# Patient Record
Sex: Female | Born: 1940
Health system: Southern US, Community
[De-identification: ages and names within clinical notes are randomized; demographics above are authoritative.]

## PROBLEM LIST (undated history)

## (undated) DIAGNOSIS — G4730 Sleep apnea, unspecified: Secondary | ICD-10-CM

## (undated) DIAGNOSIS — L4050 Arthropathic psoriasis, unspecified: Secondary | ICD-10-CM

## (undated) DIAGNOSIS — E7800 Pure hypercholesterolemia, unspecified: Secondary | ICD-10-CM

## (undated) DIAGNOSIS — T884XXA Failed or difficult intubation, initial encounter: Secondary | ICD-10-CM

## (undated) DIAGNOSIS — E079 Disorder of thyroid, unspecified: Secondary | ICD-10-CM

## (undated) DIAGNOSIS — Z9889 Other specified postprocedural states: Secondary | ICD-10-CM

## (undated) DIAGNOSIS — T8859XA Other complications of anesthesia, initial encounter: Secondary | ICD-10-CM

## (undated) DIAGNOSIS — Z96659 Presence of unspecified artificial knee joint: Secondary | ICD-10-CM

## (undated) HISTORY — PX: SPINE SURGERY: SHX786

## (undated) HISTORY — PX: TONSILLECTOMY: SUR1361

## (undated) HISTORY — DX: Pure hypercholesterolemia, unspecified: E78.00

## (undated) HISTORY — PX: FOOT SURGERY: SHX648

## (undated) HISTORY — PX: CARDIAC CATHETERIZATION: SHX172

## (undated) HISTORY — PX: CATARACT EXTRACTION W/ INTRAOCULAR LENS IMPLANT: SHX1309

## (undated) HISTORY — DX: Arthropathic psoriasis, unspecified: L40.50

## (undated) HISTORY — DX: Disorder of thyroid, unspecified: E07.9

## (undated) HISTORY — DX: Presence of unspecified artificial knee joint: Z96.659

## (undated) HISTORY — DX: Other specified postprocedural states: Z98.890

## (undated) HISTORY — PX: TOTAL KNEE ARTHROPLASTY: SHX125

## (undated) HISTORY — DX: Sleep apnea, unspecified: G47.30

---
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
## 1988-10-27 DIAGNOSIS — L4050 Arthropathic psoriasis, unspecified: Secondary | ICD-10-CM

## 1988-10-27 HISTORY — DX: Arthropathic psoriasis, unspecified: L40.50

## 1997-12-20 ENCOUNTER — Ambulatory Visit (HOSPITAL_COMMUNITY): Admission: RE | Admit: 1997-12-20 | Discharge: 1997-12-20 | Payer: Self-pay | Admitting: Obstetrics and Gynecology

## 1998-12-25 ENCOUNTER — Other Ambulatory Visit: Admission: RE | Admit: 1998-12-25 | Discharge: 1998-12-25 | Payer: Self-pay | Admitting: Obstetrics and Gynecology

## 1999-10-28 DIAGNOSIS — Z96659 Presence of unspecified artificial knee joint: Secondary | ICD-10-CM

## 1999-10-28 HISTORY — DX: Presence of unspecified artificial knee joint: Z96.659

## 2000-07-06 ENCOUNTER — Other Ambulatory Visit: Admission: RE | Admit: 2000-07-06 | Discharge: 2000-07-06 | Payer: Self-pay | Admitting: Obstetrics and Gynecology

## 2000-10-15 ENCOUNTER — Ambulatory Visit (HOSPITAL_COMMUNITY): Admission: RE | Admit: 2000-10-15 | Discharge: 2000-10-15 | Payer: Self-pay | Admitting: *Deleted

## 2001-11-01 ENCOUNTER — Other Ambulatory Visit: Admission: RE | Admit: 2001-11-01 | Discharge: 2001-11-01 | Payer: Self-pay | Admitting: Obstetrics and Gynecology

## 2002-10-27 DIAGNOSIS — G4730 Sleep apnea, unspecified: Secondary | ICD-10-CM

## 2002-10-27 HISTORY — DX: Sleep apnea, unspecified: G47.30

## 2007-03-04 DIAGNOSIS — K1120 Sialoadenitis, unspecified: Secondary | ICD-10-CM | POA: Insufficient documentation

## 2010-06-20 DIAGNOSIS — G4733 Obstructive sleep apnea (adult) (pediatric): Secondary | ICD-10-CM | POA: Insufficient documentation

## 2010-06-20 DIAGNOSIS — L4050 Arthropathic psoriasis, unspecified: Secondary | ICD-10-CM | POA: Insufficient documentation

## 2010-06-20 DIAGNOSIS — R6510 Systemic inflammatory response syndrome (SIRS) of non-infectious origin without acute organ dysfunction: Secondary | ICD-10-CM | POA: Insufficient documentation

## 2010-06-20 DIAGNOSIS — L0291 Cutaneous abscess, unspecified: Secondary | ICD-10-CM | POA: Insufficient documentation

## 2010-06-20 DIAGNOSIS — G4730 Sleep apnea, unspecified: Secondary | ICD-10-CM | POA: Insufficient documentation

## 2010-06-20 DIAGNOSIS — E039 Hypothyroidism, unspecified: Secondary | ICD-10-CM | POA: Insufficient documentation

## 2011-07-19 DIAGNOSIS — M47812 Spondylosis without myelopathy or radiculopathy, cervical region: Secondary | ICD-10-CM | POA: Insufficient documentation

## 2011-08-07 DIAGNOSIS — M47817 Spondylosis without myelopathy or radiculopathy, lumbosacral region: Secondary | ICD-10-CM | POA: Insufficient documentation

## 2012-10-04 DIAGNOSIS — M79672 Pain in left foot: Secondary | ICD-10-CM | POA: Insufficient documentation

## 2013-11-02 DIAGNOSIS — H2510 Age-related nuclear cataract, unspecified eye: Secondary | ICD-10-CM | POA: Diagnosis not present

## 2013-12-29 DIAGNOSIS — H18519 Endothelial corneal dystrophy, unspecified eye: Secondary | ICD-10-CM | POA: Diagnosis not present

## 2013-12-29 DIAGNOSIS — M069 Rheumatoid arthritis, unspecified: Secondary | ICD-10-CM | POA: Diagnosis not present

## 2013-12-29 DIAGNOSIS — H35379 Puckering of macula, unspecified eye: Secondary | ICD-10-CM | POA: Diagnosis not present

## 2013-12-29 DIAGNOSIS — H2510 Age-related nuclear cataract, unspecified eye: Secondary | ICD-10-CM | POA: Diagnosis not present

## 2014-01-16 DIAGNOSIS — B351 Tinea unguium: Secondary | ICD-10-CM | POA: Diagnosis not present

## 2014-01-16 DIAGNOSIS — I70209 Unspecified atherosclerosis of native arteries of extremities, unspecified extremity: Secondary | ICD-10-CM | POA: Diagnosis not present

## 2014-01-16 DIAGNOSIS — M79609 Pain in unspecified limb: Secondary | ICD-10-CM | POA: Diagnosis not present

## 2014-01-16 DIAGNOSIS — L97509 Non-pressure chronic ulcer of other part of unspecified foot with unspecified severity: Secondary | ICD-10-CM | POA: Diagnosis not present

## 2014-02-16 DIAGNOSIS — M069 Rheumatoid arthritis, unspecified: Secondary | ICD-10-CM | POA: Diagnosis not present

## 2014-02-16 DIAGNOSIS — H18519 Endothelial corneal dystrophy, unspecified eye: Secondary | ICD-10-CM | POA: Diagnosis not present

## 2014-02-16 DIAGNOSIS — M899 Disorder of bone, unspecified: Secondary | ICD-10-CM | POA: Diagnosis not present

## 2014-02-16 DIAGNOSIS — H2510 Age-related nuclear cataract, unspecified eye: Secondary | ICD-10-CM | POA: Diagnosis not present

## 2014-02-16 DIAGNOSIS — M130 Polyarthritis, unspecified: Secondary | ICD-10-CM | POA: Diagnosis not present

## 2014-02-16 DIAGNOSIS — M949 Disorder of cartilage, unspecified: Secondary | ICD-10-CM | POA: Diagnosis not present

## 2014-02-16 DIAGNOSIS — Z79899 Other long term (current) drug therapy: Secondary | ICD-10-CM | POA: Diagnosis not present

## 2014-03-22 DIAGNOSIS — R0789 Other chest pain: Secondary | ICD-10-CM | POA: Diagnosis not present

## 2014-03-22 DIAGNOSIS — R002 Palpitations: Secondary | ICD-10-CM | POA: Diagnosis not present

## 2014-03-22 DIAGNOSIS — E039 Hypothyroidism, unspecified: Secondary | ICD-10-CM | POA: Diagnosis not present

## 2014-04-05 DIAGNOSIS — M47812 Spondylosis without myelopathy or radiculopathy, cervical region: Secondary | ICD-10-CM | POA: Diagnosis not present

## 2014-04-05 DIAGNOSIS — E039 Hypothyroidism, unspecified: Secondary | ICD-10-CM | POA: Diagnosis not present

## 2014-04-05 DIAGNOSIS — L4050 Arthropathic psoriasis, unspecified: Secondary | ICD-10-CM | POA: Diagnosis not present

## 2014-04-12 DIAGNOSIS — I10 Essential (primary) hypertension: Secondary | ICD-10-CM | POA: Diagnosis not present

## 2014-04-12 DIAGNOSIS — M069 Rheumatoid arthritis, unspecified: Secondary | ICD-10-CM | POA: Diagnosis not present

## 2014-04-12 DIAGNOSIS — G4733 Obstructive sleep apnea (adult) (pediatric): Secondary | ICD-10-CM | POA: Diagnosis not present

## 2014-04-26 DIAGNOSIS — Z981 Arthrodesis status: Secondary | ICD-10-CM | POA: Diagnosis not present

## 2014-04-26 DIAGNOSIS — M47812 Spondylosis without myelopathy or radiculopathy, cervical region: Secondary | ICD-10-CM | POA: Diagnosis not present

## 2014-04-26 DIAGNOSIS — M4802 Spinal stenosis, cervical region: Secondary | ICD-10-CM | POA: Diagnosis not present

## 2014-05-11 DIAGNOSIS — H02839 Dermatochalasis of unspecified eye, unspecified eyelid: Secondary | ICD-10-CM | POA: Diagnosis not present

## 2014-05-11 DIAGNOSIS — H02409 Unspecified ptosis of unspecified eyelid: Secondary | ICD-10-CM | POA: Diagnosis not present

## 2014-05-18 DIAGNOSIS — M79609 Pain in unspecified limb: Secondary | ICD-10-CM | POA: Diagnosis not present

## 2014-05-18 DIAGNOSIS — I70209 Unspecified atherosclerosis of native arteries of extremities, unspecified extremity: Secondary | ICD-10-CM | POA: Diagnosis not present

## 2014-05-18 DIAGNOSIS — M2040 Other hammer toe(s) (acquired), unspecified foot: Secondary | ICD-10-CM | POA: Diagnosis not present

## 2014-05-18 DIAGNOSIS — B351 Tinea unguium: Secondary | ICD-10-CM | POA: Diagnosis not present

## 2014-08-09 DIAGNOSIS — Z01818 Encounter for other preprocedural examination: Secondary | ICD-10-CM | POA: Diagnosis not present

## 2014-08-15 DIAGNOSIS — M47892 Other spondylosis, cervical region: Secondary | ICD-10-CM | POA: Diagnosis not present

## 2014-08-15 DIAGNOSIS — K219 Gastro-esophageal reflux disease without esophagitis: Secondary | ICD-10-CM | POA: Diagnosis not present

## 2014-08-15 DIAGNOSIS — H02834 Dermatochalasis of left upper eyelid: Secondary | ICD-10-CM | POA: Diagnosis not present

## 2014-08-15 DIAGNOSIS — H02403 Unspecified ptosis of bilateral eyelids: Secondary | ICD-10-CM | POA: Diagnosis not present

## 2014-08-15 DIAGNOSIS — E039 Hypothyroidism, unspecified: Secondary | ICD-10-CM | POA: Diagnosis not present

## 2014-08-15 DIAGNOSIS — E785 Hyperlipidemia, unspecified: Secondary | ICD-10-CM | POA: Diagnosis not present

## 2014-08-15 DIAGNOSIS — M8580 Other specified disorders of bone density and structure, unspecified site: Secondary | ICD-10-CM | POA: Diagnosis not present

## 2014-08-15 DIAGNOSIS — G4730 Sleep apnea, unspecified: Secondary | ICD-10-CM | POA: Diagnosis not present

## 2014-08-15 DIAGNOSIS — H02831 Dermatochalasis of right upper eyelid: Secondary | ICD-10-CM | POA: Diagnosis not present

## 2014-08-31 DIAGNOSIS — H02831 Dermatochalasis of right upper eyelid: Secondary | ICD-10-CM | POA: Insufficient documentation

## 2014-08-31 DIAGNOSIS — H02834 Dermatochalasis of left upper eyelid: Secondary | ICD-10-CM | POA: Insufficient documentation

## 2014-09-25 DIAGNOSIS — M79674 Pain in right toe(s): Secondary | ICD-10-CM | POA: Diagnosis not present

## 2014-09-25 DIAGNOSIS — Z803 Family history of malignant neoplasm of breast: Secondary | ICD-10-CM | POA: Diagnosis not present

## 2014-09-25 DIAGNOSIS — B351 Tinea unguium: Secondary | ICD-10-CM | POA: Diagnosis not present

## 2014-09-25 DIAGNOSIS — M79675 Pain in left toe(s): Secondary | ICD-10-CM | POA: Diagnosis not present

## 2014-09-25 DIAGNOSIS — I70203 Unspecified atherosclerosis of native arteries of extremities, bilateral legs: Secondary | ICD-10-CM | POA: Diagnosis not present

## 2014-09-25 DIAGNOSIS — Z1231 Encounter for screening mammogram for malignant neoplasm of breast: Secondary | ICD-10-CM | POA: Diagnosis not present

## 2014-10-11 DIAGNOSIS — H1851 Endothelial corneal dystrophy: Secondary | ICD-10-CM | POA: Diagnosis not present

## 2014-10-11 DIAGNOSIS — Z79899 Other long term (current) drug therapy: Secondary | ICD-10-CM | POA: Diagnosis not present

## 2014-10-11 DIAGNOSIS — H269 Unspecified cataract: Secondary | ICD-10-CM | POA: Diagnosis not present

## 2014-10-11 DIAGNOSIS — H35371 Puckering of macula, right eye: Secondary | ICD-10-CM | POA: Diagnosis not present

## 2014-10-11 DIAGNOSIS — Z961 Presence of intraocular lens: Secondary | ICD-10-CM | POA: Diagnosis not present

## 2014-10-12 DIAGNOSIS — G4733 Obstructive sleep apnea (adult) (pediatric): Secondary | ICD-10-CM | POA: Diagnosis not present

## 2014-10-12 DIAGNOSIS — I10 Essential (primary) hypertension: Secondary | ICD-10-CM | POA: Diagnosis not present

## 2014-10-12 DIAGNOSIS — M0579 Rheumatoid arthritis with rheumatoid factor of multiple sites without organ or systems involvement: Secondary | ICD-10-CM | POA: Diagnosis not present

## 2014-11-09 DIAGNOSIS — Z23 Encounter for immunization: Secondary | ICD-10-CM | POA: Diagnosis not present

## 2014-11-23 DIAGNOSIS — Z79899 Other long term (current) drug therapy: Secondary | ICD-10-CM | POA: Diagnosis not present

## 2014-11-23 DIAGNOSIS — M4322 Fusion of spine, cervical region: Secondary | ICD-10-CM | POA: Diagnosis not present

## 2014-11-23 DIAGNOSIS — M542 Cervicalgia: Secondary | ICD-10-CM | POA: Diagnosis not present

## 2014-11-23 DIAGNOSIS — L4052 Psoriatic arthritis mutilans: Secondary | ICD-10-CM | POA: Diagnosis not present

## 2014-11-23 DIAGNOSIS — M47812 Spondylosis without myelopathy or radiculopathy, cervical region: Secondary | ICD-10-CM | POA: Diagnosis not present

## 2014-11-23 DIAGNOSIS — Z23 Encounter for immunization: Secondary | ICD-10-CM | POA: Diagnosis not present

## 2014-11-23 DIAGNOSIS — M549 Dorsalgia, unspecified: Secondary | ICD-10-CM | POA: Diagnosis not present

## 2014-12-19 DIAGNOSIS — M47812 Spondylosis without myelopathy or radiculopathy, cervical region: Secondary | ICD-10-CM | POA: Diagnosis not present

## 2014-12-21 DIAGNOSIS — M47812 Spondylosis without myelopathy or radiculopathy, cervical region: Secondary | ICD-10-CM | POA: Diagnosis not present

## 2014-12-26 DIAGNOSIS — M0579 Rheumatoid arthritis with rheumatoid factor of multiple sites without organ or systems involvement: Secondary | ICD-10-CM | POA: Diagnosis not present

## 2014-12-26 DIAGNOSIS — I10 Essential (primary) hypertension: Secondary | ICD-10-CM | POA: Diagnosis not present

## 2014-12-26 DIAGNOSIS — G4733 Obstructive sleep apnea (adult) (pediatric): Secondary | ICD-10-CM | POA: Diagnosis not present

## 2014-12-28 DIAGNOSIS — M47812 Spondylosis without myelopathy or radiculopathy, cervical region: Secondary | ICD-10-CM | POA: Diagnosis not present

## 2015-01-01 DIAGNOSIS — M47812 Spondylosis without myelopathy or radiculopathy, cervical region: Secondary | ICD-10-CM | POA: Diagnosis not present

## 2015-01-01 DIAGNOSIS — I70203 Unspecified atherosclerosis of native arteries of extremities, bilateral legs: Secondary | ICD-10-CM | POA: Diagnosis not present

## 2015-01-01 DIAGNOSIS — B351 Tinea unguium: Secondary | ICD-10-CM | POA: Diagnosis not present

## 2015-01-03 DIAGNOSIS — M47812 Spondylosis without myelopathy or radiculopathy, cervical region: Secondary | ICD-10-CM | POA: Diagnosis not present

## 2015-01-09 DIAGNOSIS — M47812 Spondylosis without myelopathy or radiculopathy, cervical region: Secondary | ICD-10-CM | POA: Diagnosis not present

## 2015-01-12 DIAGNOSIS — M47812 Spondylosis without myelopathy or radiculopathy, cervical region: Secondary | ICD-10-CM | POA: Diagnosis not present

## 2015-04-19 DIAGNOSIS — M069 Rheumatoid arthritis, unspecified: Secondary | ICD-10-CM | POA: Diagnosis not present

## 2015-04-19 DIAGNOSIS — M2042 Other hammer toe(s) (acquired), left foot: Secondary | ICD-10-CM | POA: Diagnosis not present

## 2015-04-19 DIAGNOSIS — I70203 Unspecified atherosclerosis of native arteries of extremities, bilateral legs: Secondary | ICD-10-CM | POA: Diagnosis not present

## 2015-04-19 DIAGNOSIS — Z719 Counseling, unspecified: Secondary | ICD-10-CM | POA: Diagnosis not present

## 2015-04-19 DIAGNOSIS — E663 Overweight: Secondary | ICD-10-CM | POA: Diagnosis not present

## 2015-04-19 DIAGNOSIS — M79675 Pain in left toe(s): Secondary | ICD-10-CM | POA: Diagnosis not present

## 2015-04-19 DIAGNOSIS — B351 Tinea unguium: Secondary | ICD-10-CM | POA: Diagnosis not present

## 2015-04-23 DIAGNOSIS — E039 Hypothyroidism, unspecified: Secondary | ICD-10-CM | POA: Diagnosis not present

## 2015-04-23 DIAGNOSIS — E785 Hyperlipidemia, unspecified: Secondary | ICD-10-CM | POA: Diagnosis not present

## 2015-04-23 DIAGNOSIS — E559 Vitamin D deficiency, unspecified: Secondary | ICD-10-CM | POA: Diagnosis not present

## 2015-04-23 DIAGNOSIS — M069 Rheumatoid arthritis, unspecified: Secondary | ICD-10-CM | POA: Diagnosis not present

## 2015-05-01 DIAGNOSIS — M8580 Other specified disorders of bone density and structure, unspecified site: Secondary | ICD-10-CM | POA: Diagnosis not present

## 2015-05-01 DIAGNOSIS — L4052 Psoriatic arthritis mutilans: Secondary | ICD-10-CM | POA: Diagnosis not present

## 2015-05-01 DIAGNOSIS — Z79899 Other long term (current) drug therapy: Secondary | ICD-10-CM | POA: Diagnosis not present

## 2015-05-03 DIAGNOSIS — M9080 Osteopathy in diseases classified elsewhere, unspecified site: Secondary | ICD-10-CM | POA: Diagnosis not present

## 2015-05-03 DIAGNOSIS — M549 Dorsalgia, unspecified: Secondary | ICD-10-CM | POA: Diagnosis not present

## 2015-05-03 DIAGNOSIS — G8929 Other chronic pain: Secondary | ICD-10-CM | POA: Diagnosis not present

## 2015-05-03 DIAGNOSIS — Z719 Counseling, unspecified: Secondary | ICD-10-CM | POA: Diagnosis not present

## 2015-05-03 DIAGNOSIS — M069 Rheumatoid arthritis, unspecified: Secondary | ICD-10-CM | POA: Diagnosis not present

## 2015-05-03 DIAGNOSIS — E889 Metabolic disorder, unspecified: Secondary | ICD-10-CM | POA: Diagnosis not present

## 2015-05-03 DIAGNOSIS — L309 Dermatitis, unspecified: Secondary | ICD-10-CM | POA: Diagnosis not present

## 2015-05-03 DIAGNOSIS — E785 Hyperlipidemia, unspecified: Secondary | ICD-10-CM | POA: Diagnosis not present

## 2015-05-03 DIAGNOSIS — E039 Hypothyroidism, unspecified: Secondary | ICD-10-CM | POA: Diagnosis not present

## 2015-05-22 DIAGNOSIS — L4050 Arthropathic psoriasis, unspecified: Secondary | ICD-10-CM | POA: Diagnosis not present

## 2015-06-20 DIAGNOSIS — Z780 Asymptomatic menopausal state: Secondary | ICD-10-CM | POA: Diagnosis not present

## 2015-09-05 DIAGNOSIS — I70203 Unspecified atherosclerosis of native arteries of extremities, bilateral legs: Secondary | ICD-10-CM | POA: Diagnosis not present

## 2015-09-05 DIAGNOSIS — B351 Tinea unguium: Secondary | ICD-10-CM | POA: Diagnosis not present

## 2015-09-05 DIAGNOSIS — M2042 Other hammer toe(s) (acquired), left foot: Secondary | ICD-10-CM | POA: Diagnosis not present

## 2015-09-05 DIAGNOSIS — L852 Keratosis punctata (palmaris et plantaris): Secondary | ICD-10-CM | POA: Diagnosis not present

## 2015-09-27 DIAGNOSIS — N6489 Other specified disorders of breast: Secondary | ICD-10-CM | POA: Diagnosis not present

## 2015-09-27 DIAGNOSIS — Z1231 Encounter for screening mammogram for malignant neoplasm of breast: Secondary | ICD-10-CM | POA: Diagnosis not present

## 2015-09-27 DIAGNOSIS — Z803 Family history of malignant neoplasm of breast: Secondary | ICD-10-CM | POA: Diagnosis not present

## 2015-10-08 DIAGNOSIS — L4050 Arthropathic psoriasis, unspecified: Secondary | ICD-10-CM | POA: Diagnosis not present

## 2015-10-10 DIAGNOSIS — N6489 Other specified disorders of breast: Secondary | ICD-10-CM | POA: Diagnosis not present

## 2015-10-10 DIAGNOSIS — Z803 Family history of malignant neoplasm of breast: Secondary | ICD-10-CM | POA: Diagnosis not present

## 2015-10-17 DIAGNOSIS — Z23 Encounter for immunization: Secondary | ICD-10-CM | POA: Diagnosis not present

## 2016-01-21 ENCOUNTER — Ambulatory Visit (INDEPENDENT_AMBULATORY_CARE_PROVIDER_SITE_OTHER): Payer: Medicare Other | Admitting: Podiatry

## 2016-01-21 VITALS — BP 139/88 | HR 90 | Resp 16

## 2016-01-21 DIAGNOSIS — B351 Tinea unguium: Secondary | ICD-10-CM

## 2016-01-21 DIAGNOSIS — L84 Corns and callosities: Secondary | ICD-10-CM | POA: Diagnosis not present

## 2016-01-21 DIAGNOSIS — M79673 Pain in unspecified foot: Secondary | ICD-10-CM

## 2016-01-21 DIAGNOSIS — L4050 Arthropathic psoriasis, unspecified: Secondary | ICD-10-CM | POA: Diagnosis not present

## 2016-01-21 DIAGNOSIS — M2040 Other hammer toe(s) (acquired), unspecified foot: Secondary | ICD-10-CM

## 2016-01-21 NOTE — Progress Notes (Signed)
   Subjective:    Patient ID: Sydney Reid, female    DOB: 07/31/1941, 75 y.o.   MRN: [id number]  HPI Pt presents with PSA arthritic condition causing deformities in her extremeties. She has corns and callous as a result of deformities, her nails are also difficult to cut   Review of Systems  Neurological: Positive for dizziness and light-headedness.  All other systems reviewed and are negative.      Objective:   Physical Exam        Assessment & Plan:

## 2016-01-22 NOTE — Progress Notes (Signed)
Subjective:     Patient ID: Sydney Reid, female   DOB: Oct 02, 1941, 75 y.o.   MRN: [id number]  HPI patient presents with severe nail disease 1-5 both feet and lesions on both feet secondary to severe structural deformity and long-term psoriatic arthritis   Review of Systems  All other systems reviewed and are negative.      Objective:   Physical Exam  Constitutional: She is oriented to person, place, and time.  Cardiovascular: Intact distal pulses.   Musculoskeletal: Normal range of motion.  Neurological: She is oriented to person, place, and time.  Skin: Skin is warm and dry.  Nursing note and vitals reviewed.  neurovascular status was found to be intact with patient having numerous incisions from previous digital fusions and other procedures. There is a elevated hallux left with keratotic lesion at the interphalangeal joint that's painful and lesions on the lesser digits along with nail disease 1-5 both feet that are thickened at this time. Digital perfusion is normal and patient well oriented 3     Assessment:     Severe structural abnormalities with nail disease keratotic lesion and digital deformity    Plan:     H&P and all conditions reviewed with patient. Debrided nailbeds on both feet and lesions with no iatrogenic bleeding noted and reappoint for routine care

## 2016-02-27 DIAGNOSIS — R490 Dysphonia: Secondary | ICD-10-CM | POA: Diagnosis not present

## 2016-02-27 DIAGNOSIS — H1851 Endothelial corneal dystrophy: Secondary | ICD-10-CM | POA: Diagnosis not present

## 2016-02-27 DIAGNOSIS — H02831 Dermatochalasis of right upper eyelid: Secondary | ICD-10-CM | POA: Diagnosis not present

## 2016-02-27 DIAGNOSIS — Z961 Presence of intraocular lens: Secondary | ICD-10-CM | POA: Diagnosis not present

## 2016-02-27 DIAGNOSIS — H26492 Other secondary cataract, left eye: Secondary | ICD-10-CM | POA: Diagnosis not present

## 2016-02-27 DIAGNOSIS — H02834 Dermatochalasis of left upper eyelid: Secondary | ICD-10-CM | POA: Diagnosis not present

## 2016-02-27 DIAGNOSIS — H532 Diplopia: Secondary | ICD-10-CM | POA: Diagnosis not present

## 2016-04-01 DIAGNOSIS — E039 Hypothyroidism, unspecified: Secondary | ICD-10-CM | POA: Diagnosis not present

## 2016-04-01 DIAGNOSIS — L4050 Arthropathic psoriasis, unspecified: Secondary | ICD-10-CM | POA: Diagnosis not present

## 2016-04-01 DIAGNOSIS — G4733 Obstructive sleep apnea (adult) (pediatric): Secondary | ICD-10-CM | POA: Diagnosis not present

## 2016-04-01 DIAGNOSIS — E7800 Pure hypercholesterolemia, unspecified: Secondary | ICD-10-CM | POA: Diagnosis not present

## 2016-04-02 DIAGNOSIS — L4050 Arthropathic psoriasis, unspecified: Secondary | ICD-10-CM | POA: Diagnosis not present

## 2016-04-02 DIAGNOSIS — E039 Hypothyroidism, unspecified: Secondary | ICD-10-CM | POA: Diagnosis not present

## 2016-04-02 DIAGNOSIS — E559 Vitamin D deficiency, unspecified: Secondary | ICD-10-CM | POA: Diagnosis not present

## 2016-04-02 DIAGNOSIS — E7800 Pure hypercholesterolemia, unspecified: Secondary | ICD-10-CM | POA: Diagnosis not present

## 2016-04-02 LAB — CBC AND DIFFERENTIAL
HCT: 49 % — AB (ref 36–46)
Hemoglobin: 15.7 g/dL (ref 12.0–16.0)
Platelets: 223 10*3/uL (ref 150–399)
WBC: 6 10^3/mL

## 2016-04-02 LAB — HEPATIC FUNCTION PANEL
ALT: 29 U/L (ref 7–35)
AST: 25 U/L (ref 13–35)
Alkaline Phosphatase: 71 U/L (ref 25–125)
Bilirubin, Total: 0.8 mg/dL

## 2016-04-02 LAB — LIPID PANEL
Cholesterol: 216 mg/dL — AB (ref 0–200)
HDL: 48 mg/dL (ref 35–70)
LDL Cholesterol: 150 mg/dL
Triglycerides: 92 mg/dL (ref 40–160)

## 2016-04-02 LAB — BASIC METABOLIC PANEL
BUN: 17 mg/dL (ref 4–21)
Creatinine: 1 mg/dL (ref <=1.1)
Glucose: 93 mg/dL
Potassium: 4.3 mmol/L (ref 3.4–5.3)
Sodium: 138 mmol/L (ref 137–147)

## 2016-04-02 LAB — TSH: TSH: 2.22 u[IU]/mL (ref <=5.90)

## 2016-05-02 ENCOUNTER — Ambulatory Visit (INDEPENDENT_AMBULATORY_CARE_PROVIDER_SITE_OTHER): Payer: Medicare Other | Admitting: Internal Medicine

## 2016-05-02 ENCOUNTER — Encounter: Payer: Self-pay | Admitting: Internal Medicine

## 2016-05-02 ENCOUNTER — Telehealth: Payer: Self-pay

## 2016-05-02 VITALS — BP 120/80 | HR 92 | Temp 98.2°F | Resp 20 | Ht 65.0 in | Wt 158.8 lb

## 2016-05-02 DIAGNOSIS — M8580 Other specified disorders of bone density and structure, unspecified site: Secondary | ICD-10-CM | POA: Diagnosis not present

## 2016-05-02 DIAGNOSIS — E034 Atrophy of thyroid (acquired): Secondary | ICD-10-CM

## 2016-05-02 DIAGNOSIS — Z79899 Other long term (current) drug therapy: Secondary | ICD-10-CM | POA: Diagnosis not present

## 2016-05-02 DIAGNOSIS — R42 Dizziness and giddiness: Secondary | ICD-10-CM | POA: Diagnosis not present

## 2016-05-02 DIAGNOSIS — E785 Hyperlipidemia, unspecified: Secondary | ICD-10-CM

## 2016-05-02 DIAGNOSIS — G4733 Obstructive sleep apnea (adult) (pediatric): Secondary | ICD-10-CM | POA: Diagnosis not present

## 2016-05-02 DIAGNOSIS — R011 Cardiac murmur, unspecified: Secondary | ICD-10-CM

## 2016-05-02 DIAGNOSIS — E038 Other specified hypothyroidism: Secondary | ICD-10-CM | POA: Diagnosis not present

## 2016-05-02 DIAGNOSIS — L4052 Psoriatic arthritis mutilans: Secondary | ICD-10-CM

## 2016-05-02 MED ORDER — LEVOTHYROXINE SODIUM 88 MCG PO TABS: 88.0000 ug | ORAL_TABLET | Freq: Every day | ORAL | Status: DC

## 2016-05-02 MED ORDER — MELOXICAM 15 MG PO TABS: 15.0000 mg | ORAL_TABLET | Freq: Every day | ORAL | Status: DC

## 2016-05-02 NOTE — Telephone Encounter (Signed)
Patient called requesting mail order rx's for Mobic and Synthroid. [id number] sent electronically

## 2016-05-02 NOTE — Progress Notes (Signed)
Patient ID: Sydney Reid, female   DOB: Jul 24, 1941, 75 y.o.   MRN: [id number]    Location:  [facility] Place of Service: OFFICE    Advanced Directive information Does patient have an advance directive?: Yes, Type of Advance Directive: Healthcare Power of Attorney, Does patient want to make changes to advanced directive?: No - Patient declined  Chief Complaint  Patient presents with  . Establish Care    moved from [place]    HPI:  75 yo female seen today as a new pt. She reports increased dizziness last several weeks. She has hx positional vertigo and was tx with vestibular rehab in past. No ear pain/pressure, sinus pressure, sore throat, rhinorrhea. Occasional nasal congestion. Nothing tried OTC yet.  Hyperlipidemia - reports hives/myalgias with statins. LDL 150; T chol 216; HDL 48. She takes ASA daily  Hypothyroidism - stable on levothroid. TSH 2.22  OSA - dx in 2004. Last sleep study in 2004. She uses BiPAP 12/10 setting qHS. She was followed by sleep specialist in [place], [place] prior to move. She is now seeing Dr Shelia Media for sleep medicine  Allergic urticaria/dermatitis/hx psoriasis - takes Enbrel injections  Vitamin D deficiency - takes Vit D 2 times per month. Vit D 25OH level 31.9  Osteopenia - last DXA in Dec 2016 and showed some improvement. Takes weekly Vit D  Psoriatic arthritis - takes weekly enbrel injections and daily mobic. She has severe hand/finger joint deformities. Generalized joint pain. Takes norco prn. Followed by rheumatology Dr Dossie Der at [facility].   Past Medical History  Diagnosis Date  . High cholesterol   . History of tonsillectomy and adenoidectomy 1946  . H/O total knee replacement 2001    Right  . Sleep apnea with use of continuous positive airway pressure (CPAP) 2004  . Thyroid disease     Past Surgical History  Procedure Laterality Date  . Total knee arthroplasty Left   . Spine surgery    . Foot surgery      fused arch in left foot     Patient Care Team: No Pcp Per Patient as PCP - General (General Practice) Tomma Rakers, MD as Referring Physician (Ophthalmology)  Social History   Social History  . Marital Status: Married    Spouse Name: N/A  . Number of Children: N/A  . Years of Education: N/A   Occupational History  . Not on file.   Social History Main Topics  . Smoking status: Former Smoker -- 2.00 packs/day for 17 years    Types: Cigarettes  . Smokeless tobacco: Never Used  . Alcohol Use: 1.2 - 4.2 oz/week    2-7 Standard drinks or equivalent per week  . Drug Use: No  . Sexual Activity: Not on file   Other Topics Concern  . Not on file   Social History Narrative   Diet? Normal      Do you drink/eat things with caffeine? Yes, 1 mt. Dew per day      Marital status?              m                      What year were you married? 1988      Do you live in a house, apartment, assisted living, condo, trailer, etc.? Townhouse      Is it one or more stories? 2 story      How many persons live in your home?  2      Do you have any pets in your home? (please list) no      Current or past profession: Is manager      Do you exercise?     No (did it for 50 years)                       Type & how often?      Do you have a living will? yes      Do you have a DNR form?        yes                          If not, do you want to discuss one?      Do you have signed POA/HPOA for forms?            reports that she has quit smoking. Her smoking use included Cigarettes. She has a 34 pack-year smoking history. She has never used smokeless tobacco. She reports that she drinks about 1.2 - 4.2 oz of alcohol per week. She reports that she does not use illicit drugs.  Family History  Problem Relation Age of Onset  . Heart disease Sister    Family Status  Relation Status Death Age  . Father Deceased 21    Heart & Polycystic kidney  . Mother Deceased 1    ALS  . Sister Alive      There is no  immunization history on file for this patient.  Allergies  Allergen Reactions  . Codeine Nausea And Vomiting    Medications: Patient's Medications  New Prescriptions   No medications on file  Previous Medications   ASPIRIN 81 MG TABLET    Take 81 mg by mouth daily.   ETANERCEPT (ENBREL) 50 MG/ML INJECTION    Inject 50 mg into the skin once a week.   HYDROCODONE-ACETAMINOPHEN (NORCO/VICODIN) 5-325 MG TABLET    Take 1 tablet by mouth every 6 (six) hours as needed for moderate pain.   LEVOTHYROXINE (SYNTHROID, LEVOTHROID) 88 MCG TABLET    Take 88 mcg by mouth daily before breakfast.   MELOXICAM (MOBIC) 15 MG TABLET    Take 15 mg by mouth daily.   VITAMIN D, ERGOCALCIFEROL, (DRISDOL) 50000 UNITS CAPS CAPSULE    Take 50,000 Units by mouth every 7 (seven) days.  Modified Medications   No medications on file  Discontinued Medications   No medications on file    Review of Systems  Constitutional: Positive for diaphoresis.  HENT: Positive for tinnitus.   Respiratory: Positive for shortness of breath.   Cardiovascular: Positive for palpitations.  Musculoskeletal:       Deformities  Skin: Positive for color change.       itchy  Neurological: Positive for dizziness (with loss of balance).  All other systems reviewed and are negative.   Filed Vitals:   05/02/16 0839  BP: 120/80  Pulse: 92  Temp: 98.2 F (36.8 C)  TempSrc: Oral  Resp: 20  Height: 5\' 5"  (1.651 m)  Weight: 158 lb 12.8 oz (72.031 kg)  SpO2: 97%   Body mass index is 26.43 kg/(m^2).  Physical Exam  Constitutional: She is oriented to person, place, and time. She appears well-developed and well-nourished.  HENT:  Mouth/Throat: Oropharynx is clear and moist. No oropharyngeal exudate.  Right TM dull; left TM appears nml  Eyes: Pupils are equal, round, and reactive to  light. No scleral icterus.  Neck: Neck supple. Carotid bruit is not present. No tracheal deviation present. No thyromegaly present.  Cardiovascular:  Normal rate, regular rhythm and intact distal pulses.  Exam reveals no gallop and no friction rub.   Murmur (1/6 SEM) heard. No LE edema b/l. no calf TTP.   Pulmonary/Chest: Effort normal and breath sounds normal. No stridor. No respiratory distress. She has no wheezes. She has no rales.  Abdominal: Soft. Bowel sounds are normal. She exhibits no distension and no mass. There is no hepatomegaly. There is no tenderness. There is no rebound and no guarding.  Musculoskeletal: She exhibits edema and tenderness.  Joint deformities in hand/fingers; upper midline thoracic incisional scar  Lymphadenopathy:    She has no cervical adenopathy.  Neurological: She is alert and oriented to person, place, and time. She has normal reflexes.  Skin: Skin is warm and dry. No rash noted.  Psychiatric: She has a normal mood and affect. Her behavior is normal. Judgment and thought content normal.     Labs reviewed: Office Visit on 05/02/2016  Component Date Value Ref Range Status  . Hemoglobin 04/02/2016 15.7  12.0 - 16.0 g/dL Final  . HCT 04/02/2016 49* 36 - 46 % Final  . Platelets 04/02/2016 223  150 - 399 K/L Final  . WBC 04/02/2016 6.0   Final  . Glucose 04/02/2016 93   Final  . BUN 04/02/2016 17  4 - 21 mg/dL Final  . Creatinine 04/02/2016 1.0  .5 - 1.1 mg/dL Final  . Potassium 04/02/2016 4.3  3.4 - 5.3 mmol/L Final  . Sodium 04/02/2016 138  137 - 147 mmol/L Final  . Triglycerides 04/02/2016 92  40 - 160 mg/dL Final  . Cholesterol 04/02/2016 216* 0 - 200 mg/dL Final  . HDL 04/02/2016 48  35 - 70 mg/dL Final  . LDL Cholesterol 04/02/2016 150   Final  . Alkaline Phosphatase 04/02/2016 71  25 - 125 U/L Final  . ALT 04/02/2016 29  7 - 35 U/L Final  . AST 04/02/2016 25  13 - 35 U/L Final  . Bilirubin, Total 04/02/2016 0.8   Final  . TSH 04/02/2016 2.22  .41 - 5.90 uIU/mL Final    No results found.   Assessment/Plan   ICD-9-CM ICD-10-CM   1. Heart murmur 785.2 R01.1 ECHOCARDIOGRAM COMPLETE  2.  Hyperlipidemia 272.4 E78.5   3. Hypothyroidism due to acquired atrophy of thyroid 244.8 E03.8    246.8 E03.4   4. Osteopenia 733.90 M85.80   5. OSA treated with BiPAP 327.23 G47.33   6. Psoriatic arthritis, 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HCC) 696.0 L40.52   7. Dizziness and giddiness 780.4 R42   8. High risk medication use V58.69 Z79.899    Recommend positional vertigo exercises (vestibular rehab) at least 2 times daily  Old records/labs reviewed  Continue current medications as ordered  Follow up with specialists as scheduled  Will call with heart [place] appt  Follow up in 5 mos for CPE/ECG/MMSE   Paris Hohn S. Perlie Gold  [facility] and Adult Medicine [address] [place], [place] [address] [phone] Cell (Monday-Friday 8 AM - 5 PM) [phone] After 5 PM and follow prompts

## 2016-05-02 NOTE — Patient Instructions (Addendum)
Recommend positional vertigo exercises (vestibular rehab) at least 2 times daily  Continue current medications as ordered  Follow up with specialists as scheduled  Will call with heart [place] appt  Follow up in 5 mos for CPE/ECG/MMSE

## 2016-05-15 DIAGNOSIS — M160 Bilateral primary osteoarthritis of hip: Secondary | ICD-10-CM | POA: Diagnosis not present

## 2016-05-15 DIAGNOSIS — M47816 Spondylosis without myelopathy or radiculopathy, lumbar region: Secondary | ICD-10-CM | POA: Diagnosis not present

## 2016-05-15 DIAGNOSIS — M19072 Primary osteoarthritis, left ankle and foot: Secondary | ICD-10-CM | POA: Diagnosis not present

## 2016-05-15 DIAGNOSIS — M2550 Pain in unspecified joint: Secondary | ICD-10-CM | POA: Diagnosis not present

## 2016-05-15 DIAGNOSIS — M19041 Primary osteoarthritis, right hand: Secondary | ICD-10-CM | POA: Diagnosis not present

## 2016-05-15 DIAGNOSIS — M19071 Primary osteoarthritis, right ankle and foot: Secondary | ICD-10-CM | POA: Diagnosis not present

## 2016-05-15 DIAGNOSIS — L4050 Arthropathic psoriasis, unspecified: Secondary | ICD-10-CM | POA: Diagnosis not present

## 2016-05-15 DIAGNOSIS — L409 Psoriasis, unspecified: Secondary | ICD-10-CM | POA: Diagnosis not present

## 2016-05-15 DIAGNOSIS — M47812 Spondylosis without myelopathy or radiculopathy, cervical region: Secondary | ICD-10-CM | POA: Diagnosis not present

## 2016-05-15 DIAGNOSIS — M19042 Primary osteoarthritis, left hand: Secondary | ICD-10-CM | POA: Diagnosis not present

## 2016-05-20 ENCOUNTER — Ambulatory Visit (HOSPITAL_COMMUNITY): Payer: Medicare Other | Attending: Cardiology

## 2016-05-20 ENCOUNTER — Other Ambulatory Visit (HOSPITAL_COMMUNITY): Payer: Self-pay

## 2016-05-20 DIAGNOSIS — R011 Cardiac murmur, unspecified: Secondary | ICD-10-CM | POA: Diagnosis not present

## 2016-05-20 DIAGNOSIS — G4730 Sleep apnea, unspecified: Secondary | ICD-10-CM | POA: Insufficient documentation

## 2016-05-20 DIAGNOSIS — E785 Hyperlipidemia, unspecified: Secondary | ICD-10-CM | POA: Diagnosis not present

## 2016-05-20 DIAGNOSIS — I351 Nonrheumatic aortic (valve) insufficiency: Secondary | ICD-10-CM | POA: Insufficient documentation

## 2016-05-20 DIAGNOSIS — E079 Disorder of thyroid, unspecified: Secondary | ICD-10-CM | POA: Insufficient documentation

## 2016-05-22 ENCOUNTER — Other Ambulatory Visit: Payer: Self-pay

## 2016-05-22 DIAGNOSIS — I358 Other nonrheumatic aortic valve disorders: Secondary | ICD-10-CM

## 2016-06-02 ENCOUNTER — Encounter: Payer: Self-pay | Admitting: Nurse Practitioner

## 2016-06-02 ENCOUNTER — Ambulatory Visit (INDEPENDENT_AMBULATORY_CARE_PROVIDER_SITE_OTHER): Payer: Medicare Other | Admitting: Nurse Practitioner

## 2016-06-02 VITALS — BP 112/72 | HR 87 | Temp 98.5°F | Resp 18 | Ht 65.0 in | Wt 155.8 lb

## 2016-06-02 DIAGNOSIS — J329 Chronic sinusitis, unspecified: Secondary | ICD-10-CM | POA: Diagnosis not present

## 2016-06-02 MED ORDER — AMOXICILLIN-POT CLAVULANATE 875-125 MG PO TABS: 1.0000 | ORAL_TABLET | Freq: Two times a day (BID) | ORAL | 0 refills | Status: DC

## 2016-06-02 NOTE — Patient Instructions (Addendum)
To hold next enbrel injection due to being sick Start Augmentin 1 tablet twice daily  May use mucinex DM twice daily with full glass of water for cough and congestion florastor twice daily for 2 weeks   Sinusitis, Adult Sinusitis is redness, soreness, and inflammation of the paranasal sinuses. Paranasal sinuses are air pockets within the bones of your face. They are located beneath your eyes, in the middle of your forehead, and above your eyes. In healthy paranasal sinuses, mucus is able to drain out, and air is able to circulate through them by way of your nose. However, when your paranasal sinuses are inflamed, mucus and air can become trapped. This can allow bacteria and other germs to grow and cause infection. Sinusitis can develop quickly and last only a short time (acute) or continue over a long period (chronic). Sinusitis that lasts for more than 12 weeks is considered chronic. CAUSES Causes of sinusitis include:  Allergies.  Structural abnormalities, such as displacement of the cartilage that separates your nostrils (deviated septum), which can decrease the air flow through your nose and sinuses and affect sinus drainage.  Functional abnormalities, such as when the small hairs (cilia) that line your sinuses and help remove mucus do not work properly or are not present. SIGNS AND SYMPTOMS Symptoms of acute and chronic sinusitis are the same. The primary symptoms are pain and pressure around the affected sinuses. Other symptoms include:  Upper toothache.  Earache.  Headache.  Bad breath.  Decreased sense of smell and taste.  A cough, which worsens when you are lying flat.  Fatigue.  Fever.  Thick drainage from your nose, which often is green and may contain pus (purulent).  Swelling and warmth over the affected sinuses. DIAGNOSIS Your health care provider will perform a physical exam. During your exam, your health care provider may perform any of the following to help  determine if you have acute sinusitis or chronic sinusitis:  Look in your nose for signs of abnormal growths in your nostrils (nasal polyps).  Tap over the affected sinus to check for signs of infection.  View the inside of your sinuses using an imaging device that has a light attached (endoscope). If your health care provider suspects that you have chronic sinusitis, one or more of the following tests may be recommended:  Allergy tests.  Nasal culture. A sample of mucus is taken from your nose, sent to a lab, and screened for bacteria.  Nasal cytology. A sample of mucus is taken from your nose and examined by your health care provider to determine if your sinusitis is related to an allergy. TREATMENT Most cases of acute sinusitis are related to a viral infection and will resolve on their own within 10 days. Sometimes, medicines are prescribed to help relieve symptoms of both acute and chronic sinusitis. These may include pain medicines, decongestants, nasal steroid sprays, or saline sprays. However, for sinusitis related to a bacterial infection, your health care provider will prescribe antibiotic medicines. These are medicines that will help kill the bacteria causing the infection. Rarely, sinusitis is caused by a fungal infection. In these cases, your health care provider will prescribe antifungal medicine. For some cases of chronic sinusitis, surgery is needed. Generally, these are cases in which sinusitis recurs more than 3 times per year, despite other treatments. HOME CARE INSTRUCTIONS  Drink plenty of water. Water helps thin the mucus so your sinuses can drain more easily.  Use a humidifier.  Inhale steam 3-4 times a  day (for example, sit in the bathroom with the shower running).  Apply a warm, moist washcloth to your face 3-4 times a day, or as directed by your health care provider.  Use saline nasal sprays to help moisten and clean your sinuses.  Take medicines only as  directed by your health care provider.  If you were prescribed either an antibiotic or antifungal medicine, finish it all even if you start to feel better. SEEK IMMEDIATE MEDICAL CARE IF:  You have increasing pain or severe headaches.  You have nausea, vomiting, or drowsiness.  You have swelling around your face.  You have vision problems.  You have a stiff neck.  You have difficulty breathing.   This information is not intended to replace advice given to you by your health care provider. Make sure you discuss any questions you have with your health care provider.   Document Released: 10/13/2005 Document Revised: 11/03/2014 Document Reviewed: 10/28/2011 Elsevier Interactive Patient Education [employer].

## 2016-06-02 NOTE — Progress Notes (Signed)
PCP: Sydney Cranker, DO  Advanced Directive information Does patient have an advance directive?: Yes, Type of Advance Directive: Living will, Does patient want to make changes to advanced directive?: No - Patient declined  Allergies  Allergen Reactions  . Codeine Nausea And Vomiting  . Statins Hives    Myalgias     Chief Complaint  Patient presents with  . Acute Visit    Possible sinus infection; began Thurs with swollen throat, followed by thick drainage down throat, fever, loose stools, and some nausea.      HPI: Patient is a 75 y.o. female seen in the office today due to not feeling well. Hx of psoriasis on enbrel  Husband had a small stroke last week and was in the hospital. She was extremely cold while she was there which is not normal. 5 days ago she felt her throat was swelling- had a hard time swallowing. 3 days ago she slept all day. 2 days ago have a fever of 101.6 and had sinus drainage and felt like stuff is settling in her chest.  Has no energy and wants to sleep.  Had diarrhea and some nausea yesterday, none today.  Increase pressure in her head No tylenol today.  No shortness of breath but nose is stuffy making it harder to breath No known sick contacts No appetite   Review of Systems:  Review of Systems  Constitutional: Positive for activity change, appetite change, chills, fatigue and fever.  HENT: Positive for congestion, postnasal drip, rhinorrhea and sinus pressure.   Respiratory: Positive for cough. Negative for shortness of breath.   Cardiovascular: Negative for chest pain and palpitations.  Gastrointestinal: Positive for diarrhea and nausea. Negative for vomiting.  Neurological: Negative for headaches.    Past Medical History:  Diagnosis Date  . H/O total knee replacement 2001   Right  . High cholesterol   . History of tonsillectomy and adenoidectomy 1946  . Psoriatic arthritis ([place]) 1990  . Sleep apnea with use of continuous positive airway  pressure (CPAP) 2004  . Thyroid disease    Past Surgical History:  Procedure Laterality Date  . FOOT SURGERY     fused arch in left foot  . SPINE SURGERY    . TOTAL KNEE ARTHROPLASTY Left    Social History:   reports that she has quit smoking. Her smoking use included Cigarettes. She has a 34.00 pack-year smoking history. She has never used smokeless tobacco. She reports that she drinks about 1.2 - 4.2 oz of alcohol per week . She reports that she does not use drugs.  Family History  Problem Relation Age of Onset  . Heart disease Sister     Medications: Patient's Medications  New Prescriptions   No medications on file  Previous Medications   ASPIRIN 81 MG TABLET    Take 81 mg by mouth daily.   ETANERCEPT (ENBREL) 50 MG/ML INJECTION    Inject 50 mg into the skin once a week.   HYDROCODONE-ACETAMINOPHEN (NORCO/VICODIN) 5-325 MG TABLET    Take 1 tablet by mouth every 6 (six) hours as needed for moderate pain.   LEVOTHYROXINE (SYNTHROID, LEVOTHROID) 88 MCG TABLET    Take 1 tablet (88 mcg total) by mouth daily before breakfast.   MELOXICAM (MOBIC) 15 MG TABLET    Take 1 tablet (15 mg total) by mouth daily.   VITAMIN D, ERGOCALCIFEROL, (DRISDOL) 50000 UNITS CAPS CAPSULE    Take 50,000 Units by mouth every 7 (seven) days.  Modified  Medications   No medications on file  Discontinued Medications   No medications on file     Physical Exam:  Vitals:   06/02/16 1142  BP: 112/72  Pulse: 87  Resp: 18  Temp: 98.5 F (36.9 C)  TempSrc: Oral  SpO2: 97%  Weight: 155 lb 12.8 oz (70.7 kg)  Height: 5\' 5"  (1.651 m)   Body mass index is 25.93 kg/m.  Physical Exam  Constitutional: She is oriented to person, place, and time. She appears well-developed and well-nourished.  HENT:  Head: Normocephalic and atraumatic.  Right Ear: External ear normal.  Left Ear: External ear normal.  Nose: Nose normal.  Mouth/Throat: Oropharynx is clear and moist. No oropharyngeal exudate.  Eyes:  Pupils are equal, round, and reactive to light. No scleral icterus.  Neck: Neck supple. Carotid bruit is not present.  Cardiovascular: Normal rate, regular rhythm and normal heart sounds.  Exam reveals no gallop and no friction rub.   Pulmonary/Chest: Effort normal and breath sounds normal. No respiratory distress.  Abdominal: Soft. Bowel sounds are normal. There is no hepatomegaly.  Musculoskeletal:  Joint deformities in hand/fingers  Neurological: She is alert and oriented to person, place, and time. She has normal reflexes.  Skin: Skin is warm and dry.  Nursing note reviewed.   Labs reviewed: Basic Metabolic Panel:  Recent Labs  04/02/16  NA 138  K 4.3  BUN 17  CREATININE 1.0  TSH 2.22   Liver Function Tests:  Recent Labs  04/02/16  AST 25  ALT 29  ALKPHOS 71   No results for input(s): LIPASE, AMYLASE in the last 8760 hours. No results for input(s): AMMONIA in the last 8760 hours. CBC:  Recent Labs  04/02/16  WBC 6.0  HGB 15.7  HCT 49*  PLT 223   Lipid Panel:  Recent Labs  04/02/16  CHOL 216*  HDL 48  LDLCALC 150  TRIG 92   TSH:  Recent Labs  04/02/16  TSH 2.22   A1C: No results found for: HGBA1C   Assessment/Plan 1. Sinusitis, unspecified chronicity, unspecified location -to hold embrel due to being sick -encouraged good nutrition and hydration -florastor twice daily for GI health -mucinex DM twice daily with full glass of water for cough and congestion  - amoxicillin-clavulanate (AUGMENTIN) 875-125 MG tablet; Take 1 tablet by mouth 2 (two) times daily.  Dispense: 20 tablet; Refill: 0 -follow up precautions discussed with pt, states understanding   Sydney Reid  [facility] [phone] 8 am - 5 pm) [phone] (after hours)

## 2016-06-26 ENCOUNTER — Encounter: Payer: Self-pay | Admitting: *Deleted

## 2016-06-26 DIAGNOSIS — M79671 Pain in right foot: Secondary | ICD-10-CM | POA: Diagnosis not present

## 2016-06-26 DIAGNOSIS — L84 Corns and callosities: Secondary | ICD-10-CM | POA: Diagnosis not present

## 2016-06-26 DIAGNOSIS — M79672 Pain in left foot: Secondary | ICD-10-CM | POA: Diagnosis not present

## 2016-06-26 DIAGNOSIS — I739 Peripheral vascular disease, unspecified: Secondary | ICD-10-CM | POA: Diagnosis not present

## 2016-06-26 DIAGNOSIS — L603 Nail dystrophy: Secondary | ICD-10-CM | POA: Diagnosis not present

## 2016-07-13 NOTE — Progress Notes (Signed)
Cardiology Office Note   Date:  07/14/2016   ID:  Sydney Reid, DOB June 01, 1941, MRN [id number]  PCP:  Sydney Cranker, DO    No chief complaint on file. murmur   Wt Readings from Last 3 Encounters:  07/14/16 153 lb (69.4 kg)  06/02/16 155 lb 12.8 oz (70.7 kg)  05/02/16 158 lb 12.8 oz (72 kg)       History of Present Illness: Sydney Reid is a 75 y.o. female  Who had an echocardiogram for murmur, with normal LVEF 60-65%, moderately thickened aortic valve.  Mild AI noted but normal velocities and no stenosis.   SHe was not having any SHOB or chest pain at the time.    She has had palpitations.  She has worn a heart monitor when in [place], [place].  She was told that she had skipped beats.    She does not exercise regularly.  She quit exercising 6 years ago- she was tired of her many years of regular exercise.  She has psoriatic arthritis.    She has had high cholesterol.  She did not tolerate statins in the past. She does not like to take medicine.        Past Medical History:  Diagnosis Date  . H/O total knee replacement 2001   Right  . High cholesterol   . History of tonsillectomy and adenoidectomy 1946  . Psoriatic arthritis ([place]) 1990  . Sleep apnea with use of continuous positive airway pressure (CPAP) 2004  . Thyroid disease     Past Surgical History:  Procedure Laterality Date  . FOOT SURGERY     fused arch in left foot  . SPINE SURGERY    . TOTAL KNEE ARTHROPLASTY Left      Current Outpatient Prescriptions  Medication Sig Dispense Refill  . amoxicillin-clavulanate (AUGMENTIN) 875-125 MG tablet Take 1 tablet by mouth 2 (two) times daily. FOR DENTAL APPTS.    Marland Kitchen aspirin 81 MG tablet Take 81 mg by mouth daily.    Marland Kitchen etanercept (ENBREL) 50 MG/ML injection Inject 50 mg into the skin once a week.    Marland Kitchen HYDROcodone-acetaminophen (NORCO/VICODIN) 5-325 MG tablet Take 1 tablet by mouth at bedtime.     Marland Kitchen levothyroxine (SYNTHROID, LEVOTHROID) 88 MCG  tablet Take 1 tablet (88 mcg total) by mouth daily before breakfast. 90 tablet 2  . meloxicam (MOBIC) 15 MG tablet Take 1 tablet (15 mg total) by mouth daily. 90 tablet 2  . Vitamin D, Ergocalciferol, (DRISDOL) 50000 units CAPS capsule Take 50,000 Units by mouth every 7 (seven) days.     No current facility-administered medications for this visit.     Allergies:   Codeine and Statins    Social History:  The patient  reports that she has quit smoking. Her smoking use included Cigarettes. She has a 34.00 pack-year smoking history. She has never used smokeless tobacco. She reports that she drinks about 1.2 - 4.2 oz of alcohol per week . She reports that she does not use drugs.   Family History:  The patient's family history includes Heart attack in her father; Heart disease in her sister.    ROS:  Please see the history of present illness.   Otherwise, review of systems are positive for rare palpitations-stable.   All other systems are reviewed and negative.    PHYSICAL EXAM: VS:  BP 110/68   Pulse 91   Ht 5\' 5"  (1.651 m)   Wt 153 lb (69.4 kg)  BMI 25.46 kg/m  , BMI Body mass index is 25.46 kg/m. GEN: Well nourished, well developed, in no acute distress  HEENT: normal  Neck: no JVD, carotid bruits, or masses Cardiac: RRR; soft systolic murmur, no rubs, or gallops,no edema  Respiratory:  clear to auscultation bilaterally, normal work of breathing GI: soft, nontender, nondistended, + BS MS:  deformity of fingers on both hands Skin: warm and dry, no rash Neuro:  Strength and sensation are intact Psych: euthymic mood, full affect   EKG:   The ekg ordered today demonstrates NSR, PRWP   Recent Labs: 04/02/2016: ALT 29; BUN 17; Creatinine 1.0; Hemoglobin 15.7; Platelets 223; Potassium 4.3; Sodium 138; TSH 2.22   Lipid Panel    Component Value Date/Time   CHOL 216 (A) 04/02/2016   TRIG 92 04/02/2016   HDL 48 04/02/2016   LDLCALC 150 04/02/2016     Other studies  Reviewed: Additional studies/ records that were reviewed today with results demonstrating: Echo results as noted above..   ASSESSMENT AND PLAN:  1. Aortic sclerosis:  No further management needed. No need for SBE prophylaxis. She does take antibiotics before the dentist for her joint replacements. No significant aortic stenosis on exam. 2. Hyperlipidemia:  She has been hesitant to take any statin. Could consider Zetia to help lower her cholesterol. Exercise would also help. I recommended that she try to get 30 minutes a day 5 days a week of aerobic exercise. 3. Palpitations: Stable. She was found to have premature beats in the past. No further workup at this time.   Current medicines are reviewed at length with the patient today.  The patient concerns regarding her medicines were addressed.  The following changes have been made:  No change  Labs/ tests ordered today include:  No orders of the defined types were placed in this encounter.   Recommend 150 minutes/week of aerobic exercise Low fat, low carb, high fiber diet recommended  Disposition:   FU in 1 year   Signed, Larae Grooms, MD  07/14/2016 10:44 AM    [facility] [address], [place], [place]  [address] Phone: [phone]; Fax: [phone]

## 2016-07-14 ENCOUNTER — Encounter: Payer: Self-pay | Admitting: Interventional Cardiology

## 2016-07-14 ENCOUNTER — Ambulatory Visit (INDEPENDENT_AMBULATORY_CARE_PROVIDER_SITE_OTHER): Payer: Medicare Other | Admitting: Interventional Cardiology

## 2016-07-14 VITALS — BP 110/68 | HR 91 | Ht 65.0 in | Wt 153.0 lb

## 2016-07-14 DIAGNOSIS — R002 Palpitations: Secondary | ICD-10-CM

## 2016-07-14 DIAGNOSIS — I358 Other nonrheumatic aortic valve disorders: Secondary | ICD-10-CM | POA: Diagnosis not present

## 2016-07-14 DIAGNOSIS — E785 Hyperlipidemia, unspecified: Secondary | ICD-10-CM | POA: Insufficient documentation

## 2016-07-14 DIAGNOSIS — IMO0001 Reserved for inherently not codable concepts without codable children: Secondary | ICD-10-CM | POA: Insufficient documentation

## 2016-07-14 NOTE — Patient Instructions (Signed)
**Note De-identified Tashawn Laswell Obfuscation** Medication Instructions:  Same-no changes  Labwork: None  Testing/Procedures: None  Follow-Up: As needed     If you need a refill on your cardiac medications before your next appointment, please call your pharmacy.   

## 2016-09-16 DIAGNOSIS — Z23 Encounter for immunization: Secondary | ICD-10-CM | POA: Diagnosis not present

## 2016-09-22 DIAGNOSIS — M545 Low back pain: Secondary | ICD-10-CM | POA: Diagnosis not present

## 2016-09-22 DIAGNOSIS — M2550 Pain in unspecified joint: Secondary | ICD-10-CM | POA: Diagnosis not present

## 2016-09-22 DIAGNOSIS — L409 Psoriasis, unspecified: Secondary | ICD-10-CM | POA: Diagnosis not present

## 2016-09-22 DIAGNOSIS — L4050 Arthropathic psoriasis, unspecified: Secondary | ICD-10-CM | POA: Diagnosis not present

## 2016-11-03 DIAGNOSIS — G4733 Obstructive sleep apnea (adult) (pediatric): Secondary | ICD-10-CM | POA: Diagnosis not present

## 2016-11-03 DIAGNOSIS — Z9989 Dependence on other enabling machines and devices: Secondary | ICD-10-CM | POA: Diagnosis not present

## 2016-11-03 DIAGNOSIS — Z7982 Long term (current) use of aspirin: Secondary | ICD-10-CM | POA: Diagnosis not present

## 2016-11-20 ENCOUNTER — Other Ambulatory Visit: Payer: Self-pay

## 2016-11-20 ENCOUNTER — Other Ambulatory Visit: Payer: Self-pay | Admitting: Internal Medicine

## 2016-11-20 MED ORDER — LEVOTHYROXINE SODIUM 88 MCG PO TABS: ORAL_TABLET | ORAL | 0 refills | Status: DC

## 2016-11-20 NOTE — Telephone Encounter (Signed)
Per Dr. Eulas Post patient is not to receive anymore refills until she comes in for an appointment.

## 2016-11-20 NOTE — Telephone Encounter (Signed)
Ok to RF x 3. She needs f/u appt in office for future RF. She was overdue for CPE in 12/17

## 2016-12-03 DIAGNOSIS — I70203 Unspecified atherosclerosis of native arteries of extremities, bilateral legs: Secondary | ICD-10-CM | POA: Diagnosis not present

## 2016-12-03 DIAGNOSIS — M79674 Pain in right toe(s): Secondary | ICD-10-CM | POA: Diagnosis not present

## 2016-12-03 DIAGNOSIS — M2042 Other hammer toe(s) (acquired), left foot: Secondary | ICD-10-CM | POA: Diagnosis not present

## 2016-12-03 DIAGNOSIS — B351 Tinea unguium: Secondary | ICD-10-CM | POA: Diagnosis not present

## 2016-12-11 DIAGNOSIS — Z803 Family history of malignant neoplasm of breast: Secondary | ICD-10-CM | POA: Diagnosis not present

## 2016-12-11 DIAGNOSIS — Z1231 Encounter for screening mammogram for malignant neoplasm of breast: Secondary | ICD-10-CM | POA: Diagnosis not present

## 2017-01-15 ENCOUNTER — Telehealth: Payer: Self-pay | Admitting: Internal Medicine

## 2017-01-15 NOTE — Telephone Encounter (Signed)
left msg asking pt to call and schedule AWV (initial) that's due now. VDM (DD)

## 2017-01-19 ENCOUNTER — Telehealth: Payer: Self-pay | Admitting: *Deleted

## 2017-01-19 DIAGNOSIS — R1013 Epigastric pain: Secondary | ICD-10-CM | POA: Diagnosis not present

## 2017-01-19 DIAGNOSIS — R109 Unspecified abdominal pain: Secondary | ICD-10-CM | POA: Diagnosis not present

## 2017-01-19 NOTE — Telephone Encounter (Signed)
Patient called and stated that she has severe indigestion and BP running high. Could not sleep last night due to chest discomfort. Stated that it hurt so bad she cant even touch it. Stated that before her dad died of heart attack he also stated that he was having severe indigestion. Wants to come in for an appointment to be evaluated. No available appointments for today. Advised patient to go to Urgent Care or call her Cardiologist to be seen today. Gave her the number to [facility]. She agreed.

## 2017-01-20 DIAGNOSIS — L4050 Arthropathic psoriasis, unspecified: Secondary | ICD-10-CM | POA: Diagnosis not present

## 2017-01-20 DIAGNOSIS — L409 Psoriasis, unspecified: Secondary | ICD-10-CM | POA: Diagnosis not present

## 2017-01-20 DIAGNOSIS — M545 Low back pain: Secondary | ICD-10-CM | POA: Diagnosis not present

## 2017-01-20 DIAGNOSIS — M2550 Pain in unspecified joint: Secondary | ICD-10-CM | POA: Diagnosis not present

## 2017-02-02 DIAGNOSIS — R109 Unspecified abdominal pain: Secondary | ICD-10-CM | POA: Diagnosis not present

## 2017-02-02 DIAGNOSIS — K2960 Other gastritis without bleeding: Secondary | ICD-10-CM | POA: Diagnosis not present

## 2017-02-20 ENCOUNTER — Telehealth: Payer: Self-pay | Admitting: Internal Medicine

## 2017-02-20 NOTE — Telephone Encounter (Signed)
left msg asking pt to confirm this AWV w/ nurse and EV w/ Dr. Eulas Post. VDM (DD)

## 2017-02-23 ENCOUNTER — Other Ambulatory Visit: Payer: Self-pay | Admitting: *Deleted

## 2017-02-23 MED ORDER — LEVOTHYROXINE SODIUM 88 MCG PO TABS: ORAL_TABLET | ORAL | 0 refills | Status: DC

## 2017-02-23 NOTE — Telephone Encounter (Signed)
Patient requested Rx to be faxed to [facility]. Patient has upcoming appointment.

## 2017-02-26 ENCOUNTER — Other Ambulatory Visit: Payer: Self-pay

## 2017-02-26 MED ORDER — LEVOTHYROXINE SODIUM 88 MCG PO TABS: ORAL_TABLET | ORAL | 0 refills | Status: DC

## 2017-03-30 DIAGNOSIS — Z79899 Other long term (current) drug therapy: Secondary | ICD-10-CM | POA: Diagnosis not present

## 2017-04-03 ENCOUNTER — Other Ambulatory Visit: Payer: Self-pay | Admitting: Internal Medicine

## 2017-04-03 DIAGNOSIS — E782 Mixed hyperlipidemia: Secondary | ICD-10-CM

## 2017-04-03 DIAGNOSIS — Z79899 Other long term (current) drug therapy: Secondary | ICD-10-CM

## 2017-04-03 DIAGNOSIS — E034 Atrophy of thyroid (acquired): Secondary | ICD-10-CM

## 2017-04-09 ENCOUNTER — Other Ambulatory Visit: Payer: Medicare Other

## 2017-04-09 ENCOUNTER — Ambulatory Visit (INDEPENDENT_AMBULATORY_CARE_PROVIDER_SITE_OTHER): Payer: Medicare Other

## 2017-04-09 VITALS — BP 118/86 | HR 82 | Temp 98.3°F | Ht 65.0 in | Wt 152.0 lb

## 2017-04-09 DIAGNOSIS — E782 Mixed hyperlipidemia: Secondary | ICD-10-CM | POA: Diagnosis not present

## 2017-04-09 DIAGNOSIS — Z79899 Other long term (current) drug therapy: Secondary | ICD-10-CM | POA: Diagnosis not present

## 2017-04-09 DIAGNOSIS — Z0000 Encounter for general adult medical examination without abnormal findings: Secondary | ICD-10-CM | POA: Diagnosis not present

## 2017-04-09 DIAGNOSIS — E034 Atrophy of thyroid (acquired): Secondary | ICD-10-CM | POA: Diagnosis not present

## 2017-04-09 LAB — CBC WITH DIFFERENTIAL/PLATELET
Basophils Absolute: 0 cells/uL (ref 0–200)
Basophils Relative: 0 %
Eosinophils Absolute: 0 cells/uL — ABNORMAL LOW (ref 15–500)
Eosinophils Relative: 0 %
HCT: 47.4 % — ABNORMAL HIGH (ref 35.0–45.0)
Hemoglobin: 15.3 g/dL (ref 11.7–15.5)
Lymphocytes Relative: 34 %
Lymphs Abs: 1904 cells/uL (ref 850–3900)
MCH: 26.6 pg — ABNORMAL LOW (ref 27.0–33.0)
MCHC: 32.3 g/dL (ref 32.0–36.0)
MCV: 82.3 fL (ref 80.0–100.0)
MPV: 10.4 fL (ref 7.5–12.5)
Monocytes Absolute: 616 cells/uL (ref 200–950)
Monocytes Relative: 11 %
Neutro Abs: 3080 cells/uL (ref 1500–7800)
Neutrophils Relative %: 55 %
Platelets: 230 10*3/uL (ref 140–400)
RBC: 5.76 MIL/uL — ABNORMAL HIGH (ref 3.80–5.10)
RDW: 15.1 % — ABNORMAL HIGH (ref 11.0–15.0)
WBC: 5.6 10*3/uL (ref 3.8–10.8)

## 2017-04-09 LAB — COMPLETE METABOLIC PANEL WITH GFR
ALT: 14 U/L (ref 6–29)
AST: 15 U/L (ref 10–35)
Albumin: 4.3 g/dL (ref 3.6–5.1)
Alkaline Phosphatase: 74 U/L (ref 33–130)
BUN: 18 mg/dL (ref 7–25)
CO2: 22 mmol/L (ref 20–31)
Calcium: 9.5 mg/dL (ref 8.6–10.4)
Chloride: 107 mmol/L (ref 98–110)
Creat: 1.04 mg/dL — ABNORMAL HIGH (ref 0.60–0.93)
GFR, Est African American: 60 mL/min (ref >=60)
GFR, Est Non African American: 52 mL/min — ABNORMAL LOW (ref >=60)
Glucose, Bld: 105 mg/dL — ABNORMAL HIGH (ref 65–99)
Potassium: 4.3 mmol/L (ref 3.5–5.3)
Sodium: 140 mmol/L (ref 135–146)
Total Bilirubin: 0.6 mg/dL (ref 0.2–1.2)
Total Protein: 7.6 g/dL (ref 6.1–8.1)

## 2017-04-09 LAB — LIPID PANEL
Cholesterol: 213 mg/dL — ABNORMAL HIGH (ref <200)
HDL: 49 mg/dL — ABNORMAL LOW (ref >50)
LDL Cholesterol: 141 mg/dL — ABNORMAL HIGH (ref <100)
Total CHOL/HDL Ratio: 4.3 Ratio (ref <5.0)
Triglycerides: 113 mg/dL (ref <150)
VLDL: 23 mg/dL (ref <30)

## 2017-04-09 LAB — TSH: TSH: 1.77 mIU/L

## 2017-04-09 LAB — T4, FREE: Free T4: 1.5 ng/dL (ref 0.8–1.8)

## 2017-04-09 NOTE — Progress Notes (Signed)
Subjective:   Sydney Reid is a 76 y.o. female who presents for an Initial Medicare Annual Wellness Visit.      Objective:    Today's Vitals   04/09/17 0909  BP: 118/86  Pulse: 82  Temp: 98.3 F (36.8 C)  TempSrc: Oral  SpO2: 97%  Weight: 152 lb (68.9 kg)  Height: '5\' 5"'$  (1.651 m)  PainSc: 1    Body mass index is 25.29 kg/m.   Current Medications (verified) Outpatient Encounter Prescriptions as of 04/09/2017  Medication Sig  . amoxicillin-clavulanate (AUGMENTIN) 875-125 MG tablet Take 1 tablet by mouth 2 (two) times daily. FOR DENTAL APPTS.  Marland Kitchen aspirin 81 MG tablet Take 81 mg by mouth daily.  Marland Kitchen etanercept (ENBREL) 50 MG/ML injection Inject 50 mg into the skin once a week.  Marland Kitchen HYDROcodone-acetaminophen (NORCO/VICODIN) 5-325 MG tablet Take 1 tablet by mouth at bedtime.   Marland Kitchen levothyroxine (SYNTHROID, LEVOTHROID) 88 MCG tablet Take one tablet by mouth once daily 30 minutes before breakfast for thyroid.  . meloxicam (MOBIC) 15 MG tablet Take 1 tablet (15 mg total) by mouth daily.  . [DISCONTINUED] Vitamin D, Ergocalciferol, (DRISDOL) 50000 units CAPS capsule Take 50,000 Units by mouth every 7 (seven) days.   No facility-administered encounter medications on file as of 04/09/2017.     Allergies (verified) Codeine and Statins   History: Past Medical History:  Diagnosis Date  . H/O total knee replacement 2001   Right  . High cholesterol   . History of tonsillectomy and adenoidectomy 1946  . Psoriatic arthritis ([place]) 1990  . Sleep apnea with use of continuous positive airway pressure (CPAP) 2004  . Thyroid disease    Past Surgical History:  Procedure Laterality Date  . FOOT SURGERY     fused arch in left foot  . SPINE SURGERY    . TOTAL KNEE ARTHROPLASTY Left    Family History  Problem Relation Age of Onset  . Heart attack Father   . Heart disease Sister    Social History   Occupational History  . Not on file.   Social History Main Topics  . Smoking  status: Former Smoker    Packs/day: 2.00    Years: 18.00    Types: Cigarettes  . Smokeless tobacco: Never Used  . Alcohol use 1.2 - 4.2 oz/week    2 - 7 Standard drinks or equivalent per week     Comment: Social drinker, couple times a month  . Drug use: No  . Sexual activity: Not on file    Tobacco Counseling Counseling given: Not Answered   Activities of Daily Living In your present state of health, do you have any difficulty performing the following activities: 04/09/2017  Hearing? N  Vision? N  Difficulty concentrating or making decisions? N  Walking or climbing stairs? N  Dressing or bathing? N  Doing errands, shopping? N  Preparing Food and eating ? N  Using the Toilet? N  In the past six months, have you accidently leaked urine? N  Do you have problems with loss of bowel control? N  Managing your Medications? N  Managing your Finances? N  Housekeeping or managing your Housekeeping? N  Some recent data might be hidden    Immunizations and Health Maintenance  There is no immunization history on file for this patient. Health Maintenance Due  Topic Date Due  . TETANUS/TDAP  02/25/1959  . DEXA SCAN  02/24/2005  . PNA vac Low Risk Adult (1 of 2 -  PCV13) 02/24/2005    Patient Care Team: Gildardo Cranker, DO as PCP - General (Internal Medicine) Tomma Rakers, MD as Referring Physician (Ophthalmology)  Indicate any recent Medical Services you may have received from other than [facility] providers in the past year (date may be approximate).     Assessment:   This is a routine wellness examination for [place].   Hearing/Vision screen No exam data present  Dietary issues and exercise activities discussed: Current Exercise Habits: The patient does not participate in regular exercise at present, Exercise limited by: orthopedic condition(s)  Goals    . Pilaties and tai chi          Patient will start doing tai chi and pilates.      Depression Screen PHQ 2/9  Scores 04/09/2017 05/02/2016  PHQ - 2 Score 0 0    Fall Risk Fall Risk  04/09/2017 06/02/2016 05/02/2016  Falls in the past year? No No No    Cognitive Function: MMSE - Mini Mental State Exam 04/09/2017  Orientation to time 5  Orientation to Place 5  Registration 3  Attention/ Calculation 5  Recall 3  Language- name 2 objects 2  Language- repeat 1  Language- follow 3 step command 3  Language- read & follow direction 1  Write a sentence 1  Copy design 1  Total score 30        Screening Tests Health Maintenance  Topic Date Due  . TETANUS/TDAP  02/25/1959  . DEXA SCAN  02/24/2005  . PNA vac Low Risk Adult (1 of 2 - PCV13) 02/24/2005  . INFLUENZA VACCINE  05/27/2017      Plan:    I have personally reviewed and addressed the Medicare Annual Wellness questionnaire and have noted the following in the patient's chart:  A. Medical and social history B. Use of alcohol, tobacco or illicit drugs  C. Current medications and supplements D. Functional ability and status E.  Nutritional status F.  Physical activity G. Advance directives H. List of other physicians I.  Hospitalizations, surgeries, and ER visits in previous 12 months J.  [place] to include hearing, vision, cognitive, depression L. Referrals and appointments - none  In addition, I have reviewed and discussed with patient certain preventive protocols, quality metrics, and best practice recommendations. A written personalized care plan for preventive services as well as general preventive health recommendations were provided to patient.  See attached scanned questionnaire for additional information.   Signed,   Rich Reining, RN Nurse Health Advisor   Quick Notes   Health Maintenance:      Abnormal Screen:     Patient Concerns:      Nurse Concerns:    I reviewed health advisor's note, was available for consultation and agree with the assessment and plan as written.    Tiffany L.  Reed, D.O. [facility] [address], [place] [address] Cell Phone (Mon-Fri 8am-5pm):  [phone] On Call:  [phone] & follow prompts after 5pm & weekends Office Phone:  [phone] Office Fax:  [phone]

## 2017-04-09 NOTE — Patient Instructions (Addendum)
Ms. Sydney Reid , Thank you for taking time to come for your Medicare Wellness Visit. I appreciate your ongoing commitment to your health goals. Please review the following plan we discussed and let me know if I can assist you in the future.   Screening recommendations/referrals: Colonoscopy up to date, pt over age 77 Mammogram up to date, pt over age 62 Bone Density up to date Recommended yearly ophthalmology/optometry visit for glaucoma screening and checkup Recommended yearly dental visit for hygiene and checkup  Vaccinations: Influenza vaccine due in 06/2017 Pneumococcal vaccine up to date  Tdap vaccine-waiting on records Shingles vaccine contraindicated  Advanced directives: Advance directive discussed with you today. I have provided a copy for you to complete at home and have notarized. Once this is complete please bring a copy in to our office so we can scan it into your chart.   Conditions/risks identified: None  Next appointment: Dr. Eulas Post 04/24/17 @ 2:45pm   Preventive Care 65 Years and Older, Female Preventive care refers to lifestyle choices and visits with your health care provider that can promote health and wellness. What does preventive care include?  A yearly physical exam. This is also called an annual well check.  Dental exams once or twice a year.  Routine eye exams. Ask your health care provider how often you should have your eyes checked.  Personal lifestyle choices, including:  Daily care of your teeth and gums.  Regular physical activity.  Eating a healthy diet.  Avoiding tobacco and drug use.  Limiting alcohol use.  Practicing safe sex.  Taking low-dose aspirin every day.  Taking vitamin and mineral supplements as recommended by your health care provider. What happens during an annual well check? The services and screenings done by your health care provider during your annual well check will depend on your age, overall health, lifestyle risk  factors, and family history of disease. Counseling  Your health care provider may ask you questions about your:  Alcohol use.  Tobacco use.  Drug use.  Emotional well-being.  Home and relationship well-being.  Sexual activity.  Eating habits.  History of falls.  Memory and ability to understand (cognition).  Work and work Statistician.  Reproductive health. Screening  You may have the following tests or measurements:  Height, weight, and BMI.  Blood pressure.  Lipid and cholesterol levels. These may be checked every 5 years, or more frequently if you are over 61 years old.  Skin check.  Lung cancer screening. You may have this screening every year starting at age 91 if you have a 30-pack-year history of smoking and currently smoke or have quit within the past 15 years.  Fecal occult blood test (FOBT) of the stool. You may have this test every year starting at age 31.  Flexible sigmoidoscopy or colonoscopy. You may have a sigmoidoscopy every 5 years or a colonoscopy every 10 years starting at age 79.  Hepatitis C blood test.  Hepatitis B blood test.  Sexually transmitted disease (STD) testing.  Diabetes screening. This is done by checking your blood sugar (glucose) after you have not eaten for a while (fasting). You may have this done every 1-3 years.  Bone density scan. This is done to screen for osteoporosis. You may have this done starting at age 80.  Mammogram. This may be done every 1-2 years. Talk to your health care provider about how often you should have regular mammograms. Talk with your health care provider about your test results, treatment options, and  if necessary, the need for more tests. Vaccines  Your health care provider may recommend certain vaccines, such as:  Influenza vaccine. This is recommended every year.  Tetanus, diphtheria, and acellular pertussis (Tdap, Td) vaccine. You may need a Td booster every 10 years.  Zoster vaccine. You  may need this after age 84.  Pneumococcal 13-valent conjugate (PCV13) vaccine. One dose is recommended after age 74.  Pneumococcal polysaccharide (PPSV23) vaccine. One dose is recommended after age 47. Talk to your health care provider about which screenings and vaccines you need and how often you need them. This information is not intended to replace advice given to you by your health care provider. Make sure you discuss any questions you have with your health care provider. Document Released: 11/09/2015 Document Revised: 07/02/2016 Document Reviewed: 08/14/2015 Elsevier Interactive Patient Education  2017 [place] Prevention in the Home Falls can cause injuries. They can happen to people of all ages. There are many things you can do to make your home safe and to help prevent falls. What can I do on the outside of my home?  Regularly fix the edges of walkways and driveways and fix any cracks.  Remove anything that might make you trip as you walk through a door, such as a raised step or threshold.  Trim any bushes or trees on the path to your home.  Use bright outdoor lighting.  Clear any walking paths of anything that might make someone trip, such as rocks or tools.  Regularly check to see if handrails are loose or broken. Make sure that both sides of any steps have handrails.  Any raised decks and porches should have guardrails on the edges.  Have any leaves, snow, or ice cleared regularly.  Use sand or salt on walking paths during winter.  Clean up any spills in your garage right away. This includes oil or grease spills. What can I do in the bathroom?  Use night lights.  Install grab bars by the toilet and in the tub and shower. Do not use towel bars as grab bars.  Use non-skid mats or decals in the tub or shower.  If you need to sit down in the shower, use a plastic, non-slip stool.  Keep the floor dry. Clean up any water that spills on the floor as soon as  it happens.  Remove soap buildup in the tub or shower regularly.  Attach bath mats securely with double-sided non-slip rug tape.  Do not have throw rugs and other things on the floor that can make you trip. What can I do in the bedroom?  Use night lights.  Make sure that you have a light by your bed that is easy to reach.  Do not use any sheets or blankets that are too big for your bed. They should not hang down onto the floor.  Have a firm chair that has side arms. You can use this for support while you get dressed.  Do not have throw rugs and other things on the floor that can make you trip. What can I do in the kitchen?  Clean up any spills right away.  Avoid walking on wet floors.  Keep items that you use a lot in easy-to-reach places.  If you need to reach something above you, use a strong step stool that has a grab bar.  Keep electrical cords out of the way.  Do not use floor polish or wax that makes floors slippery. If you  must use wax, use non-skid floor wax.  Do not have throw rugs and other things on the floor that can make you trip. What can I do with my stairs?  Do not leave any items on the stairs.  Make sure that there are handrails on both sides of the stairs and use them. Fix handrails that are broken or loose. Make sure that handrails are as long as the stairways.  Check any carpeting to make sure that it is firmly attached to the stairs. Fix any carpet that is loose or worn.  Avoid having throw rugs at the top or bottom of the stairs. If you do have throw rugs, attach them to the floor with carpet tape.  Make sure that you have a light switch at the top of the stairs and the bottom of the stairs. If you do not have them, ask someone to add them for you. What else can I do to help prevent falls?  Wear shoes that:  Do not have high heels.  Have rubber bottoms.  Are comfortable and fit you well.  Are closed at the toe. Do not wear sandals.  If  you use a stepladder:  Make sure that it is fully opened. Do not climb a closed stepladder.  Make sure that both sides of the stepladder are locked into place.  Ask someone to hold it for you, if possible.  Clearly mark and make sure that you can see:  Any grab bars or handrails.  First and last steps.  Where the edge of each step is.  Use tools that help you move around (mobility aids) if they are needed. These include:  Canes.  Walkers.  Scooters.  Crutches.  Turn on the lights when you go into a dark area. Replace any light bulbs as soon as they burn out.  Set up your furniture so you have a clear path. Avoid moving your furniture around.  If any of your floors are uneven, fix them.  If there are any pets around you, be aware of where they are.  Review your medicines with your doctor. Some medicines can make you feel dizzy. This can increase your chance of falling. Ask your doctor what other things that you can do to help prevent falls. This information is not intended to replace advice given to you by your health care provider. Make sure you discuss any questions you have with your health care provider. Document Released: 08/09/2009 Document Revised: 03/20/2016 Document Reviewed: 11/17/2014 Elsevier Interactive Patient Education  2017 [employer].

## 2017-04-10 LAB — URINALYSIS, MICROSCOPIC ONLY
Bacteria, UA: NONE SEEN [HPF]
Casts: NONE SEEN [LPF]
Crystals: NONE SEEN [HPF]
RBC / HPF: NONE SEEN RBC/HPF (ref <=2)
Yeast: NONE SEEN [HPF]

## 2017-04-10 LAB — URINALYSIS, ROUTINE W REFLEX MICROSCOPIC
Bilirubin Urine: NEGATIVE
Glucose, UA: NEGATIVE
Hgb urine dipstick: NEGATIVE
Ketones, ur: NEGATIVE
Nitrite: NEGATIVE
Protein, ur: NEGATIVE
Specific Gravity, Urine: 1.012 (ref 1.001–1.035)
pH: 5 (ref 5.0–8.0)

## 2017-04-13 ENCOUNTER — Other Ambulatory Visit: Payer: Self-pay | Admitting: Internal Medicine

## 2017-04-17 DIAGNOSIS — M2041 Other hammer toe(s) (acquired), right foot: Secondary | ICD-10-CM | POA: Diagnosis not present

## 2017-04-17 DIAGNOSIS — M21612 Bunion of left foot: Secondary | ICD-10-CM | POA: Diagnosis not present

## 2017-04-17 DIAGNOSIS — M21611 Bunion of right foot: Secondary | ICD-10-CM | POA: Diagnosis not present

## 2017-04-24 ENCOUNTER — Ambulatory Visit (INDEPENDENT_AMBULATORY_CARE_PROVIDER_SITE_OTHER): Payer: Medicare Other | Admitting: Internal Medicine

## 2017-04-24 ENCOUNTER — Ambulatory Visit: Payer: Self-pay

## 2017-04-24 ENCOUNTER — Encounter: Payer: Self-pay | Admitting: Internal Medicine

## 2017-04-24 VITALS — BP 120/80 | HR 83 | Temp 98.1°F

## 2017-04-24 DIAGNOSIS — R42 Dizziness and giddiness: Secondary | ICD-10-CM | POA: Diagnosis not present

## 2017-04-24 DIAGNOSIS — L4052 Psoriatic arthritis mutilans: Secondary | ICD-10-CM

## 2017-04-24 DIAGNOSIS — G894 Chronic pain syndrome: Secondary | ICD-10-CM | POA: Diagnosis not present

## 2017-04-24 DIAGNOSIS — E034 Atrophy of thyroid (acquired): Secondary | ICD-10-CM

## 2017-04-24 DIAGNOSIS — G4733 Obstructive sleep apnea (adult) (pediatric): Secondary | ICD-10-CM

## 2017-04-24 MED ORDER — LEVOTHYROXINE SODIUM 88 MCG PO TABS: ORAL_TABLET | ORAL | 3 refills | Status: DC

## 2017-04-24 NOTE — Progress Notes (Signed)
Location:  [facility] Place of Service: OFFICE  Chief Complaint  Patient presents with  . Medical Management of Chronic Issues    Extended Visit    HPI:  76 yo female seen today for extended visit. She reports she was seen in UC last month for epigastric pain. She was Rx H2 blocker/PPI x 2 weeks and weaned herself off. No CP.  Hyperlipidemia - reports hives/myalgias with statins. LDL 150; T chol 216; HDL 48. She takes ASA daily  Hypothyroidism - stable on levothroid. TSH 2.22  OSA - dx in 2004. Last sleep study in 2004. She uses BiPAP 12/10 setting qHS. She was followed by sleep specialist in [place], [place] prior to move. She is now seeing Dr Shelia Media for sleep medicine  Allergic urticaria/dermatitis/hx psoriasis - takes Enbrel injections  Vitamin D deficiency - takes Vit D 2 times per month. Vit D 25OH level 31.9  Osteopenia - last DXA in Dec 2016 and showed some improvement. Takes weekly Vit D  Psoriatic arthritis - takes weekly enbrel injections and daily mobic. She has severe hand/finger joint deformities. Generalized joint pain. Takes norco prn and has started going to pain clinic. Followed by rheumatology Dr Junie Bame at [facility].   Past Medical History:  Diagnosis Date  . H/O total knee replacement 2001   Right  . High cholesterol   . History of tonsillectomy and adenoidectomy 1946  . Psoriatic arthritis ([place]) 1990  . Sleep apnea with use of continuous positive airway pressure (CPAP) 2004  . Thyroid disease     Past Surgical History:  Procedure Laterality Date  . FOOT SURGERY     fused arch in left foot  . SPINE SURGERY    . TOTAL KNEE ARTHROPLASTY Left     Patient Care Team: Gildardo Cranker, DO as PCP - General (Internal Medicine) Tomma Rakers, MD as Referring Physician (Ophthalmology)  Social History   Social History  . Marital status: Married    Spouse name: N/A  . Number of children: N/A  . Years of education: N/A   Occupational  History  . Not on file.   Social History Main Topics  . Smoking status: Former Smoker    Packs/day: 2.00    Years: 18.00    Types: Cigarettes  . Smokeless tobacco: Never Used  . Alcohol use 1.2 - 4.2 oz/week    2 - 7 Standard drinks or equivalent per week     Comment: Social drinker, couple times a month  . Drug use: No  . Sexual activity: Not on file   Other Topics Concern  . Not on file   Social History Narrative   Diet? Normal      Do you drink/eat things with caffeine? Yes, 1 mt. Dew per day      Marital status?              m                      What year were you married? 1988      Do you live in a house, apartment, assisted living, condo, trailer, etc.? Townhouse      Is it one or more stories? 2 story      How many persons live in your home? 2      Do you have any pets in your home? (please list) no      Current or past profession: Is Freight forwarder  Do you exercise?     No (did it for 50 years)                       Type & how often?      Do you have a living will? yes      Do you have a DNR form?        yes                          If not, do you want to discuss one?      Do you have signed POA/HPOA for forms?            reports that she has quit smoking. Her smoking use included Cigarettes. She has a 36.00 pack-year smoking history. She has never used smokeless tobacco. She reports that she drinks about 1.2 - 4.2 oz of alcohol per week . She reports that she does not use drugs.  Family History  Problem Relation Age of Onset  . Heart attack Father   . Heart disease Sister    Family Status  Relation Status  . Father Deceased at age 63       Heart & Polycystic kidney  . Mother Deceased at age 72       ALS  . Sister Alive     Allergies  Allergen Reactions  . Codeine Nausea And Vomiting  . Statins Hives    Myalgias     Medications: Patient's Medications  New Prescriptions   No medications on file  Previous Medications    AMOXICILLIN-CLAVULANATE (AUGMENTIN) 875-125 MG TABLET    Take 1 tablet by mouth 2 (two) times daily. FOR DENTAL APPTS.   ASPIRIN 81 MG TABLET    Take 81 mg by mouth daily.   ETANERCEPT (ENBREL) 50 MG/ML INJECTION    Inject 50 mg into the skin once a week.   HYDROCODONE-ACETAMINOPHEN (NORCO/VICODIN) 5-325 MG TABLET    Take 1 tablet by mouth at bedtime.    LEVOTHYROXINE (SYNTHROID, LEVOTHROID) 88 MCG TABLET    TAKE ONE TABLET BY MOUTH  ONCE DAILY 30 MINUTES  BEFORE BREAKFAST FOR  THYROID.   MELOXICAM (MOBIC) 15 MG TABLET    Take 1 tablet (15 mg total) by mouth daily.  Modified Medications   No medications on file  Discontinued Medications   No medications on file    Review of Systems  Constitutional: Positive for activity change.  Musculoskeletal: Positive for arthralgias, back pain and joint swelling.  All other systems reviewed and are negative.   Vitals:   04/24/17 1446  BP: 120/80  Pulse: 83  Temp: 98.1 F (36.7 C)  TempSrc: Oral  SpO2: 99%   There is no height or weight on file to calculate BMI.  Physical Exam  Constitutional: She is oriented to person, place, and time. She appears well-developed and well-nourished. No distress.  HENT:  Head: Normocephalic and atraumatic.  Right Ear: External ear normal.  Left Ear: External ear normal.  Mouth/Throat: Oropharynx is clear and moist. No oropharyngeal exudate.  TMs dull b/l but no redness or bulging  Eyes: EOM are normal. Pupils are equal, round, and reactive to light. No scleral icterus.  Neck: Normal range of motion. Neck supple. Carotid bruit is not present. No tracheal deviation present. No thyromegaly present.  Cardiovascular: Normal rate, regular rhythm and intact distal pulses.  Exam reveals no gallop and no friction rub.   No  murmur heard. No LE edema b/l. No calf TTP  Pulmonary/Chest: Effort normal and breath sounds normal. No stridor. No respiratory distress. She has no wheezes. She has no rales. She exhibits no  tenderness. Right breast exhibits no inverted nipple, no mass, no nipple discharge, no skin change and no tenderness. Left breast exhibits no inverted nipple, no mass, no nipple discharge, no skin change and no tenderness. Breasts are symmetrical.  No rhonchi  Abdominal: Soft. Bowel sounds are normal. She exhibits no distension and no mass. There is no hepatosplenomegaly or hepatomegaly. There is no tenderness. There is no rebound and no guarding. No hernia.  Genitourinary:  Genitourinary Comments: Deferred due to age  Musculoskeletal: She exhibits edema, tenderness and deformity.  Joint deformities in hand/fingers; upper midline thoracic incisional scar  Lymphadenopathy:    She has no cervical adenopathy.  Neurological: She is alert and oriented to person, place, and time.  Reduced DTR right knee 2/2 TKR  Skin: Skin is warm and dry. No rash noted.  Psychiatric: She has a normal mood and affect. Her behavior is normal. Judgment and thought content normal.  Vitals reviewed.    Labs reviewed: Lab on 04/09/2017  Component Date Value Ref Range Status  . Sodium 04/09/2017 140  135 - 146 mmol/L Final  . Potassium 04/09/2017 4.3  3.5 - 5.3 mmol/L Final  . Chloride 04/09/2017 107  98 - 110 mmol/L Final  . CO2 04/09/2017 22  20 - 31 mmol/L Final  . Glucose, Bld 04/09/2017 105* 65 - 99 mg/dL Final  . BUN 04/09/2017 18  7 - 25 mg/dL Final  . Creat 04/09/2017 1.04* 0.60 - 0.93 mg/dL Final   Comment:   For patients > or = 77 years of age: The upper reference limit for Creatinine is approximately 13% higher for people identified as African-American.     . Total Bilirubin 04/09/2017 0.6  0.2 - 1.2 mg/dL Final  . Alkaline Phosphatase 04/09/2017 74  33 - 130 U/L Final  . AST 04/09/2017 15  10 - 35 U/L Final  . ALT 04/09/2017 14  6 - 29 U/L Final  . Total Protein 04/09/2017 7.6  6.1 - 8.1 g/dL Final  . Albumin 04/09/2017 4.3  3.6 - 5.1 g/dL Final  . Calcium 04/09/2017 9.5  8.6 - 10.4 mg/dL Final   . GFR, Est African American 04/09/2017 60  >=60 mL/min Final  . GFR, Est Non African American 04/09/2017 52* >=60 mL/min Final  . Cholesterol 04/09/2017 213* <200 mg/dL Final  . Triglycerides 04/09/2017 113  <150 mg/dL Final  . HDL 04/09/2017 49* >50 mg/dL Final  . Total CHOL/HDL Ratio 04/09/2017 4.3  <5.0 Ratio Final  . VLDL 04/09/2017 23  <30 mg/dL Final  . LDL Cholesterol 04/09/2017 141* <100 mg/dL Final  . TSH 04/09/2017 1.77  mIU/L Final   Comment:   Reference Range   > or = 20 Years  0.40-4.50   Pregnancy Range First trimester  0.26-2.66 Second trimester 0.55-2.73 Third trimester  0.43-2.91     . WBC 04/09/2017 5.6  3.8 - 10.8 K/uL Final  . RBC 04/09/2017 5.76* 3.80 - 5.10 MIL/uL Final  . Hemoglobin 04/09/2017 15.3  11.7 - 15.5 g/dL Final  . HCT 04/09/2017 47.4* 35.0 - 45.0 % Final  . MCV 04/09/2017 82.3  80.0 - 100.0 fL Final  . MCH 04/09/2017 26.6* 27.0 - 33.0 pg Final  . MCHC 04/09/2017 32.3  32.0 - 36.0 g/dL Final  . RDW 04/09/2017 15.1* 11.0 -  15.0 % Final  . Platelets 04/09/2017 230  140 - 400 K/uL Final  . MPV 04/09/2017 10.4  7.5 - 12.5 fL Final  . Neutro Abs 04/09/2017 3080  1,500 - 7,800 cells/uL Final  . Lymphs Abs 04/09/2017 1904  850 - 3,900 cells/uL Final  . Monocytes Absolute 04/09/2017 616  200 - 950 cells/uL Final  . Eosinophils Absolute 04/09/2017 0* 15 - 500 cells/uL Final  . Basophils Absolute 04/09/2017 0  0 - 200 cells/uL Final  . Neutrophils Relative % 04/09/2017 55  % Final  . Lymphocytes Relative 04/09/2017 34  % Final  . Monocytes Relative 04/09/2017 11  % Final  . Eosinophils Relative 04/09/2017 0  % Final  . Basophils Relative 04/09/2017 0  % Final  . Smear Review 04/09/2017 Criteria for review not met   Final  . Color, Urine 04/09/2017 YELLOW  YELLOW Final  . APPearance 04/09/2017 CLEAR  CLEAR Final  . Specific Gravity, Urine 04/09/2017 1.012  1.001 - 1.035 Final  . pH 04/09/2017 5.0  5.0 - 8.0 Final  . Glucose, UA 04/09/2017 NEGATIVE   NEGATIVE Final  . Bilirubin Urine 04/09/2017 NEGATIVE  NEGATIVE Final  . Ketones, ur 04/09/2017 NEGATIVE  NEGATIVE Final  . Hgb urine dipstick 04/09/2017 NEGATIVE  NEGATIVE Final  . Protein, ur 04/09/2017 NEGATIVE  NEGATIVE Final  . Nitrite 04/09/2017 NEGATIVE  NEGATIVE Final  . Leukocytes, UA 04/09/2017 TRACE* NEGATIVE Final  . Free T4 04/09/2017 1.5  0.8 - 1.8 ng/dL Final  . WBC, UA 04/09/2017 0-5  <=5 WBC/HPF Final  . RBC / HPF 04/09/2017 NONE SEEN  <=2 RBC/HPF Final  . Squamous Epithelial / LPF 04/09/2017 0-5  <=5 HPF Final  . Bacteria, UA 04/09/2017 NONE SEEN  NONE SEEN HPF Final  . Crystals 04/09/2017 NONE SEEN  NONE SEEN HPF Final  . Casts 04/09/2017 NONE SEEN  NONE SEEN LPF Final  . Yeast 04/09/2017 NONE SEEN  NONE SEEN HPF Final    No results found.   Assessment/Plan   ICD-10-CM   1. Chronic pain syndrome - due to #2 G89.4   2. 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 Hypothyroidism due to acquired atrophy of thyroid E03.4   4. Dizziness and giddiness R42   5. OSA treated with BiPAP G47.33    Get records from [place] records from [facility] [address] - urgent care visit  Continue current medications as ordered  Follow up with pain clinic as scheduled. May need pain Rx prior to her appt if she cannot be seen in next 1-2 weeks  Follow up with specialists as scheduled  Follow up in 1 yr for AWV/EV. Fasting labs prior to appt (cbc w diff, cmp, lipid panel, tsh, ua)      Pradyun Ishman S. Perlie Gold  [facility] [address] [place], [place] [address] [phone] Cell (Monday-Friday 8 AM - 5 PM) [phone] After 5 PM and follow prompts

## 2017-04-24 NOTE — Patient Instructions (Addendum)
Get records from [place] records from [facility] on [address] - urgent care visit  Continue current medications as ordered  Follow up with specialists as scheduled  Follow up in 1 yr for AWV/EV. Fasting labs prior to appt

## 2017-05-06 DIAGNOSIS — M2550 Pain in unspecified joint: Secondary | ICD-10-CM | POA: Diagnosis not present

## 2017-05-06 DIAGNOSIS — M545 Low back pain: Secondary | ICD-10-CM | POA: Diagnosis not present

## 2017-05-06 DIAGNOSIS — L409 Psoriasis, unspecified: Secondary | ICD-10-CM | POA: Diagnosis not present

## 2017-05-06 DIAGNOSIS — L4050 Arthropathic psoriasis, unspecified: Secondary | ICD-10-CM | POA: Diagnosis not present

## 2017-05-08 ENCOUNTER — Telehealth: Payer: Self-pay | Admitting: *Deleted

## 2017-05-08 NOTE — Telephone Encounter (Signed)
Joy with Dr. Ginger Organ office called and stated that they are seeing patient and request labwork that has been done at our office. Confirmed in OV note that patient does see provider. Labs faxed to [place] at Fax: [phone]

## 2017-05-28 ENCOUNTER — Other Ambulatory Visit: Payer: Self-pay

## 2017-05-28 MED ORDER — HYDROCODONE-ACETAMINOPHEN 5-325 MG PO TABS: 1.0000 | ORAL_TABLET | Freq: Every day | ORAL | 0 refills | Status: DC

## 2017-05-28 NOTE — Telephone Encounter (Signed)
Patient left message on triage voice mail for refill

## 2017-06-24 DIAGNOSIS — M25511 Pain in right shoulder: Secondary | ICD-10-CM | POA: Diagnosis not present

## 2017-07-07 ENCOUNTER — Telehealth: Payer: Self-pay | Admitting: *Deleted

## 2017-07-07 NOTE — Telephone Encounter (Signed)
Patient called requesting refill on her pain medication. I checked the [place] and it show that [facility] had been prescribing. Also in Dr. [place] last [place] note it stated that patient was going to a pain clinic.   Per Dr. Eulas Post patient is to get Rx from Pain Clinic.   I called patient and informed her that she should get her Rx from pain clinic and she stated that she had Rx refilled on 06/05/17 ([facility]bie [facility]Print[employer]) by [place] and she signed a Conservation officer, nature. She stated that she no longer gets the Rx from [facility] and the pain clinic she was going to closed down and she no longer goes. Would like for you to write her Rx's. She stated "especially after signing Contract." Please Advise.

## 2017-07-07 NOTE — Telephone Encounter (Signed)
No thank you - Rx should have not been signed last month. Void pain contract. She needs to get from rheumatology

## 2017-07-08 ENCOUNTER — Ambulatory Visit (INDEPENDENT_AMBULATORY_CARE_PROVIDER_SITE_OTHER): Payer: Medicare Other | Admitting: Internal Medicine

## 2017-07-08 ENCOUNTER — Encounter: Payer: Self-pay | Admitting: Internal Medicine

## 2017-07-08 VITALS — BP 138/80 | Temp 98.0°F | Resp 12

## 2017-07-08 DIAGNOSIS — L4052 Psoriatic arthritis mutilans: Secondary | ICD-10-CM

## 2017-07-08 DIAGNOSIS — Z23 Encounter for immunization: Secondary | ICD-10-CM

## 2017-07-08 DIAGNOSIS — G894 Chronic pain syndrome: Secondary | ICD-10-CM

## 2017-07-08 MED ORDER — HYDROCODONE-ACETAMINOPHEN 5-325 MG PO TABS: 1.0000 | ORAL_TABLET | Freq: Every day | ORAL | 0 refills | Status: DC

## 2017-07-08 NOTE — Telephone Encounter (Signed)
Noted, patient will be seen today at 2:45 pm

## 2017-07-08 NOTE — Telephone Encounter (Signed)
Patient stated that the Rheumatologist sent her to the pain clinic and they closed. Stated that any pain clinic she goes to is not in [place]. She stated that she is not getting medication from [facility] and she was given a Rx and signed a contract with our office last month, stated she does not understand why she cant get it through [place]. Stated that if your not going to work with her maybe she should seek care elsewhere. Patient wants an appointment to discuss this with Dr. Eulas Post. Appointment scheduled for today.

## 2017-07-08 NOTE — Patient Instructions (Addendum)
May take 1-2 tabs at bedtime for pain  Continue other medications as ordered  Influenza vaccine given today  Follow up as scheduled

## 2017-07-08 NOTE — Progress Notes (Signed)
Patient ID: DIAVIAN FURGASON, female   DOB: 10/20/1941, 76 y.o.   MRN: [id number]    Location:  [facility] Place of Service: OFFICE  Chief Complaint  Patient presents with  . Medication Management    [facility] pain medication. Patient went to see pain managment x 1 and they went out of business. Patient was then referred to another pain management doctor, yet in the meanwhile patient had signed a contract with [facility]. When patient was contacted by her new pain management doctor she told them she was under contract with [facility] and they told her there was no need to see them. Ran out of RX Saturday   . Immunizations    Discuss flu vaccine     HPI:  76 yo female seen today to discuss pain. She has a hx chronic pain 2/2destructive type psoriatic arthritis. Needs pain medication as she has been out x 5 days. She takes 1 tab at night which manages her pain over all. 7-8/10 pain at night. She occasionally has to take 2 tabs when she has been on her feet all day. She states she was seen by pain mx once and that practice went out-of-business. She was then setup to see another clinic, but prior to her appt, she rec'd Rx from this office and signed a contract. When new clinic called, she told them she already had a contract with [place] and they told her she did not need to be seen at the clinic.   Past Medical History:  Diagnosis Date  . H/O total knee replacement 2001   Right  . High cholesterol   . History of tonsillectomy and adenoidectomy 1946  . Psoriatic arthritis ([place]) 1990  . Sleep apnea with use of continuous positive airway pressure (CPAP) 2004  . Thyroid disease     Past Surgical History:  Procedure Laterality Date  . FOOT SURGERY     fused arch in left foot  . SPINE SURGERY    . TOTAL KNEE ARTHROPLASTY Left     Patient Care Team: Gildardo Cranker, DO as PCP - General (Internal Medicine) Tomma Rakers, MD as Referring Physician (Ophthalmology)  Social History   Social History  . Marital  status: Married    Spouse name: N/A  . Number of children: N/A  . Years of education: N/A   Occupational History  . Not on file.   Social History Main Topics  . Smoking status: Former Smoker    Packs/day: 2.00    Years: 18.00    Types: Cigarettes  . Smokeless tobacco: Never Used  . Alcohol use 1.2 - 4.2 oz/week    2 - 7 Standard drinks or equivalent per week     Comment: Social drinker, couple times a month  . Drug use: No  . Sexual activity: Not on file   Other Topics Concern  . Not on file   Social History Narrative   Diet? Normal      Do you drink/eat things with caffeine? Yes, 1 mt. Dew per day      Marital status?              m                      What year were you married? 1988      Do you live in a house, apartment, assisted living, condo, trailer, etc.? Townhouse      Is it one or more stories? 2 story  How many persons live in your home? 2      Do you have any pets in your home? (please list) no      Current or past profession: Is manager      Do you exercise?     No (did it for 50 years)                       Type & how often?      Do you have a living will? yes      Do you have a DNR form?        yes                          If not, do you want to discuss one?      Do you have signed POA/HPOA for forms?            reports that she has quit smoking. Her smoking use included Cigarettes. She has a 36.00 pack-year smoking history. She has never used smokeless tobacco. She reports that she drinks about 1.2 - 4.2 oz of alcohol per week . She reports that she does not use drugs.  Family History  Problem Relation Age of Onset  . Heart attack Father   . Heart disease Sister    Family Status  Relation Status  . Father Deceased at age 58       Heart & Polycystic kidney  . Mother Deceased at age 105       ALS  . Sister Alive     Allergies  Allergen Reactions  . Codeine Nausea And Vomiting  . Statins Hives    Myalgias      Medications: Patient's Medications  New Prescriptions   No medications on file  Previous Medications   AMOXICILLIN-CLAVULANATE (AUGMENTIN) 875-125 MG TABLET    Take 1 tablet by mouth 2 (two) times daily. FOR DENTAL APPTS.   ASPIRIN 81 MG TABLET    Take 81 mg by mouth daily.   ETANERCEPT (ENBREL) 50 MG/ML INJECTION    Inject 50 mg into the skin once a week.   HYDROCODONE-ACETAMINOPHEN (NORCO/VICODIN) 5-325 MG TABLET    Take 1 tablet by mouth at bedtime.   LEVOTHYROXINE (SYNTHROID, LEVOTHROID) 88 MCG TABLET    TAKE ONE TABLET BY MOUTH  ONCE DAILY 30 MINUTES  BEFORE BREAKFAST FOR  THYROID.   MELOXICAM (MOBIC) 15 MG TABLET    Take 15 mg by mouth as needed for pain.  Modified Medications   No medications on file  Discontinued Medications   MELOXICAM (MOBIC) 15 MG TABLET    Take 1 tablet (15 mg total) by mouth daily.    Review of Systems  Musculoskeletal: Positive for arthralgias, back pain, joint swelling and neck pain.  All other systems reviewed and are negative.   Vitals:   07/08/17 1444  BP: 138/80  Resp: 12  Temp: 98 F (36.7 C)  TempSrc: Oral   There is no height or weight on file to calculate BMI.  Physical Exam  Constitutional: She is oriented to person, place, and time. She appears well-developed and well-nourished.  Musculoskeletal: She exhibits edema, tenderness and deformity (multiple small and large joint deformities).  Neurological: She is alert and oriented to person, place, and time.  Skin: Skin is warm and dry. No rash noted.  Psychiatric: She has a normal mood and affect. Her behavior is normal. Judgment and thought content  normal.     Labs reviewed: Lab on 04/09/2017  Component Date Value Ref Range Status  . Sodium 04/09/2017 140  135 - 146 mmol/L Final  . Potassium 04/09/2017 4.3  3.5 - 5.3 mmol/L Final  . Chloride 04/09/2017 107  98 - 110 mmol/L Final  . CO2 04/09/2017 22  20 - 31 mmol/L Final  . Glucose, Bld 04/09/2017 105* 65 - 99 mg/dL Final   . BUN 04/09/2017 18  7 - 25 mg/dL Final  . Creat 04/09/2017 1.04* 0.60 - 0.93 mg/dL Final   Comment:   For patients > or = 77 years of age: The upper reference limit for Creatinine is approximately 13% higher for people identified as African-American.     . Total Bilirubin 04/09/2017 0.6  0.2 - 1.2 mg/dL Final  . Alkaline Phosphatase 04/09/2017 74  33 - 130 U/L Final  . AST 04/09/2017 15  10 - 35 U/L Final  . ALT 04/09/2017 14  6 - 29 U/L Final  . Total Protein 04/09/2017 7.6  6.1 - 8.1 g/dL Final  . Albumin 04/09/2017 4.3  3.6 - 5.1 g/dL Final  . Calcium 04/09/2017 9.5  8.6 - 10.4 mg/dL Final  . GFR, Est African American 04/09/2017 60  >=60 mL/min Final  . GFR, Est Non African American 04/09/2017 52* >=60 mL/min Final  . Cholesterol 04/09/2017 213* <200 mg/dL Final  . Triglycerides 04/09/2017 113  <150 mg/dL Final  . HDL 04/09/2017 49* >50 mg/dL Final  . Total CHOL/HDL Ratio 04/09/2017 4.3  <5.0 Ratio Final  . VLDL 04/09/2017 23  <30 mg/dL Final  . LDL Cholesterol 04/09/2017 141* <100 mg/dL Final  . TSH 04/09/2017 1.77  mIU/L Final   Comment:   Reference Range   > or = 20 Years  0.40-4.50   Pregnancy Range First trimester  0.26-2.66 Second trimester 0.55-2.73 Third trimester  0.43-2.91     . WBC 04/09/2017 5.6  3.8 - 10.8 K/uL Final  . RBC 04/09/2017 5.76* 3.80 - 5.10 MIL/uL Final  . Hemoglobin 04/09/2017 15.3  11.7 - 15.5 g/dL Final  . HCT 04/09/2017 47.4* 35.0 - 45.0 % Final  . MCV 04/09/2017 82.3  80.0 - 100.0 fL Final  . MCH 04/09/2017 26.6* 27.0 - 33.0 pg Final  . MCHC 04/09/2017 32.3  32.0 - 36.0 g/dL Final  . RDW 04/09/2017 15.1* 11.0 - 15.0 % Final  . Platelets 04/09/2017 230  140 - 400 K/uL Final  . MPV 04/09/2017 10.4  7.5 - 12.5 fL Final  . Neutro Abs 04/09/2017 3080  1,500 - 7,800 cells/uL Final  . Lymphs Abs 04/09/2017 1904  850 - 3,900 cells/uL Final  . Monocytes Absolute 04/09/2017 616  200 - 950 cells/uL Final  . Eosinophils Absolute 04/09/2017 0* 15  - 500 cells/uL Final  . Basophils Absolute 04/09/2017 0  0 - 200 cells/uL Final  . Neutrophils Relative % 04/09/2017 55  % Final  . Lymphocytes Relative 04/09/2017 34  % Final  . Monocytes Relative 04/09/2017 11  % Final  . Eosinophils Relative 04/09/2017 0  % Final  . Basophils Relative 04/09/2017 0  % Final  . Smear Review 04/09/2017 Criteria for review not met   Final  . Color, Urine 04/09/2017 YELLOW  YELLOW Final  . APPearance 04/09/2017 CLEAR  CLEAR Final  . Specific Gravity, Urine 04/09/2017 1.012  1.001 - 1.035 Final  . pH 04/09/2017 5.0  5.0 - 8.0 Final  . Glucose, UA 04/09/2017 NEGATIVE  NEGATIVE Final  .  Bilirubin Urine 04/09/2017 NEGATIVE  NEGATIVE Final  . Ketones, ur 04/09/2017 NEGATIVE  NEGATIVE Final  . Hgb urine dipstick 04/09/2017 NEGATIVE  NEGATIVE Final  . Protein, ur 04/09/2017 NEGATIVE  NEGATIVE Final  . Nitrite 04/09/2017 NEGATIVE  NEGATIVE Final  . Leukocytes, UA 04/09/2017 TRACE* NEGATIVE Final  . Free T4 04/09/2017 1.5  0.8 - 1.8 ng/dL Final  . WBC, UA 04/09/2017 0-5  <=5 WBC/HPF Final  . RBC / HPF 04/09/2017 NONE SEEN  <=2 RBC/HPF Final  . Squamous Epithelial / LPF 04/09/2017 0-5  <=5 HPF Final  . Bacteria, UA 04/09/2017 NONE SEEN  NONE SEEN HPF Final  . Crystals 04/09/2017 NONE SEEN  NONE SEEN HPF Final  . Casts 04/09/2017 NONE SEEN  NONE SEEN LPF Final  . Yeast 04/09/2017 NONE SEEN  NONE SEEN HPF Final    No results found.   Assessment/Plan     ICD-10-CM   1. Chronic pain syndrome G89.4 HYDROcodone-acetaminophen (NORCO/VICODIN) 5-325 MG tablet  2. 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 Need for immunization against influenza Z23 Flu Vaccine QUAD 36+ mos IM   May take 1-2 tabs of norco at bedtime for pain  Continue other medications as ordered  Influenza vaccine given today  Follow up as scheduled   Korey Arroyo S. Perlie Gold  [facility]  Adult Medicine [address] [place], [place] [address] [phone] Cell (Monday-Friday 8 AM - 5 PM) [phone] After 5 PM and follow prompts

## 2017-09-04 ENCOUNTER — Other Ambulatory Visit: Payer: Self-pay | Admitting: *Deleted

## 2017-09-04 DIAGNOSIS — G894 Chronic pain syndrome: Secondary | ICD-10-CM

## 2017-09-04 DIAGNOSIS — L4052 Psoriatic arthritis mutilans: Secondary | ICD-10-CM

## 2017-09-04 MED ORDER — HYDROCODONE-ACETAMINOPHEN 5-325 MG PO TABS: 1.0000 | ORAL_TABLET | Freq: Every day | ORAL | 0 refills | Status: DC

## 2017-09-04 NOTE — Telephone Encounter (Signed)
Patient requested and will pick up [place]CCS[place] Database Verified.  

## 2017-09-30 DIAGNOSIS — G43109 Migraine with aura, not intractable, without status migrainosus: Secondary | ICD-10-CM | POA: Diagnosis not present

## 2017-10-05 ENCOUNTER — Institutional Professional Consult (permissible substitution): Payer: Medicare Other | Admitting: Pulmonary Disease

## 2017-10-13 ENCOUNTER — Encounter: Payer: Self-pay | Admitting: Nurse Practitioner

## 2017-10-13 ENCOUNTER — Ambulatory Visit (INDEPENDENT_AMBULATORY_CARE_PROVIDER_SITE_OTHER): Payer: Medicare Other | Admitting: Nurse Practitioner

## 2017-10-13 VITALS — BP 100/70 | HR 76 | Temp 98.4°F | Resp 20 | Ht 65.0 in | Wt 154.2 lb

## 2017-10-13 DIAGNOSIS — J019 Acute sinusitis, unspecified: Secondary | ICD-10-CM

## 2017-10-13 NOTE — Progress Notes (Signed)
Careteam: Patient Care Team: Gildardo Cranker, DO as PCP - General (Internal Medicine) Tomma Rakers, MD as Referring Physician (Ophthalmology)  Advanced Directive information    Allergies  Allergen Reactions  . Codeine Nausea And Vomiting    Weakness and dysequilibrium  . Statins Hives    Myalgias     Chief Complaint  Patient presents with  . Acute Visit    PATIENTS C/O SINUS INFECTION     HPI: Patient is a 76 y.o. female seen in the office today due to sinus issues.  Started yesterday. using alka seltzer flu and cold tablets. Feels like she has post nasal drip but no pressure in her sinuses. Runny nose for several days.  Reports she coughing up yellow, dark brown "crap"    Review of Systems:  Review of Systems  Constitutional: Positive for malaise/fatigue. Negative for chills and fever.  HENT: Positive for congestion (chest) and sore throat. Negative for ear discharge, ear pain, sinus pain and tinnitus.   Respiratory: Positive for cough and sputum production. Negative for shortness of breath and wheezing.   Cardiovascular: Negative for chest pain, palpitations and leg swelling.  Musculoskeletal: Negative for myalgias.  Neurological: Negative for weakness and headaches.    Past Medical History:  Diagnosis Date  . H/O total knee replacement 2001   Right  . High cholesterol   . History of tonsillectomy and adenoidectomy 1946  . Psoriatic arthritis ([place]) 1990  . Sleep apnea with use of continuous positive airway pressure (CPAP) 2004  . Thyroid disease    Past Surgical History:  Procedure Laterality Date  . FOOT SURGERY     fused arch in left foot  . SPINE SURGERY    . TOTAL KNEE ARTHROPLASTY Left    Social History:   reports that she has quit smoking. Her smoking use included cigarettes. She has a 36.00 pack-year smoking history. she has never used smokeless tobacco. She reports that she drinks about 1.2 - 4.2 oz of alcohol per week. She reports that  she does not use drugs.  Family History  Problem Relation Age of Onset  . Heart attack Father   . Heart disease Sister     Medications:   Medication List        Accurate as of 10/13/17 11:26 AM. Always use your most recent med list.          amoxicillin-clavulanate 875-125 MG tablet Commonly known as:  AUGMENTIN   aspirin 81 MG tablet   ENBREL 50 MG/ML injection Generic drug:  etanercept   HYDROcodone-acetaminophen 5-325 MG tablet Commonly known as:  NORCO/VICODIN Take 1-2 tablets at bedtime by mouth.   levothyroxine 88 MCG tablet Commonly known as:  SYNTHROID, LEVOTHROID TAKE ONE TABLET BY MOUTH  ONCE DAILY 30 MINUTES  BEFORE BREAKFAST FOR  THYROID.   meloxicam 15 MG tablet Commonly known as:  MOBIC        Physical Exam:  Vitals:   10/13/17 1119  BP: 100/70  Pulse: 76  Resp: 20  Temp: 98.4 F (36.9 C)  TempSrc: Oral  SpO2: 98%  Weight: 154 lb 3.2 oz (69.9 kg)  Height: 5\' 5"  (1.651 m)   Body mass index is 25.66 kg/m.  Physical Exam  Constitutional: She is oriented to person, place, and time. She appears well-developed and well-nourished. No distress.  HENT:  Head: Normocephalic and atraumatic.  Right Ear: External ear normal.  Left Ear: External ear normal.  Nose: Mucosal edema and rhinorrhea present.  Mouth/Throat:  Mucous membranes are normal. Posterior oropharyngeal erythema (no exudate) present. No oropharyngeal exudate or posterior oropharyngeal edema.  Eyes: Conjunctivae and EOM are normal. Pupils are equal, round, and reactive to light.  Neck: Normal range of motion. Neck supple.  Cardiovascular: Normal rate, regular rhythm and normal heart sounds.  Pulmonary/Chest: Effort normal and breath sounds normal.  Lymphadenopathy:    She has no cervical adenopathy.  Neurological: She is alert and oriented to person, place, and time.  Skin: Skin is warm and dry. She is not diaphoretic.  Psychiatric: She has a normal mood and affect.    Labs  reviewed: Basic Metabolic Panel: Recent Labs    04/09/17 0844  NA 140  K 4.3  CL 107  CO2 22  GLUCOSE 105*  BUN 18  CREATININE 1.04*  CALCIUM 9.5  TSH 1.77   Liver Function Tests: Recent Labs    04/09/17 0844  AST 15  ALT 14  ALKPHOS 74  BILITOT 0.6  PROT 7.6  ALBUMIN 4.3   No results for input(s): LIPASE, AMYLASE in the last 8760 hours. No results for input(s): AMMONIA in the last 8760 hours. CBC: Recent Labs    04/09/17 0844  WBC 5.6  NEUTROABS 3,080  HGB 15.3  HCT 47.4*  MCV 82.3  PLT 230   Lipid Panel: Recent Labs    04/09/17 0844  CHOL 213*  HDL 49*  LDLCALC 141*  TRIG 113  CHOLHDL 4.3   TSH: Recent Labs    04/09/17 0844  TSH 1.77   A1C: No results found for: HGBA1C   Assessment/Plan 1. Acute non-recurrent sinusitis, unspecified location Supportive care at this time.  neti pot twice daily Plain nasal saline spray throughout the day as needed May use tylenol 325 mg 2 tablets every 6 hours as needed aches and pains or sore throat- max of 3000 mg daily humidifier in the home to help with the dry air Mucinex DM by mouth twice daily as needed for cough and congestion with full glass of water  Keep well hydrated- 64 oz a day  Avoid forcefully blowing nose Return precautions discussed  Janett Billow K. Harle Battiest  [facility] [phone] 8 am - 5 pm) [phone] (after hours)

## 2017-10-13 NOTE — Patient Instructions (Addendum)
neti pot twice daily Plain nasal saline spray throughout the day as needed May use tylenol 325 mg 2 tablets every 6 hours as needed aches and pains or sore throat- max 3000 mg in 24 hours.  humidifier in the home to help with the dry air Mucinex DM by mouth twice daily as needed for cough and congestion with full glass of water  Keep well hydrated- at least 64 oz of fluid a day Avoid forcefully blowing nose   Sinusitis, Adult Sinusitis is soreness and inflammation of your sinuses. Sinuses are hollow spaces in the bones around your face. Your sinuses are located:  Around your eyes.  In the middle of your forehead.  Behind your nose.  In your cheekbones.  Your sinuses and nasal passages are lined with a stringy fluid (mucus). Mucus normally drains out of your sinuses. When your nasal tissues become inflamed or swollen, the mucus can become trapped or blocked so air cannot flow through your sinuses. This allows bacteria, viruses, and funguses to grow, which leads to infection. Sinusitis can develop quickly and last for 7?10 days (acute) or for more than 12 weeks (chronic). Sinusitis often develops after a cold. What are the causes? This condition is caused by anything that creates swelling in the sinuses or stops mucus from draining, including:  Allergies.  Asthma.  Bacterial or viral infection.  Abnormally shaped bones between the nasal passages.  Nasal growths that contain mucus (nasal polyps).  Narrow sinus openings.  Pollutants, such as chemicals or irritants in the air.  A foreign object stuck in the nose.  A fungal infection. This is rare.  What increases the risk? The following factors may make you more likely to develop this condition:  Having allergies or asthma.  Having had a recent cold or respiratory tract infection.  Having structural deformities or blockages in your nose or sinuses.  Having a weak immune system.  Doing a lot of swimming or  diving.  Overusing nasal sprays.  Smoking.  What are the signs or symptoms? The main symptoms of this condition are pain and a feeling of pressure around the affected sinuses. Other symptoms include:  Upper toothache.  Earache.  Headache.  Bad breath.  Decreased sense of smell and taste.  A cough that may get worse at night.  Fatigue.  Fever.  Thick drainage from your nose. The drainage is often green and it may contain pus (purulent).  Stuffy nose or congestion.  Postnasal drip. This is when extra mucus collects in the throat or back of the nose.  Swelling and warmth over the affected sinuses.  Sore throat.  Sensitivity to light.  How is this diagnosed? This condition is diagnosed based on symptoms, a medical history, and a physical exam. To find out if your condition is acute or chronic, your health care provider may:  Look in your nose for signs of nasal polyps.  Tap over the affected sinus to check for signs of infection.  View the inside of your sinuses using an imaging device that has a light attached (endoscope).  If your health care provider suspects that you have chronic sinusitis, you may also:  Be tested for allergies.  Have a sample of mucus taken from your nose (nasal culture) and checked for bacteria.  Have a mucus sample examined to see if your sinusitis is related to an allergy.  If your sinusitis does not respond to treatment and it lasts longer than 8 weeks, you may have an MRI  or CT scan to check your sinuses. These scans also help to determine how severe your infection is. In rare cases, a bone biopsy may be done to rule out more serious types of fungal sinus disease. How is this treated? Treatment for sinusitis depends on the cause and whether your condition is chronic or acute. If a virus is causing your sinusitis, your symptoms will go away on their own within 10 days. You may be given medicines to relieve your symptoms,  including:  Topical nasal decongestants. They shrink swollen nasal passages and let mucus drain from your sinuses.  Antihistamines. These drugs block inflammation that is triggered by allergies. This can help to ease swelling in your nose and sinuses.  Topical nasal corticosteroids. These are nasal sprays that ease inflammation and swelling in your nose and sinuses.  Nasal saline washes. These rinses can help to get rid of thick mucus in your nose.  If your condition is caused by bacteria, you will be given an antibiotic medicine. If your condition is caused by a fungus, you will be given an antifungal medicine. Surgery may be needed to correct underlying conditions, such as narrow nasal passages. Surgery may also be needed to remove polyps. Follow these instructions at home: Medicines  Take, use, or apply over-the-counter and prescription medicines only as told by your health care provider. These may include nasal sprays.  If you were prescribed an antibiotic medicine, take it as told by your health care provider. Do not stop taking the antibiotic even if you start to feel better. Hydrate and Humidify  Drink enough water to keep your urine clear or pale yellow. Staying hydrated will help to thin your mucus.  Use a cool mist humidifier to keep the humidity level in your home above 50%.  Inhale steam for 10-15 minutes, 3-4 times a day or as told by your health care provider. You can do this in the bathroom while a hot shower is running.  Limit your exposure to cool or dry air. Rest  Rest as much as possible.  Sleep with your head raised (elevated).  Make sure to get enough sleep each night. General instructions  Apply a warm, moist washcloth to your face 3-4 times a day or as told by your health care provider. This will help with discomfort.  Wash your hands often with soap and water to reduce your exposure to viruses and other germs. If soap and water are not available, use hand  sanitizer.  Do not smoke. Avoid being around people who are smoking (secondhand smoke).  Keep all follow-up visits as told by your health care provider. This is important. Contact a health care provider if:  You have a fever.  Your symptoms get worse.  Your symptoms do not improve within 10 days. Get help right away if:  You have a severe headache.  You have persistent vomiting.  You have pain or swelling around your face or eyes.  You have vision problems.  You develop confusion.  Your neck is stiff.  You have trouble breathing. This information is not intended to replace advice given to you by your health care provider. Make sure you discuss any questions you have with your health care provider. Document Released: 10/13/2005 Document Revised: 06/08/2016 Document Reviewed: 08/08/2015 Elsevier Interactive Patient Education  2017 [employer].

## 2017-10-21 ENCOUNTER — Telehealth: Payer: Self-pay | Admitting: *Deleted

## 2017-10-21 MED ORDER — AMOXICILLIN 500 MG PO CAPS: 1000.0000 mg | ORAL_CAPSULE | Freq: Three times a day (TID) | ORAL | 0 refills | Status: DC

## 2017-10-21 NOTE — Telephone Encounter (Signed)
Patient called and stated that she seen you 10 days ago and No Better. Stated that she is having bloody/Green Congestion. Does not feel good. No fever that she is aware of.  Has done the recommendations with no relief. Patient is requesting something to be called in. Please Advise.

## 2017-10-21 NOTE — Telephone Encounter (Signed)
Patient notified and agreed.  

## 2017-10-21 NOTE — Telephone Encounter (Signed)
Amoxicillin 1000 mg by mouth every 8 hours (3 times a day) sent to pharmacy- for her to take for 7 days (to take entire course even if she starts improving)  Cont to stay well hydrated and to take with food and a probiotic twice daily to avoid GI upset

## 2017-11-03 ENCOUNTER — Other Ambulatory Visit: Payer: Self-pay | Admitting: *Deleted

## 2017-11-03 DIAGNOSIS — L4052 Psoriatic arthritis mutilans: Secondary | ICD-10-CM

## 2017-11-03 DIAGNOSIS — G894 Chronic pain syndrome: Secondary | ICD-10-CM

## 2017-11-03 MED ORDER — HYDROCODONE-ACETAMINOPHEN 5-325 MG PO TABS: 1.0000 | ORAL_TABLET | Freq: Every day | ORAL | 0 refills | Status: DC

## 2017-11-03 NOTE — Telephone Encounter (Signed)
Patient requested refill [facility] Database Verified Pharmacy Confirmed Pended Rx and sent to Dr. Carter for approval.  

## 2017-11-05 DIAGNOSIS — B351 Tinea unguium: Secondary | ICD-10-CM | POA: Diagnosis not present

## 2017-11-05 DIAGNOSIS — L97521 Non-pressure chronic ulcer of other part of left foot limited to breakdown of skin: Secondary | ICD-10-CM | POA: Diagnosis not present

## 2017-11-09 DIAGNOSIS — M2550 Pain in unspecified joint: Secondary | ICD-10-CM | POA: Diagnosis not present

## 2017-11-09 DIAGNOSIS — M1990 Unspecified osteoarthritis, unspecified site: Secondary | ICD-10-CM | POA: Diagnosis not present

## 2017-11-09 DIAGNOSIS — L409 Psoriasis, unspecified: Secondary | ICD-10-CM | POA: Diagnosis not present

## 2017-11-09 DIAGNOSIS — L4050 Arthropathic psoriasis, unspecified: Secondary | ICD-10-CM | POA: Diagnosis not present

## 2017-11-09 DIAGNOSIS — M545 Low back pain: Secondary | ICD-10-CM | POA: Diagnosis not present

## 2017-11-10 ENCOUNTER — Encounter: Payer: Self-pay | Admitting: Pulmonary Disease

## 2017-11-10 ENCOUNTER — Ambulatory Visit (INDEPENDENT_AMBULATORY_CARE_PROVIDER_SITE_OTHER): Payer: Medicare Other | Admitting: Pulmonary Disease

## 2017-11-10 VITALS — BP 128/80 | HR 102 | Ht 65.0 in | Wt 152.0 lb

## 2017-11-10 DIAGNOSIS — G4733 Obstructive sleep apnea (adult) (pediatric): Secondary | ICD-10-CM

## 2017-11-10 NOTE — Progress Notes (Signed)
   Subjective:    Patient ID: Sydney Reid, female    DOB: [birth year], 77 y.o.   MRN: [id number]  HPI    Review of Systems  Constitutional: Negative for fever and unexpected weight change.  HENT: Positive for congestion. Negative for dental problem, ear pain, nosebleeds, postnasal drip, rhinorrhea, sinus pressure, sneezing, sore throat and trouble swallowing.   Eyes: Positive for itching. Negative for redness.  Respiratory: Positive for cough and shortness of breath. Negative for chest tightness and wheezing.   Cardiovascular: Negative for palpitations and leg swelling.  Gastrointestinal: Negative for nausea and vomiting.  Genitourinary: Negative for dysuria.  Musculoskeletal: Negative for joint swelling.  Skin: Negative for rash.  Allergic/Immunologic: Positive for environmental allergies and immunocompromised state. Negative for food allergies.  Neurological: Negative for headaches.  Hematological: Does not bruise/bleed easily.  Psychiatric/Behavioral: Negative for dysphoric mood. The patient is not nervous/anxious.        Objective:   Physical Exam        Assessment & Plan:

## 2017-11-10 NOTE — Progress Notes (Signed)
[facility]  Chief Complaint  Patient presents with  . sleep consult    Pt referred by Dr. Deland Pretty MD. Pt uses cpap machine for over 15 years, needs to establish new sleep physician. Pt in need of new machine, mask, and supplies. Pt thinks may need adjustment in settings in pressure on her machine    Vital signs: BP 128/80 (BP Location: Left Arm, Cuff Size: Normal)   Pulse (!) 102   Ht 5\' 5"  (1.651 m)   Wt 152 lb (68.9 kg)   SpO2 100%   BMI 25.29 kg/m   History of Present Illness: Sydney Reid is a 77 y.o. female for evaluation of sleep problems.  She had a sleep study years ago.  She was started on Bipap.  It isn't clear whether she was tried on CPAP first.  She uses [facility] as her DME.  She has difficulty getting supplies.  She has to wake up every two hours to reset her machine because she feels the pressure is too high.  She has been using nasal pillows mask.  She feels the mask is leaking.  Her husband is concerned that her tube needs to be replaced.  She goes to sleep at 11 pm.  She falls asleep after 10 minutes.  She wakes up 1 or 2 times to use the bathroom.  She gets out of bed at 8 am.  She feels okay in the morning.  She denies morning headache.  She does not use anything to help her fall sleep or stay awake.  She denies sleep walking, sleep talking, bruxism, or nightmares.  There is no history of restless legs.  She denies sleep hallucinations, sleep paralysis, or cataplexy.  The Epworth score is 6 out of 24.    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 - she is  compliant with Bipap and reports benefit - she is having trouble with mask leak and feels pressure is too high - will have [facility] get her new supplies and adjust her pressure to 10/6 cm H2O - will then determine if she needs to try a different mask - she might also need to get a new machine - if she needs a new machine, then she would likely need a new sleep study to document sleep apnea and then be tried on CPAP first - will try to get a copy of her previous sleep study    Patient Instructions  Will arrange for new Bipap mask and supplies, and have [facility] change your Bipap setting to 10/6 cm H2O  Follow up in 2 months    Chesley Mires, MD [place] 11/10/2017, 11:20 AM Pager:  [phone]  Flow Sheet  Pulmonary tests:  Sleep tests: Bipap 08/12/17 to 1/10/30/17 >> used on 90 of 90 nights with average 8 hrs 23 min.  Average AHI 1.5 with Bipap 11/9 cm H2O  Cardiac tests: Echo 05/20/16 >> EF 60 to 65%, grade 1 DD  Review of Systems: Constitutional: Negative for fever and unexpected weight change.  HENT: Positive for congestion. Negative for dental problem, ear pain, nosebleeds, postnasal  drip, rhinorrhea, sinus pressure, sneezing, sore throat and trouble swallowing.   Eyes: Positive for itching. Negative for redness.  Respiratory: Positive for cough and shortness of breath. Negative for chest tightness and wheezing.   Cardiovascular: Negative for palpitations and leg swelling.  Gastrointestinal: Negative for nausea and vomiting.  Genitourinary: Negative for dysuria.  Musculoskeletal: Negative for joint swelling.  Skin: Negative for rash.  Allergic/Immunologic: Positive for environmental allergies and immunocompromised state. Negative for food allergies.  Neurological: Negative for headaches.  Hematological: Does not bruise/bleed easily.  Psychiatric/Behavioral: Negative for dysphoric mood. The patient is not nervous/anxious.    Past Medical History: She   has a past medical history of H/O total knee replacement (2001), High cholesterol, History of tonsillectomy and adenoidectomy (1946), Psoriatic arthritis ([place]) (1990), Sleep apnea with use of continuous positive airway pressure (CPAP) (2004), and Thyroid disease.  Past Surgical History: She  has a past surgical history that includes Total knee arthroplasty (Left); Spine surgery; and Foot surgery.  Family History: Her family history includes Heart attack in her father; Heart disease in her sister.  Social History: She  reports that she quit smoking about 44 years ago. Her smoking use included cigarettes. She has a 36.00 pack-year smoking history. she has never used smokeless tobacco. She reports that she drinks about 1.2 - 4.2 oz of alcohol per week. She reports that she does not use drugs.  Medications: Allergies as of 11/10/2017      Reactions   Codeine Nausea And Vomiting   Weakness and dysequilibrium   Statins Hives   Myalgias      Medication List        Accurate as of 11/10/17 11:20 AM. Always use your most recent med list.          ENBREL 50 MG/ML injection Generic drug:  etanercept Inject 50 mg into the skin once a week.   HYDROcodone-acetaminophen 5-325 MG tablet Commonly known as:  NORCO/VICODIN Take 1-2 tablets by mouth at bedtime.   levothyroxine 88 MCG tablet Commonly known as:  SYNTHROID, LEVOTHROID TAKE ONE TABLET BY MOUTH  ONCE DAILY 30 MINUTES  BEFORE BREAKFAST FOR  THYROID.   omeprazole 20 MG capsule Commonly known as:  PRILOSEC Take 20 mg by mouth daily.

## 2017-11-10 NOTE — Patient Instructions (Signed)
Will arrange for new Bipap mask and supplies, and have [facility] change your Bipap setting to 10/6 cm H2O  Follow up in 2 months

## 2017-11-17 ENCOUNTER — Telehealth: Payer: Self-pay | Admitting: Pulmonary Disease

## 2017-11-17 NOTE — Telephone Encounter (Signed)
States patient has been without equipment since September 2018.

## 2017-11-17 NOTE — Telephone Encounter (Signed)
Called and spoke to [place] she stated they had the order and gave me a number for pt to call for cpap supplies I have called and spoke to the patient spouse and gave him [phone]

## 2017-11-18 NOTE — Telephone Encounter (Signed)
Pt was a transfer from [facility] in [place] and she neds to call the call center@ [phone] to get her new supplies Joellen [place]

## 2017-11-18 NOTE — Telephone Encounter (Signed)
I spoke to both the pt and her husband they will calll the # provided and give them the info they need for her to get supplies Sydney Reid

## 2017-12-14 DIAGNOSIS — Z1231 Encounter for screening mammogram for malignant neoplasm of breast: Secondary | ICD-10-CM | POA: Diagnosis not present

## 2017-12-18 DIAGNOSIS — N6011 Diffuse cystic mastopathy of right breast: Secondary | ICD-10-CM | POA: Diagnosis not present

## 2017-12-25 ENCOUNTER — Ambulatory Visit (INDEPENDENT_AMBULATORY_CARE_PROVIDER_SITE_OTHER): Payer: Medicare Other | Admitting: Nurse Practitioner

## 2017-12-25 ENCOUNTER — Encounter: Payer: Self-pay | Admitting: Nurse Practitioner

## 2017-12-25 VITALS — BP 130/70 | HR 110 | Temp 102.4°F | Wt 153.0 lb

## 2017-12-25 DIAGNOSIS — R509 Fever, unspecified: Secondary | ICD-10-CM | POA: Diagnosis not present

## 2017-12-25 DIAGNOSIS — J101 Influenza due to other identified influenza virus with other respiratory manifestations: Secondary | ICD-10-CM | POA: Diagnosis not present

## 2017-12-25 LAB — POCT INFLUENZA A/B
Influenza A, POC: POSITIVE — AB
Influenza B, POC: NEGATIVE

## 2017-12-25 MED ORDER — OSELTAMIVIR PHOSPHATE 75 MG PO CAPS: 75.0000 mg | ORAL_CAPSULE | Freq: Two times a day (BID) | ORAL | 0 refills | Status: DC

## 2017-12-25 NOTE — Progress Notes (Signed)
Careteam: Patient Care Team: Gildardo Cranker, DO as PCP - General (Internal Medicine) Tomma Rakers, MD as Referring Physician (Ophthalmology)  Advanced Directive information    Allergies  Allergen Reactions  . Codeine Nausea And Vomiting    Weakness and dysequilibrium  . Statins Hives    Myalgias     Chief Complaint  Patient presents with  . Acute Visit    fever x2 days     HPI: Patient is a 77 y.o. female seen in the office today due to fever.  Started yesterday with fever 101. Today worse at 46 Saw someone Tuesday who was sick and she said she did not have the flu but she suspects she did now all who were around here are sick.  Head full of congestion which she feels like it is making her dizzy; slight headache. Was having some shortness of breath yesterday but none today. No nausea or vomiting.  Has been drinking a lot of fluid.   Review of Systems:  Review of Systems  Constitutional: Positive for chills, fever and malaise/fatigue.  HENT: Positive for congestion, sinus pain and sore throat. Negative for ear discharge, ear pain, nosebleeds and tinnitus.   Respiratory: Positive for cough. Negative for sputum production, shortness of breath and wheezing.   Cardiovascular: Negative for chest pain and palpitations.  Gastrointestinal: Negative for abdominal pain, constipation, diarrhea, nausea and vomiting.  Neurological: Positive for dizziness, weakness and headaches.    Past Medical History:  Diagnosis Date  . H/O total knee replacement 2001   Right  . High cholesterol   . History of tonsillectomy and adenoidectomy 1946  . Psoriatic arthritis ([place]) 1990  . Sleep apnea with use of continuous positive airway pressure (CPAP) 2004  . Thyroid disease    Past Surgical History:  Procedure Laterality Date  . FOOT SURGERY     fused arch in left foot  . SPINE SURGERY    . TOTAL KNEE ARTHROPLASTY Left    Social History:   reports that she quit smoking about  44 years ago. Her smoking use included cigarettes. She has a 36.00 pack-year smoking history. she has never used smokeless tobacco. She reports that she drinks about 1.2 - 4.2 oz of alcohol per week. She reports that she does not use drugs.  Family History  Problem Relation Age of Onset  . Heart attack Father   . Heart disease Sister     Medications: Patient's Medications  New Prescriptions   No medications on file  Previous Medications   ETANERCEPT (ENBREL) 50 MG/ML INJECTION    Inject 50 mg into the skin once a week.   HYDROCODONE-ACETAMINOPHEN (NORCO/VICODIN) 5-325 MG TABLET    Take 1-2 tablets by mouth at bedtime.   LEVOTHYROXINE (SYNTHROID, LEVOTHROID) 88 MCG TABLET    TAKE ONE TABLET BY MOUTH  ONCE DAILY 30 MINUTES  BEFORE BREAKFAST FOR  THYROID.   OMEPRAZOLE (PRILOSEC) 20 MG CAPSULE    Take 20 mg by mouth daily.  Modified Medications   No medications on file  Discontinued Medications   No medications on file     Physical Exam:  Vitals:   12/25/17 0934  BP: 130/70  Pulse: (!) 110  Temp: (!) 102.4 F (39.1 C)  TempSrc: Oral  SpO2: 97%  Weight: 153 lb (69.4 kg)   Body mass index is 25.46 kg/m.  Physical Exam  Constitutional: She is oriented to person, place, and time. She appears well-developed and well-nourished. No distress.  HENT:  Head: Normocephalic and atraumatic.  Right Ear: External ear normal.  Left Ear: External ear normal.  Nose: Mucosal edema and rhinorrhea present.  Mouth/Throat: Mucous membranes are normal. Posterior oropharyngeal erythema (no exudate) present. No oropharyngeal exudate or posterior oropharyngeal edema.  Eyes: Conjunctivae and EOM are normal. Pupils are equal, round, and reactive to light.  Neck: Normal range of motion. Neck supple.  Cardiovascular: Normal rate, regular rhythm and normal heart sounds.  Pulmonary/Chest: Effort normal and breath sounds normal.  Lymphadenopathy:    She has no cervical adenopathy.  Neurological: She  is alert and oriented to person, place, and time.  Skin: Skin is warm and dry. She is not diaphoretic.  Psychiatric: She has a normal mood and affect.    Labs reviewed: Basic Metabolic Panel: Recent Labs    04/09/17 0844  NA 140  K 4.3  CL 107  CO2 22  GLUCOSE 105*  BUN 18  CREATININE 1.04*  CALCIUM 9.5  TSH 1.77   Liver Function Tests: Recent Labs    04/09/17 0844  AST 15  ALT 14  ALKPHOS 74  BILITOT 0.6  PROT 7.6  ALBUMIN 4.3   No results for input(s): LIPASE, AMYLASE in the last 8760 hours. No results for input(s): AMMONIA in the last 8760 hours. CBC: Recent Labs    04/09/17 0844  WBC 5.6  NEUTROABS 3,080  HGB 15.3  HCT 47.4*  MCV 82.3  PLT 230   Lipid Panel: Recent Labs    04/09/17 0844  CHOL 213*  HDL 49*  LDLCALC 141*  TRIG 113  CHOLHDL 4.3   TSH: Recent Labs    04/09/17 0844  TSH 1.77   A1C: No results found for: HGBA1C   Assessment/Plan 1. Fever, unspecified fever cause - POCT Influenza A/B- positive   2. Influenza A - positive influenza test  -encouraged proper hydration- to increase fluids Making sure she is eating proper nutrition.  - oseltamivir (TAMIFLU) 75 MG capsule; Take 1 capsule (75 mg total) by mouth 2 (two) times daily.  Dispense: 10 capsule; Refill: 0 -husband with her today encouraged him to call his PCP to let him know his wife tested positive for influenza.  -mucinex DM by mouth twice daily with full glass of water for cough and congestion -to use saline in nares as needed for nasal congestion.   Strict follow up precautions given.  Carlos American. Harle Battiest  [facility] [phone] 8 am - 5 pm) [phone] (after hours)

## 2017-12-25 NOTE — Patient Instructions (Addendum)
Tylenol 650 mg by mouth every 6 hours as needed for fever- 3000 mg in 24 hours of tylenol  Can alternate with ibuprofen (advil)  400 mg every 6 hours as needed   Saline in nose to help with nasal congestion mucinex DM by mouth twice daily with full glass of water for 7 days   Start tamiflu twice daily for 5 days    Influenza, Adult Influenza ("the flu") is an infection in the lungs, nose, and throat (respiratory tract). It is caused by a virus. The flu causes many common cold symptoms, as well as a high fever and body aches. It can make you feel very sick. The flu spreads easily from person to person (is contagious). Getting a flu shot (influenza vaccination) every year is the best way to prevent the flu. Follow these instructions at home:  Take over-the-counter and prescription medicines only as told by your doctor.  Use a cool mist humidifier to add moisture (humidity) to the air in your home. This can make it easier to breathe.  Rest as needed.  Drink enough fluid to keep your pee (urine) clear or pale yellow.  Cover your mouth and nose when you cough or sneeze.  Wash your hands with soap and water often, especially after you cough or sneeze. If you cannot use soap and water, use hand sanitizer.  Stay home from work or school as told by your doctor. Unless you are visiting your doctor, try to avoid leaving home until your fever has been gone for 24 hours without the use of medicine.  Keep all follow-up visits as told by your doctor. This is important. How is this prevented?  Getting a yearly (annual) flu shot is the best way to avoid getting the flu. You may get the flu shot in late summer, fall, or winter. Ask your doctor when you should get your flu shot.  Wash your hands often or use hand sanitizer often.  Avoid contact with people who are sick during cold and flu season.  Eat healthy foods.  Drink plenty of fluids.  Get enough sleep.  Exercise regularly. Contact a  doctor if:  You get new symptoms.  You have: ? Chest pain. ? Watery poop (diarrhea). ? A fever.  Your cough gets worse.  You start to have more mucus.  You feel sick to your stomach (nauseous).  You throw up (vomit). Get help right away if:  You start to be short of breath or have trouble breathing.  Your skin or nails turn a bluish color.  You have very bad pain or stiffness in your neck.  You get a sudden headache.  You get sudden pain in your face or ear.  You cannot stop throwing up. This information is not intended to replace advice given to you by your health care provider. Make sure you discuss any questions you have with your health care provider. Document Released: 07/22/2008 Document Revised: 03/20/2016 Document Reviewed: 08/07/2015 Elsevier Interactive Patient Education  2017 [employer].

## 2017-12-29 ENCOUNTER — Ambulatory Visit: Payer: Self-pay | Admitting: Internal Medicine

## 2018-01-01 ENCOUNTER — Other Ambulatory Visit: Payer: Self-pay | Admitting: *Deleted

## 2018-01-01 DIAGNOSIS — L4052 Psoriatic arthritis mutilans: Secondary | ICD-10-CM

## 2018-01-01 DIAGNOSIS — G894 Chronic pain syndrome: Secondary | ICD-10-CM

## 2018-01-01 MED ORDER — HYDROCODONE-ACETAMINOPHEN 5-325 MG PO TABS: 1.0000 | ORAL_TABLET | Freq: Every day | ORAL | 0 refills | Status: DC

## 2018-01-01 NOTE — Telephone Encounter (Signed)
Last filled 11/03/2017 Database checked and verified

## 2018-01-19 ENCOUNTER — Encounter: Payer: Self-pay | Admitting: Pulmonary Disease

## 2018-01-19 ENCOUNTER — Ambulatory Visit (INDEPENDENT_AMBULATORY_CARE_PROVIDER_SITE_OTHER): Payer: Medicare Other | Admitting: Pulmonary Disease

## 2018-01-19 VITALS — BP 118/64 | HR 92 | Ht 65.5 in | Wt 152.6 lb

## 2018-01-19 DIAGNOSIS — G4733 Obstructive sleep apnea (adult) (pediatric): Secondary | ICD-10-CM | POA: Diagnosis not present

## 2018-01-19 DIAGNOSIS — R682 Dry mouth, unspecified: Secondary | ICD-10-CM

## 2018-01-19 NOTE — Patient Instructions (Signed)
Try increasing temperature setting on your Bipap humidifier Will adjust Bipap setting to 10/8 cm H2O Continue using Biotene Will arrange for chin strap for Bipap mask  Email using MyChart if you are still having trouble after these changes  Follow up in 1 year

## 2018-01-19 NOTE — Progress Notes (Signed)
[name]  Chief Complaint  Patient presents with  . Follow-up    BIPAP wears avg. 8 hrs. each night-since mid Jan. feels very dry,wakes up feels like choking,waking up very often    Vital signs: BP 118/64 (BP Location: Left Arm, Cuff Size: Normal)   Pulse 92   Ht 5' 5.5" (1.664 m)   Wt 152 lb 9.6 oz (69.2 kg)   SpO2 97%   BMI 25.01 kg/m   History of Present Illness: Sydney Reid is a 77 y.o. female with obstructive sleep apnea.  She uses nasal pillows.  Bipap helps her sleep.  She feels rested.  Gets about 8 hrs sleep per night.  Has noticed getting terrible mouth dryness and then waking up feeling like she can't breath.  She has been using biotene.  She turned temperature on humidifier down recently because she was concerned she was burning through too much water in her machine.  She doesn't think she could use a full face mask.  Physical Exam:  General - pleasant Eyes - pupils reactive ENT - no sinus tenderness, no oral exudate, no LAN, MP 4, narrow jaw, over bite Cardiac - regular, no murmur Chest - no wheeze, rales Abd - soft, non tender Ext - severe deformities of hands b/l with telescoping digits Skin - no rashes Neuro - normal strength Psych - normal mood   Assessment/Plan:  Obstructive sleep apnea. - she has been tried on CPAP before and failed therapy with CPAP - she is compliant with Bipap and reports benefit - she continues to have mouth dryness - will change her Bipap to 10/8 cm H2O - will arrange for chin strap and continue nasal pillows mask - advised her to increase temperature setting for her Bipap humidifer - continue using Biotene   Patient Instructions  Try increasing temperature setting on your Bipap humidifier Will adjust Bipap setting to 10/8 cm H2O Continue using Biotene Will arrange for chin strap for Bipap mask  Email using MyChart if you are still having trouble after these  changes  Follow up in 1 year    Chesley Mires, MD [place] 01/19/2018, 10:31 AM Pager:  [phone]  Flow Sheet  Sleep tests: Bipap 08/12/17 to 1/10/30/17 >> used on 90 of 90 nights with average 8 hrs 23 min.  Average AHI 1.5 with Bipap 11/9 cm H2O Bipap 12/20/17 to 01/18/18 >> used on 30 of 30 nights with average 7 hrs 58 min.  Average AHI 2.2 with Bipap 11/9 cm H2O  Cardiac tests: Echo 05/20/16 >> EF 60 to 65%, grade 1 DD  Past Medical History: She  has a past medical history of H/O total knee replacement (2001), High cholesterol, History of tonsillectomy and adenoidectomy (1946), Psoriatic arthritis ([place]) (1990), Sleep apnea with use of continuous positive airway pressure (CPAP) (2004), and Thyroid disease.  Past Surgical History: She  has a past surgical history that includes Total knee arthroplasty (Left); Spine surgery; and Foot surgery.  Family History: Her family history includes Heart attack in her father; Heart disease in her sister.  Social History: She  reports that she quit smoking about 44 years ago. Her smoking use included cigarettes. She has a 36.00 pack-year smoking history. She has never used smokeless tobacco. She reports that she drinks about 1.2 - 4.2 oz of alcohol per week. She reports that she does not use drugs.  Medications: Allergies as of 01/19/2018      Reactions  Codeine Nausea And Vomiting   Weakness and dysequilibrium   Statins Hives   Myalgias      Medication List        Accurate as of 01/19/18 10:31 AM. Always use your most recent med list.          ENBREL 50 MG/ML injection Generic drug:  etanercept Inject 50 mg into the skin once a week.   HYDROcodone-acetaminophen 5-325 MG tablet Commonly known as:  NORCO/VICODIN Take 1-2 tablets by mouth at bedtime.   levothyroxine 88 MCG tablet Commonly known as:  SYNTHROID, LEVOTHROID TAKE ONE TABLET BY MOUTH  ONCE DAILY 30 MINUTES  BEFORE BREAKFAST FOR  THYROID.    omeprazole 20 MG capsule Commonly known as:  PRILOSEC Take 20 mg by mouth daily. Taking Monday,Wednesday, Friday 1 capsule by mouth

## 2018-02-04 DIAGNOSIS — B351 Tinea unguium: Secondary | ICD-10-CM | POA: Diagnosis not present

## 2018-02-04 DIAGNOSIS — I70203 Unspecified atherosclerosis of native arteries of extremities, bilateral legs: Secondary | ICD-10-CM | POA: Diagnosis not present

## 2018-02-04 DIAGNOSIS — M79672 Pain in left foot: Secondary | ICD-10-CM | POA: Diagnosis not present

## 2018-02-04 DIAGNOSIS — L852 Keratosis punctata (palmaris et plantaris): Secondary | ICD-10-CM | POA: Diagnosis not present

## 2018-02-19 ENCOUNTER — Emergency Department (HOSPITAL_COMMUNITY): Payer: Medicare Other

## 2018-02-19 ENCOUNTER — Other Ambulatory Visit: Payer: Self-pay

## 2018-02-19 ENCOUNTER — Emergency Department (HOSPITAL_COMMUNITY)
Admission: EM | Admit: 2018-02-19 | Discharge: 2018-02-19 | Disposition: A | Discharge status: Home / Self Care | Payer: Medicare Other | Attending: Emergency Medicine | Admitting: Emergency Medicine

## 2018-02-19 ENCOUNTER — Encounter (HOSPITAL_COMMUNITY): Payer: Self-pay | Admitting: *Deleted

## 2018-02-19 DIAGNOSIS — G894 Chronic pain syndrome: Secondary | ICD-10-CM

## 2018-02-19 DIAGNOSIS — Y939 Activity, unspecified: Secondary | ICD-10-CM | POA: Diagnosis not present

## 2018-02-19 DIAGNOSIS — L4052 Psoriatic arthritis mutilans: Secondary | ICD-10-CM

## 2018-02-19 DIAGNOSIS — S42202A Unspecified fracture of upper end of left humerus, initial encounter for closed fracture: Secondary | ICD-10-CM | POA: Diagnosis not present

## 2018-02-19 DIAGNOSIS — E7800 Pure hypercholesterolemia, unspecified: Secondary | ICD-10-CM | POA: Diagnosis not present

## 2018-02-19 DIAGNOSIS — Z79899 Other long term (current) drug therapy: Secondary | ICD-10-CM | POA: Insufficient documentation

## 2018-02-19 DIAGNOSIS — E785 Hyperlipidemia, unspecified: Secondary | ICD-10-CM | POA: Diagnosis not present

## 2018-02-19 DIAGNOSIS — Y92015 Private garage of single-family (private) house as the place of occurrence of the external cause: Secondary | ICD-10-CM | POA: Insufficient documentation

## 2018-02-19 DIAGNOSIS — Z87891 Personal history of nicotine dependence: Secondary | ICD-10-CM | POA: Diagnosis not present

## 2018-02-19 DIAGNOSIS — Y998 Other external cause status: Secondary | ICD-10-CM | POA: Diagnosis not present

## 2018-02-19 DIAGNOSIS — M25522 Pain in left elbow: Secondary | ICD-10-CM | POA: Diagnosis not present

## 2018-02-19 DIAGNOSIS — S4992XA Unspecified injury of left shoulder and upper arm, initial encounter: Secondary | ICD-10-CM | POA: Diagnosis present

## 2018-02-19 DIAGNOSIS — W010XXA Fall on same level from slipping, tripping and stumbling without subsequent striking against object, initial encounter: Secondary | ICD-10-CM | POA: Insufficient documentation

## 2018-02-19 DIAGNOSIS — S42295A Other nondisplaced fracture of upper end of left humerus, initial encounter for closed fracture: Secondary | ICD-10-CM | POA: Diagnosis not present

## 2018-02-19 DIAGNOSIS — S59902A Unspecified injury of left elbow, initial encounter: Secondary | ICD-10-CM | POA: Diagnosis not present

## 2018-02-19 IMAGING — CR DG SHOULDER 2+V*L*
2 series · 2 of 2 positions shown · non-contrast
Comparison: None.

CLINICAL DATA: Fall, left shoulder pain

EXAM:
LEFT SHOULDER - 2+ VIEW

[w shoulder external left]
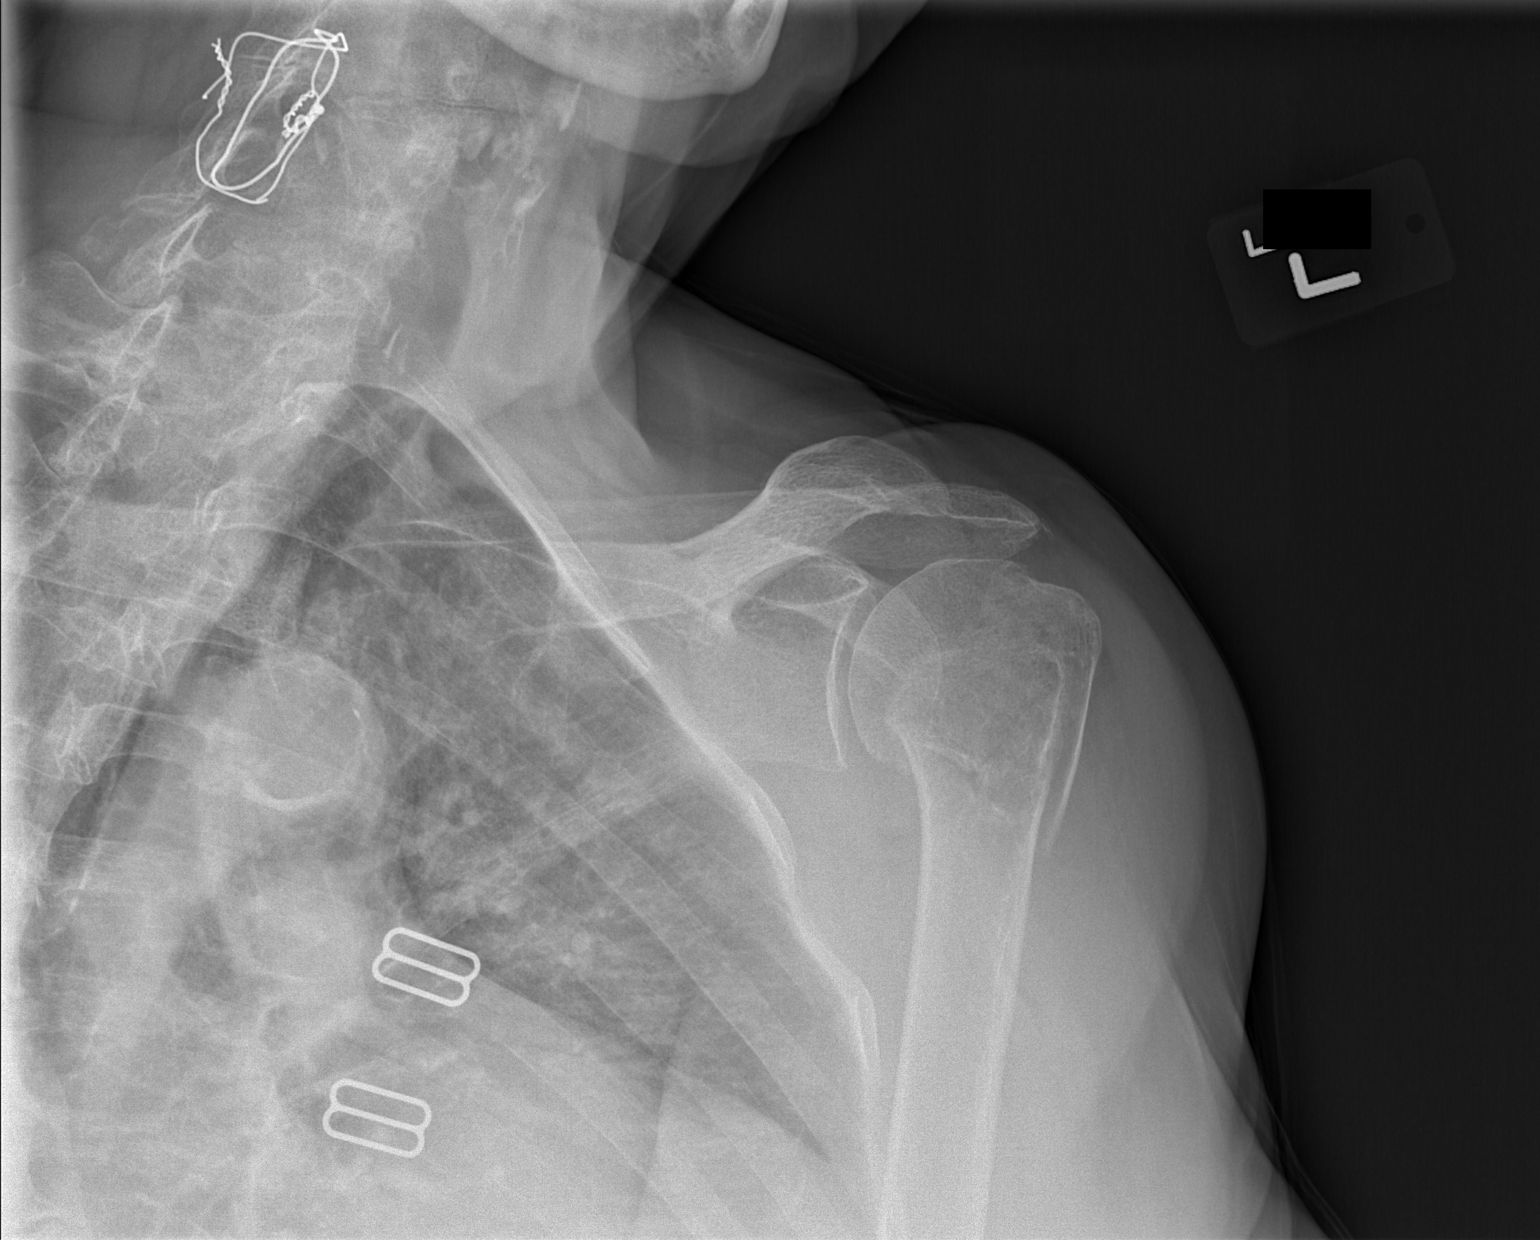

[w shoulder y-view left]
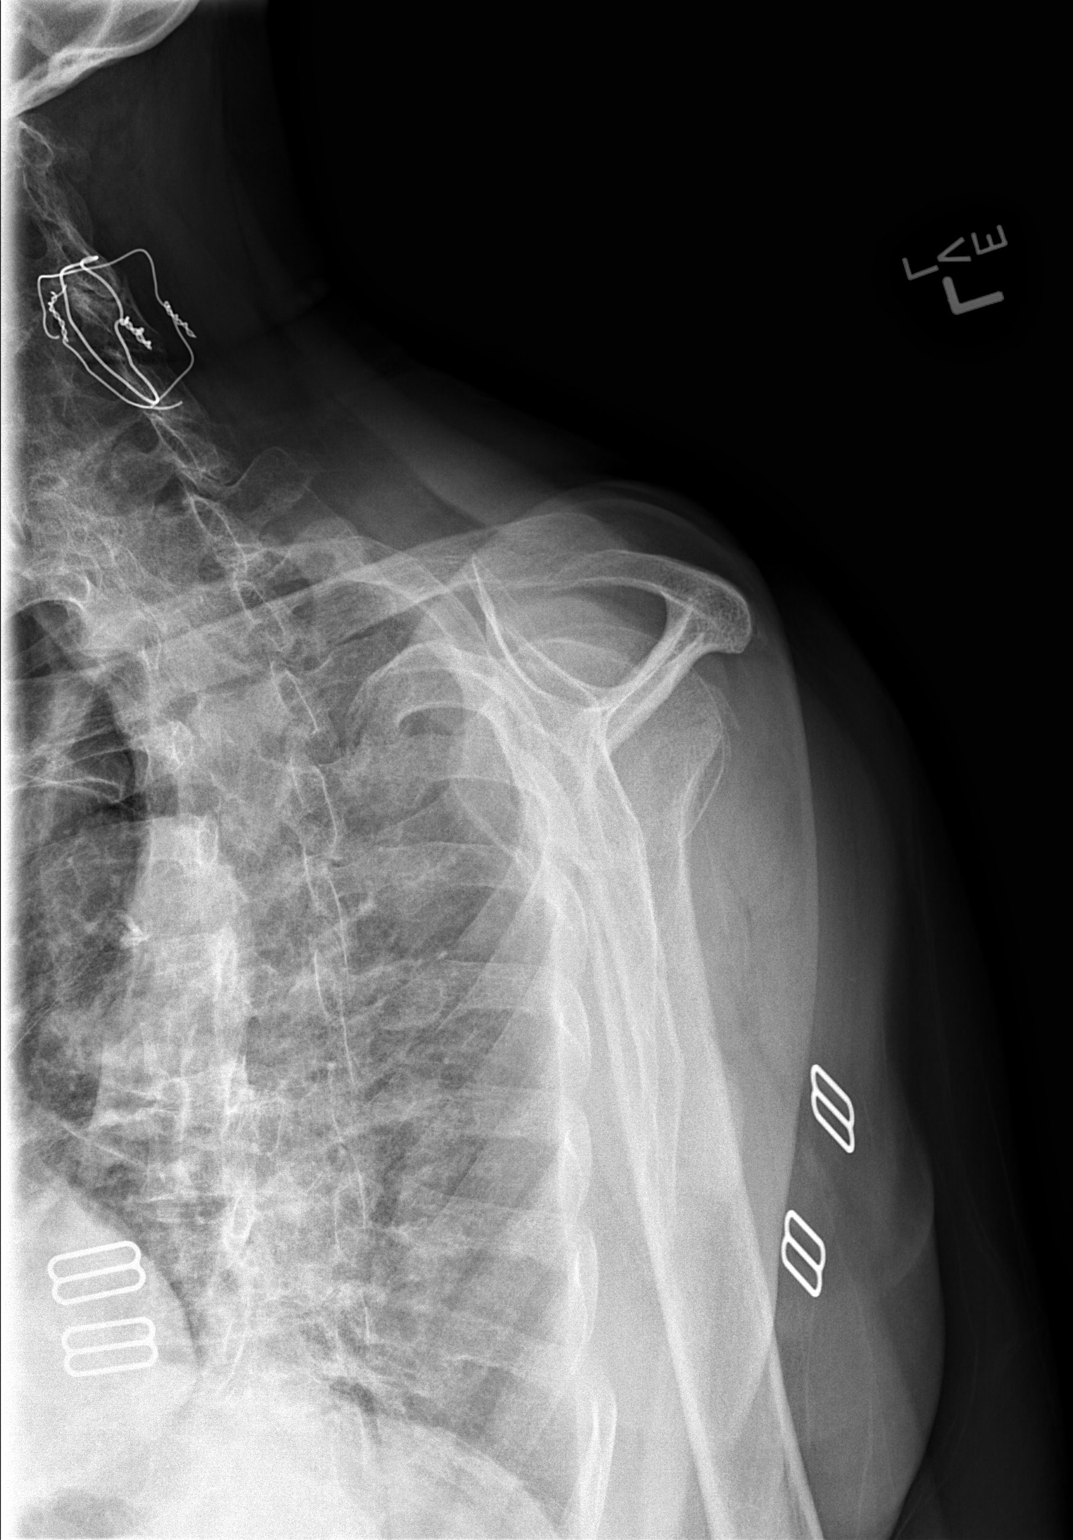

[2 of 2 positions shown; findings below may reference images not displayed]

FINDINGS: There is a left humeral neck fracture noted. Minimal displacement.
No subluxation or dislocation.
IMPRESSION: Left humeral neck fracture with minimal displacement.

## 2018-02-19 IMAGING — CR DG ELBOW COMPLETE 3+V*L*
4 series · 4 of 4 positions shown · non-contrast
Comparison: None.

CLINICAL DATA: Left elbow pain after fall.

EXAM:
LEFT ELBOW - COMPLETE 3+ VIEW

[w elbow ap left (1 of 4)]
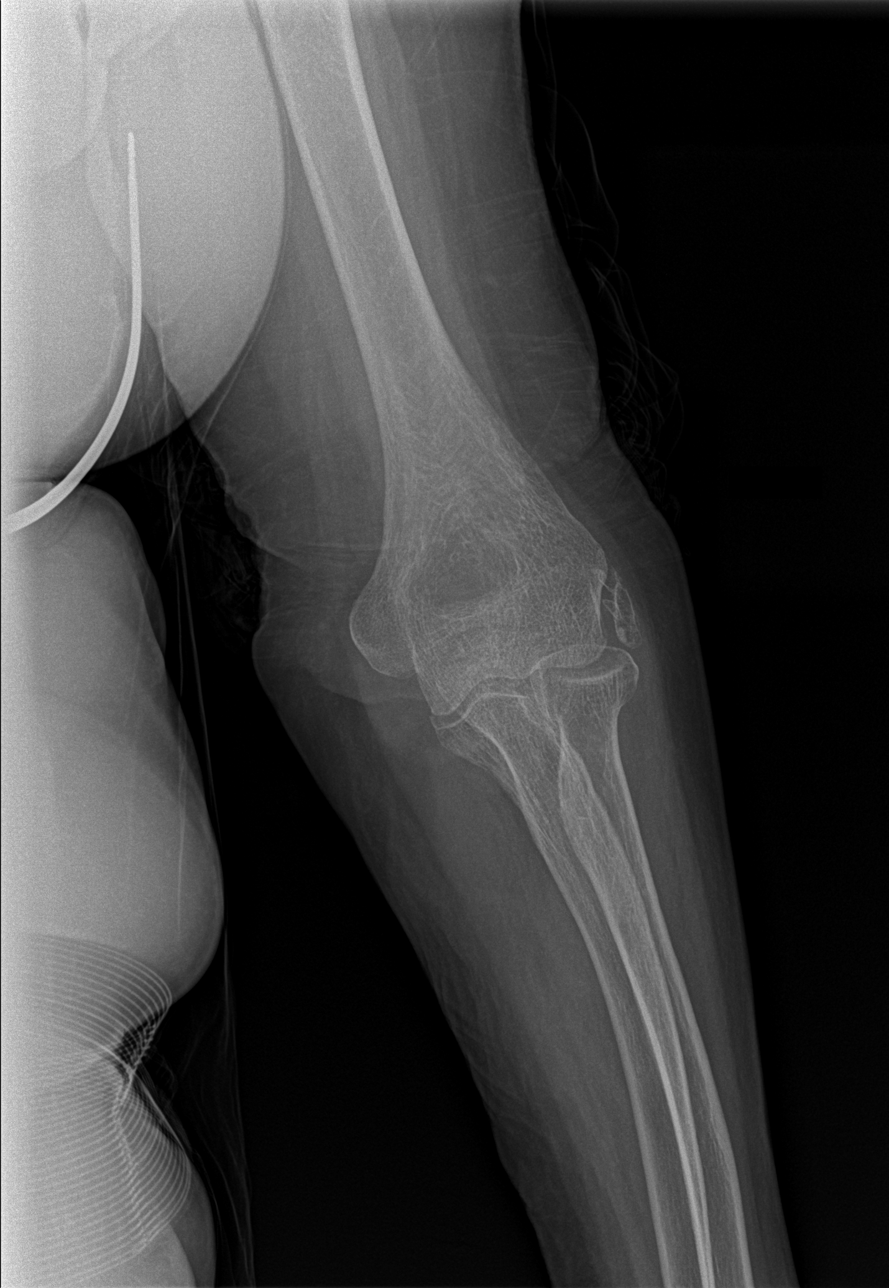

[w elbow ap left (2 of 4)]
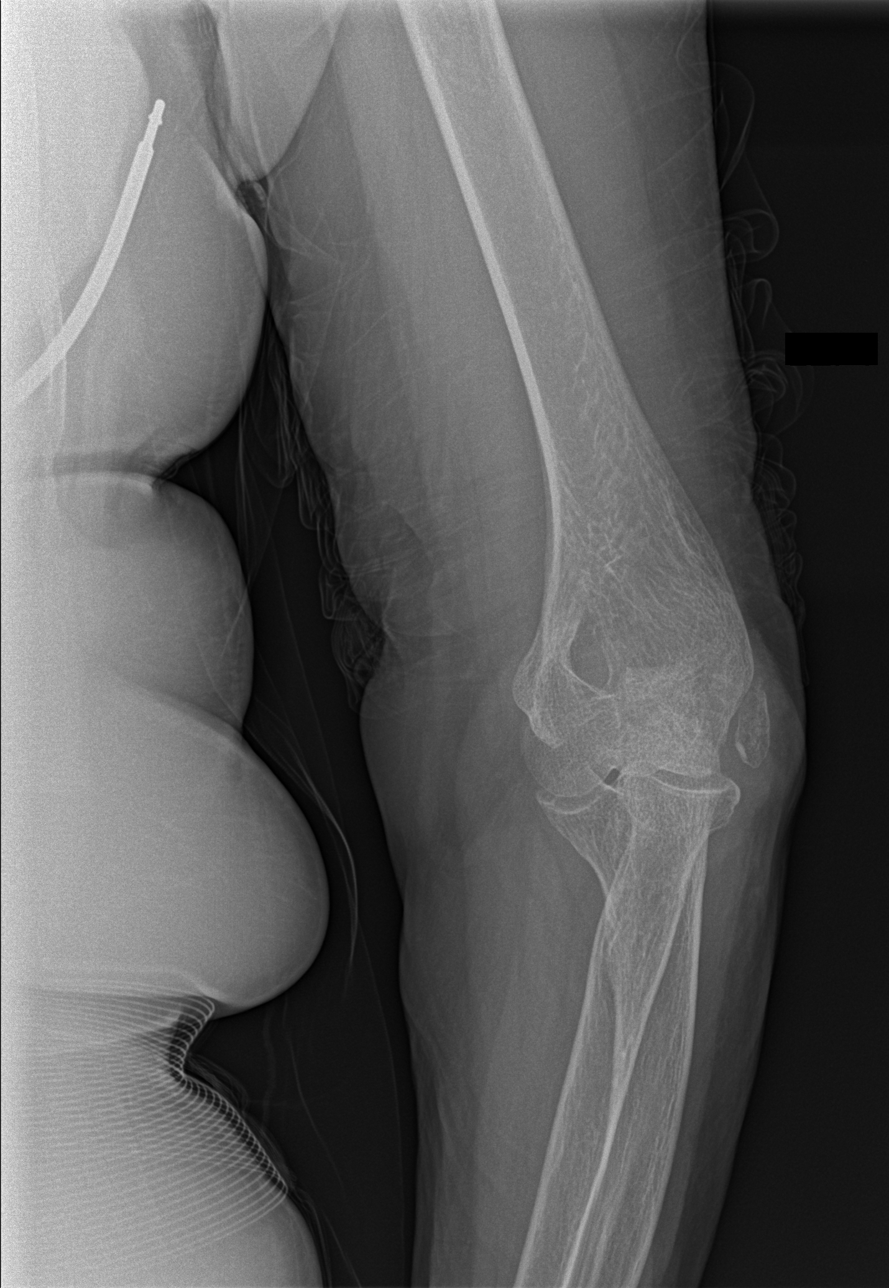

[w elbow ap left (3 of 4)]
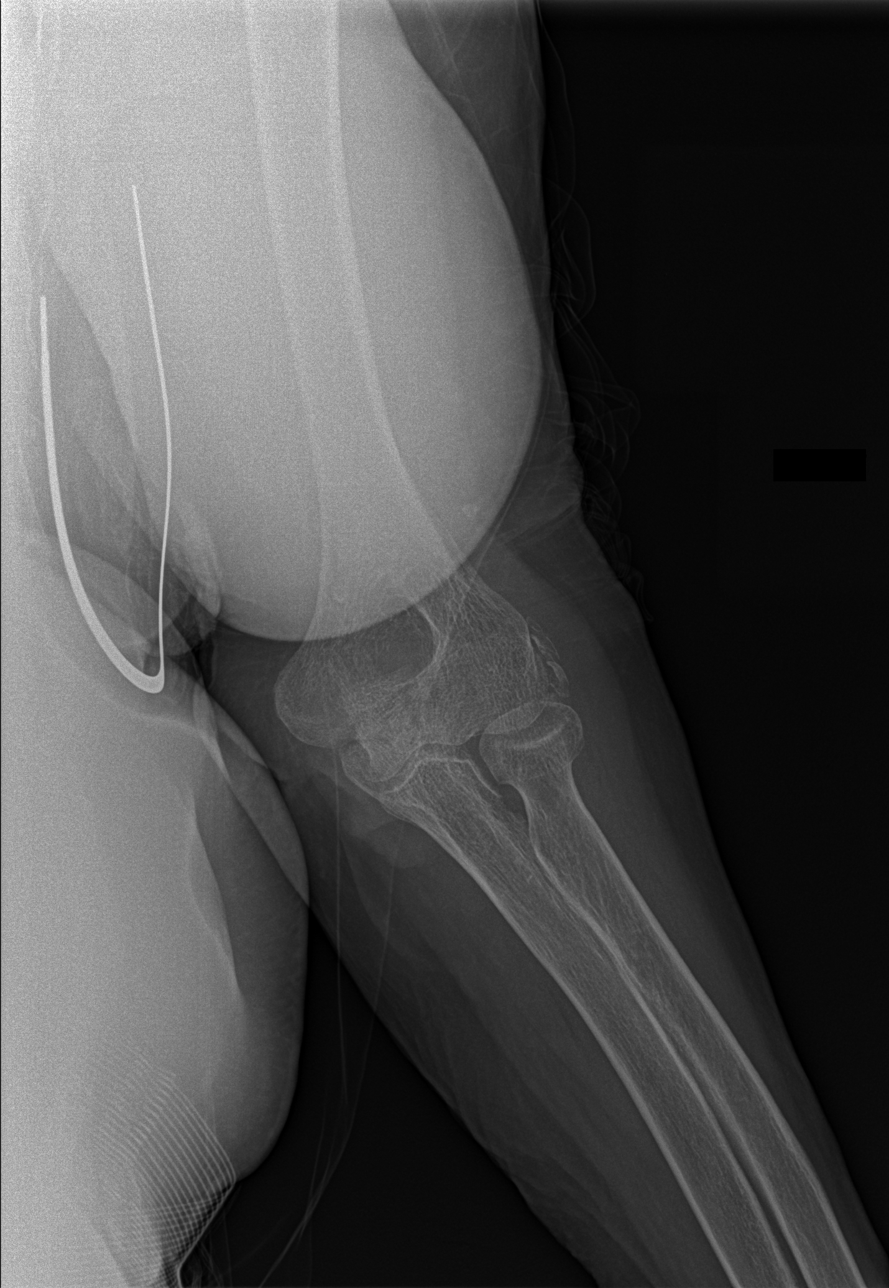

[w elbow ap left (4 of 4)]
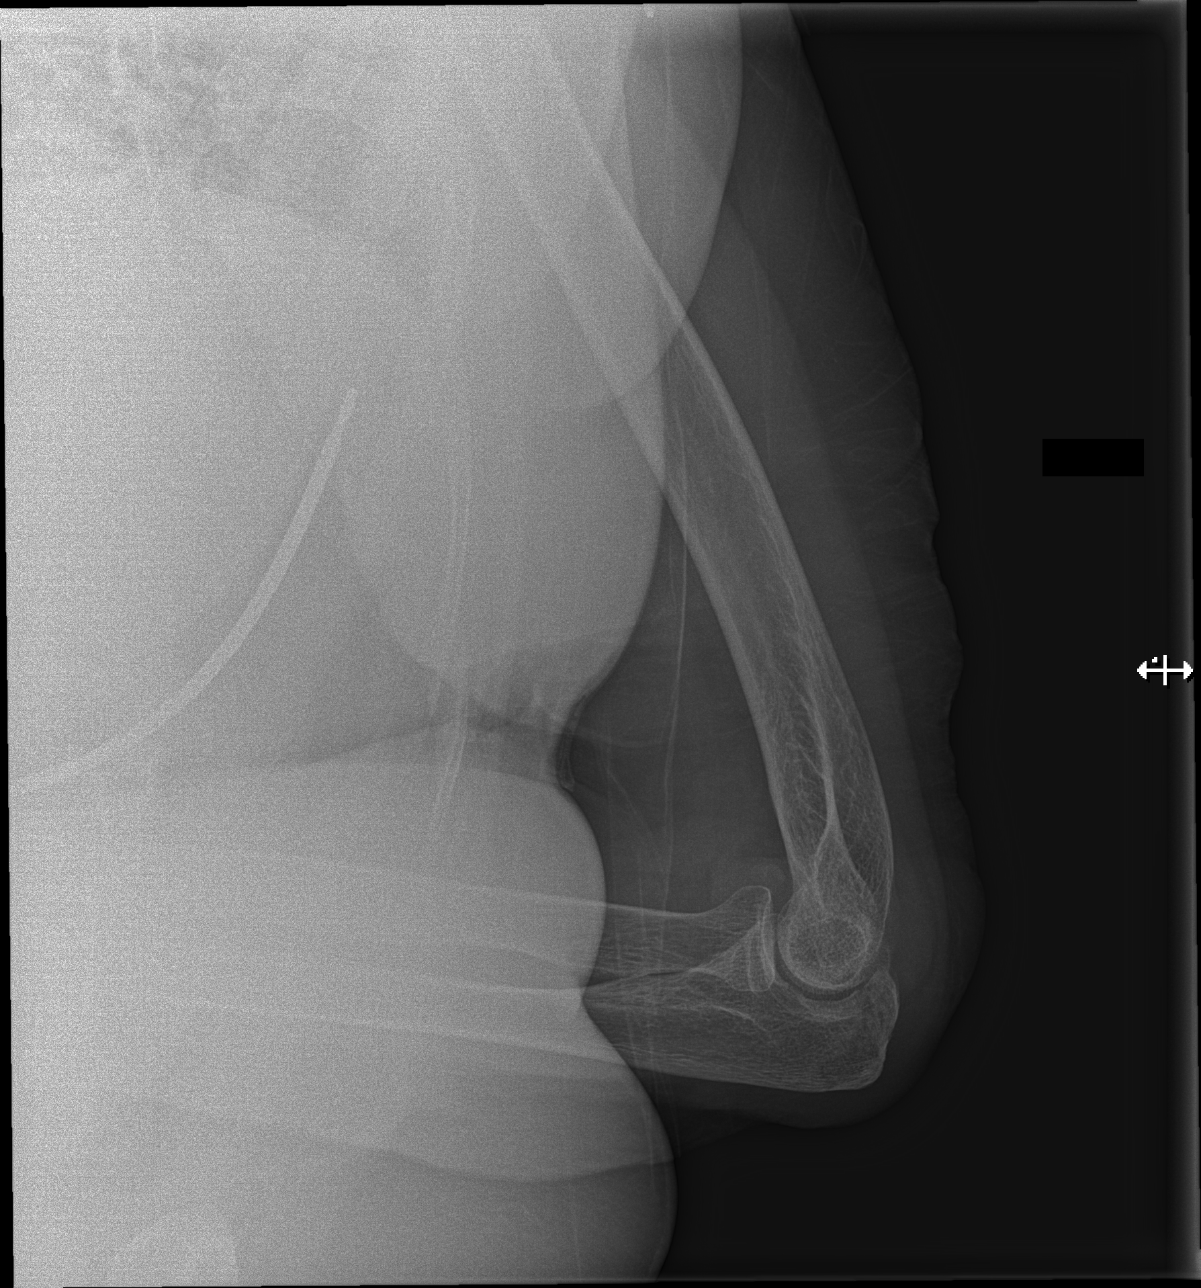

[4 of 4 positions shown; findings below may reference images not displayed]

FINDINGS: No acute fracture or dislocation. No joint effusion. Osteopenia.
Well corticated ossific densities adjacent to the lateral epicondyle
may be related to old trauma or enthesopathy. Soft tissues are
unremarkable.
IMPRESSION: 1.  No acute osseous abnormality.

## 2018-02-19 MED ORDER — HYDROCODONE-ACETAMINOPHEN 5-325 MG PO TABS
1.0000 | ORAL_TABLET | Freq: Once | ORAL | Status: DC
  Filled 2018-02-19: qty 1

## 2018-02-19 MED ORDER — HYDROCODONE-ACETAMINOPHEN 5-325 MG PO TABS: 1.0000 | ORAL_TABLET | Freq: Four times a day (QID) | ORAL | 0 refills | Status: DC | PRN

## 2018-02-19 MED ORDER — HYDROMORPHONE HCL 1 MG/ML IJ SOLN
1.0000 mg | Freq: Once | INTRAMUSCULAR | Status: AC
  Administered 2018-02-19: 1 mg via INTRAMUSCULAR
  Filled 2018-02-19: qty 1

## 2018-02-19 NOTE — Telephone Encounter (Signed)
Patient is requesting a refill on hydrocodone due to breaking her shoulder in a fall today. Patient was given a limited supply at hospital (#15) but would like a refill for more to get her through the weekend.   Current rx states to take 1 to 2 tablets by mouth every 6 hours PRN.   Last refill for patient was 01/01/18 with directions to take 1 to 2 tablets at bedtime. #60

## 2018-02-19 NOTE — ED Provider Notes (Signed)
[facility] DEPT Provider Note   CSN: [id number] Arrival date & time: 02/19/18  1036     History   Chief Complaint Chief Complaint  Patient presents with  . Shoulder Pain    HPI Sydney Reid is a 77 y.o. female.  HPI Patient has history of psoriatic arthritis.  She had a trip and fall from a standing position this morning while in the garage.  Believes that she landed on her left elbow and shoulder.  Denies hitting her head.  No loss of consciousness.  She complains only of left shoulder and left elbow pain.  She has no weakness or numbness.  Denies neck pain or headache.  Was in her normal state of health this morning.  States she took 1 hydrocodone prior to coming to the emergency department. Past Medical History:  Diagnosis Date  . H/O total knee replacement 2001   Right  . High cholesterol   . History of tonsillectomy and adenoidectomy 1946  . Psoriatic arthritis ([place]) 1990  . Sleep apnea with use of continuous positive airway pressure (CPAP) 2004  . Thyroid disease     Patient Active Problem List   Diagnosis Date Noted  . Chronic pain syndrome 07/08/2017  . 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 Aortic sclerosis 07/14/2016  . Hyperlipidemia 07/14/2016    Past Surgical History:  Procedure Laterality Date  . FOOT SURGERY     fused arch in left foot  . SPINE SURGERY    . TOTAL KNEE ARTHROPLASTY Left      OB History   None      Home Medications    Prior to Admission medications   Medication Sig Start Date End Date Taking? Authorizing Provider  etanercept (ENBREL) 50 MG/ML injection Inject 50 mg into the skin every Sunday.    Yes [provider]  levothyroxine (SYNTHROID, LEVOTHROID) 88 MCG tablet TAKE ONE TABLET BY MOUTH  ONCE DAILY 30 MINUTES  BEFORE BREAKFAST FOR  THYROID. 04/24/17  Yes Eulas Post, Monica, DO  omeprazole (PRILOSEC) 20 MG capsule Take 20 mg by mouth daily. Taking Monday,Wednesday,  Friday 1 capsule by mouth   Yes [provider]  HYDROcodone-acetaminophen (NORCO/VICODIN) 5-325 MG tablet Take 1-2 tablets by mouth every 6 (six) hours as needed for up to 7 days for moderate pain. 02/19/18 02/26/18  Gildardo Cranker, DO    Family History Family History  Problem Relation Age of Onset  . Heart attack Father   . Heart disease Sister     Social History Social History   Tobacco Use  . Smoking status: Former Smoker    Packs/day: 2.00    Years: 18.00    Pack years: 36.00    Types: Cigarettes    Last attempt to quit: 1975    Years since quitting: 44.3  . Smokeless tobacco: Never Used  Substance Use Topics  . Alcohol use: Yes    Alcohol/week: 1.2 - 4.2 oz    Types: 2 - 7 Standard drinks or equivalent per week    Comment: Social drinker, couple times a month  . Drug use: No     Allergies   Codeine and Statins   Review of Systems Review of Systems  Constitutional: Negative for chills and fever.  HENT: Negative for facial swelling.   Eyes: Negative for visual disturbance.  Respiratory: Negative for shortness of breath.   Cardiovascular: Negative for chest pain and palpitations.  Gastrointestinal: Negative for abdominal pain, nausea and vomiting.  Musculoskeletal: Positive for arthralgias. Negative for back pain, myalgias and neck pain.  Skin: Negative for rash and wound.  Neurological: Negative for dizziness, syncope, weakness, light-headedness, numbness and headaches.  All other systems reviewed and are negative.    Physical Exam Updated Vital Signs BP (!) 122/97 (BP Location: Right Arm)   Pulse 77   Temp 97.6 F (36.4 C) (Oral)   Resp 18   Ht 5' 5.5" (1.664 m)   Wt 68.9 kg (152 lb)   SpO2 98%   BMI 24.91 kg/m   Physical Exam  Constitutional: She is oriented to person, place, and time. She appears well-developed and well-nourished. No distress.  HENT:  Head: Normocephalic and atraumatic.  Mouth/Throat: Oropharynx is clear and moist.    Eyes: Pupils are equal, round, and reactive to light. EOM are normal.  Neck: Normal range of motion. Neck supple.  No posterior midline cervical tenderness to palpation.  Cardiovascular: Normal rate and regular rhythm. Exam reveals no gallop and no friction rub.  No murmur heard. Pulmonary/Chest: Effort normal and breath sounds normal. No stridor. No respiratory distress. She has no wheezes. She has no rales. She exhibits no tenderness.  Abdominal: Soft. Bowel sounds are normal. There is no tenderness. There is no rebound and no guarding.  Musculoskeletal: Normal range of motion. She exhibits tenderness. She exhibits no edema.  Patient has tenderness to palpation over the lateral left shoulder and posterior left shoulder.  Limited range of motion due to pain.  No obvious deformity.  Patient has mild pain with range of motion of the left elbow and small abrasion over the olecranon process.  No obvious swelling or deformity.  Distal pulses are 2+.  No midline thoracic or lumbar tenderness.  Patient has chronic deformities of bilateral hands due to psoriatic arthritis.   Neurological: She is alert and oriented to person, place, and time.  5/5 motor in all extremities.  Sensation fully intact.  Skin: Skin is warm and dry. Capillary refill takes less than 2 seconds. No rash noted. She is not diaphoretic. No erythema.  Psychiatric: She has a normal mood and affect. Her behavior is normal.  Nursing note and vitals reviewed.    ED Treatments / Results  Labs (all labs ordered are listed, but only abnormal results are displayed) Labs Reviewed - No data to display  EKG None  Radiology No results found.  Procedures Procedures (including critical care time)  Medications Ordered in ED Medications  HYDROmorphone (DILAUDID) injection 1 mg (1 mg Intramuscular Given 02/19/18 1158)     Initial Impression / Assessment and Plan / ED Course  I have reviewed the triage vital signs and the nursing  notes.  Pertinent labs & imaging results that were available during my care of the patient were reviewed by me and considered in my medical decision making (see chart for details).     Proximal humerus fracture on x-ray.  Patient placed in shoulder immobilizer.  Will follow up with orthopedics.  Return precautions given.  Final Clinical Impressions(s) / ED Diagnoses   Final diagnoses:  Closed fracture of proximal end of left humerus, unspecified fracture morphology, initial encounter    ED Discharge Orders        Ordered    HYDROcodone-acetaminophen (NORCO/VICODIN) 5-325 MG tablet  Every 6 hours PRN,   Status:  Discontinued     02/19/18 1237       Julianne Rice, MD 02/26/18 1658

## 2018-02-19 NOTE — ED Triage Notes (Signed)
Pt states she tripped and fell in the garage this morning. Pt complains of left shoulder pain and limited ROM. Pt denies loss of consciousness or head injury. Pt is not on blood thinners. Pt states she took a Vicodin 15 minutes prior to arrival, states it has not kicked in yet.

## 2018-02-25 DIAGNOSIS — S42202A Unspecified fracture of upper end of left humerus, initial encounter for closed fracture: Secondary | ICD-10-CM | POA: Diagnosis not present

## 2018-03-10 DIAGNOSIS — S42202D Unspecified fracture of upper end of left humerus, subsequent encounter for fracture with routine healing: Secondary | ICD-10-CM | POA: Diagnosis not present

## 2018-03-12 ENCOUNTER — Telehealth: Payer: Self-pay | Admitting: Pulmonary Disease

## 2018-03-12 DIAGNOSIS — G4733 Obstructive sleep apnea (adult) (pediatric): Secondary | ICD-10-CM

## 2018-03-12 NOTE — Telephone Encounter (Signed)
Called and spoke with patient, she states that her chin strap didn't work for her due to her arthritis. She states that she would like to have an order for the bella loop so that it will hold better.    VS please advise, thank you.

## 2018-03-12 NOTE — Telephone Encounter (Signed)
Called and spoke with patient, advised that we are sending order. Nothing further needed. Order placed.

## 2018-03-12 NOTE — Telephone Encounter (Signed)
Okay to send order. 

## 2018-03-30 DIAGNOSIS — S42202D Unspecified fracture of upper end of left humerus, subsequent encounter for fracture with routine healing: Secondary | ICD-10-CM | POA: Diagnosis not present

## 2018-04-26 ENCOUNTER — Telehealth: Payer: Self-pay | Admitting: *Deleted

## 2018-04-26 DIAGNOSIS — G894 Chronic pain syndrome: Secondary | ICD-10-CM

## 2018-04-26 DIAGNOSIS — L4052 Psoriatic arthritis mutilans: Secondary | ICD-10-CM

## 2018-04-26 MED ORDER — HYDROCODONE-ACETAMINOPHEN 5-325 MG PO TABS: 1.0000 | ORAL_TABLET | Freq: Every day | ORAL | 0 refills | Status: DC

## 2018-04-26 NOTE — Telephone Encounter (Signed)
done

## 2018-04-26 NOTE — Telephone Encounter (Signed)
Patient requested RF of Hydrocodone 5/325. Not on current med list. [facility] shows last filled on 02/19/2018 by Dr. Eulas Post at [facility]. #[phone].

## 2018-04-26 NOTE — Telephone Encounter (Signed)
Patient aware rx sent electronically  

## 2018-04-28 DIAGNOSIS — S42202D Unspecified fracture of upper end of left humerus, subsequent encounter for fracture with routine healing: Secondary | ICD-10-CM | POA: Diagnosis not present

## 2018-04-30 ENCOUNTER — Ambulatory Visit (INDEPENDENT_AMBULATORY_CARE_PROVIDER_SITE_OTHER): Payer: Medicare Other

## 2018-04-30 ENCOUNTER — Other Ambulatory Visit: Payer: Medicare Other

## 2018-04-30 VITALS — BP 132/70 | HR 76 | Temp 97.9°F | Ht 66.0 in | Wt 156.0 lb

## 2018-04-30 DIAGNOSIS — Z0000 Encounter for general adult medical examination without abnormal findings: Secondary | ICD-10-CM

## 2018-04-30 MED ORDER — ZOSTER VAC RECOMB ADJUVANTED 50 MCG/0.5ML IM SUSR: 0.5000 mL | Freq: Once | INTRAMUSCULAR | 1 refills | Status: AC

## 2018-04-30 NOTE — Progress Notes (Addendum)
Subjective:   Sydney Reid is a 77 y.o. female who presents for Medicare Annual (Subsequent) preventive examination.  Last AWV-04/09/2017    Objective:     Vitals: BP 132/70 (BP Location: Left Arm, Patient Position: Sitting)   Pulse 76   Temp 97.9 F (36.6 C) (Oral)   Ht '5\' 6"'$  (1.676 m)   Wt 156 lb (70.8 kg)   SpO2 98%   BMI 25.18 kg/m   Body mass index is 25.18 kg/m.  Advanced Directives 04/30/2018 04/24/2017 04/09/2017 06/02/2016 05/02/2016  Does Patient Have a Medical Advance Directive? Yes Yes Yes Yes Yes  Type of Paramedic of [place];Living will [place];Living will [place];Living will Living will [place]  Does patient want to make changes to medical advance directive? No - Patient declined No - Patient declined No - Patient declined No - Patient declined No - Patient declined  Copy of [place] in Chart? No - copy requested No - copy requested No - copy requested No - copy requested No - copy requested    Tobacco Social History   Tobacco Use  Smoking Status Former Smoker  . Packs/day: 2.00  . Years: 18.00  . Pack years: 36.00  . Types: Cigarettes  . Last attempt to quit: 1975  . Years since quitting: 44.5  Smokeless Tobacco Never Used     Counseling given: Not Answered   Clinical Intake:  Pre-visit preparation completed: No  Pain : No/denies pain     Nutritional Risks: None Diabetes: No  How often do you need to have someone help you when you read instructions, pamphlets, or other written materials from your doctor or pharmacy?: 1 - Never What is the last grade level you completed in school?: Post graduate  Interpreter Needed?: No  Information entered by :: Theodoro Doing, RN  Past Medical History:  Diagnosis Date  . H/O total knee replacement 2001   Right  . High cholesterol   . History of tonsillectomy and adenoidectomy 1946  . Psoriatic  arthritis ([place]) 1990  . Sleep apnea with use of continuous positive airway pressure (CPAP) 2004  . Thyroid disease    Past Surgical History:  Procedure Laterality Date  . FOOT SURGERY     fused arch in left foot  . SPINE SURGERY    . TOTAL KNEE ARTHROPLASTY Left    Family History  Problem Relation Age of Onset  . Heart attack Father   . Heart disease Sister    Social History   Socioeconomic History  . Marital status: Married    Spouse name: Not on file  . Number of children: Not on file  . Years of education: Not on file  . Highest education level: Not on file  Occupational History  . Not on file  Social Needs  . Financial resource strain: Not hard at all  . Food insecurity:    Worry: Never true    Inability: Never true  . Transportation needs:    Medical: No    Non-medical: No  Tobacco Use  . Smoking status: Former Smoker    Packs/day: 2.00    Years: 18.00    Pack years: 36.00    Types: Cigarettes    Last attempt to quit: 1975    Years since quitting: 44.5  . Smokeless tobacco: Never Used  Substance and Sexual Activity  . Alcohol use: Yes    Alcohol/week: 1.2 - 4.2 oz  Types: 2 - 7 Standard drinks or equivalent per week    Comment: Social drinker, couple times a month  . Drug use: No  . Sexual activity: Not on file  Lifestyle  . Physical activity:    Days per week: 0 days    Minutes per session: 0 min  . Stress: Not at all  Relationships  . Social connections:    Talks on phone: More than three times a week    Gets together: Three times a week    Attends religious service: More than 4 times per year    Active member of club or organization: Yes    Attends meetings of clubs or organizations: More than 4 times per year    Relationship status: Married  Other Topics Concern  . Not on file  Social History Narrative   Diet? Normal      Do you drink/eat things with caffeine? Yes, 1 mt. Dew per day      Marital status?              m                       What year were you married? 1988      Do you live in a house, apartment, assisted living, condo, trailer, etc.? Townhouse      Is it one or more stories? 2 story      How many persons live in your home? 2      Do you have any pets in your home? (please list) no      Current or past profession: Is manager      Do you exercise?     No (did it for 50 years)                       Type & how often?      Do you have a living will? yes      Do you have a DNR form?        yes                          If not, do you want to discuss one?      Do you have signed POA/HPOA for forms?        Outpatient Encounter Medications as of 04/30/2018  Medication Sig  . etanercept (ENBREL) 50 MG/ML injection Inject 50 mg into the skin every Sunday.   Marland Kitchen HYDROcodone-acetaminophen (NORCO) 5-325 MG tablet Take 1-2 tablets by mouth at bedtime.  Marland Kitchen levothyroxine (SYNTHROID, LEVOTHROID) 88 MCG tablet TAKE ONE TABLET BY MOUTH  ONCE DAILY 30 MINUTES  BEFORE BREAKFAST FOR  THYROID.  Marland Kitchen Zoster Vaccine Adjuvanted [facility]) injection Inject 0.5 mLs into the muscle once for 1 dose.  . [DISCONTINUED] Zoster Vaccine Adjuvanted [facility]) injection Inject 0.5 mLs into the muscle once.  Marland Kitchen omeprazole (PRILOSEC) 20 MG capsule Take 20 mg by mouth daily. Taking Monday,Wednesday, Friday 1 capsule by mouth   No facility-administered encounter medications on file as of 04/30/2018.     Activities of Daily Living In your present state of health, do you have any difficulty performing the following activities: 04/30/2018  Hearing? N  Vision? N  Difficulty concentrating or making decisions? N  Walking or climbing stairs? N  Dressing or bathing? N  Doing errands, shopping? N  Preparing Food and eating ? N  Using the  Toilet? N  In the past six months, have you accidently leaked urine? N  Do you have problems with loss of bowel control? N  Managing your Medications? N  Managing your Finances? N  Housekeeping or managing your  Housekeeping? N  Some recent data might be hidden    Patient Care Team: Gildardo Cranker, DO as PCP - General (Internal Medicine) Tomma Rakers, MD as Referring Physician (Ophthalmology)    Assessment:   This is a routine wellness examination for Kashawna.  Exercise Activities and Dietary recommendations Current Exercise Habits: The patient does not participate in regular exercise at present, Exercise limited by: None identified  Goals    . Pilaties and tai chi     Patient will start doing tai chi and pilates.       Fall Risk Fall Risk  04/30/2018 12/25/2017 07/08/2017 04/24/2017 04/09/2017  Falls in the past year? Yes No No No No  Number falls in past yr: 1 - - - -  Injury with Fall? Yes - - - -  Comment L shoulder - - - -   Is the patient's home free of loose throw rugs in walkways, pet beds, electrical cords, etc?   yes      Grab bars in the bathroom? yes      Handrails on the stairs?   yes      Adequate lighting?   yes  Timed Get Up and Go performed: 13 seconds  Depression Screen PHQ 2/9 Scores 04/30/2018 12/25/2017 04/24/2017 04/09/2017  PHQ - 2 Score 0 0 0 0     Cognitive Function MMSE - Mini Mental State Exam 04/30/2018 04/09/2017  Orientation to time 5 5  Orientation to Place 5 5  Registration 3 3  Attention/ Calculation 5 5  Recall 3 3  Language- name 2 objects 2 2  Language- repeat 1 1  Language- follow 3 step command 3 3  Language- read & follow direction 1 1  Write a sentence 1 1  Copy design 1 1  Total score 30 30        Immunization History  Administered Date(s) Administered  . Influenza,inj,Quad PF,6+ Mos 07/08/2017  . Influenza-Unspecified 09/13/2012, 11/09/2014  . Pneumococcal Conjugate-13 11/23/2014  . Pneumococcal Polysaccharide-23 10/27/2005  . Tdap 10/27/2009    Qualifies for Shingles Vaccine? Yes, educated and ordered to pharmacy  Screening Tests Health Maintenance  Topic Date Due  . INFLUENZA VACCINE  05/27/2018  . TETANUS/TDAP   10/28/2019  . DEXA SCAN  Completed  . PNA vac Low Risk Adult  Completed    Cancer Screenings: Lung: Low Dose CT Chest recommended if Age 93-80 years, 30 pack-year currently smoking OR have quit w/in 15years. Patient does not qualify. Breast:  Up to date on Mammogram? Yes   Up to date of Bone Density/Dexa? Yes Colorectal: up to date  Additional Screenings:  Hepatitis C Screening: declined     Plan:    I have personally reviewed and addressed the Medicare Annual Wellness questionnaire and have noted the following in the patient's chart:  A. Medical and social history B. Use of alcohol, tobacco or illicit drugs  C. Current medications and supplements D. Functional ability and status E.  Nutritional status F.  Physical activity G. Advance directives H. List of other physicians I.  Hospitalizations, surgeries, and ER visits in previous 12 months J.  [place] to include hearing, vision, cognitive, depression L. Referrals and appointments - none  In addition, I have reviewed  and discussed with patient certain preventive protocols, quality metrics, and best practice recommendations. A written personalized care plan for preventive services as well as general preventive health recommendations were provided to patient.  See attached scanned questionnaire for additional information.   Signed,   Tyson Dense, RN Nurse Health Advisor  Patient Concerns: None

## 2018-04-30 NOTE — Patient Instructions (Addendum)
Sydney Reid , Thank you for taking time to come for your Medicare Wellness Visit. I appreciate your ongoing commitment to your health goals. Please review the following plan we discussed and let me know if I can assist you in the future.   Screening recommendations/referrals: Colonoscopy excluded, over age 78 Mammogram up to date Bone Density up to date Recommended yearly ophthalmology/optometry visit for glaucoma screening and checkup Recommended yearly dental visit for hygiene and checkup  Vaccinations: Influenza vaccine up to date, due 2019 fall season Pneumococcal vaccine up to date, completed Tdap vaccine up to date, due 10/28/2019 Shingles vaccine due, ordered to pharmacy    Advanced directives: Need a copy of living will and HCPOA for chart  Conditions/risks identified: none  Next appointment: Dr. Eulas Post 05/11/2018 @ 2:45pm             Tyson Dense, RN 05/02/2019 @ 11am   Preventive Care 65 Years and Older, Female Preventive care refers to lifestyle choices and visits with your health care provider that can promote health and wellness. What does preventive care include?  A yearly physical exam. This is also called an annual well check.  Dental exams once or twice a year.  Routine eye exams. Ask your health care provider how often you should have your eyes checked.  Personal lifestyle choices, including:  Daily care of your teeth and gums.  Regular physical activity.  Eating a healthy diet.  Avoiding tobacco and drug use.  Limiting alcohol use.  Practicing safe sex.  Taking low-dose aspirin every day.  Taking vitamin and mineral supplements as recommended by your health care provider. What happens during an annual well check? The services and screenings done by your health care provider during your annual well check will depend on your age, overall health, lifestyle risk factors, and family history of disease. Counseling  Your health care provider may ask you  questions about your:  Alcohol use.  Tobacco use.  Drug use.  Emotional well-being.  Home and relationship well-being.  Sexual activity.  Eating habits.  History of falls.  Memory and ability to understand (cognition).  Work and work Statistician.  Reproductive health. Screening  You may have the following tests or measurements:  Height, weight, and BMI.  Blood pressure.  Lipid and cholesterol levels. These may be checked every 5 years, or more frequently if you are over 23 years old.  Skin check.  Lung cancer screening. You may have this screening every year starting at age 46 if you have a 30-pack-year history of smoking and currently smoke or have quit within the past 15 years.  Fecal occult blood test (FOBT) of the stool. You may have this test every year starting at age 32.  Flexible sigmoidoscopy or colonoscopy. You may have a sigmoidoscopy every 5 years or a colonoscopy every 10 years starting at age 29.  Hepatitis C blood test.  Hepatitis B blood test.  Sexually transmitted disease (STD) testing.  Diabetes screening. This is done by checking your blood sugar (glucose) after you have not eaten for a while (fasting). You may have this done every 1-3 years.  Bone density scan. This is done to screen for osteoporosis. You may have this done starting at age 54.  Mammogram. This may be done every 1-2 years. Talk to your health care provider about how often you should have regular mammograms. Talk with your health care provider about your test results, treatment options, and if necessary, the need for more tests. Vaccines  Your health care provider may recommend certain vaccines, such as:  Influenza vaccine. This is recommended every year.  Tetanus, diphtheria, and acellular pertussis (Tdap, Td) vaccine. You may need a Td booster every 10 years.  Zoster vaccine. You may need this after age 48.  Pneumococcal 13-valent conjugate (PCV13) vaccine. One dose is  recommended after age 29.  Pneumococcal polysaccharide (PPSV23) vaccine. One dose is recommended after age 44. Talk to your health care provider about which screenings and vaccines you need and how often you need them. This information is not intended to replace advice given to you by your health care provider. Make sure you discuss any questions you have with your health care provider. Document Released: 11/09/2015 Document Revised: 07/02/2016 Document Reviewed: 08/14/2015 Elsevier Interactive Patient Education  2017 [place] Prevention in the Home Falls can cause injuries. They can happen to people of all ages. There are many things you can do to make your home safe and to help prevent falls. What can I do on the outside of my home?  Regularly fix the edges of walkways and driveways and fix any cracks.  Remove anything that might make you trip as you walk through a door, such as a raised step or threshold.  Trim any bushes or trees on the path to your home.  Use bright outdoor lighting.  Clear any walking paths of anything that might make someone trip, such as rocks or tools.  Regularly check to see if handrails are loose or broken. Make sure that both sides of any steps have handrails.  Any raised decks and porches should have guardrails on the edges.  Have any leaves, snow, or ice cleared regularly.  Use sand or salt on walking paths during winter.  Clean up any spills in your garage right away. This includes oil or grease spills. What can I do in the bathroom?  Use night lights.  Install grab bars by the toilet and in the tub and shower. Do not use towel bars as grab bars.  Use non-skid mats or decals in the tub or shower.  If you need to sit down in the shower, use a plastic, non-slip stool.  Keep the floor dry. Clean up any water that spills on the floor as soon as it happens.  Remove soap buildup in the tub or shower regularly.  Attach bath mats  securely with double-sided non-slip rug tape.  Do not have throw rugs and other things on the floor that can make you trip. What can I do in the bedroom?  Use night lights.  Make sure that you have a light by your bed that is easy to reach.  Do not use any sheets or blankets that are too big for your bed. They should not hang down onto the floor.  Have a firm chair that has side arms. You can use this for support while you get dressed.  Do not have throw rugs and other things on the floor that can make you trip. What can I do in the kitchen?  Clean up any spills right away.  Avoid walking on wet floors.  Keep items that you use a lot in easy-to-reach places.  If you need to reach something above you, use a strong step stool that has a grab bar.  Keep electrical cords out of the way.  Do not use floor polish or wax that makes floors slippery. If you must use wax, use non-skid floor wax.  Do  not have throw rugs and other things on the floor that can make you trip. What can I do with my stairs?  Do not leave any items on the stairs.  Make sure that there are handrails on both sides of the stairs and use them. Fix handrails that are broken or loose. Make sure that handrails are as long as the stairways.  Check any carpeting to make sure that it is firmly attached to the stairs. Fix any carpet that is loose or worn.  Avoid having throw rugs at the top or bottom of the stairs. If you do have throw rugs, attach them to the floor with carpet tape.  Make sure that you have a light switch at the top of the stairs and the bottom of the stairs. If you do not have them, ask someone to add them for you. What else can I do to help prevent falls?  Wear shoes that:  Do not have high heels.  Have rubber bottoms.  Are comfortable and fit you well.  Are closed at the toe. Do not wear sandals.  If you use a stepladder:  Make sure that it is fully opened. Do not climb a closed  stepladder.  Make sure that both sides of the stepladder are locked into place.  Ask someone to hold it for you, if possible.  Clearly mark and make sure that you can see:  Any grab bars or handrails.  First and last steps.  Where the edge of each step is.  Use tools that help you move around (mobility aids) if they are needed. These include:  Canes.  Walkers.  Scooters.  Crutches.  Turn on the lights when you go into a dark area. Replace any light bulbs as soon as they burn out.  Set up your furniture so you have a clear path. Avoid moving your furniture around.  If any of your floors are uneven, fix them.  If there are any pets around you, be aware of where they are.  Review your medicines with your doctor. Some medicines can make you feel dizzy. This can increase your chance of falling. Ask your doctor what other things that you can do to help prevent falls. This information is not intended to replace advice given to you by your health care provider. Make sure you discuss any questions you have with your health care provider. Document Released: 08/09/2009 Document Revised: 03/20/2016 Document Reviewed: 11/17/2014 Elsevier Interactive Patient Education  2017 [employer].

## 2018-05-03 ENCOUNTER — Other Ambulatory Visit: Payer: Self-pay | Admitting: Internal Medicine

## 2018-05-03 ENCOUNTER — Other Ambulatory Visit: Payer: Medicare Other

## 2018-05-03 DIAGNOSIS — E782 Mixed hyperlipidemia: Secondary | ICD-10-CM

## 2018-05-03 DIAGNOSIS — Z79899 Other long term (current) drug therapy: Secondary | ICD-10-CM | POA: Diagnosis not present

## 2018-05-03 DIAGNOSIS — E034 Atrophy of thyroid (acquired): Secondary | ICD-10-CM | POA: Diagnosis not present

## 2018-05-04 LAB — CBC WITH DIFFERENTIAL/PLATELET
Basophils Absolute: 12 cells/uL (ref 0–200)
Basophils Relative: 0.2 %
Eosinophils Absolute: 0 cells/uL — ABNORMAL LOW (ref 15–500)
Eosinophils Relative: 0 %
HCT: 47.7 % — ABNORMAL HIGH (ref 35.0–45.0)
Hemoglobin: 15.1 g/dL (ref 11.7–15.5)
Lymphs Abs: 1978 cells/uL (ref 850–3900)
MCH: 25.7 pg — ABNORMAL LOW (ref 27.0–33.0)
MCHC: 31.7 g/dL — ABNORMAL LOW (ref 32.0–36.0)
MCV: 81.1 fL (ref 80.0–100.0)
MPV: 11.2 fL (ref 7.5–12.5)
Monocytes Relative: 9.5 %
Neutro Abs: 3621 cells/uL (ref 1500–7800)
Neutrophils Relative %: 58.4 %
Platelets: 249 10*3/uL (ref 140–400)
RBC: 5.88 10*6/uL — ABNORMAL HIGH (ref 3.80–5.10)
RDW: 13.6 % (ref 11.0–15.0)
Total Lymphocyte: 31.9 %
WBC mixed population: 589 cells/uL (ref 200–950)
WBC: 6.2 10*3/uL (ref 3.8–10.8)

## 2018-05-04 LAB — URINALYSIS, ROUTINE W REFLEX MICROSCOPIC
Bacteria, UA: NONE SEEN /HPF
Bilirubin Urine: NEGATIVE
Glucose, UA: NEGATIVE
Hgb urine dipstick: NEGATIVE
Hyaline Cast: NONE SEEN /LPF
Ketones, ur: NEGATIVE
Nitrite: NEGATIVE
Protein, ur: NEGATIVE
RBC / HPF: NONE SEEN /HPF (ref 0–2)
Specific Gravity, Urine: 1.016 (ref 1.001–1.03)
pH: <=5 (ref 5.0–8.0)

## 2018-05-04 LAB — COMPLETE METABOLIC PANEL WITH GFR
AG Ratio: 1.4 (calc) (ref 1.0–2.5)
ALT: 15 U/L (ref 6–29)
AST: 17 U/L (ref 10–35)
Albumin: 4.4 g/dL (ref 3.6–5.1)
Alkaline phosphatase (APISO): 78 U/L (ref 33–130)
BUN/Creatinine Ratio: 15 (calc) (ref 6–22)
BUN: 16 mg/dL (ref 7–25)
CO2: 23 mmol/L (ref 20–32)
Calcium: 9.6 mg/dL (ref 8.6–10.4)
Chloride: 110 mmol/L (ref 98–110)
Creat: 1.06 mg/dL — ABNORMAL HIGH (ref 0.60–0.93)
GFR, Est African American: 58 mL/min/{1.73_m2} — ABNORMAL LOW (ref >=60)
GFR, Est Non African American: 50 mL/min/{1.73_m2} — ABNORMAL LOW (ref >=60)
Globulin: 3.1 g/dL (calc) (ref 1.9–3.7)
Glucose, Bld: 99 mg/dL (ref 65–99)
Potassium: 4.5 mmol/L (ref 3.5–5.3)
Sodium: 142 mmol/L (ref 135–146)
Total Bilirubin: 0.5 mg/dL (ref 0.2–1.2)
Total Protein: 7.5 g/dL (ref 6.1–8.1)

## 2018-05-04 LAB — LIPID PANEL
Cholesterol: 216 mg/dL — ABNORMAL HIGH (ref <200)
HDL: 48 mg/dL — ABNORMAL LOW (ref >50)
LDL Cholesterol (Calc): 143 mg/dL (calc) — ABNORMAL HIGH
Non-HDL Cholesterol (Calc): 168 mg/dL (calc) — ABNORMAL HIGH (ref <130)
Total CHOL/HDL Ratio: 4.5 (calc) (ref <5.0)
Triglycerides: 125 mg/dL (ref <150)

## 2018-05-04 LAB — TSH: TSH: 0.87 mIU/L (ref 0.40–4.50)

## 2018-05-10 DIAGNOSIS — L4050 Arthropathic psoriasis, unspecified: Secondary | ICD-10-CM | POA: Diagnosis not present

## 2018-05-10 DIAGNOSIS — M1990 Unspecified osteoarthritis, unspecified site: Secondary | ICD-10-CM | POA: Diagnosis not present

## 2018-05-10 DIAGNOSIS — M8580 Other specified disorders of bone density and structure, unspecified site: Secondary | ICD-10-CM | POA: Diagnosis not present

## 2018-05-10 DIAGNOSIS — M2550 Pain in unspecified joint: Secondary | ICD-10-CM | POA: Diagnosis not present

## 2018-05-10 DIAGNOSIS — Z79899 Other long term (current) drug therapy: Secondary | ICD-10-CM | POA: Diagnosis not present

## 2018-05-10 DIAGNOSIS — L409 Psoriasis, unspecified: Secondary | ICD-10-CM | POA: Diagnosis not present

## 2018-05-10 DIAGNOSIS — M545 Low back pain: Secondary | ICD-10-CM | POA: Diagnosis not present

## 2018-05-11 ENCOUNTER — Encounter: Payer: Self-pay | Admitting: Internal Medicine

## 2018-05-11 ENCOUNTER — Ambulatory Visit (INDEPENDENT_AMBULATORY_CARE_PROVIDER_SITE_OTHER): Payer: Medicare Other | Admitting: Internal Medicine

## 2018-05-11 VITALS — BP 132/80 | HR 92 | Temp 98.1°F | Resp 18 | Ht 66.0 in | Wt 158.0 lb

## 2018-05-11 DIAGNOSIS — E2839 Other primary ovarian failure: Secondary | ICD-10-CM | POA: Diagnosis not present

## 2018-05-11 DIAGNOSIS — E034 Atrophy of thyroid (acquired): Secondary | ICD-10-CM | POA: Diagnosis not present

## 2018-05-11 DIAGNOSIS — E782 Mixed hyperlipidemia: Secondary | ICD-10-CM

## 2018-05-11 DIAGNOSIS — Z79899 Other long term (current) drug therapy: Secondary | ICD-10-CM | POA: Diagnosis not present

## 2018-05-11 DIAGNOSIS — R42 Dizziness and giddiness: Secondary | ICD-10-CM | POA: Diagnosis not present

## 2018-05-11 DIAGNOSIS — L4052 Psoriatic arthritis mutilans: Secondary | ICD-10-CM

## 2018-05-11 DIAGNOSIS — M546 Pain in thoracic spine: Secondary | ICD-10-CM | POA: Diagnosis not present

## 2018-05-11 NOTE — Patient Instructions (Signed)
Will call with xray results  Follow up with specialists as scheduled  Keep appt with Clarise Cruz for AWV in 2020  Follow up in 1 yr for CPE/ECG. Fasting labs prior to appt  Keeping You Healthy  Get These Tests  Blood Pressure- Have your blood pressure checked by your healthcare provider at least once a year.  Normal blood pressure is 120/80.  Weight- Have your body mass index (BMI) calculated to screen for obesity.  BMI is a measure of body fat based on height and weight.  You can calculate your own BMI at [employer].it  Cholesterol- Have your cholesterol checked every year.  Diabetes- Have your blood sugar checked every year if you have high blood pressure, high cholesterol, a family history of diabetes or if you are overweight.  Pap Test - Have a pap test every 1 to 5 years if you have been sexually active.  If you are older than 65 and recent pap tests have been normal you may not need additional pap tests.  In addition, if you have had a hysterectomy  for benign disease additional pap tests are not necessary.  Mammogram-Yearly mammograms are essential for early detection of breast cancer  Screening for Colon Cancer- Colonoscopy starting at age 25. Screening may begin sooner depending on your family history and other health conditions.  Follow up colonoscopy as directed by your Gastroenterologist.  Screening for Osteoporosis- Screening begins at age 5 with bone density scanning, sooner if you are at higher risk for developing Osteoporosis.  Get these medicines  Calcium with Vitamin D- Your body requires 1200-1500 mg of Calcium a day and [phone] IU of Vitamin D a day.  You can only absorb 500 mg of Calcium at a time therefore Calcium must be taken in 2 or 3 separate doses throughout the day.  Hormones- Hormone therapy has been associated with increased risk for certain cancers and heart disease.  Talk to your healthcare provider about if you need relief from menopausal  symptoms.  Aspirin- Ask your healthcare provider about taking Aspirin to prevent Heart Disease and Stroke.  Get these Immuniztions  Flu shot- Every fall  Pneumonia shot- Once after the age of 69; if you are younger ask your healthcare provider if you need a pneumonia shot.  Tetanus- Every ten years.  Zostavax- Once after the age of 6 to prevent shingles.  Take these steps  Don't smoke- Your healthcare provider can help you quit. For tips on how to quit, ask your healthcare provider or go to www.smokefree.gov or call 1-800 QUIT-NOW.  Be physically active- Exercise 5 days a week for a minimum of 30 minutes.  If you are not already physically active, start slow and gradually work up to 30 minutes of moderate physical activity.  Try walking, dancing, bike riding, swimming, etc.  Eat a healthy diet- Eat a variety of healthy foods such as fruits, vegetables, whole grains, low fat milk, low fat cheeses, yogurt, lean meats, chicken, fish, eggs, dried beans, tofu, etc.  For more information go to www.thenutritionsource.org  Dental visit- Brush and floss teeth twice daily; visit your dentist twice a year.  Eye exam- Visit your Optometrist or Ophthalmologist yearly.  Drink alcohol in moderation- Limit alcohol intake to one drink or less a day.  Never drink and drive.  Depression- Your emotional health is as important as your physical health.  If you're feeling down or losing interest in things you normally enjoy, please talk to your healthcare provider.  Seat Belts-  can save your life; always wear one  Smoke/Carbon Monoxide detectors- These detectors need to be installed on the appropriate level of your home.  Replace batteries at least once a year.  Violence- If anyone is threatening or hurting you, please tell your healthcare provider.  Living Will/ Health care power of attorney- Discuss with your healthcare provider and family.

## 2018-05-11 NOTE — Progress Notes (Signed)
Patient ID: Sydney Reid, female   DOB: [birth year], 77 y.o.   MRN: [id number]   Location:  [facility]  Place of Service:  OFFICE  Provider: Arletha Grippe, DO  Patient Care Team: Sydney Cranker, DO as PCP - General (Internal Medicine) Sydney Rakers, MD as Referring Physician (Ophthalmology)  Extended Emergency Contact Information Primary Emergency Contact: [place], [place] Address: [address]          [place] [address] [place] Phone: [phone] Mobile Phone: [phone] Relation: None  Code Status:  Goals of Care: Advanced Directive information Advanced Directives 04/30/2018  Does Patient Have a Medical Advance Directive? Yes  Type of Paramedic of [place];Living will  Does patient want to make changes to medical advance directive? No - Patient declined  Copy of [place] in Chart? No - copy requested     Chief Complaint  Patient presents with  . Medical Management of Chronic Issues    HPI: Patient is a 77 y.o. female seen in today for an extended visit. AWV completed 04/30/18. MMSE  30/30. She has pain between her shoulder blades when she sneezes x 4-6 mos. No SOB or CP. No fall prior to pain. She fx her left shoulder after falling 02/19/18. She is followed by Ortho [facility] [place].   She had her mammogram and was told she needs f/u study due to breast calcifications  Hyperlipidemia - reports hives/myalgias with statins. LDL 143; T chol 216; HDL 48. She takes ASA daily  Hypothyroidism - stable on levothroid. TSH 0.87  OSA - dx in 2004. Last sleep study in 2004. She uses BiPAP 12/10 setting qHS. She was followed by sleep specialist in [place], [place] prior to move. She is now seeing [place] for sleep medicine  Allergic urticaria/dermatitis/hx psoriasis - takes Enbrel injections  Vitamin D deficiency - takes Vit D 2 times per month. Vit D 25OH level 31.9  Osteopenia - last DXA in Dec 2016 and showed some  improvement. Takes weekly Vit D  Psoriatic arthritis - takes weekly enbrel injections and daily mobic. She has severe hand/finger joint deformities. Generalized joint pain. Takes norco prn and has started going to pain clinic. Followed by rheumatology [facility] at [place] [facility].     Depression screen [place] 2/9 04/30/2018 12/25/2017 04/24/2017 04/09/2017 05/02/2016  Decreased Interest 0 0 0 0 0  Down, Depressed, Hopeless 0 0 0 0 0  PHQ - 2 Score 0 0 0 0 0    Fall Risk  04/30/2018 12/25/2017 07/08/2017 04/24/2017 04/09/2017  Falls in the past year? Yes No No No No  Number falls in past yr: 1 - - - -  Injury with Fall? Yes - - - -  Comment L shoulder - - - -   MMSE - Mini Mental State Exam 04/30/2018 04/09/2017  Orientation to time 5 5  Orientation to Place 5 5  Registration 3 3  Attention/ Calculation 5 5  Recall 3 3  Language- name 2 objects 2 2  Language- repeat 1 1  Language- follow 3 step command 3 3  Language- read & follow direction 1 1  Write a sentence 1 1  Copy design 1 1  Total score 30 30     Health Maintenance  Topic Date Due  . INFLUENZA VACCINE  05/27/2018  . TETANUS/TDAP  10/28/2019  . DEXA SCAN  Completed  . PNA vac Low Risk Adult  Completed     Past  Medical History:  Diagnosis Date  . H/O total knee replacement 2001   Right  . High cholesterol   . History of tonsillectomy and adenoidectomy 1946  . Psoriatic arthritis ([place]) 1990  . Sleep apnea with use of continuous positive airway pressure (CPAP) 2004  . Thyroid disease     Past Surgical History:  Procedure Laterality Date  . FOOT SURGERY     fused arch in left foot  . SPINE SURGERY    . TOTAL KNEE ARTHROPLASTY Left     Family History  Problem Relation Age of Onset  . Heart attack Father   . Heart disease Sister    Family Status  Relation Name Status  . Father Sydney Reid Deceased at age 76       Heart & Polycystic kidney  . Mother Sydney Reid Deceased at age 79       ALS  . Sister Sydney Reid      Social History   Socioeconomic History  . Marital status: Married    Spouse name: Not on file  . Number of children: Not on file  . Years of education: Not on file  . Highest education level: Not on file  Occupational History  . Not on file  Social Needs  . Financial resource strain: Not hard at all  . Food insecurity:    Worry: Never true    Inability: Never true  . Transportation needs:    Medical: No    Non-medical: No  Tobacco Use  . Smoking status: Former Smoker    Packs/day: 2.00    Years: 18.00    Pack years: 36.00    Types: Cigarettes    Last attempt to quit: 1975    Years since quitting: 44.5  . Smokeless tobacco: Never Used  Substance and Sexual Activity  . Alcohol use: Yes    Alcohol/week: 1.2 - 4.2 oz    Types: 2 - 7 Standard drinks or equivalent per week    Comment: Social drinker, couple times a month  . Drug use: No  . Sexual activity: Not on file  Lifestyle  . Physical activity:    Days per week: 0 days    Minutes per session: 0 min  . Stress: Not at all  Relationships  . Social connections:    Talks on phone: More than three times a week    Gets together: Three times a week    Attends religious service: More than 4 times per year    Active member of club or organization: Yes    Attends meetings of clubs or organizations: More than 4 times per year    Relationship status: Married  . Intimate partner violence:    Fear of current or ex partner: No    Emotionally abused: No    Physically abused: No    Forced sexual activity: No  Other Topics Concern  . Not on file  Social History Narrative   Diet? Normal      Do you drink/eat things with caffeine? Yes, 1 mt. Dew per day      Marital status?              m                      What year were you married? 1988      Do you live in a house, apartment, assisted living, condo, trailer, etc.? Townhouse      Is it one or  more stories? 2 story      How many persons live in your home? 2       Do you have any pets in your home? (please list) no      Current or past profession: Is manager      Do you exercise?     No (did it for 50 years)                       Type & how often?      Do you have a living will? yes      Do you have a DNR form?        yes                          If not, do you want to discuss one?      Do you have signed POA/HPOA for forms?        Allergies  Allergen Reactions  . Codeine Nausea And Vomiting    Weakness and dysequilibrium  . Statins Hives    Myalgias     Allergies as of 05/11/2018      Reactions   Codeine Nausea And Vomiting   Weakness and dysequilibrium   Statins Hives   Myalgias      Medication List        Accurate as of 05/11/18  3:13 PM. Always use your most recent med list.          ENBREL 50 MG/ML injection Generic drug:  etanercept Inject 50 mg into the skin every Sunday.   HYDROcodone-acetaminophen 5-325 MG tablet Commonly known as:  NORCO Take 1-2 tablets by mouth at bedtime.   levothyroxine 88 MCG tablet Commonly known as:  SYNTHROID, LEVOTHROID TAKE ONE TABLET BY MOUTH  ONCE DAILY 30 MINUTES  BEFORE BREAKFAST FOR  THYROID.   omeprazole 20 MG capsule Commonly known as:  PRILOSEC Take 20 mg by mouth daily. Taking Monday,Wednesday, Friday 1 capsule by mouth        Review of Systems:  Review of Systems  Eyes: Positive for visual disturbance (sees "squiggly lines").    Physical Exam: Vitals:   05/11/18 1456  BP: 132/80  Pulse: 92  Resp: 18  Temp: 98.1 F (36.7 C)  TempSrc: Oral  SpO2: 97%  Weight: 158 lb (71.7 kg)  Height: 5\' 6"  (1.676 m)   Body mass index is 25.5 kg/m. Physical Exam  Constitutional: She is oriented to person, place, and time. She appears well-developed and well-nourished.  HENT:  Mouth/Throat: Oropharynx is clear and moist. No oropharyngeal exudate.  MMM; no oral thrush  Eyes: Pupils are equal, round, and reactive to light. No scleral icterus.  Neck: Neck supple. Carotid  bruit is present (b/l systolic from chest). No tracheal deviation present. No thyromegaly present.  Cardiovascular: Normal rate, regular rhythm and intact distal pulses. Exam reveals no gallop and no friction rub.  Murmur (2/6 SEM --> carotid b/l) heard. No LE edema b/l. no calf TTP.   Pulmonary/Chest: Effort normal and breath sounds normal. No stridor. No respiratory distress. She has no wheezes. She has no rales.  Abdominal: Soft. Normal appearance and bowel sounds are normal. She exhibits no distension and no mass. There is no hepatomegaly. There is no tenderness. There is no rigidity, no rebound and no guarding. No hernia.  Musculoskeletal: She exhibits edema and deformity (fingers; thoracic kyphosis).  Lymphadenopathy:    She has  no cervical adenopathy.  Neurological: She is alert and oriented to person, place, and time. She has normal reflexes.  Skin: Skin is warm and dry. No rash noted.  Psychiatric: She has a normal mood and affect. Her behavior is normal. Judgment and thought content normal.    Labs reviewed:  Basic Metabolic Panel: Recent Labs    05/03/18 1000  NA 142  K 4.5  CL 110  CO2 23  GLUCOSE 99  BUN 16  CREATININE 1.06*  CALCIUM 9.6  TSH 0.87   Liver Function Tests: Recent Labs    05/03/18 1000  AST 17  ALT 15  BILITOT 0.5  PROT 7.5   No results for input(s): LIPASE, AMYLASE in the last 8760 hours. No results for input(s): AMMONIA in the last 8760 hours. CBC: Recent Labs    05/03/18 1000  WBC 6.2  NEUTROABS 3,621  HGB 15.1  HCT 47.7*  MCV 81.1  PLT 249   Lipid Panel: Recent Labs    05/03/18 1000  CHOL 216*  HDL 48*  LDLCALC 143*  TRIG 125  CHOLHDL 4.5   No results found for: HGBA1C  Procedures: No results found. ECG OBTAINED AND REVIEWED BY MYSELF: NSR @ 72 bpm, LAD, LAE, poor R wave progression; no acute ischemic changes. no change since 06/2016   Assessment/Plan   ICD-10-CM   1. Dizziness R42 EKG 12-Lead  2. High risk  medication use Z79.899   3. Mixed hyperlipidemia E78.2   4. Hypothyroidism due to acquired atrophy of thyroid E03.4   5. 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 Estrogen deficiency E28.39 DG Bone Density  7. Bilateral thoracic back pain, unspecified chronicity M54.6 DG Thoracic Spine 4V   Will call with xray results  Follow up with specialists as scheduled  Keep appt with Clarise Cruz for AWV in 2020  Follow up in 1 yr for CPE/ECG. Fasting labs prior to appt  Keeping You Healthy handout given   Cordella Register. Perlie Gold  [facility] [address] [place], [place] [address] [phone] Cell (Monday-Friday 8 AM - 5 PM) [phone] After 5 PM and follow prompts

## 2018-05-12 ENCOUNTER — Encounter: Payer: Self-pay | Admitting: Internal Medicine

## 2018-05-12 DIAGNOSIS — R42 Dizziness and giddiness: Secondary | ICD-10-CM | POA: Insufficient documentation

## 2018-05-12 DIAGNOSIS — E034 Atrophy of thyroid (acquired): Secondary | ICD-10-CM | POA: Insufficient documentation

## 2018-05-12 DIAGNOSIS — Z79899 Other long term (current) drug therapy: Secondary | ICD-10-CM | POA: Insufficient documentation

## 2018-05-26 DIAGNOSIS — S42202D Unspecified fracture of upper end of left humerus, subsequent encounter for fracture with routine healing: Secondary | ICD-10-CM | POA: Diagnosis not present

## 2018-05-27 ENCOUNTER — Other Ambulatory Visit: Payer: Self-pay | Admitting: Internal Medicine

## 2018-05-27 MED ORDER — LEVOTHYROXINE SODIUM 88 MCG PO TABS: ORAL_TABLET | ORAL | 0 refills | Status: DC

## 2018-05-27 NOTE — Telephone Encounter (Signed)
Patient called requested 90 day supply be sent to [facility] and a 14 day supply be sent to [facility]. Faxed.

## 2018-05-31 DIAGNOSIS — S42202D Unspecified fracture of upper end of left humerus, subsequent encounter for fracture with routine healing: Secondary | ICD-10-CM | POA: Diagnosis not present

## 2018-06-02 DIAGNOSIS — S42202D Unspecified fracture of upper end of left humerus, subsequent encounter for fracture with routine healing: Secondary | ICD-10-CM | POA: Diagnosis not present

## 2018-06-08 DIAGNOSIS — S42202D Unspecified fracture of upper end of left humerus, subsequent encounter for fracture with routine healing: Secondary | ICD-10-CM | POA: Diagnosis not present

## 2018-06-11 DIAGNOSIS — S42202D Unspecified fracture of upper end of left humerus, subsequent encounter for fracture with routine healing: Secondary | ICD-10-CM | POA: Diagnosis not present

## 2018-06-16 ENCOUNTER — Encounter: Payer: Self-pay | Admitting: Internal Medicine

## 2018-06-16 DIAGNOSIS — S42202D Unspecified fracture of upper end of left humerus, subsequent encounter for fracture with routine healing: Secondary | ICD-10-CM | POA: Diagnosis not present

## 2018-06-18 DIAGNOSIS — S42202D Unspecified fracture of upper end of left humerus, subsequent encounter for fracture with routine healing: Secondary | ICD-10-CM | POA: Diagnosis not present

## 2018-06-21 ENCOUNTER — Other Ambulatory Visit: Payer: Self-pay | Admitting: *Deleted

## 2018-06-21 DIAGNOSIS — G894 Chronic pain syndrome: Secondary | ICD-10-CM

## 2018-06-21 DIAGNOSIS — L4052 Psoriatic arthritis mutilans: Secondary | ICD-10-CM

## 2018-06-21 MED ORDER — HYDROCODONE-ACETAMINOPHEN 5-325 MG PO TABS: 1.0000 | ORAL_TABLET | Freq: Every day | ORAL | 0 refills | Status: DC

## 2018-06-23 DIAGNOSIS — S42202D Unspecified fracture of upper end of left humerus, subsequent encounter for fracture with routine healing: Secondary | ICD-10-CM | POA: Diagnosis not present

## 2018-06-23 LAB — HM MAMMOGRAPHY

## 2018-06-23 LAB — HM DEXA SCAN

## 2018-06-24 DIAGNOSIS — R921 Mammographic calcification found on diagnostic imaging of breast: Secondary | ICD-10-CM | POA: Diagnosis not present

## 2018-06-24 DIAGNOSIS — R922 Inconclusive mammogram: Secondary | ICD-10-CM | POA: Diagnosis not present

## 2018-06-24 DIAGNOSIS — M8589 Other specified disorders of bone density and structure, multiple sites: Secondary | ICD-10-CM | POA: Diagnosis not present

## 2018-06-24 DIAGNOSIS — Z780 Asymptomatic menopausal state: Secondary | ICD-10-CM | POA: Diagnosis not present

## 2018-06-24 LAB — HM DEXA SCAN

## 2018-06-25 ENCOUNTER — Encounter: Payer: Self-pay | Admitting: *Deleted

## 2018-06-25 DIAGNOSIS — S42202D Unspecified fracture of upper end of left humerus, subsequent encounter for fracture with routine healing: Secondary | ICD-10-CM | POA: Diagnosis not present

## 2018-06-30 DIAGNOSIS — S42202D Unspecified fracture of upper end of left humerus, subsequent encounter for fracture with routine healing: Secondary | ICD-10-CM | POA: Diagnosis not present

## 2018-07-02 ENCOUNTER — Telehealth: Payer: Self-pay | Admitting: *Deleted

## 2018-07-02 NOTE — Telephone Encounter (Signed)
Per Dr. Eulas Post: Bone density is low- continue vitamin D as ordered, repeat DXA in 2 years. You are at increased risk of fracture: low impact exercises ar recommended.

## 2018-07-02 NOTE — Telephone Encounter (Signed)
Left message for patient to return call to the office for results of her bone density.

## 2018-07-06 ENCOUNTER — Other Ambulatory Visit: Payer: Self-pay

## 2018-07-06 DIAGNOSIS — Z79899 Other long term (current) drug therapy: Secondary | ICD-10-CM

## 2018-07-06 DIAGNOSIS — E034 Atrophy of thyroid (acquired): Secondary | ICD-10-CM

## 2018-07-06 DIAGNOSIS — E782 Mixed hyperlipidemia: Secondary | ICD-10-CM

## 2018-07-07 DIAGNOSIS — S42202D Unspecified fracture of upper end of left humerus, subsequent encounter for fracture with routine healing: Secondary | ICD-10-CM | POA: Diagnosis not present

## 2018-08-13 ENCOUNTER — Other Ambulatory Visit: Payer: Self-pay

## 2018-08-13 DIAGNOSIS — G894 Chronic pain syndrome: Secondary | ICD-10-CM

## 2018-08-13 DIAGNOSIS — L4052 Psoriatic arthritis mutilans: Secondary | ICD-10-CM

## 2018-08-13 NOTE — Telephone Encounter (Signed)
Patient called to request a refill on hydrocodone 5/325 mg. Rx was pended to provider for approval  after verifying last fill date, provider, and quantity on PMP [place]

## 2018-08-14 MED ORDER — HYDROCODONE-ACETAMINOPHEN 5-325 MG PO TABS: 1.0000 | ORAL_TABLET | Freq: Every day | ORAL | 0 refills | Status: DC

## 2018-08-26 ENCOUNTER — Encounter: Payer: Self-pay | Admitting: Nurse Practitioner

## 2018-09-01 DIAGNOSIS — M2042 Other hammer toe(s) (acquired), left foot: Secondary | ICD-10-CM | POA: Diagnosis not present

## 2018-09-01 DIAGNOSIS — B351 Tinea unguium: Secondary | ICD-10-CM | POA: Diagnosis not present

## 2018-09-09 DIAGNOSIS — Z23 Encounter for immunization: Secondary | ICD-10-CM | POA: Diagnosis not present

## 2018-10-14 ENCOUNTER — Other Ambulatory Visit: Payer: Self-pay | Admitting: *Deleted

## 2018-10-14 DIAGNOSIS — L4052 Psoriatic arthritis mutilans: Secondary | ICD-10-CM

## 2018-10-14 DIAGNOSIS — G894 Chronic pain syndrome: Secondary | ICD-10-CM

## 2018-10-14 MED ORDER — HYDROCODONE-ACETAMINOPHEN 5-325 MG PO TABS: 1.0000 | ORAL_TABLET | Freq: Every day | ORAL | 0 refills | Status: DC

## 2018-10-14 NOTE — Telephone Encounter (Signed)
Patient requested refill. Has upcoming appointment. [place] Verified LR: 08/14/2018 Pended Rx and sent to [facility] for approval.

## 2018-11-10 DIAGNOSIS — L4050 Arthropathic psoriasis, unspecified: Secondary | ICD-10-CM | POA: Diagnosis not present

## 2018-11-10 DIAGNOSIS — M545 Low back pain: Secondary | ICD-10-CM | POA: Diagnosis not present

## 2018-11-10 DIAGNOSIS — M1990 Unspecified osteoarthritis, unspecified site: Secondary | ICD-10-CM | POA: Diagnosis not present

## 2018-11-10 DIAGNOSIS — M2550 Pain in unspecified joint: Secondary | ICD-10-CM | POA: Diagnosis not present

## 2018-11-10 DIAGNOSIS — M8580 Other specified disorders of bone density and structure, unspecified site: Secondary | ICD-10-CM | POA: Diagnosis not present

## 2018-11-10 DIAGNOSIS — L409 Psoriasis, unspecified: Secondary | ICD-10-CM | POA: Diagnosis not present

## 2018-11-10 DIAGNOSIS — Z79899 Other long term (current) drug therapy: Secondary | ICD-10-CM | POA: Diagnosis not present

## 2018-11-26 DIAGNOSIS — H1851 Endothelial corneal dystrophy: Secondary | ICD-10-CM | POA: Diagnosis not present

## 2018-12-01 DIAGNOSIS — G8929 Other chronic pain: Secondary | ICD-10-CM | POA: Diagnosis not present

## 2018-12-01 DIAGNOSIS — R262 Difficulty in walking, not elsewhere classified: Secondary | ICD-10-CM | POA: Diagnosis not present

## 2018-12-01 DIAGNOSIS — M6281 Muscle weakness (generalized): Secondary | ICD-10-CM | POA: Diagnosis not present

## 2018-12-01 DIAGNOSIS — M545 Low back pain: Secondary | ICD-10-CM | POA: Diagnosis not present

## 2018-12-06 DIAGNOSIS — R6883 Chills (without fever): Secondary | ICD-10-CM | POA: Diagnosis not present

## 2018-12-06 DIAGNOSIS — J019 Acute sinusitis, unspecified: Secondary | ICD-10-CM | POA: Diagnosis not present

## 2018-12-08 DIAGNOSIS — G8929 Other chronic pain: Secondary | ICD-10-CM | POA: Diagnosis not present

## 2018-12-08 DIAGNOSIS — M6281 Muscle weakness (generalized): Secondary | ICD-10-CM | POA: Diagnosis not present

## 2018-12-08 DIAGNOSIS — M545 Low back pain: Secondary | ICD-10-CM | POA: Diagnosis not present

## 2018-12-08 DIAGNOSIS — R262 Difficulty in walking, not elsewhere classified: Secondary | ICD-10-CM | POA: Diagnosis not present

## 2018-12-13 ENCOUNTER — Other Ambulatory Visit: Payer: Self-pay | Admitting: *Deleted

## 2018-12-13 DIAGNOSIS — M6281 Muscle weakness (generalized): Secondary | ICD-10-CM | POA: Diagnosis not present

## 2018-12-13 DIAGNOSIS — G8929 Other chronic pain: Secondary | ICD-10-CM | POA: Diagnosis not present

## 2018-12-13 DIAGNOSIS — M545 Low back pain: Secondary | ICD-10-CM | POA: Diagnosis not present

## 2018-12-13 DIAGNOSIS — R262 Difficulty in walking, not elsewhere classified: Secondary | ICD-10-CM | POA: Diagnosis not present

## 2018-12-13 DIAGNOSIS — G894 Chronic pain syndrome: Secondary | ICD-10-CM

## 2018-12-13 DIAGNOSIS — L4052 Psoriatic arthritis mutilans: Secondary | ICD-10-CM

## 2018-12-13 MED ORDER — HYDROCODONE-ACETAMINOPHEN 5-325 MG PO TABS: 1.0000 | ORAL_TABLET | Freq: Every day | ORAL | 0 refills | Status: DC

## 2018-12-13 NOTE — Telephone Encounter (Signed)
Patient requested refill [place] Verified LR: 10/14/2018 Patient scheduled to see Janett Billow

## 2018-12-15 DIAGNOSIS — M6281 Muscle weakness (generalized): Secondary | ICD-10-CM | POA: Diagnosis not present

## 2018-12-15 DIAGNOSIS — R262 Difficulty in walking, not elsewhere classified: Secondary | ICD-10-CM | POA: Diagnosis not present

## 2018-12-15 DIAGNOSIS — G8929 Other chronic pain: Secondary | ICD-10-CM | POA: Diagnosis not present

## 2018-12-15 DIAGNOSIS — M545 Low back pain: Secondary | ICD-10-CM | POA: Diagnosis not present

## 2018-12-20 DIAGNOSIS — Z803 Family history of malignant neoplasm of breast: Secondary | ICD-10-CM | POA: Diagnosis not present

## 2018-12-20 DIAGNOSIS — Z1231 Encounter for screening mammogram for malignant neoplasm of breast: Secondary | ICD-10-CM | POA: Diagnosis not present

## 2018-12-20 LAB — HM MAMMOGRAPHY

## 2018-12-21 ENCOUNTER — Encounter: Payer: Self-pay | Admitting: *Deleted

## 2018-12-22 DIAGNOSIS — G8929 Other chronic pain: Secondary | ICD-10-CM | POA: Diagnosis not present

## 2018-12-22 DIAGNOSIS — R262 Difficulty in walking, not elsewhere classified: Secondary | ICD-10-CM | POA: Diagnosis not present

## 2018-12-22 DIAGNOSIS — M6281 Muscle weakness (generalized): Secondary | ICD-10-CM | POA: Diagnosis not present

## 2018-12-22 DIAGNOSIS — M545 Low back pain: Secondary | ICD-10-CM | POA: Diagnosis not present

## 2018-12-30 DIAGNOSIS — B351 Tinea unguium: Secondary | ICD-10-CM | POA: Diagnosis not present

## 2018-12-30 DIAGNOSIS — M79675 Pain in left toe(s): Secondary | ICD-10-CM | POA: Diagnosis not present

## 2018-12-30 DIAGNOSIS — M79674 Pain in right toe(s): Secondary | ICD-10-CM | POA: Diagnosis not present

## 2019-01-03 DIAGNOSIS — R262 Difficulty in walking, not elsewhere classified: Secondary | ICD-10-CM | POA: Diagnosis not present

## 2019-01-03 DIAGNOSIS — M545 Low back pain: Secondary | ICD-10-CM | POA: Diagnosis not present

## 2019-01-03 DIAGNOSIS — G8929 Other chronic pain: Secondary | ICD-10-CM | POA: Diagnosis not present

## 2019-01-03 DIAGNOSIS — M6281 Muscle weakness (generalized): Secondary | ICD-10-CM | POA: Diagnosis not present

## 2019-02-08 ENCOUNTER — Other Ambulatory Visit: Payer: Self-pay | Admitting: *Deleted

## 2019-02-08 DIAGNOSIS — L4052 Psoriatic arthritis mutilans: Secondary | ICD-10-CM

## 2019-02-08 DIAGNOSIS — G894 Chronic pain syndrome: Secondary | ICD-10-CM

## 2019-02-08 MED ORDER — HYDROCODONE-ACETAMINOPHEN 5-325 MG PO TABS: 1.0000 | ORAL_TABLET | Freq: Every day | ORAL | 0 refills | Status: DC

## 2019-02-08 NOTE — Telephone Encounter (Signed)
Patient requested refill [place] Database Verified LR: 12/13/2018 Pended Rx and sent to [facility] for approval.

## 2019-02-20 ENCOUNTER — Encounter: Payer: Self-pay | Admitting: Nurse Practitioner

## 2019-03-21 ENCOUNTER — Emergency Department (HOSPITAL_COMMUNITY): Payer: Medicare Other

## 2019-03-21 ENCOUNTER — Emergency Department (HOSPITAL_COMMUNITY)
Admission: EM | Admit: 2019-03-21 | Discharge: 2019-03-21 | Disposition: A | Discharge status: Home / Self Care | Payer: Medicare Other | Attending: Emergency Medicine | Admitting: Emergency Medicine

## 2019-03-21 ENCOUNTER — Other Ambulatory Visit: Payer: Self-pay

## 2019-03-21 ENCOUNTER — Encounter (HOSPITAL_COMMUNITY): Payer: Self-pay | Admitting: Emergency Medicine

## 2019-03-21 DIAGNOSIS — K8020 Calculus of gallbladder without cholecystitis without obstruction: Secondary | ICD-10-CM | POA: Diagnosis not present

## 2019-03-21 DIAGNOSIS — N23 Unspecified renal colic: Secondary | ICD-10-CM

## 2019-03-21 DIAGNOSIS — E039 Hypothyroidism, unspecified: Secondary | ICD-10-CM | POA: Insufficient documentation

## 2019-03-21 DIAGNOSIS — R109 Unspecified abdominal pain: Secondary | ICD-10-CM | POA: Diagnosis not present

## 2019-03-21 DIAGNOSIS — Z79899 Other long term (current) drug therapy: Secondary | ICD-10-CM | POA: Diagnosis not present

## 2019-03-21 DIAGNOSIS — Z87891 Personal history of nicotine dependence: Secondary | ICD-10-CM | POA: Diagnosis not present

## 2019-03-21 DIAGNOSIS — Z96651 Presence of right artificial knee joint: Secondary | ICD-10-CM | POA: Diagnosis not present

## 2019-03-21 LAB — COMPREHENSIVE METABOLIC PANEL
ALT: 15 U/L (ref 0–44)
AST: 24 U/L (ref 15–41)
Albumin: 4.1 g/dL (ref 3.5–5.0)
Alkaline Phosphatase: 68 U/L (ref 38–126)
Anion gap: 15 (ref 5–15)
BUN: 18 mg/dL (ref 8–23)
CO2: 20 mmol/L — ABNORMAL LOW (ref 22–32)
Calcium: 9.5 mg/dL (ref 8.9–10.3)
Chloride: 106 mmol/L (ref 98–111)
Creatinine, Ser: 1.12 mg/dL — ABNORMAL HIGH (ref 0.44–1.00)
GFR calc Af Amer: 54 mL/min — ABNORMAL LOW (ref >60)
GFR calc non Af Amer: 47 mL/min — ABNORMAL LOW (ref >60)
Glucose, Bld: 148 mg/dL — ABNORMAL HIGH (ref 70–99)
Potassium: 4.5 mmol/L (ref 3.5–5.1)
Sodium: 141 mmol/L (ref 135–145)
Total Bilirubin: 1.3 mg/dL — ABNORMAL HIGH (ref 0.3–1.2)
Total Protein: 7.7 g/dL (ref 6.5–8.1)

## 2019-03-21 LAB — URINALYSIS, ROUTINE W REFLEX MICROSCOPIC
Bacteria, UA: NONE SEEN
Bilirubin Urine: NEGATIVE
Glucose, UA: NEGATIVE mg/dL
Ketones, ur: 5 mg/dL — AB
Leukocytes,Ua: NEGATIVE
Nitrite: NEGATIVE
Protein, ur: NEGATIVE mg/dL
Specific Gravity, Urine: 1.013 (ref 1.005–1.030)
pH: 5 (ref 5.0–8.0)

## 2019-03-21 LAB — CBC
HCT: 51.4 % — ABNORMAL HIGH (ref 36.0–46.0)
Hemoglobin: 15.7 g/dL — ABNORMAL HIGH (ref 12.0–15.0)
MCH: 26 pg (ref 26.0–34.0)
MCHC: 30.5 g/dL (ref 30.0–36.0)
MCV: 85.1 fL (ref 80.0–100.0)
Platelets: 208 10*3/uL (ref 150–400)
RBC: 6.04 MIL/uL — ABNORMAL HIGH (ref 3.87–5.11)
RDW: 13.9 % (ref 11.5–15.5)
WBC: 7 10*3/uL (ref 4.0–10.5)
nRBC: 0 % (ref 0.0–0.2)

## 2019-03-21 LAB — LIPASE, BLOOD: Lipase: 34 U/L (ref 11–51)

## 2019-03-21 IMAGING — CT CT RENAL STONE PROTOCOL
2 of 4 series · 16 of 46 positions shown, 18 images · non-contrast
Comparison: None.

CLINICAL DATA: Right-sided abdominal pain, history of renal stones

EXAM:
CT ABDOMEN AND PELVIS WITHOUT CONTRAST
TECHNIQUE: Multidetector CT imaging of the abdomen and pelvis was performed
following the standard protocol without IV contrast.

[Series 3: stone study 5.0 i30f 2 · axial · 0.74mm/px · z∈[+771,+1136]mm · 13 of 81 slices shown, 15 images]
[im 4/81  soft-tissue]
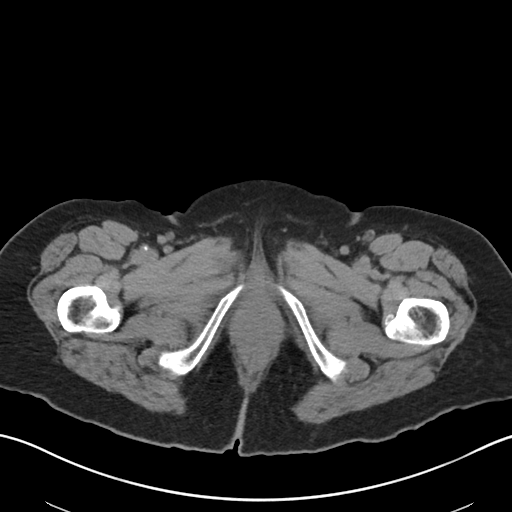
[im 4/81  bone]
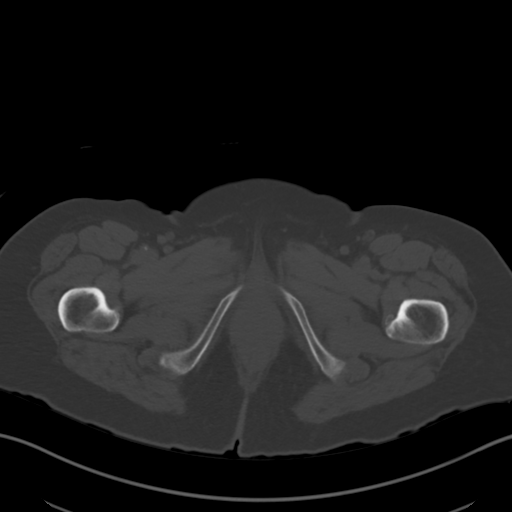
[im 11/81  soft-tissue]
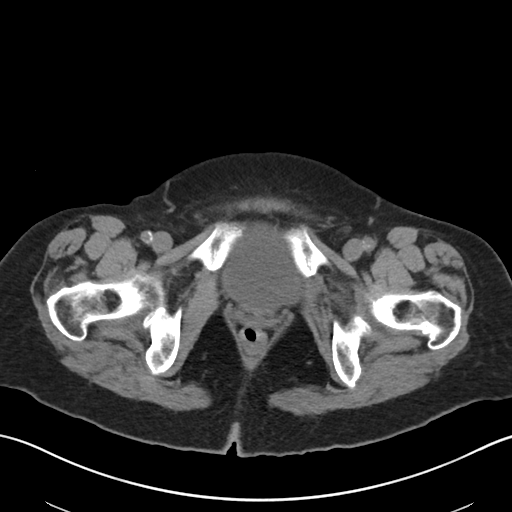
[im 17/81  soft-tissue]
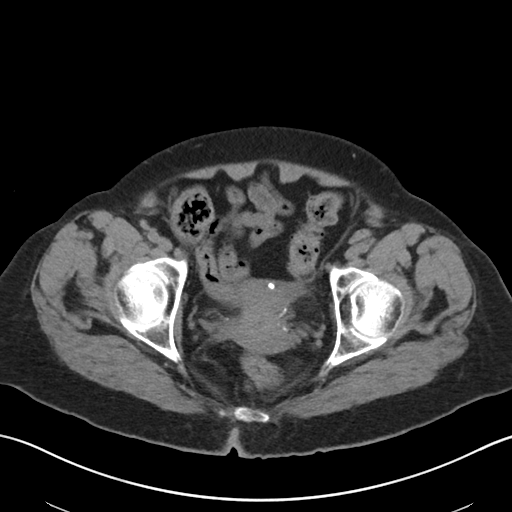
[im 24/81  soft-tissue]
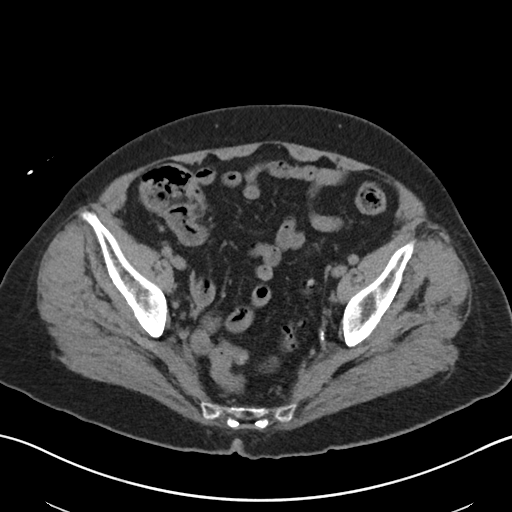
[im 27/81  soft-tissue]
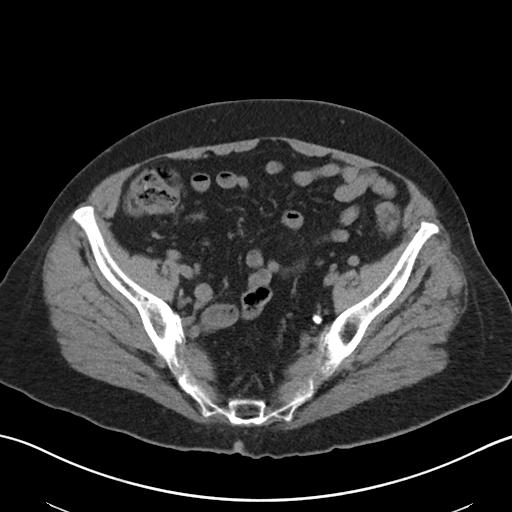
[im 34/81  soft-tissue]
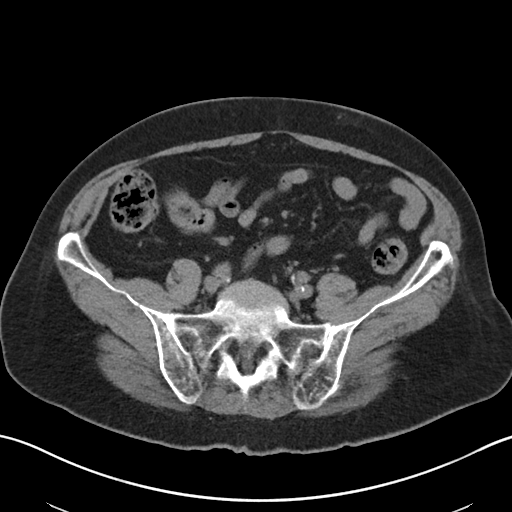
[im 41/81  soft-tissue]
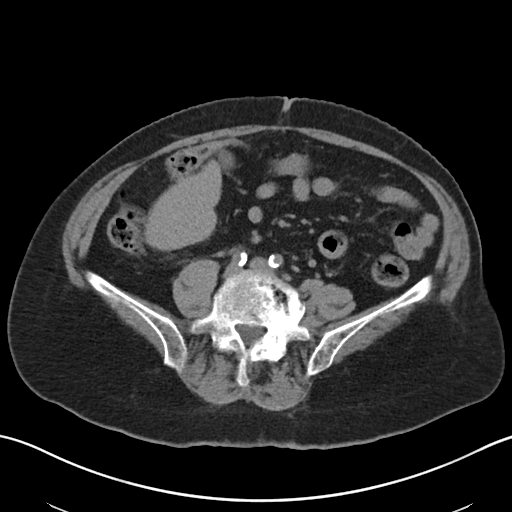
[im 47/81  soft-tissue]
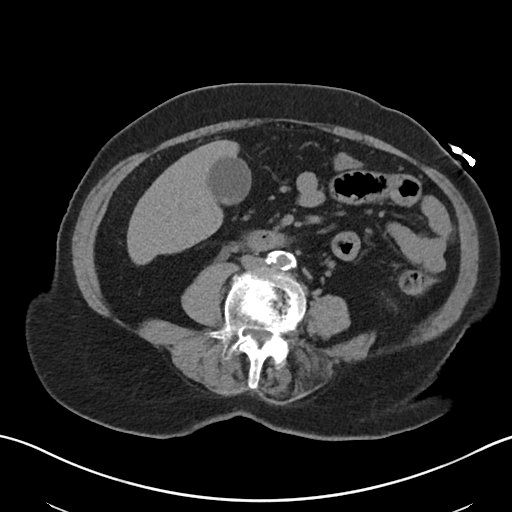
[im 54/81  soft-tissue]
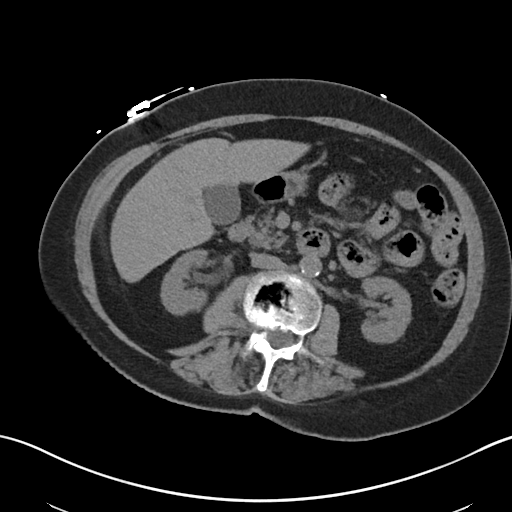
[im 54/81  bone]
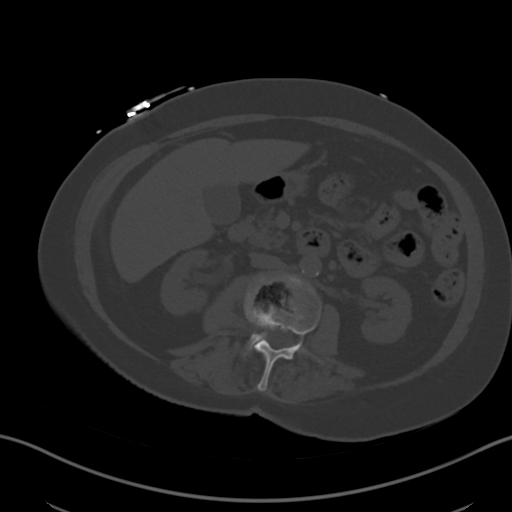
[im 57/81  soft-tissue]
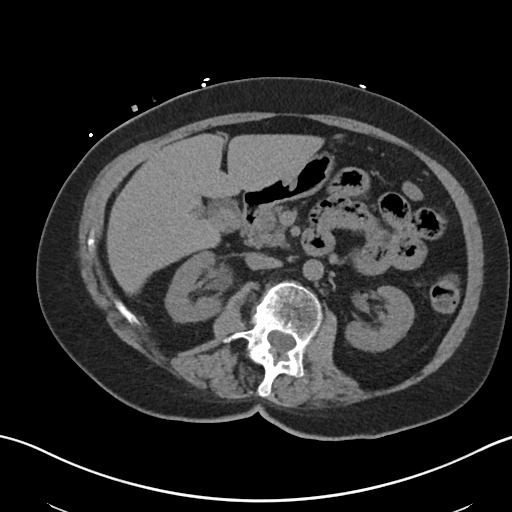
[im 64/81  soft-tissue]
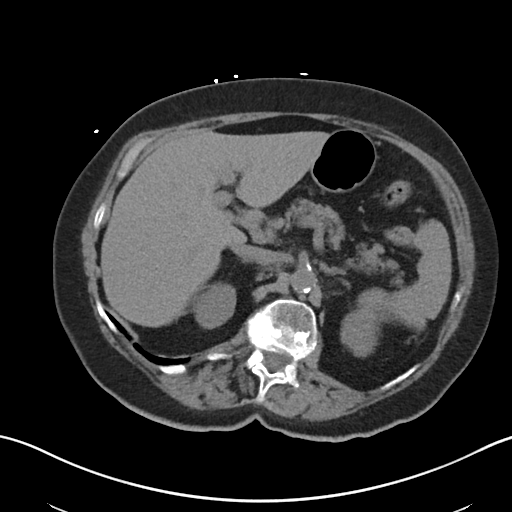
[im 71/81  soft-tissue]
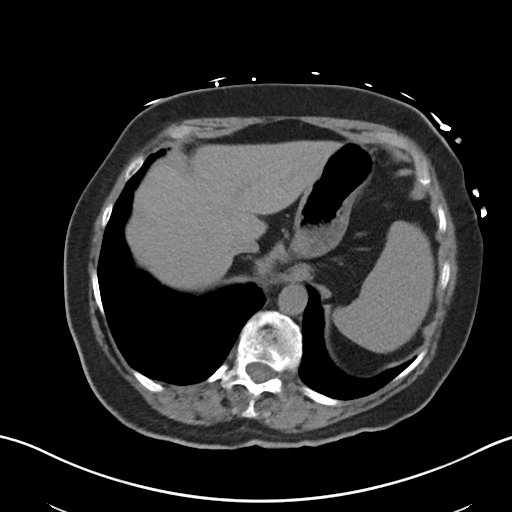
[im 77/81  soft-tissue]
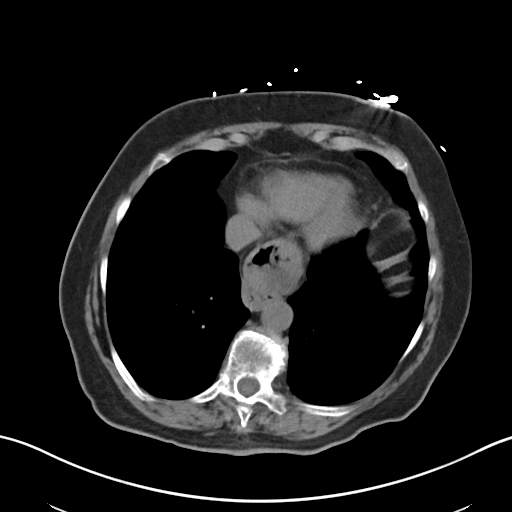

[Series 6: coronal soft tissue · coronal · 0.76mm/px · 3 of 101 slices shown]
[im 34/101  soft-tissue]
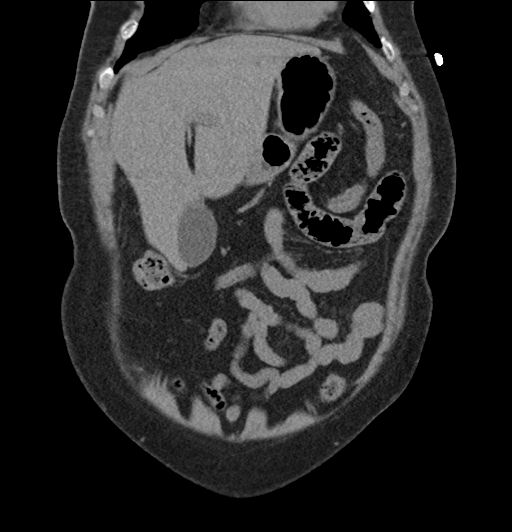
[im 45/101  soft-tissue]
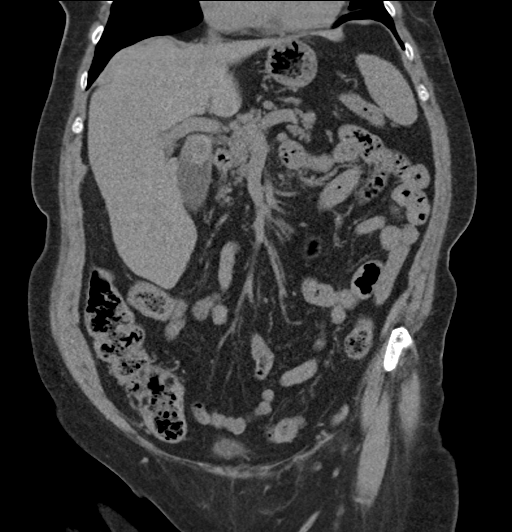
[im 56/101  soft-tissue]
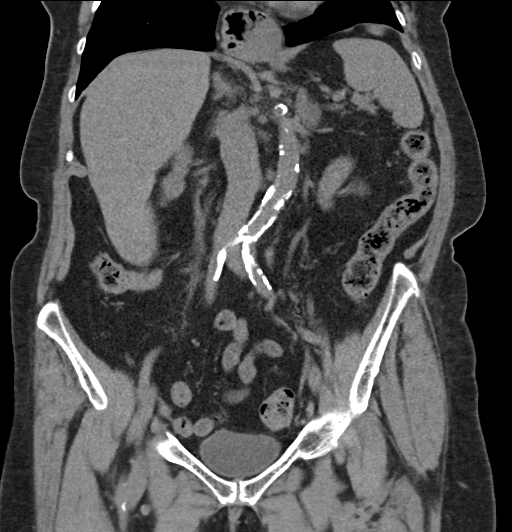

[16 of 46 positions shown; findings below may reference images not displayed]

FINDINGS: Lower chest: No acute abnormality.  Small hiatal hernia.

Hepatobiliary: No focal liver abnormality is seen. Large gallstone
near the gallbladder neck. Gallbladder wall thickening, or biliary
dilatation.

Pancreas: Unremarkable. No pancreatic ductal dilatation or
surrounding inflammatory changes.

Spleen: Normal in size without focal abnormality.

Adrenals/Urinary Tract: Adrenal glands are unremarkable. There is a
2 mm nonobstructive calculus of the inferior pole of the left
kidney. There is no right-sided renal or ureteral calculus. There is
mild fat stranding about the right ureter. No hydronephrosis. There
is a 2 mm calculus in the dependent urinary bladder near the left
ureterovesicular junction (series 3, image 67).

Stomach/Bowel: Stomach is within normal limits. Appendix appears
normal. No evidence of bowel wall thickening, distention, or
inflammatory changes. Occasional sigmoid diverticula.

Vascular/Lymphatic: Calcific atherosclerosis. No enlarged abdominal
or pelvic lymph nodes.

Reproductive: No mass or other abnormality. Calcified uterine
fibroids.

Other: No abdominal wall hernia or abnormality. No abdominopelvic
ascites.

Musculoskeletal: No acute or significant osseous findings.
IMPRESSION: 1. There is a 2 mm nonobstructive calculus of the inferior pole of
the left kidney. There is no right-sided renal or ureteral calculus.
There is mild fat stranding about the right ureter. No
hydronephrosis. There is a 2 mm calculus in the dependent urinary
bladder near the left ureterovesicular junction (series 3, image
67). Findings may reflect a recently passed right-sided urinary
tract calculus given reported symptoms.

2. Large gallstone near the gallbladder neck. No gallbladder wall
thickening, pericholecystic fluid, or biliary ductal dilatation.

3.  Other chronic and incidental findings as detailed above.

## 2019-03-21 NOTE — Discharge Instructions (Signed)
It was our pleasure to provide your ER care today - we hope that you feel better.  Your ct scan shows gallstones. It also shows a recently passed kidney stone in the bladder (I.e. you may have passed a small right sided kidney stone). The ct also makes incidental note of a small, 2 mm left kidney stone.   Follow up with primary care doctor in the coming week - discuss ct results with them.   Return to ER if worse, new symptoms, new or severe pain, persistent vomiting, fevers, other concern.

## 2019-03-21 NOTE — ED Provider Notes (Signed)
[facility] EMERGENCY DEPARTMENT Provider Note   CSN: [id number] Arrival date & time: 03/21/19  1144    History   Chief Complaint Chief Complaint  Patient presents with   Abdominal Pain   Back Pain    HPI Sydney Reid is a 78 y.o. female.     Patient c/o acute onset right flank pain just pta today. Pain right flank posterior/laterally, sudden onset at rest, constant, mod-severe, radiated towards right groin. +nausea. No vomiting or diarrhea. Remote hx kidney stone, pain similar. Denies chest pain or discomfort. No sob or unusual doe. No fever or chills. No dysuria or hematuria.   The history is provided by the patient.  Abdominal Pain  Associated symptoms: nausea   Associated symptoms: no chest pain, no chills, no dysuria, no fever, no hematuria, no shortness of breath and no sore throat   Back Pain  Associated symptoms: abdominal pain   Associated symptoms: no chest pain, no dysuria, no fever and no headaches     Past Medical History:  Diagnosis Date   H/O total knee replacement 2001   Right   High cholesterol    History of tonsillectomy and adenoidectomy 1946   Psoriatic arthritis ([place]) 1990   Sleep apnea with use of continuous positive airway pressure (CPAP) 2004   Thyroid disease     Patient Active Problem List   Diagnosis Date Noted   High risk medication use 05/12/2018   Hypothyroidism due to acquired atrophy of thyroid 05/12/2018   Dizziness 05/12/2018   Chronic pain syndrome 07/08/2017   Psoriatic arthritis, 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      Home Medications    Prior to Admission medications   Medication Sig Start Date End Date Taking?  Authorizing Provider  etanercept (ENBREL) 50 MG/ML injection Inject 50 mg into the skin every Sunday.     [provider]  HYDROcodone-acetaminophen (NORCO) 5-325 MG tablet Take 1-2 tablets by mouth at bedtime. 02/08/19   [name], NP  levothyroxine (SYNTHROID, LEVOTHROID) 88 MCG tablet Take one tablet by mouth once daily 30 minutes before breakfast on an empty stomach for thyroid 05/27/18   Gildardo Cranker, DO  omeprazole (PRILOSEC) 20 MG capsule Take 20 mg by mouth daily. Taking Monday,Wednesday, Friday 1 capsule by mouth    [provider]    Family History Family History  Problem Relation Age of Onset   Heart attack Father    Heart disease Sister     Social History Social History   Tobacco Use   Smoking status: Former Smoker    Packs/day: 2.00    Years: 18.00    Pack years: 36.00    Types: Cigarettes    Last attempt to quit: 1975    Years since quitting: 45.4   Smokeless tobacco: Never Used  Substance Use Topics   Alcohol use: Yes    Alcohol/week: 2.0 - 7.0 standard drinks    Types: 2 - 7 Standard drinks or equivalent per week    Comment: Social drinker, couple times a month   Drug use: No     Allergies   Codeine and Statins   Review of  Systems Review of Systems  Constitutional: Negative for chills and fever.  HENT: Negative for sore throat.   Eyes: Negative for redness.  Respiratory: Negative for shortness of breath.   Cardiovascular: Negative for chest pain.  Gastrointestinal: Positive for abdominal pain and nausea.  Endocrine: Negative for polyuria.  Genitourinary: Positive for flank pain. Negative for dysuria and hematuria.  Musculoskeletal: Positive for back pain. Negative for neck pain.  Skin: Negative for rash.  Neurological: Negative for headaches.  Hematological: Does not bruise/bleed easily.  Psychiatric/Behavioral: Negative for confusion.     Physical Exam Updated Vital Signs BP (!) 167/65 (BP Location: Right Arm)  Comment: Simultaneous filing. User may not have seen previous data.   Pulse 64 Comment: Simultaneous filing. User may not have seen previous data.   Temp 97.8 F (36.6 C) (Oral)    Resp 16    SpO2 100%   Physical Exam Vitals signs and nursing note reviewed.  Constitutional:      Appearance: Normal appearance. She is well-developed.  HENT:     Head: Atraumatic.     Nose: Nose normal.     Mouth/Throat:     Mouth: Mucous membranes are moist.  Eyes:     General: No scleral icterus.    Conjunctiva/sclera: Conjunctivae normal.  Neck:     Musculoskeletal: Normal range of motion and neck supple. No neck rigidity or muscular tenderness.     Trachea: No tracheal deviation.  Cardiovascular:     Rate and Rhythm: Normal rate and regular rhythm.     Pulses: Normal pulses.     Heart sounds: Normal heart sounds. No murmur. No friction rub. No gallop.   Pulmonary:     Effort: Pulmonary effort is normal. No respiratory distress.     Breath sounds: Normal breath sounds.  Abdominal:     General: Bowel sounds are normal. There is no distension.     Palpations: Abdomen is soft. There is no mass.     Tenderness: There is no abdominal tenderness. There is no guarding or rebound.     Hernia: No hernia is present.  Genitourinary:    Comments: No cva tenderness.  Musculoskeletal:        General: No swelling.  Skin:    General: Skin is warm and dry.     Findings: No rash.  Neurological:     Mental Status: She is alert.     Comments: Alert, speech normal.   Psychiatric:        Mood and Affect: Mood normal.      ED Treatments / Results  Labs (all labs ordered are listed, but only abnormal results are displayed) Results for orders placed or performed during the hospital encounter of 03/21/19  Urinalysis, Routine w reflex microscopic  Result Value Ref Range   Color, Urine YELLOW YELLOW   APPearance CLEAR CLEAR   Specific Gravity, Urine 1.013 1.005 - 1.030   pH 5.0 5.0 - 8.0   Glucose, UA  NEGATIVE NEGATIVE mg/dL   Hgb urine dipstick SMALL (A) NEGATIVE   Bilirubin Urine NEGATIVE NEGATIVE   Ketones, ur 5 (A) NEGATIVE mg/dL   Protein, ur NEGATIVE NEGATIVE mg/dL   Nitrite NEGATIVE NEGATIVE   Leukocytes,Ua NEGATIVE NEGATIVE   RBC / HPF 0-5 0 - 5 RBC/hpf   WBC, UA 0-5 0 - 5 WBC/hpf   Bacteria, UA NONE SEEN NONE SEEN   Squamous Epithelial / LPF 0-5 0 - 5   Mucus PRESENT   CBC  Result Value Ref  Range   WBC 7.0 4.0 - 10.5 K/uL   RBC 6.04 (H) 3.87 - 5.11 MIL/uL   Hemoglobin 15.7 (H) 12.0 - 15.0 g/dL   HCT 51.4 (H) 36.0 - 46.0 %   MCV 85.1 80.0 - 100.0 fL   MCH 26.0 26.0 - 34.0 pg   MCHC 30.5 30.0 - 36.0 g/dL   RDW 13.9 11.5 - 15.5 %   Platelets 208 150 - 400 K/uL   nRBC 0.0 0.0 - 0.2 %  Comprehensive metabolic panel  Result Value Ref Range   Sodium 141 135 - 145 mmol/L   Potassium 4.5 3.5 - 5.1 mmol/L   Chloride 106 98 - 111 mmol/L   CO2 20 (L) 22 - 32 mmol/L   Glucose, Bld 148 (H) 70 - 99 mg/dL   BUN 18 8 - 23 mg/dL   Creatinine, Ser 1.12 (H) 0.44 - 1.00 mg/dL   Calcium 9.5 8.9 - 10.3 mg/dL   Total Protein 7.7 6.5 - 8.1 g/dL   Albumin 4.1 3.5 - 5.0 g/dL   AST 24 15 - 41 U/L   ALT 15 0 - 44 U/L   Alkaline Phosphatase 68 38 - 126 U/L   Total Bilirubin 1.3 (H) 0.3 - 1.2 mg/dL   GFR calc non Af Amer 47 (L) >60 mL/min   GFR calc Af Amer 54 (L) >60 mL/min   Anion gap 15 5 - 15  Lipase, blood  Result Value Ref Range   Lipase 34 11 - 51 U/L   Ct Renal Stone Study  Result Date: 03/21/2019 CLINICAL DATA:  Right-sided abdominal pain, history of renal stones EXAM: CT ABDOMEN AND PELVIS WITHOUT CONTRAST TECHNIQUE: Multidetector CT imaging of the abdomen and pelvis was performed following the standard protocol without IV contrast. COMPARISON:  None. FINDINGS: Lower chest: No acute abnormality.  Small hiatal hernia. Hepatobiliary: No focal liver abnormality is seen. Large gallstone near the gallbladder neck. Gallbladder wall thickening, or biliary dilatation. Pancreas:  Unremarkable. No pancreatic ductal dilatation or surrounding inflammatory changes. Spleen: Normal in size without focal abnormality. Adrenals/Urinary Tract: Adrenal glands are unremarkable. There is a 2 mm nonobstructive calculus of the inferior pole of the left kidney. There is no right-sided renal or ureteral calculus. There is mild fat stranding about the right ureter. No hydronephrosis. There is a 2 mm calculus in the dependent urinary bladder near the left ureterovesicular junction (series 3, image 67). Stomach/Bowel: Stomach is within normal limits. Appendix appears normal. No evidence of bowel wall thickening, distention, or inflammatory changes. Occasional sigmoid diverticula. Vascular/Lymphatic: Calcific atherosclerosis. No enlarged abdominal or pelvic lymph nodes. Reproductive: No mass or other abnormality. Calcified uterine fibroids. Other: No abdominal wall hernia or abnormality. No abdominopelvic ascites. Musculoskeletal: No acute or significant osseous findings. IMPRESSION: 1. There is a 2 mm nonobstructive calculus of the inferior pole of the left kidney. There is no right-sided renal or ureteral calculus. There is mild fat stranding about the right ureter. No hydronephrosis. There is a 2 mm calculus in the dependent urinary bladder near the left ureterovesicular junction (series 3, image 67). Findings may reflect a recently passed right-sided urinary tract calculus given reported symptoms. 2. Large gallstone near the gallbladder neck. No gallbladder wall thickening, pericholecystic fluid, or biliary ductal dilatation. 3.  Other chronic and incidental findings as detailed above. Electronically Signed   By: Eddie Candle M.D.   On: 03/21/2019 13:00    EKG None  Radiology Ct Renal Stone Study  Result Date: 03/21/2019 CLINICAL DATA:  Right-sided abdominal pain, history of renal stones EXAM: CT ABDOMEN AND PELVIS WITHOUT CONTRAST TECHNIQUE: Multidetector CT imaging of the abdomen and pelvis was  performed following the standard protocol without IV contrast. COMPARISON:  None. FINDINGS: Lower chest: No acute abnormality.  Small hiatal hernia. Hepatobiliary: No focal liver abnormality is seen. Large gallstone near the gallbladder neck. Gallbladder wall thickening, or biliary dilatation. Pancreas: Unremarkable. No pancreatic ductal dilatation or surrounding inflammatory changes. Spleen: Normal in size without focal abnormality. Adrenals/Urinary Tract: Adrenal glands are unremarkable. There is a 2 mm nonobstructive calculus of the inferior pole of the left kidney. There is no right-sided renal or ureteral calculus. There is mild fat stranding about the right ureter. No hydronephrosis. There is a 2 mm calculus in the dependent urinary bladder near the left ureterovesicular junction (series 3, image 67). Stomach/Bowel: Stomach is within normal limits. Appendix appears normal. No evidence of bowel wall thickening, distention, or inflammatory changes. Occasional sigmoid diverticula. Vascular/Lymphatic: Calcific atherosclerosis. No enlarged abdominal or pelvic lymph nodes. Reproductive: No mass or other abnormality. Calcified uterine fibroids. Other: No abdominal wall hernia or abnormality. No abdominopelvic ascites. Musculoskeletal: No acute or significant osseous findings. IMPRESSION: 1. There is a 2 mm nonobstructive calculus of the inferior pole of the left kidney. There is no right-sided renal or ureteral calculus. There is mild fat stranding about the right ureter. No hydronephrosis. There is a 2 mm calculus in the dependent urinary bladder near the left ureterovesicular junction (series 3, image 67). Findings may reflect a recently passed right-sided urinary tract calculus given reported symptoms. 2. Large gallstone near the gallbladder neck. No gallbladder wall thickening, pericholecystic fluid, or biliary ductal dilatation. 3.  Other chronic and incidental findings as detailed above. Electronically Signed    By: Eddie Candle M.D.   On: 03/21/2019 13:00    Procedures Procedures (including critical care time)  Medications Ordered in ED Medications - No data to display   Initial Impression / Assessment and Plan / ED Course  I have reviewed the triage vital signs and the nursing notes.  Pertinent labs & imaging results that were available during my care of the patient were reviewed by me and considered in my medical decision making (see chart for details).  Iv ns. Lab sent.   Stat ct.   Reviewed nursing notes and prior charts for additional history.   Labs reviewed by me - chem normal.   CT reviewed by me - gallstone and recently passed kidney stone in bladder, also left renal stone.   Recheck pt - pain remains resolved. abd soft nt. Afebrile. Patient requests d/c to home.  Discussed imaging studies w pt.   Pt remains symptom free and continues to request d/c. Pt appears stable for d/c.    Final Clinical Impressions(s) / ED Diagnoses   Final diagnoses:  None    ED Discharge Orders    None       Lajean Saver, MD 03/21/19 [phone]

## 2019-03-21 NOTE — ED Triage Notes (Addendum)
PT CO abd pain that started at 1000. She reports no pain at this time. The pain started on her right side above the the waist and radiated forward to her abd. Pt states that it made her vomit.

## 2019-04-07 ENCOUNTER — Other Ambulatory Visit: Payer: Self-pay | Admitting: *Deleted

## 2019-04-07 DIAGNOSIS — G894 Chronic pain syndrome: Secondary | ICD-10-CM

## 2019-04-07 DIAGNOSIS — L4052 Psoriatic arthritis mutilans: Secondary | ICD-10-CM

## 2019-04-07 MED ORDER — HYDROCODONE-ACETAMINOPHEN 5-325 MG PO TABS: 1.0000 | ORAL_TABLET | Freq: Every day | ORAL | 0 refills | Status: DC

## 2019-04-07 NOTE — Telephone Encounter (Signed)
Patient requested refill Patient has an appointment on 05/16/2019 with [facility] Database Verified LR: 02/08/2019 Pended Appointment and sent to [facility] for approval.

## 2019-05-02 ENCOUNTER — Encounter: Payer: Self-pay | Admitting: Family

## 2019-05-02 ENCOUNTER — Ambulatory Visit (INDEPENDENT_AMBULATORY_CARE_PROVIDER_SITE_OTHER): Payer: Medicare Other | Admitting: Family

## 2019-05-02 ENCOUNTER — Ambulatory Visit: Payer: Self-pay

## 2019-05-02 ENCOUNTER — Other Ambulatory Visit: Payer: Medicare Other

## 2019-05-02 ENCOUNTER — Other Ambulatory Visit: Payer: Self-pay

## 2019-05-02 VITALS — BP 120/80 | HR 88 | Temp 98.0°F | Ht 67.0 in | Wt 154.2 lb

## 2019-05-02 DIAGNOSIS — Z23 Encounter for immunization: Secondary | ICD-10-CM | POA: Diagnosis not present

## 2019-05-02 DIAGNOSIS — Z0000 Encounter for general adult medical examination without abnormal findings: Secondary | ICD-10-CM | POA: Diagnosis not present

## 2019-05-02 DIAGNOSIS — E782 Mixed hyperlipidemia: Secondary | ICD-10-CM | POA: Diagnosis not present

## 2019-05-02 DIAGNOSIS — Z79899 Other long term (current) drug therapy: Secondary | ICD-10-CM | POA: Diagnosis not present

## 2019-05-02 DIAGNOSIS — E034 Atrophy of thyroid (acquired): Secondary | ICD-10-CM

## 2019-05-02 MED ORDER — LEVOTHYROXINE SODIUM 88 MCG PO TABS: ORAL_TABLET | ORAL | 1 refills | Status: DC

## 2019-05-02 MED ORDER — PNEUMOVAX 23 25 MCG/0.5ML IJ INJ: 0.5000 mL | INJECTION | Freq: Once | INTRAMUSCULAR | 0 refills | Status: AC

## 2019-05-02 NOTE — Patient Instructions (Signed)
Sydney Reid , Thank you for taking time to come for your Medicare Wellness Visit. I appreciate your ongoing commitment to your health goals. Please review the following plan we discussed and let me know if I can assist you in the future.   Screening recommendations/referrals: Colonoscopy Up to date  Mammogram :Up to date  Bone Density Up to date  Recommended yearly ophthalmology/optometry visit for glaucoma screening and checkup Recommended yearly dental visit for hygiene and checkup  Vaccinations: Influenza vaccineUp to date  Pneumococcal vaccine: 2nd dose Ordered this visit  Tdap vaccine Up to date  Shingles vaccine Up to date   Advanced directives:yes   Conditions/risks identified: Advance age Female > 79 yrs,Hyperlipidemia,family hx of premature CVD,Hx smoking   Next appointment: 1 year   Preventive Care 80 Years and Older, Female Preventive care refers to lifestyle choices and visits with your health care provider that can promote health and wellness. What does preventive care include?  A yearly physical exam. This is also called an annual well check.  Dental exams once or twice a year.  Routine eye exams. Ask your health care provider how often you should have your eyes checked.  Personal lifestyle choices, including:  Daily care of your teeth and gums.  Regular physical activity.  Eating a healthy diet.  Avoiding tobacco and drug use.  Limiting alcohol use.  Practicing safe sex.  Taking low-dose aspirin every day.  Taking vitamin and mineral supplements as recommended by your health care provider. What happens during an annual well check? The services and screenings done by your health care provider during your annual well check will depend on your age, overall health, lifestyle risk factors, and family history of disease. Counseling  Your health care provider may ask you questions about your:  Alcohol use.  Tobacco use.  Drug use.  Emotional  well-being.  Home and relationship well-being.  Sexual activity.  Eating habits.  History of falls.  Memory and ability to understand (cognition).  Work and work Statistician.  Reproductive health. Screening  You may have the following tests or measurements:  Height, weight, and BMI.  Blood pressure.  Lipid and cholesterol levels. These may be checked every 5 years, or more frequently if you are over 50 years old.  Skin check.  Lung cancer screening. You may have this screening every year starting at age 24 if you have a 30-pack-year history of smoking and currently smoke or have quit within the past 15 years.  Fecal occult blood test (FOBT) of the stool. You may have this test every year starting at age 43.  Flexible sigmoidoscopy or colonoscopy. You may have a sigmoidoscopy every 5 years or a colonoscopy every 10 years starting at age 60.  Hepatitis C blood test.  Hepatitis B blood test.  Sexually transmitted disease (STD) testing.  Diabetes screening. This is done by checking your blood sugar (glucose) after you have not eaten for a while (fasting). You may have this done every 1-3 years.  Bone density scan. This is done to screen for osteoporosis. You may have this done starting at age 75.  Mammogram. This may be done every 1-2 years. Talk to your health care provider about how often you should have regular mammograms. Talk with your health care provider about your test results, treatment options, and if necessary, the need for more tests. Vaccines  Your health care provider may recommend certain vaccines, such as:  Influenza vaccine. This is recommended every year.  Tetanus, diphtheria,  and acellular pertussis (Tdap, Td) vaccine. You may need a Td booster every 10 years.  Zoster vaccine. You may need this after age 80.  Pneumococcal 13-valent conjugate (PCV13) vaccine. One dose is recommended after age 51.  Pneumococcal polysaccharide (PPSV23) vaccine. One  dose is recommended after age 72. Talk to your health care provider about which screenings and vaccines you need and how often you need them. This information is not intended to replace advice given to you by your health care provider. Make sure you discuss any questions you have with your health care provider. Document Released: 11/09/2015 Document Revised: 07/02/2016 Document Reviewed: 08/14/2015 Elsevier Interactive Patient Education  2017 [place] Prevention in the Home Falls can cause injuries. They can happen to people of all ages. There are many things you can do to make your home safe and to help prevent falls. What can I do on the outside of my home?  Regularly fix the edges of walkways and driveways and fix any cracks.  Remove anything that might make you trip as you walk through a door, such as a raised step or threshold.  Trim any bushes or trees on the path to your home.  Use bright outdoor lighting.  Clear any walking paths of anything that might make someone trip, such as rocks or tools.  Regularly check to see if handrails are loose or broken. Make sure that both sides of any steps have handrails.  Any raised decks and porches should have guardrails on the edges.  Have any leaves, snow, or ice cleared regularly.  Use sand or salt on walking paths during winter.  Clean up any spills in your garage right away. This includes oil or grease spills. What can I do in the bathroom?  Use night lights.  Install grab bars by the toilet and in the tub and shower. Do not use towel bars as grab bars.  Use non-skid mats or decals in the tub or shower.  If you need to sit down in the shower, use a plastic, non-slip stool.  Keep the floor dry. Clean up any water that spills on the floor as soon as it happens.  Remove soap buildup in the tub or shower regularly.  Attach bath mats securely with double-sided non-slip rug tape.  Do not have throw rugs and other  things on the floor that can make you trip. What can I do in the bedroom?  Use night lights.  Make sure that you have a light by your bed that is easy to reach.  Do not use any sheets or blankets that are too big for your bed. They should not hang down onto the floor.  Have a firm chair that has side arms. You can use this for support while you get dressed.  Do not have throw rugs and other things on the floor that can make you trip. What can I do in the kitchen?  Clean up any spills right away.  Avoid walking on wet floors.  Keep items that you use a lot in easy-to-reach places.  If you need to reach something above you, use a strong step stool that has a grab bar.  Keep electrical cords out of the way.  Do not use floor polish or wax that makes floors slippery. If you must use wax, use non-skid floor wax.  Do not have throw rugs and other things on the floor that can make you trip. What can I do with my  stairs?  Do not leave any items on the stairs.  Make sure that there are handrails on both sides of the stairs and use them. Fix handrails that are broken or loose. Make sure that handrails are as long as the stairways.  Check any carpeting to make sure that it is firmly attached to the stairs. Fix any carpet that is loose or worn.  Avoid having throw rugs at the top or bottom of the stairs. If you do have throw rugs, attach them to the floor with carpet tape.  Make sure that you have a light switch at the top of the stairs and the bottom of the stairs. If you do not have them, ask someone to add them for you. What else can I do to help prevent falls?  Wear shoes that:  Do not have high heels.  Have rubber bottoms.  Are comfortable and fit you well.  Are closed at the toe. Do not wear sandals.  If you use a stepladder:  Make sure that it is fully opened. Do not climb a closed stepladder.  Make sure that both sides of the stepladder are locked into place.  Ask  someone to hold it for you, if possible.  Clearly mark and make sure that you can see:  Any grab bars or handrails.  First and last steps.  Where the edge of each step is.  Use tools that help you move around (mobility aids) if they are needed. These include:  Canes.  Walkers.  Scooters.  Crutches.  Turn on the lights when you go into a dark area. Replace any light bulbs as soon as they burn out.  Set up your furniture so you have a clear path. Avoid moving your furniture around.  If any of your floors are uneven, fix them.  If there are any pets around you, be aware of where they are.  Review your medicines with your doctor. Some medicines can make you feel dizzy. This can increase your chance of falling. Ask your doctor what other things that you can do to help prevent falls. This information is not intended to replace advice given to you by your health care provider. Make sure you discuss any questions you have with your health care provider. Document Released: 08/09/2009 Document Revised: 03/20/2016 Document Reviewed: 11/17/2014 Elsevier Interactive Patient Education  2017 [employer].

## 2019-05-02 NOTE — Progress Notes (Signed)
Subjective:   Sydney Reid is a 78 y.o. female who presents for Medicare Annual (Subsequent) preventive examination.  Review of Systems:  Cardiac Risk Factors include: advanced age (>13mn, >>77women);dyslipidemia;family history of premature cardiovascular disease;smoking/ tobacco exposure     Objective:     Vitals: BP 120/80   Pulse 88   Temp 98 F (36.7 C)   Ht '5\' 7"'$  (1.702 m)   Wt 154 lb 3.2 oz (69.9 kg)   SpO2 97%   BMI 24.15 kg/m   Body mass index is 24.15 kg/m.  Advanced Directives 03/21/2019 04/30/2018 04/24/2017 04/09/2017 06/02/2016 05/02/2016  Does Patient Have a Medical Advance Directive? No Yes Yes Yes Yes Yes  Type of [place] Does patient want to make changes to medical advance directive? - No - Patient declined No - Patient declined No - Patient declined No - Patient declined No - Patient declined  Copy of [place]? - No - copy requested No - copy requested No - copy requested No - copy requested No - copy requested  Would patient like information on creating a medical advance directive? No - Patient declined - - - - -    Tobacco Social History   Tobacco Use  Smoking Status Former Smoker  . Packs/day: 2.00  . Years: 18.00  . Pack years: 36.00  . Types: Cigarettes  . Quit date: 14 . Years since quitting: 45.5  Smokeless Tobacco Never Used     Counseling given: Not Answered   Clinical Intake:  Pre-visit preparation completed: No  Pain : 0-10 Pain Score: 2  Pain Type: Chronic pain Pain Location: Other (Comment)(multiple sites arthritis) Pain Radiating Towards: no Pain Descriptors / Indicators: Aching Pain Onset: Other (comment)(since childhood) Pain Frequency: Constant Pain Relieving Factors: Hydrocodone at bedtime. Effect of Pain on Daily  Activities: cannot walk as she used to.  Pain Relieving Factors: Hydrocodone at bedtime.  BMI - recorded: 24.15 Nutritional Status: BMI of 19-24  Normal Nutritional Risks: None Diabetes: No  How often do you need to have someone help you when you read instructions, pamphlets, or other written materials from your doctor or pharmacy?: 1 - Never What is the last grade level you completed in school?: College  Interpreter Needed?: No  Information entered by :: Coden Franchi FNP-C  Past Medical History:  Diagnosis Date  . H/O total knee replacement 2001   Right  . High cholesterol   . History of tonsillectomy and adenoidectomy 1946  . [place]  . Sleep apnea with use of continuous positive airway pressure (CPAP) 2004  . Thyroid disease    Past Surgical History:  Procedure Laterality Date  . FOOT SURGERY     fused arch in left foot  . SPINE SURGERY    . TOTAL KNEE ARTHROPLASTY Left    Family History  Problem Relation Age of Onset  . Heart attack Father   . Heart disease Sister    Social History   Socioeconomic History  . Marital status: Married    Spouse name: Not on file  . Number of children: Not on file  . Years of education: Not on file  . Highest education level: Not on file  Occupational History  . Not on file  Social Needs  . Financial resource strain: Not hard at all  .  Food insecurity    Worry: Never true    Inability: Never true  . Transportation needs    Medical: No    Non-medical: No  Tobacco Use  . Smoking status: Former Smoker    Packs/day: 2.00    Years: 18.00    Pack years: 36.00    Types: Cigarettes    Quit date: 1975    Years since quitting: 45.5  . Smokeless tobacco: Never Used  Substance and Sexual Activity  . Alcohol use: Yes    Alcohol/week: 2.0 - 7.0 standard drinks    Types: 2 - 7 Standard drinks or equivalent per week    Comment: Social drinker, couple times a month  . Drug use: No  . Sexual activity: Not  on file  Lifestyle  . Physical activity    Days per week: 0 days    Minutes per session: 0 min  . Stress: Not at all  Relationships  . Social connections    Talks on phone: More than three times a week    Gets together: Three times a week    Attends religious service: More than 4 times per year    Active member of club or organization: Yes    Attends meetings of clubs or organizations: More than 4 times per year    Relationship status: Married  Other Topics Concern  . Not on file  Social History Narrative   Diet? Normal      Do you drink/eat things with caffeine? Yes, 1 mt. Dew per day      Marital status?              m                      What year were you married? 1988      Do you live in a house, apartment, assisted living, condo, trailer, etc.? Townhouse      Is it one or more stories? 2 story      How many persons live in your home? 2      Do you have any pets in your home? (please list) no      Current or past profession: Is manager      Do you exercise?     No (did it for 50 years)                       Type & how often?      Do you have a living will? yes      Do you have a DNR form?        yes                          If not, do you want to discuss one?      Do you have signed POA/HPOA for forms?        Outpatient Encounter Medications as of 05/02/2019  Medication Sig  . calcium carbonate (TUMS - DOSED IN MG ELEMENTAL CALCIUM) 500 MG chewable tablet Chew 1 tablet by mouth as needed for indigestion or heartburn.  . Cholecalciferol (VITAMIN D) 50 MCG (2000 UT) CAPS Take 2,000 Units by mouth daily.  Marland Kitchen etanercept (ENBREL) 50 MG/ML injection Inject 50 mg into the skin every Sunday.   Marland Kitchen HYDROcodone-acetaminophen (NORCO) 5-325 MG tablet Take 1-2 tablets by mouth at bedtime.  Marland Kitchen levothyroxine (SYNTHROID, LEVOTHROID) 88 MCG tablet Take  one tablet by mouth once daily 30 minutes before breakfast on an empty stomach for thyroid   No facility-administered encounter  medications on file as of 05/02/2019.     Activities of Daily Living In your present state of health, do you have any difficulty performing the following activities: 05/02/2019  Hearing? N  Vision? N  Difficulty concentrating or making decisions? N  Walking or climbing stairs? Y  Comment Has balance issues,dizziness  Dressing or bathing? Y  Comment unable to use slippers or buttons due to limitation of arthirits on hands  Doing errands, shopping? N  Preparing Food and eating ? N  Using the Toilet? N  In the past six months, have you accidently leaked urine? Y  Comment once  Do you have problems with loss of bowel control? N  Managing your Medications? N  Managing your Finances? N  Housekeeping or managing your Housekeeping? Y  Comment has a house cleaner  Some recent data might be hidden    Patient Care Team: [name], NP as PCP - General (Geriatric Medicine) Tomma Rakers, MD as Referring Physician (Ophthalmology)    Assessment:   This is a routine wellness examination for Sydney Reid.  Exercise Activities and Dietary recommendations Current Exercise Habits: Home exercise routine, Type of exercise: stretching, Time (Minutes): 15, Frequency (Times/Week): 3, Weekly Exercise (Minutes/Week): 45, Intensity: Mild, Exercise limited by: Other - see comments(arthritis of multiple sites)  Goals    . Pilaties and tai chi     Patient will start doing tai chi and pilates.       Fall Risk Fall Risk  05/02/2019 04/30/2018 12/25/2017 07/08/2017 04/24/2017  Falls in the past year? 0 Yes No No No  Number falls in past yr: 0 1 - - -  Injury with Fall? 0 Yes - - -  Comment - L shoulder - - -   Is the patient's home free of loose throw rugs in walkways, pet beds, electrical cords, etc?   no      Grab bars in the bathroom? no      Handrails on the stairs?   yes      Adequate lighting?   yes  Depression Screen PHQ 2/9 Scores 05/02/2019 04/30/2018 12/25/2017 04/24/2017  PHQ - 2 Score 0 0 0  0     Cognitive Function MMSE - Mini Mental State Exam 05/02/2019 04/30/2018 04/09/2017  Orientation to time '5 5 5  '$ Orientation to Place '5 5 5  '$ Registration '3 3 3  '$ Attention/ Calculation '5 5 5  '$ Recall '3 3 3  '$ Language- name 2 objects '2 2 2  '$ Language- repeat '1 1 1  '$ Language- follow 3 step command '3 3 3  '$ Language- read & follow direction '1 1 1  '$ Write a sentence '1 1 1  '$ Copy design '1 1 1  '$ Total score '30 30 30        '$ Immunization History  Administered Date(s) Administered  . Influenza,inj,Quad PF,6+ Mos 07/08/2017  . Influenza-Unspecified 09/13/2012, 11/09/2014  . Pneumococcal Conjugate-13 11/23/2014  . Pneumococcal Polysaccharide-23 10/27/2005  . Tdap 10/27/2009    Qualifies for Shingles Vaccine?11/2018 and 03/2019   Screening Tests Health Maintenance  Topic Date Due  . INFLUENZA VACCINE  05/28/2019  . TETANUS/TDAP  10/28/2019  . DEXA SCAN  Completed  . PNA vac Low Risk Adult  Completed    Cancer Screenings: Lung: Low Dose CT Chest recommended if Age 67-80 years, 30 pack-year currently smoking OR have quit w/in 15years. Patient  does not qualify. Breast:  Up to date on Mammogram? Yes   Up to date of Bone Density/Dexa? Yes Colorectal:Up to date   Additional Screenings: Hepatitis C Screening: low Risk   Plan:  - Pneumococcal  23 vaccine ordered   I have personally reviewed and noted the following in the patient's chart:   . Medical and social history . Use of alcohol, tobacco or illicit drugs  . Current medications and supplements . Functional ability and status . Nutritional status . Physical activity . Advanced directives . List of other physicians . Hospitalizations, surgeries, and ER visits in previous 12 months . Vitals . Screenings to include cognitive, depression, and falls . Referrals and appointments  In addition, I have reviewed and discussed with patient certain preventive protocols, quality metrics, and best practice recommendations. A written  personalized care plan for preventive services as well as general preventive health recommendations were provided to patient.  Sandrea Hughs, NP  05/02/2019

## 2019-05-03 LAB — URINALYSIS, ROUTINE W REFLEX MICROSCOPIC
Bacteria, UA: NONE SEEN /HPF
Bilirubin Urine: NEGATIVE
Glucose, UA: NEGATIVE
Hgb urine dipstick: NEGATIVE
Hyaline Cast: NONE SEEN /LPF
Ketones, ur: NEGATIVE
Nitrite: NEGATIVE
Protein, ur: NEGATIVE
Specific Gravity, Urine: 1.012 (ref 1.001–1.03)
pH: <=5 (ref 5.0–8.0)

## 2019-05-03 LAB — COMPLETE METABOLIC PANEL WITH GFR
AG Ratio: 1.3 (calc) (ref 1.0–2.5)
ALT: 13 U/L (ref 6–29)
AST: 14 U/L (ref 10–35)
Albumin: 4.3 g/dL (ref 3.6–5.1)
Alkaline phosphatase (APISO): 65 U/L (ref 37–153)
BUN/Creatinine Ratio: 16 (calc) (ref 6–22)
BUN: 17 mg/dL (ref 7–25)
CO2: 24 mmol/L (ref 20–32)
Calcium: 9.5 mg/dL (ref 8.6–10.4)
Chloride: 109 mmol/L (ref 98–110)
Creat: 1.07 mg/dL — ABNORMAL HIGH (ref 0.60–0.93)
GFR, Est African American: 57 mL/min/{1.73_m2} — ABNORMAL LOW (ref >=60)
GFR, Est Non African American: 49 mL/min/{1.73_m2} — ABNORMAL LOW (ref >=60)
Globulin: 3.2 g/dL (calc) (ref 1.9–3.7)
Glucose, Bld: 101 mg/dL — ABNORMAL HIGH (ref 65–99)
Potassium: 4.4 mmol/L (ref 3.5–5.3)
Sodium: 141 mmol/L (ref 135–146)
Total Bilirubin: 0.6 mg/dL (ref 0.2–1.2)
Total Protein: 7.5 g/dL (ref 6.1–8.1)

## 2019-05-03 LAB — CBC WITH DIFFERENTIAL/PLATELET
Absolute Monocytes: 562 cells/uL (ref 200–950)
Basophils Absolute: 11 cells/uL (ref 0–200)
Basophils Relative: 0.2 %
Eosinophils Absolute: 0 cells/uL — ABNORMAL LOW (ref 15–500)
Eosinophils Relative: 0 %
HCT: 47.3 % — ABNORMAL HIGH (ref 35.0–45.0)
Hemoglobin: 15.3 g/dL (ref 11.7–15.5)
Lymphs Abs: 2381 cells/uL (ref 850–3900)
MCH: 26.5 pg — ABNORMAL LOW (ref 27.0–33.0)
MCHC: 32.3 g/dL (ref 32.0–36.0)
MCV: 81.8 fL (ref 80.0–100.0)
MPV: 11 fL (ref 7.5–12.5)
Monocytes Relative: 10.4 %
Neutro Abs: 2446 cells/uL (ref 1500–7800)
Neutrophils Relative %: 45.3 %
Platelets: 228 10*3/uL (ref 140–400)
RBC: 5.78 10*6/uL — ABNORMAL HIGH (ref 3.80–5.10)
RDW: 14.1 % (ref 11.0–15.0)
Total Lymphocyte: 44.1 %
WBC: 5.4 10*3/uL (ref 3.8–10.8)

## 2019-05-03 LAB — LIPID PANEL
Cholesterol: 230 mg/dL — ABNORMAL HIGH (ref <200)
HDL: 53 mg/dL (ref >=50)
LDL Cholesterol (Calc): 152 mg/dL (calc) — ABNORMAL HIGH
Non-HDL Cholesterol (Calc): 177 mg/dL (calc) — ABNORMAL HIGH (ref <130)
Total CHOL/HDL Ratio: 4.3 (calc) (ref <5.0)
Triglycerides: 129 mg/dL (ref <150)

## 2019-05-03 LAB — T4, FREE: Free T4: 1.3 ng/dL (ref 0.8–1.8)

## 2019-05-03 LAB — TSH: TSH: 0.62 mIU/L (ref 0.40–4.50)

## 2019-05-04 ENCOUNTER — Other Ambulatory Visit: Payer: Medicare Other

## 2019-05-04 DIAGNOSIS — M79675 Pain in left toe(s): Secondary | ICD-10-CM | POA: Diagnosis not present

## 2019-05-04 DIAGNOSIS — I70203 Unspecified atherosclerosis of native arteries of extremities, bilateral legs: Secondary | ICD-10-CM | POA: Diagnosis not present

## 2019-05-11 DIAGNOSIS — L409 Psoriasis, unspecified: Secondary | ICD-10-CM | POA: Diagnosis not present

## 2019-05-11 DIAGNOSIS — M1990 Unspecified osteoarthritis, unspecified site: Secondary | ICD-10-CM | POA: Diagnosis not present

## 2019-05-11 DIAGNOSIS — M545 Low back pain: Secondary | ICD-10-CM | POA: Diagnosis not present

## 2019-05-11 DIAGNOSIS — Z79899 Other long term (current) drug therapy: Secondary | ICD-10-CM | POA: Diagnosis not present

## 2019-05-11 DIAGNOSIS — M2550 Pain in unspecified joint: Secondary | ICD-10-CM | POA: Diagnosis not present

## 2019-05-11 DIAGNOSIS — L4050 Arthropathic psoriasis, unspecified: Secondary | ICD-10-CM | POA: Diagnosis not present

## 2019-05-11 DIAGNOSIS — M8580 Other specified disorders of bone density and structure, unspecified site: Secondary | ICD-10-CM | POA: Diagnosis not present

## 2019-05-12 ENCOUNTER — Encounter: Payer: Self-pay | Admitting: Nurse Practitioner

## 2019-05-16 ENCOUNTER — Encounter: Payer: Self-pay | Admitting: Nurse Practitioner

## 2019-05-16 ENCOUNTER — Other Ambulatory Visit: Payer: Self-pay

## 2019-05-16 ENCOUNTER — Ambulatory Visit (INDEPENDENT_AMBULATORY_CARE_PROVIDER_SITE_OTHER): Payer: Medicare Other | Admitting: Nurse Practitioner

## 2019-05-16 VITALS — BP 124/82 | HR 89 | Temp 98.3°F | Ht 67.0 in | Wt 154.4 lb

## 2019-05-16 DIAGNOSIS — E782 Mixed hyperlipidemia: Secondary | ICD-10-CM

## 2019-05-16 DIAGNOSIS — M8580 Other specified disorders of bone density and structure, unspecified site: Secondary | ICD-10-CM

## 2019-05-16 DIAGNOSIS — E559 Vitamin D deficiency, unspecified: Secondary | ICD-10-CM

## 2019-05-16 DIAGNOSIS — L4052 Psoriatic arthritis mutilans: Secondary | ICD-10-CM

## 2019-05-16 DIAGNOSIS — R42 Dizziness and giddiness: Secondary | ICD-10-CM

## 2019-05-16 DIAGNOSIS — G894 Chronic pain syndrome: Secondary | ICD-10-CM

## 2019-05-16 DIAGNOSIS — E034 Atrophy of thyroid (acquired): Secondary | ICD-10-CM

## 2019-05-16 MED ORDER — EZETIMIBE 10 MG PO TABS: 10.0000 mg | ORAL_TABLET | Freq: Every day | ORAL | 1 refills | Status: DC

## 2019-05-16 NOTE — Patient Instructions (Signed)
Heart-Healthy Eating Plan °Many factors influence your heart (coronary) health, including eating and exercise habits. Coronary risk increases with abnormal blood fat (lipid) levels. Heart-healthy meal planning includes limiting unhealthy fats, increasing healthy fats, and making other diet and lifestyle changes. ° °What are tips for following this plan? °Cooking °Cook foods using methods other than frying. Baking, boiling, grilling, and broiling are all good options. Other ways to reduce fat include: °· Removing the skin from poultry. °· Removing all visible fats from meats. °· Steaming vegetables in water or broth. °Meal planning ° °· At meals, imagine dividing your plate into fourths: °? Fill one-half of your plate with vegetables and green salads. °? Fill one-fourth of your plate with whole grains. °? Fill one-fourth of your plate with lean protein foods. °· Eat 4-5 servings of vegetables per day. One serving equals 1 cup raw or cooked vegetable, or 2 cups raw leafy greens. °· Eat 4-5 servings of fruit per day. One serving equals 1 medium whole fruit, ¼ cup dried fruit, ½ cup fresh, frozen, or canned fruit, or ½ cup 100% fruit juice. °· Eat more foods that contain soluble fiber. Examples include apples, broccoli, carrots, beans, peas, and barley. Aim to get 25-30 g of fiber per day. °· Increase your consumption of legumes, nuts, and seeds to 4-5 servings per week. One serving of dried beans or legumes equals ½ cup cooked, 1 serving of nuts is ¼ cup, and 1 serving of seeds equals 1 tablespoon. °Fats °· Choose healthy fats more often. Choose monounsaturated and polyunsaturated fats, such as olive and canola oils, flaxseeds, walnuts, almonds, and seeds. °· Eat more omega-3 fats. Choose salmon, mackerel, sardines, tuna, flaxseed oil, and ground flaxseeds. Aim to eat fish at least 2 times each week. °· Check food labels carefully to identify foods with trans fats or high amounts of saturated fat. °· Limit saturated  fats. These are found in animal products, such as meats, butter, and cream. Plant sources of saturated fats include palm oil, palm kernel oil, and coconut oil. °· Avoid foods with partially hydrogenated oils in them. These contain trans fats. Examples are stick margarine, some tub margarines, cookies, crackers, and other baked goods. °· Avoid fried foods. °General information °· Eat more home-cooked food and less restaurant, buffet, and fast food. °· Limit or avoid alcohol. °· Limit foods that are high in starch and sugar. °· Lose weight if you are overweight. Losing just 5-10% of your body weight can help your overall health and prevent diseases such as diabetes and heart disease. °· Monitor your salt (sodium) intake, especially if you have high blood pressure. Talk with your health care provider about your sodium intake. °· Try to incorporate more vegetarian meals weekly. °What foods can I eat? °Fruits °All fresh, canned (in natural juice), or frozen fruits. °Vegetables °Fresh or frozen vegetables (raw, steamed, roasted, or grilled). Green salads. °Grains °Most grains. Choose whole wheat and whole grains most of the time. Rice and pasta, including brown rice and pastas made with whole wheat. °Meats and other proteins °Lean, well-trimmed beef, veal, pork, and lamb. Chicken and turkey without skin. All fish and shellfish. Wild duck, rabbit, pheasant, and venison. Egg whites or low-cholesterol egg substitutes. Dried beans, peas, lentils, and tofu. Seeds and most nuts. °Dairy °Low-fat or nonfat cheeses, including ricotta and mozzarella. Skim or 1% milk (liquid, powdered, or evaporated). Buttermilk made with low-fat milk. Nonfat or low-fat yogurt. °Fats and oils °Non-hydrogenated (trans-free) margarines. Vegetable oils, including soybean, sesame,   sunflower, olive, peanut, safflower, corn, canola, and cottonseed. Salad dressings or mayonnaise made with a vegetable oil. °Beverages °Water (mineral or sparkling). Coffee  and tea. Diet carbonated beverages. °Sweets and desserts °Sherbet, gelatin, and fruit ice. Small amounts of dark chocolate. °Limit all sweets and desserts. °Seasonings and condiments °All seasonings and condiments. °The items listed above may not be a complete list of foods and beverages you can eat. Contact a dietitian for more options. °What foods are not recommended? °Fruits °Canned fruit in heavy syrup. Fruit in cream or butter sauce. Fried fruit. Limit coconut. °Vegetables °Vegetables cooked in cheese, cream, or butter sauce. Fried vegetables. °Grains °Breads made with saturated or trans fats, oils, or whole milk. Croissants. Sweet rolls. Donuts. High-fat crackers, such as cheese crackers. °Meats and other proteins °Fatty meats, such as hot dogs, ribs, sausage, bacon, rib-eye roast or steak. High-fat deli meats, such as salami and bologna. Caviar. Domestic duck and goose. Organ meats, such as liver. °Dairy °Cream, sour cream, cream cheese, and creamed cottage cheese. Whole milk cheeses. Whole or 2% milk (liquid, evaporated, or condensed). Whole buttermilk. Cream sauce or high-fat cheese sauce. Whole-milk yogurt. °Fats and oils °Meat fat, or shortening. Cocoa butter, hydrogenated oils, palm oil, coconut oil, palm kernel oil. Solid fats and shortenings, including bacon fat, salt pork, lard, and butter. Nondairy cream substitutes. Salad dressings with cheese or sour cream. °Beverages °Regular sodas and any drinks with added sugar. °Sweets and desserts °Frosting. Pudding. Cookies. Cakes. Pies. Milk chocolate or white chocolate. Buttered syrups. Full-fat ice cream or ice cream drinks. °The items listed above may not be a complete list of foods and beverages to avoid. Contact a dietitian for more information. °Summary °· Heart-healthy meal planning includes limiting unhealthy fats, increasing healthy fats, and making other diet and lifestyle changes. °· Lose weight if you are overweight. Losing just 5-10% of your  body weight can help your overall health and prevent diseases such as diabetes and heart disease. °· Focus on eating a balance of foods, including fruits and vegetables, low-fat or nonfat dairy, lean protein, nuts and legumes, whole grains, and heart-healthy oils and fats. °This information is not intended to replace advice given to you by your health care provider. Make sure you discuss any questions you have with your health care provider. °Document Released: 07/22/2008 Document Revised: 11/20/2017 Document Reviewed: 11/20/2017 °[employer]vier Patient Education © 2020 [employer] ° °

## 2019-05-16 NOTE — Progress Notes (Signed)
Careteam: Patient Care Team: Sydney Chandler, NP as PCP - General (Geriatric Medicine) Sydney Rakers, MD as Referring Physician (Ophthalmology)  Advanced Directive information Does Patient Have a Medical Advance Directive?: Yes, Type of Advance Directive: Healthcare Power of Attorney  Allergies  Allergen Reactions  . Codeine Nausea And Vomiting    Weakness and dysequilibrium  . Statins Hives    Myalgias     Chief Complaint  Patient presents with  . Medical Management of Chronic Issues    Extended visit, last EKG 04/2018     HPI: Patient is a 78 y.o. female seen in the office today for follow up.   Tripped and feel in her garage and landed on her elbows and knees Left humerus fracture in April 2019, followed with ortho and PT through September of 2019, still having some issues regarding this.   AWV completed 05/02/2019. MMSE  30/30.   Hyperlipidemia - reports hives/myalgias with statins. LDL trending up, reports she has been really bad with diet. Not surprised it as not good. Eating more desserts and wine. Started red yeast rice since she had blood work done. Not making dietary modifications.  Father died of an MI at 13. Sister has heart disease and hx had MI.  She has followed with a cardiologist in the past. Reports "no one is worried about her heart"   Hypothyroidism - stable on levothroid. TSH 0.62  OSA - dx in 2004. Last sleep study in 2004. She uses BiPAP 12/10 setting qHS. She is now seeing Dr Sydney Reid for sleep medicine- has not seen in a while due to COVID.   Allergic urticaria/dermatitis/hx psoriasis - takes Enbrel injections and following with rheumatologist   Vitamin D deficiency - takes Vit D 2000 units daily  Osteopenia - last DXA in aug 2019 continues on Vit D daily   Psoriatic arthritis - takes weekly enbrel injections; She has severe hand/finger joint deformities. Generalized joint pain. Takes norco prn. Was going to a pain clinic but they  went out of business so [facility] took over. Will take hydrocodone-apap 1 tablet at night when she goes to bed, 1 tablet. Otherwise she is in a lot of pain and can not sleep. Deals with pain during the day. Occasionally she will have to get up in the middle of the night to take a second dose. Followed by rheumatology Dr Sydney Reid at [facility].   Review of Systems:  Review of Systems  Constitutional: Negative for chills, fever and weight loss.  HENT: Positive for tinnitus.   Respiratory: Negative for cough, sputum production and shortness of breath.   Cardiovascular: Positive for palpitations (chronic and stable, wore cardiac monitor in the past). Negative for chest pain and leg swelling.  Gastrointestinal: Negative for abdominal pain, constipation, diarrhea and heartburn.  Genitourinary: Negative for dysuria, frequency and urgency.  Musculoskeletal: Positive for back pain, falls and joint pain. Negative for myalgias.       Chronic pain in legs, back, shoulder and feet.   Skin: Negative.   Neurological: Positive for dizziness (chronic). Negative for weakness and headaches.  Psychiatric/Behavioral: Negative for depression and memory loss. The patient does not have insomnia.     Past Medical History:  Diagnosis Date  . H/O total knee replacement 2001   Right  . High cholesterol   . History of tonsillectomy and adenoidectomy 1946  . Psoriatic arthritis ([place]) 1990  . Sleep apnea with use of continuous positive airway pressure (CPAP) 2004  .  Thyroid disease    Past Surgical History:  Procedure Laterality Date  . FOOT SURGERY     fused arch in left foot  . SPINE SURGERY    . TOTAL KNEE ARTHROPLASTY Left    Social History:   reports that she quit smoking about 45 years ago. Her smoking use included cigarettes. She has a 36.00 pack-year smoking history. She has never used smokeless tobacco. She reports current alcohol use of about 2.0 - 7.0 standard drinks of alcohol per week.  She reports that she does not use drugs.  Family History  Problem Relation Age of Onset  . Heart attack Father   . Heart disease Sister     Medications: Patient's Medications  New Prescriptions   No medications on file  Previous Medications   CALCIUM CARBONATE (TUMS - DOSED IN MG ELEMENTAL CALCIUM) 500 MG CHEWABLE TABLET    Chew 1 tablet by mouth as needed for indigestion or heartburn.   CHOLECALCIFEROL (VITAMIN D) 50 MCG (2000 UT) CAPS    Take 2,000 Units by mouth daily.   ETANERCEPT (ENBREL) 50 MG/ML INJECTION    Inject 50 mg into the skin every Sunday.    HYDROCODONE-ACETAMINOPHEN (NORCO) 5-325 MG TABLET    Take 1-2 tablets by mouth at bedtime.   LEVOTHYROXINE (SYNTHROID) 88 MCG TABLET    Take one tablet by mouth once daily 30 minutes before breakfast on an empty stomach for thyroid   RED YEAST RICE EXTRACT (RED YEAST RICE PO)    Take 2 tablets by mouth daily.  Modified Medications   No medications on file  Discontinued Medications   No medications on file    Physical Exam:  Vitals:   05/16/19 1335 05/16/19 1400  BP: (!) 144/88 124/82  Pulse: 89   Temp: 98.3 F (36.8 C)   TempSrc: Oral   SpO2: 97%   Weight: 154 lb 6.4 oz (70 kg)   Height: 5\' 7"  (1.702 m)    Body mass index is 24.18 kg/m. Wt Readings from Last 3 Encounters:  05/16/19 154 lb 6.4 oz (70 kg)  05/02/19 154 lb 3.2 oz (69.9 kg)  05/11/18 158 lb (71.7 kg)    Physical Exam Constitutional:      Appearance: Normal appearance. She is well-developed.  HENT:     Mouth/Throat:     Pharynx: No oropharyngeal exudate.  Eyes:     General: No scleral icterus.    Pupils: Pupils are equal, round, and reactive to light.  Neck:     Musculoskeletal: Neck supple.     Thyroid: No thyromegaly.     Trachea: No tracheal deviation.  Cardiovascular:     Rate and Rhythm: Normal rate and regular rhythm.     Heart sounds: Murmur (2/6 SEM) present. No friction rub. No gallop.      Comments: No LE edema b/l. no calf TTP.   Pulmonary:     Effort: Pulmonary effort is normal. No respiratory distress.     Breath sounds: Normal breath sounds. No stridor. No wheezing or rales.  Abdominal:     General: Bowel sounds are normal. There is no distension.     Palpations: Abdomen is soft. Abdomen is not rigid. There is no hepatomegaly or mass.     Tenderness: There is no abdominal tenderness. There is no guarding or rebound.     Hernia: No hernia is present.  Musculoskeletal:        General: Deformity (fingers; thoracic kyphosis) present.  Lymphadenopathy:  Cervical: No cervical adenopathy.  Skin:    General: Skin is warm and dry.     Findings: No rash.  Neurological:     Mental Status: She is alert and oriented to person, place, and time.     Deep Tendon Reflexes: Reflexes are normal and symmetric.  Psychiatric:        Behavior: Behavior normal.        Thought Content: Thought content normal.        Judgment: Judgment normal.     Labs reviewed: Basic Metabolic Panel: Recent Labs    03/21/19 1330 05/02/19 0901  NA 141 141  K 4.5 4.4  CL 106 109  CO2 20* 24  GLUCOSE 148* 101*  BUN 18 17  CREATININE 1.12* 1.07*  CALCIUM 9.5 9.5  TSH  --  0.62   Liver Function Tests: Recent Labs    03/21/19 1330 05/02/19 0901  AST 24 14  ALT 15 13  ALKPHOS 68  --   BILITOT 1.3* 0.6  PROT 7.7 7.5  ALBUMIN 4.1  --    Recent Labs    03/21/19 1330  LIPASE 34   No results for input(s): AMMONIA in the last 8760 hours. CBC: Recent Labs    03/21/19 1330 05/02/19 0901  WBC 7.0 5.4  NEUTROABS  --  2,446  HGB 15.7* 15.3  HCT 51.4* 47.3*  MCV 85.1 81.8  PLT 208 228   Lipid Panel: Recent Labs    05/02/19 0901  CHOL 230*  HDL 53  LDLCALC 152*  TRIG 129  CHOLHDL 4.3   TSH: Recent Labs    05/02/19 0901  TSH 0.62   A1C: No results found for: HGBA1C   Assessment/Plan 1. Hypothyroidism due to acquired atrophy of thyroid -TSH at goal on synthroid 88 mcg  2. Chronic pain syndrome Stable,  continues on hydrocodone-apap at bedtime due to increase in pain in the PM causing her to not be able to sleep. No side effects noted. Will continue current  3. Mixed hyperlipidemia -pt with high risk for CAD and cardiovascular event with family and personal hx, she is having a hard time with dietary modifications but encouraged her to continue to work on these, will add zetia 10 mg daily to regimen for goal LDL <100. - ezetimibe (ZETIA) 10 MG tablet; Take 1 tablet (10 mg total) by mouth daily.  Dispense: 90 tablet; Refill: 1 - Lipid panel; Future - COMPLETE METABOLIC PANEL WITH GFR; Future  4. Psoriatic arthritis, 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HCC) Ongoing, stable at this time, following with rheumatology   5. Dizziness Ongoing and stable.  6. Vitamin D deficiency Continues on supplement. - Vitamin D, 25-hydroxy; Future  7. Osteopenia  -taking vit d but not on calcium. Encouraged cal with vit d and weight bearing activity  Next appt: 3 months with labs prior to follow up cholesterol  Sydney Reid K. [place], [facility] [phone]

## 2019-06-08 ENCOUNTER — Other Ambulatory Visit: Payer: Self-pay | Admitting: *Deleted

## 2019-06-08 DIAGNOSIS — L4052 Psoriatic arthritis mutilans: Secondary | ICD-10-CM

## 2019-06-08 DIAGNOSIS — G894 Chronic pain syndrome: Secondary | ICD-10-CM

## 2019-06-08 MED ORDER — HYDROCODONE-ACETAMINOPHEN 5-325 MG PO TABS: 1.0000 | ORAL_TABLET | Freq: Every day | ORAL | 0 refills | Status: DC

## 2019-06-08 NOTE — Telephone Encounter (Signed)
Patient requested refill Pended Rx and sent to [facility] for approval Epic LR: 04/07/19

## 2019-06-14 ENCOUNTER — Other Ambulatory Visit: Payer: Self-pay

## 2019-06-14 DIAGNOSIS — Z20822 Contact with and (suspected) exposure to covid-19: Secondary | ICD-10-CM

## 2019-06-14 DIAGNOSIS — R6889 Other general symptoms and signs: Secondary | ICD-10-CM | POA: Diagnosis not present

## 2019-06-15 LAB — NOVEL CORONAVIRUS, NAA: SARS-CoV-2, NAA: NOT DETECTED

## 2019-07-22 DIAGNOSIS — Z23 Encounter for immunization: Secondary | ICD-10-CM | POA: Diagnosis not present

## 2019-08-05 DIAGNOSIS — B351 Tinea unguium: Secondary | ICD-10-CM | POA: Diagnosis not present

## 2019-08-05 DIAGNOSIS — I70203 Unspecified atherosclerosis of native arteries of extremities, bilateral legs: Secondary | ICD-10-CM | POA: Diagnosis not present

## 2019-08-10 ENCOUNTER — Other Ambulatory Visit: Payer: Self-pay

## 2019-08-10 DIAGNOSIS — L4052 Psoriatic arthritis mutilans: Secondary | ICD-10-CM

## 2019-08-10 DIAGNOSIS — G894 Chronic pain syndrome: Secondary | ICD-10-CM

## 2019-08-10 MED ORDER — HYDROCODONE-ACETAMINOPHEN 5-325 MG PO TABS: 1.0000 | ORAL_TABLET | Freq: Every day | ORAL | 0 refills | Status: DC

## 2019-08-10 NOTE — Telephone Encounter (Signed)
Patient called requesting refill on Hydrocodone. RX was last filled in epic on 06/08/2019

## 2019-08-12 ENCOUNTER — Other Ambulatory Visit: Payer: Self-pay

## 2019-08-12 ENCOUNTER — Other Ambulatory Visit: Payer: Medicare Other

## 2019-08-12 DIAGNOSIS — E559 Vitamin D deficiency, unspecified: Secondary | ICD-10-CM | POA: Diagnosis not present

## 2019-08-12 DIAGNOSIS — E782 Mixed hyperlipidemia: Secondary | ICD-10-CM | POA: Diagnosis not present

## 2019-08-13 LAB — COMPLETE METABOLIC PANEL WITH GFR
AG Ratio: 1.4 (calc) (ref 1.0–2.5)
ALT: 13 U/L (ref 6–29)
AST: 17 U/L (ref 10–35)
Albumin: 4.3 g/dL (ref 3.6–5.1)
Alkaline phosphatase (APISO): 58 U/L (ref 37–153)
BUN/Creatinine Ratio: 22 (calc) (ref 6–22)
BUN: 25 mg/dL (ref 7–25)
CO2: 27 mmol/L (ref 20–32)
Calcium: 9.6 mg/dL (ref 8.6–10.4)
Chloride: 106 mmol/L (ref 98–110)
Creat: 1.12 mg/dL — ABNORMAL HIGH (ref 0.60–0.93)
GFR, Est African American: 54 mL/min/{1.73_m2} — ABNORMAL LOW (ref >=60)
GFR, Est Non African American: 47 mL/min/{1.73_m2} — ABNORMAL LOW (ref >=60)
Globulin: 3.1 g/dL (calc) (ref 1.9–3.7)
Glucose, Bld: 94 mg/dL (ref 65–99)
Potassium: 4.4 mmol/L (ref 3.5–5.3)
Sodium: 142 mmol/L (ref 135–146)
Total Bilirubin: 0.5 mg/dL (ref 0.2–1.2)
Total Protein: 7.4 g/dL (ref 6.1–8.1)

## 2019-08-13 LAB — LIPID PANEL
Cholesterol: 196 mg/dL (ref <200)
HDL: 51 mg/dL (ref >=50)
LDL Cholesterol (Calc): 125 mg/dL (calc) — ABNORMAL HIGH
Non-HDL Cholesterol (Calc): 145 mg/dL (calc) — ABNORMAL HIGH (ref <130)
Total CHOL/HDL Ratio: 3.8 (calc) (ref <5.0)
Triglycerides: 95 mg/dL (ref <150)

## 2019-08-13 LAB — VITAMIN D 25 HYDROXY (VIT D DEFICIENCY, FRACTURES): Vit D, 25-Hydroxy: 46 ng/mL (ref 30–100)

## 2019-08-16 ENCOUNTER — Ambulatory Visit: Payer: Medicare Other | Admitting: Nurse Practitioner

## 2019-08-17 ENCOUNTER — Other Ambulatory Visit: Payer: Self-pay

## 2019-08-17 ENCOUNTER — Ambulatory Visit (INDEPENDENT_AMBULATORY_CARE_PROVIDER_SITE_OTHER): Payer: Medicare Other | Admitting: Nurse Practitioner

## 2019-08-17 ENCOUNTER — Encounter: Payer: Self-pay | Admitting: Nurse Practitioner

## 2019-08-17 VITALS — BP 102/70 | HR 92 | Temp 97.6°F | Resp 20 | Ht 67.0 in | Wt 153.6 lb

## 2019-08-17 DIAGNOSIS — M8580 Other specified disorders of bone density and structure, unspecified site: Secondary | ICD-10-CM

## 2019-08-17 DIAGNOSIS — E034 Atrophy of thyroid (acquired): Secondary | ICD-10-CM

## 2019-08-17 DIAGNOSIS — G4733 Obstructive sleep apnea (adult) (pediatric): Secondary | ICD-10-CM

## 2019-08-17 DIAGNOSIS — L4052 Psoriatic arthritis mutilans: Secondary | ICD-10-CM

## 2019-08-17 DIAGNOSIS — E782 Mixed hyperlipidemia: Secondary | ICD-10-CM | POA: Diagnosis not present

## 2019-08-17 DIAGNOSIS — Z9989 Dependence on other enabling machines and devices: Secondary | ICD-10-CM | POA: Diagnosis not present

## 2019-08-17 DIAGNOSIS — G894 Chronic pain syndrome: Secondary | ICD-10-CM | POA: Diagnosis not present

## 2019-08-17 NOTE — Progress Notes (Signed)
Careteam: Patient Care Team: [name], NP as PCP - General (Geriatric Medicine) Tomma Rakers, MD as Referring Physician (Ophthalmology)  Advanced Directive information Does Patient Have a Medical Advance Directive?: Yes(Copy requeted)  Allergies  Allergen Reactions  . Codeine Nausea And Vomiting    Weakness and dysequilibrium  . Statins Hives    Myalgias     Chief Complaint  Patient presents with  . Medical Management of Chronic Issues    3 Month Follow Up     HPI: Patient is a 78 y.o. female seen in the office today for 3 month follow up.   Hyperlipidemia- Cut out cheese, no other dietary modifications.Took Zetia for two days, stopped due to dizziness. Continues taking red yeast rice "when she remembers"  Hypothyroid- Levothyroxine 88 mcg, TSH stable in June.   OSA- Uses bipap every night while sleeping. No daytime drowsiness, increased fatigue, headaches. Follows with sleep medicine, hasn't had appt recently d/t Covid but able to adjust bipap if needed.    Osteopenia- Tries to remain active as able. Taking 4000 units daily, not taking Calcium supp.   Psoriatic arthritis- Followed by rheumatology, takes weekly enbrel injections.  Chronic pain-Takes norco nightly for chronic pain associated with psoriatic arthritis. Pain well controlled, able to sleep. Stays busy during day so able to ignore the pain easier than at night.    Review of Systems:  Review of Systems  Constitutional: Negative for chills, fever, malaise/fatigue and weight loss.  HENT: Negative for hearing loss.   Eyes: Negative.   Respiratory: Negative for cough and shortness of breath.   Cardiovascular: Positive for palpitations (Rare skipped beat, chronic). Negative for chest pain and leg swelling.  Gastrointestinal: Negative for abdominal pain, constipation, nausea and vomiting.  Genitourinary: Negative.   Musculoskeletal: Positive for joint pain (chronic-PA) and myalgias (chronic).   Skin: Negative for rash.  Neurological: Positive for dizziness (chronic). Negative for headaches.  Endo/Heme/Allergies: Does not bruise/bleed easily.  Psychiatric/Behavioral: Negative for depression. The patient is not nervous/anxious.     Past Medical History:  Diagnosis Date  . H/O total knee replacement 2001   Right  . High cholesterol   . History of tonsillectomy and adenoidectomy 1946  . Psoriatic arthritis ([place]) 1990  . Sleep apnea with use of continuous positive airway pressure (CPAP) 2004  . Thyroid disease    Past Surgical History:  Procedure Laterality Date  . FOOT SURGERY     fused arch in left foot  . SPINE SURGERY    . TOTAL KNEE ARTHROPLASTY Left    Social History:   reports that she quit smoking about 45 years ago. Her smoking use included cigarettes. She has a 36.00 pack-year smoking history. She has never used smokeless tobacco. She reports current alcohol use of about 2.0 - 7.0 standard drinks of alcohol per week. She reports that she does not use drugs.  Family History  Problem Relation Age of Onset  . Heart attack Father   . Heart disease Sister     Medications: Patient's Medications  New Prescriptions   No medications on file  Previous Medications   CALCIUM CARBONATE (TUMS - DOSED IN MG ELEMENTAL CALCIUM) 500 MG CHEWABLE TABLET    Chew 1 tablet by mouth as needed for indigestion or heartburn.   CHOLECALCIFEROL (VITAMIN D) 50 MCG (2000 UT) CAPS    Take 2,000 Units by mouth daily.   ETANERCEPT (ENBREL) 50 MG/ML INJECTION    Inject 50 mg into the skin every  Sunday.    EZETIMIBE (ZETIA) 10 MG TABLET    Take 1 tablet (10 mg total) by mouth daily.   HYDROCODONE-ACETAMINOPHEN (NORCO) 5-325 MG TABLET    Take 1-2 tablets by mouth at bedtime.   LEVOTHYROXINE (SYNTHROID) 88 MCG TABLET    Take one tablet by mouth once daily 30 minutes before breakfast on an empty stomach for thyroid   RED YEAST RICE EXTRACT (RED YEAST RICE PO)    Take 2 tablets by mouth daily.   Modified Medications   No medications on file  Discontinued Medications   No medications on file    Physical Exam:  Vitals:   08/17/19 1451  BP: 102/70  Pulse: 92  Resp: 20  Temp: 97.6 F (36.4 C)  TempSrc: Oral  SpO2: 95%  Weight: 153 lb 9.6 oz (69.7 kg)  Height: 5\' 7"  (1.702 m)   Body mass index is 24.06 kg/m. Wt Readings from Last 3 Encounters:  08/17/19 153 lb 9.6 oz (69.7 kg)  05/16/19 154 lb 6.4 oz (70 kg)  05/02/19 154 lb 3.2 oz (69.9 kg)    Physical Exam Constitutional:      Appearance: Normal appearance. She is well-developed.  Eyes:     General: Vision grossly intact.  Neck:     Musculoskeletal: Normal range of motion and neck supple.     Thyroid: No thyroid mass or thyromegaly.  Cardiovascular:     Rate and Rhythm: Normal rate and regular rhythm.     Pulses: Normal pulses.     Heart sounds: Normal heart sounds.  Pulmonary:     Effort: Pulmonary effort is normal.     Breath sounds: Normal breath sounds.  Abdominal:     General: Bowel sounds are normal.     Palpations: Abdomen is soft.  Musculoskeletal:        General: Deformity (PA-related joint deformity in fingers) present.     Right lower leg: No edema.     Left lower leg: No edema.  Lymphadenopathy:     Cervical: No cervical adenopathy.  Neurological:     Mental Status: She is alert and oriented to person, place, and time.  Psychiatric:        Mood and Affect: Mood normal.        Behavior: Behavior normal. Behavior is cooperative.     Labs reviewed: Basic Metabolic Panel: Recent Labs    03/21/19 1330 05/02/19 0901 08/12/19 0951  NA 141 141 142  K 4.5 4.4 4.4  CL 106 109 106  CO2 20* 24 27  GLUCOSE 148* 101* 94  BUN 18 17 25   CREATININE 1.12* 1.07* 1.12*  CALCIUM 9.5 9.5 9.6  TSH  --  0.62  --    Liver Function Tests: Recent Labs    03/21/19 1330 05/02/19 0901 08/12/19 0951  AST 24 14 17   ALT 15 13 13   ALKPHOS 68  --   --   BILITOT 1.3* 0.6 0.5  PROT 7.7 7.5 7.4   ALBUMIN 4.1  --   --    Recent Labs    03/21/19 1330  LIPASE 34   No results for input(s): AMMONIA in the last 8760 hours. CBC: Recent Labs    03/21/19 1330 05/02/19 0901  WBC 7.0 5.4  NEUTROABS  --  2,446  HGB 15.7* 15.3  HCT 51.4* 47.3*  MCV 85.1 81.8  PLT 208 228   Lipid Panel: Recent Labs    05/02/19 0901 08/12/19 0951  CHOL 230* 196  HDL  53 51  LDLCALC 152* 125*  TRIG 129 95  CHOLHDL 4.3 3.8   TSH: Recent Labs    05/02/19 0901  TSH 0.62   A1C: No results found for: HGBA1C   Assessment/Plan 1. Hypothyroidism due to acquired atrophy of thyroid -TSH stable in june, recheck with next follow up -Continue taking levothryroxine 88 mcg daily - TSH; Future  2. Psoriatic arthritis, 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[place]) -ongoing, Weekly enbrel injections, rheumatolgist requested she see dermatologist due to increased risk of melanoma associated with enbrel.  - Ambulatory referral to Dermatology - CBC with Differential/Platelet; Future  3. Chronic pain syndrome -stable. Psoriatic arthritis-related pain, well controlled with current meds -Able to stay busy during day and ignore pain but requires pain medication in order to sleep at night. -Continue norco 5-325 mg 1-2 tab at bedtime  4. Mixed hyperlipidemia -Improved since last visit: Chol 196 (08/12/19), 230 (05/02/19) LDL 125 (08/12/19), 152 (05/02/19) -Stopped taking Zetia after 2 days due to dizziness -Stopped eating cheese, discussed other sustainable dietary changes -Reviewed risks vs. benefits of different medication, pt opted to manage with lifestyle changes.  -continue dietary modifications - COMPLETE METABOLIC PANEL WITH GFR; Future - CBC with Differential/Platelet; Future  5. OSA on CPAP -Continue using bipap at night while sleeping -Follow up with sleep medicine as planned  6. Osteopenia, unspecified location -Physical activity, weight bearing activity as able -Continue taking vitamin D supp daily -  COMPLETE METABOLIC PANEL WITH GFR; Future - CBC with Differential/Platelet; Future   Next appt: 6 months with labs prior [place] K. Harle Battiest I personally was present during the history, physical exam and medical decision-making activities of this service and have verified that the service and findings are accurately documented in the student's note [facility] [phone]

## 2019-08-17 NOTE — Patient Instructions (Signed)
Keep up the good work  Follow up 6 months with labs before visit

## 2019-08-23 DIAGNOSIS — D1801 Hemangioma of skin and subcutaneous tissue: Secondary | ICD-10-CM | POA: Diagnosis not present

## 2019-08-23 DIAGNOSIS — L814 Other melanin hyperpigmentation: Secondary | ICD-10-CM | POA: Diagnosis not present

## 2019-08-23 DIAGNOSIS — L400 Psoriasis vulgaris: Secondary | ICD-10-CM | POA: Diagnosis not present

## 2019-08-23 DIAGNOSIS — D225 Melanocytic nevi of trunk: Secondary | ICD-10-CM | POA: Diagnosis not present

## 2019-08-23 DIAGNOSIS — L918 Other hypertrophic disorders of the skin: Secondary | ICD-10-CM | POA: Diagnosis not present

## 2019-08-23 DIAGNOSIS — L821 Other seborrheic keratosis: Secondary | ICD-10-CM | POA: Diagnosis not present

## 2019-09-26 ENCOUNTER — Other Ambulatory Visit: Payer: Self-pay

## 2019-09-26 DIAGNOSIS — Z20828 Contact with and (suspected) exposure to other viral communicable diseases: Secondary | ICD-10-CM | POA: Diagnosis not present

## 2019-09-26 DIAGNOSIS — Z20822 Contact with and (suspected) exposure to covid-19: Secondary | ICD-10-CM

## 2019-09-27 LAB — NOVEL CORONAVIRUS, NAA: SARS-CoV-2, NAA: NOT DETECTED

## 2019-10-10 ENCOUNTER — Other Ambulatory Visit: Payer: Self-pay | Admitting: *Deleted

## 2019-10-10 DIAGNOSIS — G894 Chronic pain syndrome: Secondary | ICD-10-CM

## 2019-10-10 DIAGNOSIS — L4052 Psoriatic arthritis mutilans: Secondary | ICD-10-CM

## 2019-10-10 MED ORDER — HYDROCODONE-ACETAMINOPHEN 5-325 MG PO TABS: 1.0000 | ORAL_TABLET | Freq: Every day | ORAL | 0 refills | Status: DC

## 2019-10-10 NOTE — Telephone Encounter (Signed)
Patient called and left message on clinical intake requesting refill for Hydrocodone.   Needs updated Narcotic Contract signed.  LMOM for patient to return call. Needs an appointment for Narcotic Contract. Awaiting callback.    [place] Verified LR: 08/10/2019

## 2019-10-10 NOTE — Telephone Encounter (Signed)
Patient scheduled an appointment for 10/17/2019

## 2019-10-17 ENCOUNTER — Other Ambulatory Visit: Payer: Self-pay

## 2019-10-17 ENCOUNTER — Encounter: Payer: Self-pay | Admitting: Nurse Practitioner

## 2019-10-17 ENCOUNTER — Ambulatory Visit (INDEPENDENT_AMBULATORY_CARE_PROVIDER_SITE_OTHER): Payer: Medicare Other | Admitting: Nurse Practitioner

## 2019-10-17 VITALS — BP 140/72 | HR 82 | Temp 98.2°F | Ht 67.0 in | Wt 156.0 lb

## 2019-10-17 DIAGNOSIS — E034 Atrophy of thyroid (acquired): Secondary | ICD-10-CM

## 2019-10-17 DIAGNOSIS — L4052 Psoriatic arthritis mutilans: Secondary | ICD-10-CM

## 2019-10-17 MED ORDER — LEVOTHYROXINE SODIUM 88 MCG PO TABS: ORAL_TABLET | ORAL | 1 refills | Status: DC

## 2019-10-17 NOTE — Patient Instructions (Signed)
You can take up to  3000 mg of Acetaminophen (tylenol) daily in 24 hours There is 325 mg of acetaminophen in your hydrocodone. If you get regular strength Acetaminophen 325 mg - you can take 2 in the morning and 2 at bedtime if needed in addition your night time hydrocodone.

## 2019-10-17 NOTE — Progress Notes (Signed)
+   Careteam: Patient Care Team: [name], NP as PCP - General (Geriatric Medicine) Tomma Rakers, MD as Referring Physician (Ophthalmology)  Advanced Directive information    Allergies  Allergen Reactions  . Codeine Nausea And Vomiting    Weakness and dysequilibrium  . Statins Hives    Myalgias     Chief Complaint  Patient presents with  . Medication Management    Review medications and update opioid treatment agreement   . Medication Refill    Patient request renewal of Synthroid x 1 year      HPI: Patient is a 78 y.o. female seen in the office today for pain management visit  Pt is on hydrocodone- apap which she takes for her severe psoriatic arthritis, destructive type. She has deformities to joints in her hands. She is generally good during the day because she stays busy but at night when she has slowed down pain is worse.  She is maintained on enbrel 50 mg weekly injection and the hydrocodone-apap. Some nights pain is worse when she has done a lot during the day.   Review of Systems:  Review of Systems  Constitutional: Negative for chills and fever.  Musculoskeletal: Positive for back pain, joint pain and myalgias.   Past Medical History:  Diagnosis Date  . H/O total knee replacement 2001   Right  . High cholesterol   . History of tonsillectomy and adenoidectomy 1946  . Psoriatic arthritis ([place]) 1990  . Sleep apnea with use of continuous positive airway pressure (CPAP) 2004  . Thyroid disease    Past Surgical History:  Procedure Laterality Date  . FOOT SURGERY     fused arch in left foot  . SPINE SURGERY    . TOTAL KNEE ARTHROPLASTY Left    Social History:   reports that she quit smoking about 46 years ago. Her smoking use included cigarettes. She has a 36.00 pack-year smoking history. She has never used smokeless tobacco. She reports current alcohol use of about 2.0 - 7.0 standard drinks of alcohol per week. She reports that she does  not use drugs.  Family History  Problem Relation Age of Onset  . Heart attack Father   . Heart disease Sister     Medications: Patient's Medications  New Prescriptions   No medications on file  Previous Medications   CALCIUM CARBONATE (TUMS - DOSED IN MG ELEMENTAL CALCIUM) 500 MG CHEWABLE TABLET    Chew 1 tablet by mouth as needed for indigestion or heartburn.   CHOLECALCIFEROL (VITAMIN D) 50 MCG (2000 UT) CAPS    Take 2,000 Units by mouth daily.   ETANERCEPT (ENBREL) 50 MG/ML INJECTION    Inject 50 mg into the skin every Sunday.    HYDROCODONE-ACETAMINOPHEN (NORCO) 5-325 MG TABLET    Take 1 tablet by mouth at bedtime.   LEVOTHYROXINE (SYNTHROID) 88 MCG TABLET    Take one tablet by mouth once daily 30 minutes before breakfast on an empty stomach for thyroid   RED YEAST RICE EXTRACT (RED YEAST RICE PO)    Take 2 tablets by mouth daily.  Modified Medications   No medications on file  Discontinued Medications   No medications on file    Physical Exam:  Vitals:   10/17/19 1413  BP: 140/72  Pulse: 82  Temp: 98.2 F (36.8 C)  TempSrc: Temporal  SpO2: 97%  Weight: 156 lb (70.8 kg)  Height: 5\' 7"  (1.702 m)   Body mass index is 24.43 kg/m.  Wt Readings from Last 3 Encounters:  10/17/19 156 lb (70.8 kg)  08/17/19 153 lb 9.6 oz (69.7 kg)  05/16/19 154 lb 6.4 oz (70 kg)    Physical Exam Constitutional:      Appearance: Normal appearance.  HENT:     Head: Normocephalic and atraumatic.  Cardiovascular:     Rate and Rhythm: Normal rate and regular rhythm.  Pulmonary:     Effort: Pulmonary effort is normal.     Breath sounds: Normal breath sounds.  Musculoskeletal:        General: Deformity (fingers; thoracic kyphosis) present.  Skin:    General: Skin is warm and dry.  Neurological:     General: No focal deficit present.     Mental Status: She is alert.  Psychiatric:        Mood and Affect: Mood normal.        Behavior: Behavior normal.     Labs reviewed: Basic  Metabolic Panel: Recent Labs    03/21/19 1330 05/02/19 0901 08/12/19 0951  NA 141 141 142  K 4.5 4.4 4.4  CL 106 109 106  CO2 20* 24 27  GLUCOSE 148* 101* 94  BUN 18 17 25   CREATININE 1.12* 1.07* 1.12*  CALCIUM 9.5 9.5 9.6  TSH  --  0.62  --    Liver Function Tests: Recent Labs    03/21/19 1330 05/02/19 0901 08/12/19 0951  AST 24 14 17   ALT 15 13 13   ALKPHOS 68  --   --   BILITOT 1.3* 0.6 0.5  PROT 7.7 7.5 7.4  ALBUMIN 4.1  --   --    Recent Labs    03/21/19 1330  LIPASE 34   No results for input(s): AMMONIA in the last 8760 hours. CBC: Recent Labs    03/21/19 1330 05/02/19 0901  WBC 7.0 5.4  NEUTROABS  --  2,446  HGB 15.7* 15.3  HCT 51.4* 47.3*  MCV 85.1 81.8  PLT 208 228   Lipid Panel: Recent Labs    05/02/19 0901 08/12/19 0951  CHOL 230* 196  HDL 53 51  LDLCALC 152* 125*  TRIG 129 95  CHOLHDL 4.3 3.8   TSH: Recent Labs    05/02/19 0901  TSH 0.62   A1C: No results found for: HGBA1C   Assessment/Plan 1. Hypothyroidism due to acquired atrophy of thyroid - levothyroxine (SYNTHROID) 88 MCG tablet; Take one tablet by mouth once daily 30 minutes before breakfast on an empty stomach for thyroid  Dispense: 90 tablet; Refill: 1  2. Psoriatic arthritis, 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[place]) -using hydrocodone-apap at bedtime only due to increase in pain at bedtime, discussed risk of adverse events including respiratory depression and death.  -can take up to  3000 mg of Acetaminophen (tylenol) daily in 24 hours There is 325 mg of acetaminophen in your hydrocodone. If you get regular strength Acetaminophen 325 mg - you can take 2 in the morning and 2 at bedtime if needed in addition your night time hydrocodone.  -signed updated opioid contract with office.  - UDS  Next appt: 02/14/2020 as previously scheduled.  Carlos American. [place], [facility] [phone]

## 2019-10-18 LAB — PAIN MGMT, PROFILE 1 W/O CONF, U
Amphetamines: NEGATIVE ng/mL
Barbiturates: NEGATIVE ng/mL
Benzodiazepines: NEGATIVE ng/mL
Cocaine Metabolite: NEGATIVE ng/mL
Creatinine: 24.3 mg/dL
Marijuana Metabolite: NEGATIVE ng/mL
Methadone Metabolite: NEGATIVE ng/mL
Opiates: POSITIVE ng/mL
Oxidant: NEGATIVE ug/mL
Oxycodone: NEGATIVE ng/mL
Phencyclidine: NEGATIVE ng/mL
pH: 5.5 (ref 4.5–9.0)

## 2019-11-08 ENCOUNTER — Telehealth: Payer: Self-pay | Admitting: *Deleted

## 2019-11-08 NOTE — Telephone Encounter (Signed)
Patient notified and agreed.  

## 2019-11-08 NOTE — Telephone Encounter (Signed)
Patient called and stated that she is signed up to receive the COVID 19 vaccination, but she wanted to make sure it was safe taking it with the medication she takes and the psoriatic Arthritis she has. Please Advise.   (ok to leave message if she does not answer)

## 2019-11-08 NOTE — Telephone Encounter (Signed)
Yes would advise her to take the COVID-19 vaccine.

## 2019-11-11 ENCOUNTER — Other Ambulatory Visit: Payer: Self-pay | Admitting: *Deleted

## 2019-11-11 DIAGNOSIS — L4052 Psoriatic arthritis mutilans: Secondary | ICD-10-CM

## 2019-11-11 DIAGNOSIS — G894 Chronic pain syndrome: Secondary | ICD-10-CM

## 2019-11-11 MED ORDER — HYDROCODONE-ACETAMINOPHEN 5-325 MG PO TABS: 1.0000 | ORAL_TABLET | Freq: Every day | ORAL | 0 refills | Status: DC

## 2019-11-11 NOTE — Telephone Encounter (Signed)
Patient requested refill [place] Verified LR: 10/10/2019 Pended Rx and sent to [facility] for approval.  Narcotic Contract updated.

## 2019-11-13 ENCOUNTER — Ambulatory Visit: Payer: Medicare Other | Attending: Internal Medicine

## 2019-11-13 DIAGNOSIS — Z23 Encounter for immunization: Secondary | ICD-10-CM | POA: Insufficient documentation

## 2019-11-13 NOTE — Progress Notes (Signed)
   Covid-19 Vaccination Clinic  Name:  Sydney Reid    MRN: [id number] DOB: 01-12-1941  11/13/2019  [name] was observed post Covid-19 immunization for 15 minutes without incidence. She was provided with Vaccine Information Sheet and instruction to access the V-Safe system.   [name] was instructed to call 911 with any severe reactions post vaccine: Marland Kitchen Difficulty breathing  . Swelling of your face and throat  . A fast heartbeat  . A bad rash all over your body  . Dizziness and weakness   11:01

## 2019-11-14 DIAGNOSIS — M8580 Other specified disorders of bone density and structure, unspecified site: Secondary | ICD-10-CM | POA: Diagnosis not present

## 2019-11-14 DIAGNOSIS — M7581 Other shoulder lesions, right shoulder: Secondary | ICD-10-CM | POA: Diagnosis not present

## 2019-11-14 DIAGNOSIS — M545 Low back pain: Secondary | ICD-10-CM | POA: Diagnosis not present

## 2019-11-14 DIAGNOSIS — M2550 Pain in unspecified joint: Secondary | ICD-10-CM | POA: Diagnosis not present

## 2019-11-14 DIAGNOSIS — M25511 Pain in right shoulder: Secondary | ICD-10-CM | POA: Diagnosis not present

## 2019-11-14 DIAGNOSIS — M1990 Unspecified osteoarthritis, unspecified site: Secondary | ICD-10-CM | POA: Diagnosis not present

## 2019-11-14 DIAGNOSIS — Z79899 Other long term (current) drug therapy: Secondary | ICD-10-CM | POA: Diagnosis not present

## 2019-11-14 DIAGNOSIS — L4050 Arthropathic psoriasis, unspecified: Secondary | ICD-10-CM | POA: Diagnosis not present

## 2019-11-14 DIAGNOSIS — L409 Psoriasis, unspecified: Secondary | ICD-10-CM | POA: Diagnosis not present

## 2019-12-02 DIAGNOSIS — H18519 Endothelial corneal dystrophy, unspecified eye: Secondary | ICD-10-CM | POA: Diagnosis not present

## 2019-12-04 ENCOUNTER — Ambulatory Visit: Payer: Medicare Other | Attending: Internal Medicine

## 2019-12-04 DIAGNOSIS — Z23 Encounter for immunization: Secondary | ICD-10-CM

## 2019-12-04 NOTE — Progress Notes (Signed)
   Covid-19 Vaccination Clinic  Name:  Sydney Reid    MRN: [id number] DOB: 1941-03-27  12/04/2019  [name] was observed post Covid-19 immunization for 15 minutes without incidence. She was provided with Vaccine Information Sheet and instruction to access the V-Safe system.   [name] was instructed to call 911 with any severe reactions post vaccine: Marland Kitchen Difficulty breathing  . Swelling of your face and throat  . A fast heartbeat  . A bad rash all over your body  . Dizziness and weakness    Immunizations Administered    Name Date Dose VIS Date Route   Pfizer COVID-19 Vaccine 12/04/2019 10:27 AM 0.3 mL 10/07/2019 Intramuscular   Manufacturer: [place]   Lot: [id number]   [place]: [phone]

## 2019-12-09 ENCOUNTER — Other Ambulatory Visit: Payer: Self-pay | Admitting: *Deleted

## 2019-12-09 DIAGNOSIS — L4052 Psoriatic arthritis mutilans: Secondary | ICD-10-CM

## 2019-12-09 DIAGNOSIS — G894 Chronic pain syndrome: Secondary | ICD-10-CM

## 2019-12-09 MED ORDER — HYDROCODONE-ACETAMINOPHEN 5-325 MG PO TABS: 1.0000 | ORAL_TABLET | Freq: Every day | ORAL | 0 refills | Status: DC

## 2019-12-09 NOTE — Telephone Encounter (Signed)
Patient requested refill [place] Verified LR: 11/11/2019 Narcotic Contract updated.  Pended Rx and sent to [facility] for approval.

## 2019-12-19 DIAGNOSIS — I70213 Atherosclerosis of native arteries of extremities with intermittent claudication, bilateral legs: Secondary | ICD-10-CM | POA: Diagnosis not present

## 2019-12-19 DIAGNOSIS — B351 Tinea unguium: Secondary | ICD-10-CM | POA: Diagnosis not present

## 2019-12-26 DIAGNOSIS — Z1231 Encounter for screening mammogram for malignant neoplasm of breast: Secondary | ICD-10-CM | POA: Diagnosis not present

## 2019-12-26 LAB — HM MAMMOGRAPHY

## 2019-12-28 ENCOUNTER — Encounter: Payer: Self-pay | Admitting: Nurse Practitioner

## 2019-12-28 ENCOUNTER — Telehealth: Payer: Self-pay

## 2019-12-28 NOTE — Telephone Encounter (Signed)
Called patient and informed her of negative mammogram results.  Per patient she was contacted by [facility] mammography as well with the results yet she appreciates [place] following through.   Paper report sent to scanning.

## 2020-01-11 ENCOUNTER — Other Ambulatory Visit: Payer: Self-pay | Admitting: *Deleted

## 2020-01-11 DIAGNOSIS — L4052 Psoriatic arthritis mutilans: Secondary | ICD-10-CM

## 2020-01-11 DIAGNOSIS — G894 Chronic pain syndrome: Secondary | ICD-10-CM

## 2020-01-11 MED ORDER — HYDROCODONE-ACETAMINOPHEN 5-325 MG PO TABS: 1.0000 | ORAL_TABLET | Freq: Every day | ORAL | 0 refills | Status: DC

## 2020-01-11 NOTE — Telephone Encounter (Signed)
Patient requested refill [place] Verified LR:12/10/2019 Narcotic Contract updated. Pended Rx and sent to [facility] for approval.

## 2020-02-10 ENCOUNTER — Other Ambulatory Visit: Payer: Self-pay | Admitting: *Deleted

## 2020-02-10 DIAGNOSIS — G894 Chronic pain syndrome: Secondary | ICD-10-CM

## 2020-02-10 DIAGNOSIS — L4052 Psoriatic arthritis mutilans: Secondary | ICD-10-CM

## 2020-02-10 MED ORDER — HYDROCODONE-ACETAMINOPHEN 5-325 MG PO TABS: 1.0000 | ORAL_TABLET | Freq: Every day | ORAL | 0 refills | Status: DC

## 2020-02-10 NOTE — Telephone Encounter (Signed)
Patient called and requested Rx [place] Verified LR: 01/11/2020 Contract updated Pended Rx and sent to [facility] for approval.

## 2020-02-14 ENCOUNTER — Other Ambulatory Visit: Payer: Medicare Other

## 2020-02-14 ENCOUNTER — Other Ambulatory Visit: Payer: Self-pay

## 2020-02-14 DIAGNOSIS — E034 Atrophy of thyroid (acquired): Secondary | ICD-10-CM

## 2020-02-14 DIAGNOSIS — M8580 Other specified disorders of bone density and structure, unspecified site: Secondary | ICD-10-CM

## 2020-02-14 DIAGNOSIS — E782 Mixed hyperlipidemia: Secondary | ICD-10-CM | POA: Diagnosis not present

## 2020-02-14 DIAGNOSIS — L4052 Psoriatic arthritis mutilans: Secondary | ICD-10-CM

## 2020-02-14 LAB — COMPLETE METABOLIC PANEL WITH GFR
AG Ratio: 1.3 (calc) (ref 1.0–2.5)
ALT: 15 U/L (ref 6–29)
AST: 19 U/L (ref 10–35)
Albumin: 4.3 g/dL (ref 3.6–5.1)
Alkaline phosphatase (APISO): 69 U/L (ref 37–153)
BUN/Creatinine Ratio: 17 (calc) (ref 6–22)
BUN: 19 mg/dL (ref 7–25)
CO2: 24 mmol/L (ref 20–32)
Calcium: 9.5 mg/dL (ref 8.6–10.4)
Chloride: 105 mmol/L (ref 98–110)
Creat: 1.12 mg/dL — ABNORMAL HIGH (ref 0.60–0.93)
GFR, Est African American: 54 mL/min/{1.73_m2} — ABNORMAL LOW (ref >=60)
GFR, Est Non African American: 47 mL/min/{1.73_m2} — ABNORMAL LOW (ref >=60)
Globulin: 3.2 g/dL (calc) (ref 1.9–3.7)
Glucose, Bld: 88 mg/dL (ref 65–99)
Potassium: 4.6 mmol/L (ref 3.5–5.3)
Sodium: 139 mmol/L (ref 135–146)
Total Bilirubin: 0.5 mg/dL (ref 0.2–1.2)
Total Protein: 7.5 g/dL (ref 6.1–8.1)

## 2020-02-14 LAB — CBC WITH DIFFERENTIAL/PLATELET
Absolute Monocytes: 643 cells/uL (ref 200–950)
Basophils Absolute: 0 cells/uL (ref 0–200)
Basophils Relative: 0 %
Eosinophils Absolute: 0 cells/uL — ABNORMAL LOW (ref 15–500)
Eosinophils Relative: 0 %
HCT: 46.9 % — ABNORMAL HIGH (ref 35.0–45.0)
Hemoglobin: 15 g/dL (ref 11.7–15.5)
Lymphs Abs: 2213 cells/uL (ref 850–3900)
MCH: 26.7 pg — ABNORMAL LOW (ref 27.0–33.0)
MCHC: 32 g/dL (ref 32.0–36.0)
MCV: 83.5 fL (ref 80.0–100.0)
MPV: 10.9 fL (ref 7.5–12.5)
Monocytes Relative: 10.9 %
Neutro Abs: 3044 cells/uL (ref 1500–7800)
Neutrophils Relative %: 51.6 %
Platelets: 235 10*3/uL (ref 140–400)
RBC: 5.62 10*6/uL — ABNORMAL HIGH (ref 3.80–5.10)
RDW: 13.3 % (ref 11.0–15.0)
Total Lymphocyte: 37.5 %
WBC: 5.9 10*3/uL (ref 3.8–10.8)

## 2020-02-14 LAB — TSH: TSH: 0.91 mIU/L (ref 0.40–4.50)

## 2020-02-17 ENCOUNTER — Ambulatory Visit (INDEPENDENT_AMBULATORY_CARE_PROVIDER_SITE_OTHER): Payer: Medicare Other | Admitting: Nurse Practitioner

## 2020-02-17 ENCOUNTER — Encounter: Payer: Self-pay | Admitting: Nurse Practitioner

## 2020-02-17 ENCOUNTER — Other Ambulatory Visit: Payer: Self-pay

## 2020-02-17 VITALS — BP 112/70 | HR 77 | Temp 97.1°F | Ht 67.0 in | Wt 157.8 lb

## 2020-02-17 DIAGNOSIS — E782 Mixed hyperlipidemia: Secondary | ICD-10-CM | POA: Diagnosis not present

## 2020-02-17 DIAGNOSIS — M8580 Other specified disorders of bone density and structure, unspecified site: Secondary | ICD-10-CM | POA: Diagnosis not present

## 2020-02-17 DIAGNOSIS — E559 Vitamin D deficiency, unspecified: Secondary | ICD-10-CM

## 2020-02-17 DIAGNOSIS — E034 Atrophy of thyroid (acquired): Secondary | ICD-10-CM

## 2020-02-17 DIAGNOSIS — L4052 Psoriatic arthritis mutilans: Secondary | ICD-10-CM | POA: Diagnosis not present

## 2020-02-17 NOTE — Progress Notes (Signed)
Careteam: Patient Care Team: [name], NP as PCP - General (Geriatric Medicine) Tomma Rakers, MD as Referring Physician (Ophthalmology)  PLACE OF SERVICE:  [place] Directive information Does Patient Have a Medical Advance Directive?: Yes, Type of Advance Directive: [place];Living will, Does patient want to make changes to medical advance directive?: No - Patient declined  Allergies  Allergen Reactions  . Codeine Nausea And Vomiting    Weakness and dysequilibrium  . Statins Hives    Myalgias     Chief Complaint  Patient presents with  . Medical Management of Chronic Issues    6 month follow-up, discuss laby (copy printed)      HPI: Patient is a 79 y.o. female seen in office today for routine follow up.   Reports the last 4 months she has not been eating right or exercising. Last 4 months have been hard bc her best friend was diagnosised and died from [place]. This is what her mother passed away from. Has been tired. [place]  Been cooking for 5 additional ppl.   Hx of kidney stones and did imagining and noted gallstone as well on ED visit in May 2020, no further episodes  Hx of extrasystoles- had some skipped beats and had to wear a monitor. Has murmur and did echo in 2017, cardiologist evaluation said to follow up PRN.    Review of Systems:  Review of Systems  Constitutional: Negative for chills, fever and weight loss.  HENT: Negative for tinnitus.   Respiratory: Negative for cough, sputum production and shortness of breath.   Cardiovascular: Negative for chest pain, palpitations and leg swelling.  Gastrointestinal: Negative for abdominal pain, constipation, diarrhea and heartburn.  Genitourinary: Negative for dysuria, frequency and urgency.  Musculoskeletal: Negative for back pain, falls, joint pain and myalgias.  Skin: Negative.   Neurological: Negative for dizziness and headaches.    Psychiatric/Behavioral: Negative for depression and memory loss. The patient does not have insomnia.     Past Medical History:  Diagnosis Date  . H/O total knee replacement 2001   Right  . High cholesterol   . History of tonsillectomy and adenoidectomy 1946  . Psoriatic arthritis ([place]) 1990  . Sleep apnea with use of continuous positive airway pressure (CPAP) 2004  . Thyroid disease    Past Surgical History:  Procedure Laterality Date  . FOOT SURGERY     fused arch in left foot  . SPINE SURGERY    . TOTAL KNEE ARTHROPLASTY Left    Social History:   reports that she quit smoking about 46 years ago. Her smoking use included cigarettes. She has a 36.00 pack-year smoking history. She has never used smokeless tobacco. She reports current alcohol use of about 2.0 - 7.0 standard drinks of alcohol per week. She reports that she does not use drugs.  Family History  Problem Relation Age of Onset  . Heart attack Father   . Heart disease Sister     Medications: Patient's Medications  New Prescriptions   No medications on file  Previous Medications   CALCIUM CARBONATE (TUMS - DOSED IN MG ELEMENTAL CALCIUM) 500 MG CHEWABLE TABLET    Chew 1 tablet by mouth as needed for indigestion or heartburn.   CHOLECALCIFEROL (VITAMIN D) 50 MCG (2000 UT) CAPS    Take 2,000 Units by mouth daily.   ETANERCEPT (ENBREL) 50 MG/ML INJECTION    Inject 50 mg into  the skin every Sunday.    HYDROCODONE-ACETAMINOPHEN (NORCO) 5-325 MG TABLET    Take 1 tablet by mouth at bedtime.   LEVOTHYROXINE (SYNTHROID) 88 MCG TABLET    Take one tablet by mouth once daily 30 minutes before breakfast on an empty stomach for thyroid   RED YEAST RICE EXTRACT (RED YEAST RICE PO)    Take 2 tablets by mouth daily.  Modified Medications   No medications on file  Discontinued Medications   No medications on file    Physical Exam:  Vitals:   02/17/20 1053  BP: 112/70  Pulse: 77  Temp: (!) 97.1 F (36.2 C)  TempSrc:  Temporal  SpO2: 99%  Weight: 157 lb 12.8 oz (71.6 kg)  Height: 5\' 7"  (1.702 m)   Body mass index is 24.71 kg/m. Wt Readings from Last 3 Encounters:  02/17/20 157 lb 12.8 oz (71.6 kg)  10/17/19 156 lb (70.8 kg)  08/17/19 153 lb 9.6 oz (69.7 kg)    Physical Exam Constitutional:      General: She is not in acute distress.    Appearance: She is well-developed. She is not diaphoretic.  HENT:     Head: Normocephalic and atraumatic.     Mouth/Throat:     Pharynx: No oropharyngeal exudate.  Eyes:     Conjunctiva/sclera: Conjunctivae normal.     Pupils: Pupils are equal, round, and reactive to light.  Cardiovascular:     Rate and Rhythm: Normal rate and regular rhythm.     Heart sounds: Normal heart sounds.  Pulmonary:     Effort: Pulmonary effort is normal.     Breath sounds: Normal breath sounds.  Abdominal:     General: Bowel sounds are normal.     Palpations: Abdomen is soft.  Musculoskeletal:     Cervical back: Normal range of motion and neck supple.     Comments: Severe arthritic changes noted to bilateral hands  Skin:    General: Skin is warm and dry.  Neurological:     Mental Status: She is alert and oriented to person, place, and time.  Psychiatric:        Mood and Affect: Mood normal.        Behavior: Behavior normal.     Labs reviewed: Basic Metabolic Panel: Recent Labs    05/02/19 0901 08/12/19 0951 02/14/20 0949  NA 141 142 139  K 4.4 4.4 4.6  CL 109 106 105  CO2 24 27 24   GLUCOSE 101* 94 88  BUN 17 25 19   CREATININE 1.07* 1.12* 1.12*  CALCIUM 9.5 9.6 9.5  TSH 0.62  --  0.91   Liver Function Tests: Recent Labs    03/21/19 1330 03/21/19 1330 05/02/19 0901 08/12/19 0951 02/14/20 0949  AST 24   < > 14 17 19   ALT 15   < > 13 13 15   ALKPHOS 68  --   --   --   --   BILITOT 1.3*   < > 0.6 0.5 0.5  PROT 7.7   < > 7.5 7.4 7.5  ALBUMIN 4.1  --   --   --   --    < > = values in this interval not displayed.   Recent Labs    03/21/19 1330   LIPASE 34   No results for input(s): AMMONIA in the last 8760 hours. CBC: Recent Labs    03/21/19 1330 05/02/19 0901 02/14/20 0949  WBC 7.0 5.4 5.9  NEUTROABS  --  2,446 3,044  HGB 15.7* 15.3 15.0  HCT 51.4* 47.3* 46.9*  MCV 85.1 81.8 83.5  PLT 208 228 235   Lipid Panel: Recent Labs    05/02/19 0901 08/12/19 0951  CHOL 230* 196  HDL 53 51  LDLCALC 152* 125*  TRIG 129 95  CHOLHDL 4.3 3.8   TSH: Recent Labs    05/02/19 0901 02/14/20 0949  TSH 0.62 0.91   A1C: No results found for: HGBA1C   Assessment/Plan 1. 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  Using Hydrocodone-apap as needed for severe pain.  - CBC with Differential/Platelet; Future  2. Hypothyroidism due to acquired atrophy of thyroid TSH stable on synthroid 88 mcg   3. Mixed hyperlipidemia counseling given on healthy eating and lifestyle modifications. Continues on red yeast rice, she does not tolerate statins. - Lipid Panel; Future - COMPLETE METABOLIC PANEL WITH GFR; Future  4. Osteopenia, unspecified location -continue with cal and vit d, recommended weight bearing activities.   5. Vitamin D deficiency -continues on vit d supplements.  - Vitamin D, 25-hydroxy; Future  Next appt: 4 weeks for MOST, 6 months for routine follow up with labs prior  Ysabelle Goodroe K. [place], [facility] [phone]

## 2020-02-17 NOTE — Patient Instructions (Addendum)
To get your TDAP at the local pharmacy Follow up in 4 weeks for advance care planning- BRING forms, will complete MOST at time of visit.   Follow up in 6 months with labs prior   Heart-Healthy Eating Plan Many factors influence your heart (coronary) health, including eating and exercise habits. Coronary risk increases with abnormal blood fat (lipid) levels. Heart-healthy meal planning includes limiting unhealthy fats, increasing healthy fats, and making other diet and lifestyle changes. What are tips for following this plan? Cooking Cook foods using methods other than frying. Baking, boiling, grilling, and broiling are all good options. Other ways to reduce fat include:  Removing the skin from poultry.  Removing all visible fats from meats.  Steaming vegetables in water or broth. Meal planning   At meals, imagine dividing your plate into fourths: ? Fill one-half of your plate with vegetables and green salads. ? Fill one-fourth of your plate with whole grains. ? Fill one-fourth of your plate with lean protein foods.  Eat 4-5 servings of vegetables per day. One serving equals 1 cup raw or cooked vegetable, or 2 cups raw leafy greens.  Eat 4-5 servings of fruit per day. One serving equals 1 medium whole fruit,  cup dried fruit,  cup fresh, frozen, or canned fruit, or  cup 100% fruit juice.  Eat more foods that contain soluble fiber. Examples include apples, broccoli, carrots, beans, peas, and barley. Aim to get 25-30 g of fiber per day.  Increase your consumption of legumes, nuts, and seeds to 4-5 servings per week. One serving of dried beans or legumes equals  cup cooked, 1 serving of nuts is  cup, and 1 serving of seeds equals 1 tablespoon. Fats  Choose healthy fats more often. Choose monounsaturated and polyunsaturated fats, such as olive and canola oils, flaxseeds, walnuts, almonds, and seeds.  Eat more omega-3 fats. Choose salmon, mackerel, sardines, tuna, flaxseed oil, and  ground flaxseeds. Aim to eat fish at least 2 times each week.  Check food labels carefully to identify foods with trans fats or high amounts of saturated fat.  Limit saturated fats. These are found in animal products, such as meats, butter, and cream. Plant sources of saturated fats include palm oil, palm kernel oil, and coconut oil.  Avoid foods with partially hydrogenated oils in them. These contain trans fats. Examples are stick margarine, some tub margarines, cookies, crackers, and other baked goods.  Avoid fried foods. General information  Eat more home-cooked food and less restaurant, buffet, and fast food.  Limit or avoid alcohol.  Limit foods that are high in starch and sugar.  Lose weight if you are overweight. Losing just 5-10% of your body weight can help your overall health and prevent diseases such as diabetes and heart disease.  Monitor your salt (sodium) intake, especially if you have high blood pressure. Talk with your health care provider about your sodium intake.  Try to incorporate more vegetarian meals weekly. What foods can I eat? Fruits All fresh, canned (in natural juice), or frozen fruits. Vegetables Fresh or frozen vegetables (raw, steamed, roasted, or grilled). Green salads. Grains Most grains. Choose whole wheat and whole grains most of the time. Rice and pasta, including brown rice and pastas made with whole wheat. Meats and other proteins Lean, well-trimmed beef, veal, pork, and lamb. Chicken and [place] without skin. All fish and shellfish. Wild duck, rabbit, pheasant, and venison. Egg whites or low-cholesterol egg substitutes. Dried beans, peas, lentils, and tofu. Seeds and most nuts.  Dairy Low-fat or nonfat cheeses, including ricotta and mozzarella. Skim or 1% milk (liquid, powdered, or evaporated). Buttermilk made with low-fat milk. Nonfat or low-fat yogurt. Fats and oils Non-hydrogenated (trans-free) margarines. Vegetable oils, including soybean,  sesame, sunflower, olive, peanut, safflower, corn, canola, and cottonseed. Salad dressings or mayonnaise made with a vegetable oil. Beverages Water (mineral or sparkling). Coffee and tea. Diet carbonated beverages. Sweets and desserts Sherbet, gelatin, and fruit ice. Small amounts of dark chocolate. Limit all sweets and desserts. Seasonings and condiments All seasonings and condiments. The items listed above may not be a complete list of foods and beverages you can eat. Contact a dietitian for more options. What foods are not recommended? Fruits Canned fruit in heavy syrup. Fruit in cream or butter sauce. Fried fruit. Limit coconut. Vegetables Vegetables cooked in cheese, cream, or butter sauce. Fried vegetables. Grains Breads made with saturated or trans fats, oils, or whole milk. Croissants. Sweet rolls. Donuts. High-fat crackers, such as cheese crackers. Meats and other proteins Fatty meats, such as hot dogs, ribs, sausage, bacon, rib-eye roast or steak. High-fat deli meats, such as salami and bologna. Caviar. Domestic duck and goose. Organ meats, such as liver. Dairy Cream, sour cream, cream cheese, and creamed cottage cheese. Whole milk cheeses. Whole or 2% milk (liquid, evaporated, or condensed). Whole buttermilk. Cream sauce or high-fat cheese sauce. Whole-milk yogurt. Fats and oils Meat fat, or shortening. Cocoa butter, hydrogenated oils, palm oil, coconut oil, palm kernel oil. Solid fats and shortenings, including bacon fat, salt pork, lard, and butter. Nondairy cream substitutes. Salad dressings with cheese or sour cream. Beverages Regular sodas and any drinks with added sugar. Sweets and desserts Frosting. Pudding. Cookies. Cakes. Pies. Milk chocolate or white chocolate. Buttered syrups. Full-fat ice cream or ice cream drinks. The items listed above may not be a complete list of foods and beverages to avoid. Contact a dietitian for more information. Summary  Heart-healthy meal  planning includes limiting unhealthy fats, increasing healthy fats, and making other diet and lifestyle changes.  Lose weight if you are overweight. Losing just 5-10% of your body weight can help your overall health and prevent diseases such as diabetes and heart disease.  Focus on eating a balance of foods, including fruits and vegetables, low-fat or nonfat dairy, lean protein, nuts and legumes, whole grains, and heart-healthy oils and fats. This information is not intended to replace advice given to you by your health care provider. Make sure you discuss any questions you have with your health care provider. Document Revised: 11/20/2017 Document Reviewed: 11/20/2017 Elsevier Patient Education  2020 [employer].

## 2020-03-12 ENCOUNTER — Other Ambulatory Visit: Payer: Self-pay | Admitting: *Deleted

## 2020-03-12 DIAGNOSIS — G894 Chronic pain syndrome: Secondary | ICD-10-CM

## 2020-03-12 DIAGNOSIS — L4052 Psoriatic arthritis mutilans: Secondary | ICD-10-CM

## 2020-03-12 MED ORDER — HYDROCODONE-ACETAMINOPHEN 5-325 MG PO TABS: 1.0000 | ORAL_TABLET | Freq: Every day | ORAL | 0 refills | Status: DC

## 2020-03-12 NOTE — Telephone Encounter (Signed)
Patient requested refill [place] Verified LR: 02/10/2020 Contract updated Pended Rx and sent to [facility] for approval.

## 2020-03-19 ENCOUNTER — Ambulatory Visit: Payer: Medicare Other | Admitting: Nurse Practitioner

## 2020-04-04 DIAGNOSIS — I70203 Unspecified atherosclerosis of native arteries of extremities, bilateral legs: Secondary | ICD-10-CM | POA: Diagnosis not present

## 2020-04-04 DIAGNOSIS — B351 Tinea unguium: Secondary | ICD-10-CM | POA: Diagnosis not present

## 2020-04-11 ENCOUNTER — Other Ambulatory Visit: Payer: Self-pay

## 2020-04-11 DIAGNOSIS — L4052 Psoriatic arthritis mutilans: Secondary | ICD-10-CM

## 2020-04-11 DIAGNOSIS — G894 Chronic pain syndrome: Secondary | ICD-10-CM

## 2020-04-11 MED ORDER — HYDROCODONE-ACETAMINOPHEN 5-325 MG PO TABS: 1.0000 | ORAL_TABLET | Freq: Every day | ORAL | 0 refills | Status: DC

## 2020-05-04 ENCOUNTER — Other Ambulatory Visit: Payer: Self-pay | Admitting: Nurse Practitioner

## 2020-05-04 DIAGNOSIS — E034 Atrophy of thyroid (acquired): Secondary | ICD-10-CM

## 2020-05-07 ENCOUNTER — Other Ambulatory Visit: Payer: Self-pay

## 2020-05-07 ENCOUNTER — Other Ambulatory Visit: Payer: Medicare Other

## 2020-05-08 ENCOUNTER — Other Ambulatory Visit: Payer: Medicare Other

## 2020-05-10 ENCOUNTER — Other Ambulatory Visit: Payer: Self-pay | Admitting: *Deleted

## 2020-05-10 DIAGNOSIS — G894 Chronic pain syndrome: Secondary | ICD-10-CM

## 2020-05-10 DIAGNOSIS — L4052 Psoriatic arthritis mutilans: Secondary | ICD-10-CM

## 2020-05-10 MED ORDER — HYDROCODONE-ACETAMINOPHEN 5-325 MG PO TABS: 1.0000 | ORAL_TABLET | Freq: Every day | ORAL | 0 refills | Status: DC

## 2020-05-10 NOTE — Telephone Encounter (Signed)
Patient requested refill [place] LR: 04/11/20 Pended Rx and sent to [place] for approval. Janett Billow out of office)

## 2020-05-15 ENCOUNTER — Encounter: Payer: Self-pay | Admitting: Nurse Practitioner

## 2020-05-15 ENCOUNTER — Telehealth: Payer: Self-pay

## 2020-05-15 ENCOUNTER — Other Ambulatory Visit: Payer: Self-pay

## 2020-05-15 ENCOUNTER — Ambulatory Visit (INDEPENDENT_AMBULATORY_CARE_PROVIDER_SITE_OTHER): Payer: Medicare Other | Admitting: Nurse Practitioner

## 2020-05-15 DIAGNOSIS — M2550 Pain in unspecified joint: Secondary | ICD-10-CM | POA: Diagnosis not present

## 2020-05-15 DIAGNOSIS — M1990 Unspecified osteoarthritis, unspecified site: Secondary | ICD-10-CM | POA: Diagnosis not present

## 2020-05-15 DIAGNOSIS — Z0000 Encounter for general adult medical examination without abnormal findings: Secondary | ICD-10-CM | POA: Diagnosis not present

## 2020-05-15 DIAGNOSIS — M8580 Other specified disorders of bone density and structure, unspecified site: Secondary | ICD-10-CM | POA: Diagnosis not present

## 2020-05-15 DIAGNOSIS — L4050 Arthropathic psoriasis, unspecified: Secondary | ICD-10-CM | POA: Diagnosis not present

## 2020-05-15 DIAGNOSIS — E2839 Other primary ovarian failure: Secondary | ICD-10-CM

## 2020-05-15 DIAGNOSIS — M545 Low back pain: Secondary | ICD-10-CM | POA: Diagnosis not present

## 2020-05-15 DIAGNOSIS — Z79899 Other long term (current) drug therapy: Secondary | ICD-10-CM | POA: Diagnosis not present

## 2020-05-15 DIAGNOSIS — L409 Psoriasis, unspecified: Secondary | ICD-10-CM | POA: Diagnosis not present

## 2020-05-15 NOTE — Patient Instructions (Signed)
Sydney Reid , Thank you for taking time to come for your Medicare Wellness Visit. I appreciate your ongoing commitment to your health goals. Please review the following plan we discussed and let me know if I can assist you in the future.   Screening recommendations/referrals: Colonoscopy: aged out Mammogram: scheduled in feb Bone Density: ordered today.  Recommended yearly ophthalmology/optometry visit for glaucoma screening and checkup Recommended yearly dental visit for hygiene and checkup  Vaccinations: Influenza vaccine: up to date, due to September 21  Pneumococcal vaccine: up to date Tdap vaccine: DUE- to update at local pharmacy  Shingles vaccine up to date    Advanced directives: to bring to next visit to place on file  Conditions/risks identified: cardiovascular risk, osteopenia, advanced age  Next appointment: 1 year   Preventive Care 32 Years and Older, Female Preventive care refers to lifestyle choices and visits with your health care provider that can promote health and wellness. What does preventive care include?  A yearly physical exam. This is also called an annual well check.  Dental exams once or twice a year.  Routine eye exams. Ask your health care provider how often you should have your eyes checked.  Personal lifestyle choices, including:  Daily care of your teeth and gums.  Regular physical activity.  Eating a healthy diet.  Avoiding tobacco and drug use.  Limiting alcohol use.  Practicing safe sex.  Taking low-dose aspirin every day.  Taking vitamin and mineral supplements as recommended by your health care provider. What happens during an annual well check? The services and screenings done by your health care provider during your annual well check will depend on your age, overall health, lifestyle risk factors, and family history of disease. Counseling  Your health care provider may ask you questions about your:  Alcohol use.  Tobacco  use.  Drug use.  Emotional well-being.  Home and relationship well-being.  Sexual activity.  Eating habits.  History of falls.  Memory and ability to understand (cognition).  Work and work Statistician.  Reproductive health. Screening  You may have the following tests or measurements:  Height, weight, and BMI.  Blood pressure.  Lipid and cholesterol levels. These may be checked every 5 years, or more frequently if you are over 77 years old.  Skin check.  Lung cancer screening. You may have this screening every year starting at age 64 if you have a 30-pack-year history of smoking and currently smoke or have quit within the past 15 years.  Fecal occult blood test (FOBT) of the stool. You may have this test every year starting at age 28.  Flexible sigmoidoscopy or colonoscopy. You may have a sigmoidoscopy every 5 years or a colonoscopy every 10 years starting at age 9.  Hepatitis C blood test.  Hepatitis B blood test.  Sexually transmitted disease (STD) testing.  Diabetes screening. This is done by checking your blood sugar (glucose) after you have not eaten for a while (fasting). You may have this done every 1-3 years.  Bone density scan. This is done to screen for osteoporosis. You may have this done starting at age 42.  Mammogram. This may be done every 1-2 years. Talk to your health care provider about how often you should have regular mammograms. Talk with your health care provider about your test results, treatment options, and if necessary, the need for more tests. Vaccines  Your health care provider may recommend certain vaccines, such as:  Influenza vaccine. This is recommended every year.  Tetanus, diphtheria, and acellular pertussis (Tdap, Td) vaccine. You may need a Td booster every 10 years.  Zoster vaccine. You may need this after age 52.  Pneumococcal 13-valent conjugate (PCV13) vaccine. One dose is recommended after age 26.  Pneumococcal  polysaccharide (PPSV23) vaccine. One dose is recommended after age 84. Talk to your health care provider about which screenings and vaccines you need and how often you need them. This information is not intended to replace advice given to you by your health care provider. Make sure you discuss any questions you have with your health care provider. Document Released: 11/09/2015 Document Revised: 07/02/2016 Document Reviewed: 08/14/2015 Elsevier Interactive Patient Education  2017 [place] Prevention in the Home Falls can cause injuries. They can happen to people of all ages. There are many things you can do to make your home safe and to help prevent falls. What can I do on the outside of my home?  Regularly fix the edges of walkways and driveways and fix any cracks.  Remove anything that might make you trip as you walk through a door, such as a raised step or threshold.  Trim any bushes or trees on the path to your home.  Use bright outdoor lighting.  Clear any walking paths of anything that might make someone trip, such as rocks or tools.  Regularly check to see if handrails are loose or broken. Make sure that both sides of any steps have handrails.  Any raised decks and porches should have guardrails on the edges.  Have any leaves, snow, or ice cleared regularly.  Use sand or salt on walking paths during winter.  Clean up any spills in your garage right away. This includes oil or grease spills. What can I do in the bathroom?  Use night lights.  Install grab bars by the toilet and in the tub and shower. Do not use towel bars as grab bars.  Use non-skid mats or decals in the tub or shower.  If you need to sit down in the shower, use a plastic, non-slip stool.  Keep the floor dry. Clean up any water that spills on the floor as soon as it happens.  Remove soap buildup in the tub or shower regularly.  Attach bath mats securely with double-sided non-slip rug  tape.  Do not have throw rugs and other things on the floor that can make you trip. What can I do in the bedroom?  Use night lights.  Make sure that you have a light by your bed that is easy to reach.  Do not use any sheets or blankets that are too big for your bed. They should not hang down onto the floor.  Have a firm chair that has side arms. You can use this for support while you get dressed.  Do not have throw rugs and other things on the floor that can make you trip. What can I do in the kitchen?  Clean up any spills right away.  Avoid walking on wet floors.  Keep items that you use a lot in easy-to-reach places.  If you need to reach something above you, use a strong step stool that has a grab bar.  Keep electrical cords out of the way.  Do not use floor polish or wax that makes floors slippery. If you must use wax, use non-skid floor wax.  Do not have throw rugs and other things on the floor that can make you trip. What can I do  with my stairs?  Do not leave any items on the stairs.  Make sure that there are handrails on both sides of the stairs and use them. Fix handrails that are broken or loose. Make sure that handrails are as long as the stairways.  Check any carpeting to make sure that it is firmly attached to the stairs. Fix any carpet that is loose or worn.  Avoid having throw rugs at the top or bottom of the stairs. If you do have throw rugs, attach them to the floor with carpet tape.  Make sure that you have a light switch at the top of the stairs and the bottom of the stairs. If you do not have them, ask someone to add them for you. What else can I do to help prevent falls?  Wear shoes that:  Do not have high heels.  Have rubber bottoms.  Are comfortable and fit you well.  Are closed at the toe. Do not wear sandals.  If you use a stepladder:  Make sure that it is fully opened. Do not climb a closed stepladder.  Make sure that both sides of the  stepladder are locked into place.  Ask someone to hold it for you, if possible.  Clearly mark and make sure that you can see:  Any grab bars or handrails.  First and last steps.  Where the edge of each step is.  Use tools that help you move around (mobility aids) if they are needed. These include:  Canes.  Walkers.  Scooters.  Crutches.  Turn on the lights when you go into a dark area. Replace any light bulbs as soon as they burn out.  Set up your furniture so you have a clear path. Avoid moving your furniture around.  If any of your floors are uneven, fix them.  If there are any pets around you, be aware of where they are.  Review your medicines with your doctor. Some medicines can make you feel dizzy. This can increase your chance of falling. Ask your doctor what other things that you can do to help prevent falls. This information is not intended to replace advice given to you by your health care provider. Make sure you discuss any questions you have with your health care provider. Document Released: 08/09/2009 Document Revised: 03/20/2016 Document Reviewed: 11/17/2014 Elsevier Interactive Patient Education  2017 [employer].

## 2020-05-15 NOTE — Progress Notes (Signed)
Subjective:   Sydney Reid is a 79 y.o. female who presents for Medicare Annual (Subsequent) preventive examination.  Review of Systems     Cardiac Risk Factors include: advanced age (>69mn, >>47women);dyslipidemia;family history of premature cardiovascular disease     Objective:    Today's Vitals   05/15/20 0847  PainSc: 6    There is no height or weight on file to calculate BMI.  Advanced Directives 05/15/2020 02/17/2020 08/17/2019 05/16/2019 03/21/2019 04/30/2018 04/24/2017  Does Patient Have a Medical Advance Directive? Yes Yes Yes Yes No Yes Yes  Type of AParamedicof [place] will - HVinegar BendLiving will [place] will  Does patient want to make changes to medical advance directive? No - Patient declined No - Patient declined - - - No - Patient declined No - Patient declined  Copy of [place] Chart? No - copy requested No - copy requested - No - copy requested - No - copy requested No - copy requested  Would patient like information on creating a medical advance directive? - - - - No - Patient declined - -    Current Medications (verified) Outpatient Encounter Medications as of 05/15/2020  Medication Sig  . calcium carbonate (TUMS - DOSED IN MG ELEMENTAL CALCIUM) 500 MG chewable tablet Chew 1 tablet by mouth as needed for indigestion or heartburn.  . Cholecalciferol (VITAMIN D) 50 MCG (2000 UT) CAPS Take 6,000 Units by mouth daily.   .Marland Kitchenetanercept (ENBREL) 50 MG/ML injection Inject 50 mg into the skin every Sunday.   .Marland KitchenHYDROcodone-acetaminophen (NORCO) 5-325 MG tablet Take 1 tablet by mouth at bedtime.  .Marland Kitchenlevothyroxine (SYNTHROID) 88 MCG tablet TAKE 1 TABLET BY MOUTH ONCE DAILY 1/2 HOUR BEFORE  BREAKFAST ON AN EMPTY  STOMACH FOR THYROID  . Red Yeast Rice Extract (RED YEAST RICE PO) Take 2 tablets by mouth daily.   No  facility-administered encounter medications on file as of 05/15/2020.    Allergies (verified) Codeine and Statins   History: Past Medical History:  Diagnosis Date  . H/O total knee replacement 2001   Right  . High cholesterol   . History of tonsillectomy and adenoidectomy 1946  . Psoriatic arthritis ([place] 1990  . Sleep apnea with use of continuous positive airway pressure (CPAP) 2004  . Thyroid disease    Past Surgical History:  Procedure Laterality Date  . FOOT SURGERY     fused arch in left foot  . SPINE SURGERY    . TOTAL KNEE ARTHROPLASTY Left    Family History  Problem Relation Age of Onset  . Heart attack Father   . Heart disease Sister    Social History   Socioeconomic History  . Marital status: Married    Spouse name: Not on file  . Number of children: Not on file  . Years of education: Not on file  . Highest education level: Not on file  Occupational History  . Not on file  Tobacco Use  . Smoking status: Former Smoker    Packs/day: 2.00    Years: 18.00    Pack years: 36.00    Types: Cigarettes    Quit date: 1975    Years since quitting: 46.5  . Smokeless tobacco: Never Used  Vaping Use  . Vaping Use: Never used  Substance and Sexual Activity  . Alcohol use: Yes    Alcohol/week: 1.0 -  2.0 standard drink    Types: 1 - 2 Standard drinks or equivalent per week    Comment: Social drinker, couple times a week  . Drug use: No  . Sexual activity: Not on file  Other Topics Concern  . Not on file  Social History Narrative   Diet? Normal      Do you drink/eat things with caffeine? Yes, 1 mt. Dew per day      Marital status?              m                      What year were you married? 1988      Do you live in a house, apartment, assisted living, condo, trailer, etc.? Townhouse      Is it one or more stories? 2 story      How many persons live in your home? 2      Do you have any pets in your home? (please list) no      Current or past  profession: Is manager      Do you exercise?     No (did it for 50 years)                       Type & how often?      Do you have a living will? yes      Do you have a DNR form?        yes                          If not, do you want to discuss one?      Do you have signed POA/HPOA for forms?       Social Determinants of Health   Financial Resource Strain:   . Difficulty of Paying Living Expenses:   Food Insecurity:   . Worried About Programme researcher, broadcasting/film/video in the Last Year:   . Barista in the Last Year:   Transportation Needs:   . Freight forwarder (Medical):   Marland Kitchen Lack of Transportation (Non-Medical):   Physical Activity:   . Days of Exercise per Week:   . Minutes of Exercise per Session:   Stress:   . Feeling of Stress :   Social Connections:   . Frequency of Communication with Friends and Family:   . Frequency of Social Gatherings with Friends and Family:   . Attends Religious Services:   . Active Member of Clubs or Organizations:   . Attends Banker Meetings:   Marland Kitchen Marital Status:     Tobacco Counseling Counseling given: Not Answered   Clinical Intake:  Pre-visit preparation completed: Yes  Pain : 0-10 Pain Score: 6  Pain Type: Chronic pain Pain Location: Back Pain Orientation: Upper, Lower Pain Radiating Towards: neck and lower back, down left leg Pain Descriptors / Indicators: Aching Pain Onset: More than a month ago Pain Frequency: Constant Pain Relieving Factors: sitting Effect of Pain on Daily Activities: can not walk  Pain Relieving Factors: sitting  BMI - recorded: 24.6 Nutritional Status: BMI of 19-24  Normal Diabetes: No  How often do you need to have someone help you when you read instructions, pamphlets, or other written materials from your doctor or pharmacy?: 1 - Never  Diabetic?no         Activities of Daily  Living In your present state of health, do you have any difficulty performing the following  activities: 05/15/2020  Hearing? N  Vision? N  Difficulty concentrating or making decisions? N  Walking or climbing stairs? Y  Comment back pain  Dressing or bathing? N  Doing errands, shopping? N  Preparing Food and eating ? N  Using the Toilet? N  In the past six months, have you accidently leaked urine? N  Do you have problems with loss of bowel control? N  Managing your Medications? N  Managing your Finances? N  Housekeeping or managing your Housekeeping? N  Some recent data might be hidden    Patient Care Team: [name], NP as PCP - General (Geriatric Medicine) Tomma Rakers, MD as Referring Physician (Ophthalmology)  Indicate any recent Medical Services you may have received from other than [facility] providers in the past year (date may be approximate).     Assessment:   This is a routine wellness examination for Nyjah.  Hearing/Vision screen  Hearing Screening   '125Hz'$  '250Hz'$  '500Hz'$  '1000Hz'$  '2000Hz'$  '3000Hz'$  '4000Hz'$  '6000Hz'$  '8000Hz'$   Right ear:           Left ear:           Comments: Patient states she has no hearing issues  Vision Screening Comments: Patient states that she has some issues with eyes, but sees eye doctor. Sees him once a year. Had eye exam in Feb. 2021  Dietary issues and exercise activities discussed: Current Exercise Habits: Home exercise routine, Type of exercise: strength training/weights;calisthenics;stretching, Time (Minutes): 10, Frequency (Times/Week): 4, Weekly Exercise (Minutes/Week): 40, Exercise limited by: orthopedic condition(s)  Goals    . Patient Stated     Maintain current lifestyle    . Pilaties and tai chi     Patient will start doing tai chi and pilates.      Depression Screen PHQ 2/9 Scores 05/15/2020 10/17/2019 05/16/2019 05/02/2019 04/30/2018 12/25/2017 04/24/2017  PHQ - 2 Score 0 0 0 0 0 0 0    Fall Risk Fall Risk  05/15/2020 02/17/2020 10/17/2019 08/17/2019 05/16/2019  Falls in the past year? 1 0 0 0 0  Number falls in past  yr: 1 0 0 - 0  Injury with Fall? 0 0 0 - 0  Comment - - - - -    Any stairs in or around the home? Yes  If so, are there any without handrails? No  Home free of loose throw rugs in walkways, pet beds, electrical cords, etc? Yes  Adequate lighting in your home to reduce risk of falls? Yes   ASSISTIVE DEVICES UTILIZED TO PREVENT FALLS:  Life alert? No  Use of a cane, walker or w/c? No  Grab bars in the bathroom? Yes  Shower chair or bench in shower? No  Elevated toilet seat or a handicapped toilet? Yes   TIMED UP AND GO:  na Cognitive Function: MMSE - Mini Mental State Exam 05/02/2019 04/30/2018 04/09/2017  Orientation to time '5 5 5  '$ Orientation to Place '5 5 5  '$ Registration '3 3 3  '$ Attention/ Calculation '5 5 5  '$ Recall '3 3 3  '$ Language- name 2 objects '2 2 2  '$ Language- repeat '1 1 1  '$ Language- follow 3 step command '3 3 3  '$ Language- read & follow direction '1 1 1  '$ Write a sentence '1 1 1  '$ Copy design '1 1 1  '$ Total score '30 30 30     '$ 6CIT Screen 05/15/2020  What  Year? 0 points  What month? 0 points  What time? 0 points  Count back from 20 0 points  Months in reverse 0 points  Repeat phrase 0 points  Total Score 0    Immunizations Immunization History  Administered Date(s) Administered  . Influenza, High Dose Seasonal PF 07/20/2019  . Influenza,inj,Quad PF,6+ Mos 07/08/2017  . Influenza-Unspecified 09/13/2012, 11/09/2014, 09/26/2018  . PFIZER SARS-COV-2 Vaccination 11/13/2019, 12/04/2019  . Pneumococcal Conjugate-13 11/23/2014  . Pneumococcal Polysaccharide-23 10/27/2005  . Tdap 10/27/2009  . Zoster Recombinat (Shingrix) 12/12/2018, 04/04/2019    TDAP status: Due, Education has been provided regarding the importance of this vaccine. Advised may receive this vaccine at local pharmacy or Health Dept. Aware to provide a copy of the vaccination record if obtained from local pharmacy or Health Dept. Verbalized acceptance and understanding. Flu Vaccine status: Up to date  Pneumococcal vaccine status: Up to date Covid-19 vaccine status: Completed vaccines  Qualifies for Shingles Vaccine? Yes   Zostavax completed No   Shingrix Completed?: Yes  Screening Tests Health Maintenance  Topic Date Due  . TETANUS/TDAP  10/28/2019  . INFLUENZA VACCINE  05/27/2020  . DEXA SCAN  Completed  . COVID-19 Vaccine  Completed  . PNA vac Low Risk Adult  Completed    Health Maintenance  Health Maintenance Due  Topic Date Due  . TETANUS/TDAP  10/28/2019    Colorectal cancer screening: No longer required.  Mammogram status: No longer required.  Bone Density status: Completed 2019. Results reflect: Bone density results: OSTEOPENIA. Repeat every 2 years.  Lung Cancer Screening: (Low Dose CT Chest recommended if Age 23-80 years, 30 pack-year currently smoking OR have quit w/in 15years.) does not qualify.   Lung Cancer Screening Referral: na  Additional Screening:  Hepatitis C Screening: does not qualify; Completed na  Vision Screening: Recommended annual ophthalmology exams for early detection of glaucoma and other disorders of the eye. Is the patient up to date with their annual eye exam?  Yes  Who is the provider or what is the name of the office in which the patient attends annual eye exams? Dr Isaiah Blakes, [facility] If pt is not established with a provider, would they like to be referred to a provider to establish care? No .   Dental Screening: Recommended annual dental exams for proper oral hygiene  Community Resource Referral / Chronic Care Management: CRR required this visit?  No   CCM required this visit?  No      Plan:     I have personally reviewed and noted the following in the patient's chart:   . Medical and social history . Use of alcohol, tobacco or illicit drugs  . Current medications and supplements . Functional ability and status . Nutritional status . Physical activity . Advanced directives . List of other physicians . Hospitalizations,  surgeries, and ER visits in previous 12 months . Vitals . Screenings to include cognitive, depression, and falls . Referrals and appointments  In addition, I have reviewed and discussed with patient certain preventive protocols, quality metrics, and best practice recommendations. A written personalized care plan for preventive services as well as general preventive health recommendations were provided to patient.     [name], NP   05/15/2020

## 2020-05-15 NOTE — Telephone Encounter (Signed)
Ms. Sydney Reid, Sydney Reid are scheduled for a virtual visit with your provider today.    Just as we do with appointments in the office, we must obtain your consent to participate.  Your consent will be active for this visit and any virtual visit you may have with one of our providers in the next 365 days.    If you have a MyChart account, I can also send a copy of this consent to you electronically.  All virtual visits are billed to your insurance company just like a traditional visit in the office.  As this is a virtual visit, video technology does not allow for your provider to perform a traditional examination.  This may limit your provider's ability to fully assess your condition.  If your provider identifies any concerns that need to be evaluated in person or the need to arrange testing such as labs, EKG, etc, we will make arrangements to do so.    Although advances in technology are sophisticated, we cannot ensure that it will always work on either your end or our end.  If the connection with a video visit is poor, we may have to switch to a telephone visit.  With either a video or telephone visit, we are not always able to ensure that we have a secure connection.   I need to obtain your verbal consent now.   Are you willing to proceed with your visit today?   Sydney Reid has provided verbal consent on 05/15/2020 for a virtual visit (video or telephone).   Carroll Kinds, CMA 05/15/2020  8:40 AM

## 2020-05-15 NOTE — Progress Notes (Signed)
This service is provided via telemedicine  No vital signs collected/recorded due to the encounter was a telemedicine visit.   Location of patient (ex: home, work):  Home  Patient consents to a telephone visit:  Yes, see telephone encounter dated 05/15/2020  Location of the provider (ex: office, home):  [place]  Name of any referring provider:  N/A  Names of all persons participating in the telemedicine service and their role in the encounter:  Sherrie Mustache, Nurse Practitioner, Carroll Kinds, CMA, and patient.   Time spent on call:  7 minutes with medical assistant

## 2020-05-31 ENCOUNTER — Other Ambulatory Visit: Payer: Self-pay | Admitting: Nurse Practitioner

## 2020-05-31 DIAGNOSIS — E2839 Other primary ovarian failure: Secondary | ICD-10-CM

## 2020-06-11 ENCOUNTER — Other Ambulatory Visit: Payer: Self-pay | Admitting: *Deleted

## 2020-06-11 DIAGNOSIS — L4052 Psoriatic arthritis mutilans: Secondary | ICD-10-CM

## 2020-06-11 DIAGNOSIS — G894 Chronic pain syndrome: Secondary | ICD-10-CM

## 2020-06-11 MED ORDER — HYDROCODONE-ACETAMINOPHEN 5-325 MG PO TABS: 1.0000 | ORAL_TABLET | Freq: Every day | ORAL | 0 refills | Status: DC

## 2020-06-11 NOTE — Telephone Encounter (Signed)
Patient requested refill Contract updated LR: 05/10/2020 Pended Rx and sent to [facility] for approval.

## 2020-06-21 DIAGNOSIS — Z23 Encounter for immunization: Secondary | ICD-10-CM | POA: Diagnosis not present

## 2020-07-04 DIAGNOSIS — Z96651 Presence of right artificial knee joint: Secondary | ICD-10-CM | POA: Diagnosis not present

## 2020-07-04 DIAGNOSIS — M25561 Pain in right knee: Secondary | ICD-10-CM | POA: Insufficient documentation

## 2020-07-06 ENCOUNTER — Other Ambulatory Visit: Payer: Self-pay | Admitting: *Deleted

## 2020-07-06 DIAGNOSIS — L4052 Psoriatic arthritis mutilans: Secondary | ICD-10-CM

## 2020-07-06 DIAGNOSIS — G894 Chronic pain syndrome: Secondary | ICD-10-CM

## 2020-07-06 MED ORDER — HYDROCODONE-ACETAMINOPHEN 5-325 MG PO TABS: 1.0000 | ORAL_TABLET | Freq: Every day | ORAL | 0 refills | Status: DC

## 2020-07-06 NOTE — Telephone Encounter (Signed)
Patient requested refill Epic LR: 06/11/2020 Contract on file  Pended Rx and sent to [facility] for approval.  Patient stated that she is going out of town on Monday thru Friday. She will run out of medication while on Vacation and requesting a refill to pick up to take with her.

## 2020-07-23 DIAGNOSIS — M79675 Pain in left toe(s): Secondary | ICD-10-CM | POA: Diagnosis not present

## 2020-07-23 DIAGNOSIS — M7742 Metatarsalgia, left foot: Secondary | ICD-10-CM | POA: Diagnosis not present

## 2020-07-23 DIAGNOSIS — M2041 Other hammer toe(s) (acquired), right foot: Secondary | ICD-10-CM | POA: Diagnosis not present

## 2020-07-23 DIAGNOSIS — M79674 Pain in right toe(s): Secondary | ICD-10-CM | POA: Diagnosis not present

## 2020-07-23 DIAGNOSIS — M7741 Metatarsalgia, right foot: Secondary | ICD-10-CM | POA: Diagnosis not present

## 2020-07-23 DIAGNOSIS — M2042 Other hammer toe(s) (acquired), left foot: Secondary | ICD-10-CM | POA: Diagnosis not present

## 2020-07-25 DIAGNOSIS — M5136 Other intervertebral disc degeneration, lumbar region: Secondary | ICD-10-CM | POA: Diagnosis not present

## 2020-07-25 DIAGNOSIS — Z6826 Body mass index (BMI) 26.0-26.9, adult: Secondary | ICD-10-CM | POA: Diagnosis not present

## 2020-07-25 DIAGNOSIS — G8929 Other chronic pain: Secondary | ICD-10-CM | POA: Diagnosis not present

## 2020-07-25 DIAGNOSIS — M48061 Spinal stenosis, lumbar region without neurogenic claudication: Secondary | ICD-10-CM | POA: Diagnosis not present

## 2020-07-25 DIAGNOSIS — M4316 Spondylolisthesis, lumbar region: Secondary | ICD-10-CM | POA: Diagnosis not present

## 2020-07-25 DIAGNOSIS — M47816 Spondylosis without myelopathy or radiculopathy, lumbar region: Secondary | ICD-10-CM | POA: Diagnosis not present

## 2020-07-25 DIAGNOSIS — R030 Elevated blood-pressure reading, without diagnosis of hypertension: Secondary | ICD-10-CM | POA: Diagnosis not present

## 2020-07-25 DIAGNOSIS — M5442 Lumbago with sciatica, left side: Secondary | ICD-10-CM | POA: Diagnosis not present

## 2020-07-25 DIAGNOSIS — M4180 Other forms of scoliosis, site unspecified: Secondary | ICD-10-CM | POA: Diagnosis not present

## 2020-08-06 ENCOUNTER — Other Ambulatory Visit: Payer: Medicare Other

## 2020-08-13 ENCOUNTER — Other Ambulatory Visit: Payer: Self-pay | Admitting: *Deleted

## 2020-08-13 DIAGNOSIS — G894 Chronic pain syndrome: Secondary | ICD-10-CM

## 2020-08-13 DIAGNOSIS — L4052 Psoriatic arthritis mutilans: Secondary | ICD-10-CM

## 2020-08-13 MED ORDER — HYDROCODONE-ACETAMINOPHEN 5-325 MG PO TABS: 1.0000 | ORAL_TABLET | Freq: Every day | ORAL | 0 refills | Status: DC

## 2020-08-13 NOTE — Telephone Encounter (Signed)
Patient requested refill Epic LR: 07/06/2020 Contract on [employer] [facility] and sent to [place] for approval.

## 2020-08-20 ENCOUNTER — Other Ambulatory Visit: Payer: Medicare Other

## 2020-08-20 ENCOUNTER — Other Ambulatory Visit: Payer: Self-pay

## 2020-08-20 DIAGNOSIS — E559 Vitamin D deficiency, unspecified: Secondary | ICD-10-CM | POA: Diagnosis not present

## 2020-08-20 DIAGNOSIS — E782 Mixed hyperlipidemia: Secondary | ICD-10-CM

## 2020-08-20 DIAGNOSIS — L4052 Psoriatic arthritis mutilans: Secondary | ICD-10-CM

## 2020-08-21 LAB — COMPLETE METABOLIC PANEL WITH GFR
AG Ratio: 1.2 (calc) (ref 1.0–2.5)
ALT: 15 U/L (ref 6–29)
AST: 17 U/L (ref 10–35)
Albumin: 4.2 g/dL (ref 3.6–5.1)
Alkaline phosphatase (APISO): 64 U/L (ref 37–153)
BUN/Creatinine Ratio: 16 (calc) (ref 6–22)
BUN: 18 mg/dL (ref 7–25)
CO2: 25 mmol/L (ref 20–32)
Calcium: 9.4 mg/dL (ref 8.6–10.4)
Chloride: 107 mmol/L (ref 98–110)
Creat: 1.12 mg/dL — ABNORMAL HIGH (ref 0.60–0.88)
GFR, Est African American: 54 mL/min/{1.73_m2} — ABNORMAL LOW (ref >=60)
GFR, Est Non African American: 46 mL/min/{1.73_m2} — ABNORMAL LOW (ref >=60)
Globulin: 3.4 g/dL (calc) (ref 1.9–3.7)
Glucose, Bld: 91 mg/dL (ref 65–99)
Potassium: 4.4 mmol/L (ref 3.5–5.3)
Sodium: 139 mmol/L (ref 135–146)
Total Bilirubin: 0.8 mg/dL (ref 0.2–1.2)
Total Protein: 7.6 g/dL (ref 6.1–8.1)

## 2020-08-21 LAB — LIPID PANEL
Cholesterol: 232 mg/dL — ABNORMAL HIGH (ref <200)
HDL: 47 mg/dL — ABNORMAL LOW (ref >=50)
LDL Cholesterol (Calc): 153 mg/dL (calc) — ABNORMAL HIGH
Non-HDL Cholesterol (Calc): 185 mg/dL (calc) — ABNORMAL HIGH (ref <130)
Total CHOL/HDL Ratio: 4.9 (calc) (ref <5.0)
Triglycerides: 186 mg/dL — ABNORMAL HIGH (ref <150)

## 2020-08-21 LAB — CBC WITH DIFFERENTIAL/PLATELET
Absolute Monocytes: 625 cells/uL (ref 200–950)
Basophils Absolute: 10 cells/uL (ref 0–200)
Basophils Relative: 0.2 %
Eosinophils Absolute: 0 cells/uL — ABNORMAL LOW (ref 15–500)
Eosinophils Relative: 0 %
HCT: 48.1 % — ABNORMAL HIGH (ref 35.0–45.0)
Hemoglobin: 15.2 g/dL (ref 11.7–15.5)
Lymphs Abs: 1845 cells/uL (ref 850–3900)
MCH: 26.3 pg — ABNORMAL LOW (ref 27.0–33.0)
MCHC: 31.6 g/dL — ABNORMAL LOW (ref 32.0–36.0)
MCV: 83.4 fL (ref 80.0–100.0)
MPV: 10.6 fL (ref 7.5–12.5)
Monocytes Relative: 12.5 %
Neutro Abs: 2520 cells/uL (ref 1500–7800)
Neutrophils Relative %: 50.4 %
Platelets: 209 10*3/uL (ref 140–400)
RBC: 5.77 10*6/uL — ABNORMAL HIGH (ref 3.80–5.10)
RDW: 13.6 % (ref 11.0–15.0)
Total Lymphocyte: 36.9 %
WBC: 5 10*3/uL (ref 3.8–10.8)

## 2020-08-21 LAB — VITAMIN D 25 HYDROXY (VIT D DEFICIENCY, FRACTURES): Vit D, 25-Hydroxy: 59 ng/mL (ref 30–100)

## 2020-09-10 ENCOUNTER — Ambulatory Visit (INDEPENDENT_AMBULATORY_CARE_PROVIDER_SITE_OTHER): Payer: Medicare Other | Admitting: Nurse Practitioner

## 2020-09-10 ENCOUNTER — Encounter: Payer: Self-pay | Admitting: Nurse Practitioner

## 2020-09-10 ENCOUNTER — Other Ambulatory Visit: Payer: Self-pay

## 2020-09-10 VITALS — BP 128/88 | HR 85 | Temp 96.4°F | Ht 65.0 in | Wt 156.0 lb

## 2020-09-10 DIAGNOSIS — Q845 Enlarged and hypertrophic nails: Secondary | ICD-10-CM

## 2020-09-10 DIAGNOSIS — L4052 Psoriatic arthritis mutilans: Secondary | ICD-10-CM | POA: Diagnosis not present

## 2020-09-10 DIAGNOSIS — L84 Corns and callosities: Secondary | ICD-10-CM | POA: Diagnosis not present

## 2020-09-10 DIAGNOSIS — E034 Atrophy of thyroid (acquired): Secondary | ICD-10-CM

## 2020-09-10 DIAGNOSIS — E782 Mixed hyperlipidemia: Secondary | ICD-10-CM | POA: Diagnosis not present

## 2020-09-10 DIAGNOSIS — R42 Dizziness and giddiness: Secondary | ICD-10-CM

## 2020-09-10 DIAGNOSIS — Z23 Encounter for immunization: Secondary | ICD-10-CM | POA: Diagnosis not present

## 2020-09-10 NOTE — Progress Notes (Signed)
Careteam: Patient Care Team: [name], NP as PCP - General (Geriatric Medicine) Tomma Rakers, MD as Referring Physician (Ophthalmology)  PLACE OF SERVICE:  [place] Directive information Does Patient Have a Medical Advance Directive?: Yes, Type of Advance Directive: [place];Living will, Does patient want to make changes to medical advance directive?: No - Patient declined  Allergies  Allergen Reactions  . Codeine Nausea And Vomiting    Weakness and dysequilibrium  . Statins Hives    Myalgias     Chief Complaint  Patient presents with  . Medical Management of Chronic Issues    6 month follow-up, [facility]. Discuss need for TD/Tdap.      HPI: Patient is a 79 y.o. female for routine follow  Hyperlipidemia- Has company from oct-feb, hard to keep cholesterol under control with that.  Continues to have prescription for zetia but did not feel well on medication.  Has a hard time with dietary modifications with doing a lot of cooking. Eats a lot of cheese. Eats a slice of ham and a piece of cheese for lunch. Does cheese trays and dips.   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   Calluses and thickened toe nails which she has a hard time managing due to her destructive psoriatic arthritis.   Husband is stage 4 kidney disease with myalgias gravsis  Review of Systems:  Review of Systems  Constitutional: Negative for chills, fever and weight loss.  HENT: Negative for tinnitus.   Respiratory: Negative for cough, sputum production and shortness of breath.   Cardiovascular: Negative for chest pain, palpitations and leg swelling.  Gastrointestinal: Negative for abdominal pain, constipation, diarrhea and heartburn.  Genitourinary: Negative for dysuria, frequency and urgency.  Musculoskeletal: Positive for falls and joint pain. Negative for back pain and myalgias.  Skin:  Negative.   Neurological: Positive for dizziness. Negative for headaches.  Psychiatric/Behavioral: Negative for depression and memory loss. The patient does not have insomnia.     Past Medical History:  Diagnosis Date  . H/O total knee replacement 2001   Right  . High cholesterol   . History of tonsillectomy and adenoidectomy 1946  . Psoriatic arthritis ([place]) 1990  . Sleep apnea with use of continuous positive airway pressure (CPAP) 2004  . Thyroid disease    Past Surgical History:  Procedure Laterality Date  . FOOT SURGERY     fused arch in left foot  . SPINE SURGERY    . TOTAL KNEE ARTHROPLASTY Left    Social History:   reports that she quit smoking about 46 years ago. Her smoking use included cigarettes. She has a 36.00 pack-year smoking history. She has never used smokeless tobacco. She reports current alcohol use of about 1.0 - 2.0 standard drink of alcohol per week. She reports that she does not use drugs.  Family History  Problem Relation Age of Onset  . Heart attack Father   . Heart disease Sister     Medications: Patient's Medications  New Prescriptions   No medications on file  Previous Medications   CALCIUM CARBONATE (TUMS - DOSED IN MG ELEMENTAL CALCIUM) 500 MG CHEWABLE TABLET    Chew 1 tablet by mouth as needed for indigestion or heartburn.   CHOLECALCIFEROL (VITAMIN D) 50 MCG (2000 UT) CAPS    Take 6,000 Units by mouth daily.    ETANERCEPT (ENBREL) 50 MG/ML INJECTION    Inject 50 mg into the  skin every Sunday.    HYDROCODONE-ACETAMINOPHEN (NORCO) 5-325 MG TABLET    Take 1 tablet by mouth at bedtime.   LEVOTHYROXINE (SYNTHROID) 88 MCG TABLET    TAKE 1 TABLET BY MOUTH ONCE DAILY 1/2 HOUR BEFORE  BREAKFAST ON AN EMPTY  STOMACH FOR THYROID   NIACIN PO    Take 2 tablets by mouth daily.   RED YEAST RICE EXTRACT (RED YEAST RICE PO)    Take 2 tablets by mouth daily.  Modified Medications   No medications on file  Discontinued Medications   No medications on file     Physical Exam:  Vitals:   09/10/20 1423  BP: 128/88  Pulse: 85  Temp: (!) 96.4 F (35.8 C)  TempSrc: Temporal  SpO2: 99%  Weight: 156 lb (70.8 kg)  Height: 5\' 5"  (1.651 m)   Body mass index is 25.96 kg/m. Wt Readings from Last 3 Encounters:  09/10/20 156 lb (70.8 kg)  02/17/20 157 lb 12.8 oz (71.6 kg)  10/17/19 156 lb (70.8 kg)    Physical Exam Constitutional:      General: She is not in acute distress.    Appearance: She is well-developed. She is not diaphoretic.  HENT:     Head: Normocephalic and atraumatic.     Mouth/Throat:     Pharynx: No oropharyngeal exudate.  Eyes:     Conjunctiva/sclera: Conjunctivae normal.     Pupils: Pupils are equal, round, and reactive to light.  Cardiovascular:     Rate and Rhythm: Normal rate and regular rhythm.     Pulses:          Dorsalis pedis pulses are 1+ on the right side and 1+ on the left side.     Heart sounds: Normal heart sounds.  Pulmonary:     Effort: Pulmonary effort is normal.     Breath sounds: Normal breath sounds.  Abdominal:     General: Bowel sounds are normal.     Palpations: Abdomen is soft.  Musculoskeletal:        General: No tenderness.     Cervical back: Normal range of motion and neck supple.  Feet:     Right foot:     Toenail Condition: Right toenails are abnormally thick and long.     Left foot:     Skin integrity: Callus present.     Toenail Condition: Left toenails are abnormally thick and long.  Skin:    General: Skin is warm and dry.  Neurological:     Mental Status: She is alert and oriented to person, place, and time.  Psychiatric:        Mood and Affect: Mood normal.        Behavior: Behavior normal.     Labs reviewed: Basic Metabolic Panel: Recent Labs    02/14/20 0949 08/20/20 0952  NA 139 139  K 4.6 4.4  CL 105 107  CO2 24 25  GLUCOSE 88 91  BUN 19 18  CREATININE 1.12* 1.12*  CALCIUM 9.5 9.4  TSH 0.91  --    Liver Function Tests: Recent Labs    02/14/20 0949  08/20/20 0952  AST 19 17  ALT 15 15  BILITOT 0.5 0.8  PROT 7.5 7.6   No results for input(s): LIPASE, AMYLASE in the last 8760 hours. No results for input(s): AMMONIA in the last 8760 hours. CBC: Recent Labs    02/14/20 0949 08/20/20 0952  WBC 5.9 5.0  NEUTROABS 3,044 2,520  HGB 15.0 15.2  HCT 46.9* 48.1*  MCV 83.5 83.4  PLT 235 209   Lipid Panel: Recent Labs    08/20/20 0952  CHOL 232*  HDL 47*  LDLCALC 153*  TRIG 186*  CHOLHDL 4.9   TSH: Recent Labs    02/14/20 0949  TSH 0.91   A1C: No results found for: HGBA1C   Assessment/Plan 1. Hypothyroidism due to acquired atrophy of thyroid TSH at goal on synthroid 88 mcg   2. 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  Continues on enbrel weekly with hydrocodone- apap  - Ambulatory referral to Podiatry  3. Callus On bottom of feet, hard to manage callus and toenails with arthritis.  - Ambulatory referral to Podiatry for further evaluation and treatment.   4. Enlarged and hypertrophic nails - Ambulatory referral to Podiatry  5. Mixed hyperlipidemia -encouraged dietary modifications. She loves to cook and it brings her joy. Does not wish to be on lipid lowering medication at this time.   6. Dizziness -ongoing and stable, continues lifestyle modifications.   7. Need for influenza vaccination - Flu Vaccine QUAD High Dose(Fluad)  Next appt: 6 months, labs p Price [place] K. [place], [facility] [phone]

## 2020-09-10 NOTE — Patient Instructions (Addendum)
Tdap due- to get at local pharmacy  To try zetia by mouth daily (can take at bedtime)   Heart-Healthy Eating Plan Many factors influence your heart (coronary) health, including eating and exercise habits. Coronary risk increases with abnormal blood fat (lipid) levels. Heart-healthy meal planning includes limiting unhealthy fats, increasing healthy fats, and making other diet and lifestyle changes. What is my plan? Your health care provider may recommend that you:  Limit your fat intake to _________% or less of your total calories each day.  Limit your saturated fat intake to _________% or less of your total calories each day.  Limit the amount of cholesterol in your diet to less than _________ mg per day. What are tips for following this plan? Cooking Cook foods using methods other than frying. Baking, boiling, grilling, and broiling are all good options. Other ways to reduce fat include:  Removing the skin from poultry.  Removing all visible fats from meats.  Steaming vegetables in water or broth. Meal planning   At meals, imagine dividing your plate into fourths: ? Fill one-half of your plate with vegetables and green salads. ? Fill one-fourth of your plate with whole grains. ? Fill one-fourth of your plate with lean protein foods.  Eat 4-5 servings of vegetables per day. One serving equals 1 cup raw or cooked vegetable, or 2 cups raw leafy greens.  Eat 4-5 servings of fruit per day. One serving equals 1 medium whole fruit,  cup dried fruit,  cup fresh, frozen, or canned fruit, or  cup 100% fruit juice.  Eat more foods that contain soluble fiber. Examples include apples, broccoli, carrots, beans, peas, and barley. Aim to get 25-30 g of fiber per day.  Increase your consumption of legumes, nuts, and seeds to 4-5 servings per week. One serving of dried beans or legumes equals  cup cooked, 1 serving of nuts is  cup, and 1 serving of seeds equals 1 tablespoon. Fats  Choose  healthy fats more often. Choose monounsaturated and polyunsaturated fats, such as olive and canola oils, flaxseeds, walnuts, almonds, and seeds.  Eat more omega-3 fats. Choose salmon, mackerel, sardines, tuna, flaxseed oil, and ground flaxseeds. Aim to eat fish at least 2 times each week.  Check food labels carefully to identify foods with trans fats or high amounts of saturated fat.  Limit saturated fats. These are found in animal products, such as meats, butter, and cream. Plant sources of saturated fats include palm oil, palm kernel oil, and coconut oil.  Avoid foods with partially hydrogenated oils in them. These contain trans fats. Examples are stick margarine, some tub margarines, cookies, crackers, and other baked goods.  Avoid fried foods. General information  Eat more home-cooked food and less restaurant, buffet, and fast food.  Limit or avoid alcohol.  Limit foods that are high in starch and sugar.  Lose weight if you are overweight. Losing just 5-10% of your body weight can help your overall health and prevent diseases such as diabetes and heart disease.  Monitor your salt (sodium) intake, especially if you have high blood pressure. Talk with your health care provider about your sodium intake.  Try to incorporate more vegetarian meals weekly. What foods can I eat? Fruits All fresh, canned (in natural juice), or frozen fruits. Vegetables Fresh or frozen vegetables (raw, steamed, roasted, or grilled). Green salads. Grains Most grains. Choose whole wheat and whole grains most of the time. Rice and pasta, including brown rice and pastas made with whole wheat. Meats  and other proteins Lean, well-trimmed beef, veal, pork, and lamb. Chicken and [place] without skin. All fish and shellfish. Wild duck, rabbit, pheasant, and venison. Egg whites or low-cholesterol egg substitutes. Dried beans, peas, lentils, and tofu. Seeds and most nuts. Dairy Low-fat or nonfat cheeses, including  ricotta and mozzarella. Skim or 1% milk (liquid, powdered, or evaporated). Buttermilk made with low-fat milk. Nonfat or low-fat yogurt. Fats and oils Non-hydrogenated (trans-free) margarines. Vegetable oils, including soybean, sesame, sunflower, olive, peanut, safflower, corn, canola, and cottonseed. Salad dressings or mayonnaise made with a vegetable oil. Beverages Water (mineral or sparkling). Coffee and tea. Diet carbonated beverages. Sweets and desserts Sherbet, gelatin, and fruit ice. Small amounts of dark chocolate. Limit all sweets and desserts. Seasonings and condiments All seasonings and condiments. The items listed above may not be a complete list of foods and beverages you can eat. Contact a dietitian for more options. What foods are not recommended? Fruits Canned fruit in heavy syrup. Fruit in cream or butter sauce. Fried fruit. Limit coconut. Vegetables Vegetables cooked in cheese, cream, or butter sauce. Fried vegetables. Grains Breads made with saturated or trans fats, oils, or whole milk. Croissants. Sweet rolls. Donuts. High-fat crackers, such as cheese crackers. Meats and other proteins Fatty meats, such as hot dogs, ribs, sausage, bacon, rib-eye roast or steak. High-fat deli meats, such as salami and bologna. Caviar. Domestic duck and goose. Organ meats, such as liver. Dairy Cream, sour cream, cream cheese, and creamed cottage cheese. Whole milk cheeses. Whole or 2% milk (liquid, evaporated, or condensed). Whole buttermilk. Cream sauce or high-fat cheese sauce. Whole-milk yogurt. Fats and oils Meat fat, or shortening. Cocoa butter, hydrogenated oils, palm oil, coconut oil, palm kernel oil. Solid fats and shortenings, including bacon fat, salt pork, lard, and butter. Nondairy cream substitutes. Salad dressings with cheese or sour cream. Beverages Regular sodas and any drinks with added sugar. Sweets and desserts Frosting. Pudding. Cookies. Cakes. Pies. Milk chocolate or  white chocolate. Buttered syrups. Full-fat ice cream or ice cream drinks. The items listed above may not be a complete list of foods and beverages to avoid. Contact a dietitian for more information. Summary  Heart-healthy meal planning includes limiting unhealthy fats, increasing healthy fats, and making other diet and lifestyle changes.  Lose weight if you are overweight. Losing just 5-10% of your body weight can help your overall health and prevent diseases such as diabetes and heart disease.  Focus on eating a balance of foods, including fruits and vegetables, low-fat or nonfat dairy, lean protein, nuts and legumes, whole grains, and heart-healthy oils and fats. This information is not intended to replace advice given to you by your health care provider. Make sure you discuss any questions you have with your health care provider. Document Revised: 11/20/2017 Document Reviewed: 11/20/2017 Elsevier Patient Education  2020 [employer].

## 2020-09-12 ENCOUNTER — Other Ambulatory Visit: Payer: Self-pay | Admitting: *Deleted

## 2020-09-12 DIAGNOSIS — L4052 Psoriatic arthritis mutilans: Secondary | ICD-10-CM

## 2020-09-12 DIAGNOSIS — G894 Chronic pain syndrome: Secondary | ICD-10-CM

## 2020-09-12 MED ORDER — HYDROCODONE-ACETAMINOPHEN 5-325 MG PO TABS: 1.0000 | ORAL_TABLET | Freq: Every day | ORAL | 0 refills | Status: DC

## 2020-09-12 NOTE — Telephone Encounter (Signed)
Patient requested refill Epic LR: 08/13/2020 Contract on File (note placed on upcoming appointment to update) Pended Rx and sent to [facility] for approval.

## 2020-10-09 DIAGNOSIS — I70213 Atherosclerosis of native arteries of extremities with intermittent claudication, bilateral legs: Secondary | ICD-10-CM | POA: Diagnosis not present

## 2020-10-09 DIAGNOSIS — L603 Nail dystrophy: Secondary | ICD-10-CM | POA: Diagnosis not present

## 2020-10-09 DIAGNOSIS — M21961 Unspecified acquired deformity of right lower leg: Secondary | ICD-10-CM | POA: Diagnosis not present

## 2020-10-09 DIAGNOSIS — L84 Corns and callosities: Secondary | ICD-10-CM | POA: Diagnosis not present

## 2020-10-11 ENCOUNTER — Other Ambulatory Visit: Payer: Self-pay | Admitting: *Deleted

## 2020-10-11 DIAGNOSIS — G894 Chronic pain syndrome: Secondary | ICD-10-CM

## 2020-10-11 DIAGNOSIS — L4052 Psoriatic arthritis mutilans: Secondary | ICD-10-CM

## 2020-10-11 MED ORDER — HYDROCODONE-ACETAMINOPHEN 5-325 MG PO TABS: 1.0000 | ORAL_TABLET | Freq: Every day | ORAL | 0 refills | Status: DC

## 2020-10-11 NOTE — Telephone Encounter (Signed)
Patient requested refill Epic LR: 09/12/2020 Contract on [employer] [facility] and sent to [facility] for approval.

## 2020-11-08 ENCOUNTER — Other Ambulatory Visit: Payer: Self-pay | Admitting: *Deleted

## 2020-11-08 DIAGNOSIS — G894 Chronic pain syndrome: Secondary | ICD-10-CM

## 2020-11-08 DIAGNOSIS — L4052 Psoriatic arthritis mutilans: Secondary | ICD-10-CM

## 2020-11-08 NOTE — Telephone Encounter (Signed)
Patient called requesting refill on Hydrocodone to be sent to pharmacy. Tomorrow.  Will Pend Medication and sent to [facility] for approval tomorrow.   Epic LR: 10/11/2020.

## 2020-11-09 MED ORDER — HYDROCODONE-ACETAMINOPHEN 5-325 MG PO TABS: 1.0000 | ORAL_TABLET | Freq: Every day | ORAL | 0 refills | Status: DC

## 2020-11-09 NOTE — Telephone Encounter (Signed)
Rx Pended and sent to [facility] for approval.  Epic LR: 10/11/2020 Contract needs to be updated, added to upcoming appointment.

## 2020-11-13 DIAGNOSIS — Z20822 Contact with and (suspected) exposure to covid-19: Secondary | ICD-10-CM | POA: Diagnosis not present

## 2020-11-15 DIAGNOSIS — Z79899 Other long term (current) drug therapy: Secondary | ICD-10-CM | POA: Diagnosis not present

## 2020-11-15 DIAGNOSIS — M2550 Pain in unspecified joint: Secondary | ICD-10-CM | POA: Diagnosis not present

## 2020-11-15 DIAGNOSIS — L409 Psoriasis, unspecified: Secondary | ICD-10-CM | POA: Diagnosis not present

## 2020-11-15 DIAGNOSIS — M8580 Other specified disorders of bone density and structure, unspecified site: Secondary | ICD-10-CM | POA: Diagnosis not present

## 2020-11-15 DIAGNOSIS — M5459 Other low back pain: Secondary | ICD-10-CM | POA: Diagnosis not present

## 2020-11-15 DIAGNOSIS — L4050 Arthropathic psoriasis, unspecified: Secondary | ICD-10-CM | POA: Diagnosis not present

## 2020-11-15 DIAGNOSIS — M1990 Unspecified osteoarthritis, unspecified site: Secondary | ICD-10-CM | POA: Diagnosis not present

## 2020-11-21 ENCOUNTER — Other Ambulatory Visit: Payer: Self-pay | Admitting: Nurse Practitioner

## 2020-11-21 DIAGNOSIS — E782 Mixed hyperlipidemia: Secondary | ICD-10-CM

## 2020-11-22 ENCOUNTER — Telehealth: Payer: Self-pay

## 2020-11-22 MED ORDER — EZETIMIBE 10 MG PO TABS: 10.0000 mg | ORAL_TABLET | Freq: Every day | ORAL | 1 refills | Status: DC

## 2020-11-22 NOTE — Telephone Encounter (Signed)
RX sent

## 2020-11-22 NOTE — Telephone Encounter (Signed)
Incoming call received in clincal intake from patient requesting a refill on Zetia. Patient states Sydney Reid recommended zetia for her last year and she opted not to take at the time. Patient had labs in October and since then started taking medication  Zetia 10 mg, 1 by mouth daily  Patient is asking for a refill to [facility]. I informed patient refill was denied because it is not on her active medication list  Please advise if ok to add and refill  Patient has pending lab appointment in May 2022

## 2020-11-22 NOTE — Telephone Encounter (Signed)
Okay to add and refill. Thank you!

## 2020-12-12 ENCOUNTER — Other Ambulatory Visit: Payer: Self-pay | Admitting: *Deleted

## 2020-12-12 DIAGNOSIS — G894 Chronic pain syndrome: Secondary | ICD-10-CM

## 2020-12-12 DIAGNOSIS — L4052 Psoriatic arthritis mutilans: Secondary | ICD-10-CM

## 2020-12-12 MED ORDER — HYDROCODONE-ACETAMINOPHEN 5-325 MG PO TABS
1.0000 | ORAL_TABLET | Freq: Every day | ORAL | 0 refills | Status: DC
Start: 2020-12-12 — End: 2021-01-09

## 2020-12-12 NOTE — Telephone Encounter (Signed)
Patient requested refill.  Contract on file but needs updated, note is added to upcoming appointment.  Epic LR: 11/09/2020 Pended Rx and sent to [facility] for approval.

## 2021-01-01 DIAGNOSIS — L84 Corns and callosities: Secondary | ICD-10-CM | POA: Diagnosis not present

## 2021-01-01 DIAGNOSIS — L603 Nail dystrophy: Secondary | ICD-10-CM | POA: Diagnosis not present

## 2021-01-01 DIAGNOSIS — I70213 Atherosclerosis of native arteries of extremities with intermittent claudication, bilateral legs: Secondary | ICD-10-CM | POA: Diagnosis not present

## 2021-01-01 DIAGNOSIS — M21961 Unspecified acquired deformity of right lower leg: Secondary | ICD-10-CM | POA: Diagnosis not present

## 2021-01-02 ENCOUNTER — Telehealth: Payer: Self-pay

## 2021-01-02 ENCOUNTER — Encounter: Payer: Self-pay | Admitting: Nurse Practitioner

## 2021-01-02 DIAGNOSIS — M8580 Other specified disorders of bone density and structure, unspecified site: Secondary | ICD-10-CM | POA: Insufficient documentation

## 2021-01-02 NOTE — Telephone Encounter (Signed)
Called and left message for patient to call for bone density results. Osteopenia noted on bone density. She is already on vit D, would recommend adding calcium 600 mg by mouth twice daily to promote bone health and weight bearing exercises.

## 2021-01-09 ENCOUNTER — Other Ambulatory Visit: Payer: Self-pay

## 2021-01-09 DIAGNOSIS — L4052 Psoriatic arthritis mutilans: Secondary | ICD-10-CM

## 2021-01-09 DIAGNOSIS — G894 Chronic pain syndrome: Secondary | ICD-10-CM

## 2021-01-09 NOTE — Telephone Encounter (Signed)
Patient left voicemail on Clinical intake voicemail requesting refill of Hydrocodone 5/325 mg tablet one at bedtime, to be filled on Friday 01/11/2021.  Last refill according to med list was 12/12/2020. Medication pended and routed to Sherrie Mustache, NP

## 2021-01-10 MED ORDER — HYDROCODONE-ACETAMINOPHEN 5-325 MG PO TABS: 1.0000 | ORAL_TABLET | Freq: Every day | ORAL | 0 refills | Status: DC

## 2021-01-10 NOTE — Telephone Encounter (Signed)
Patient called back.  Notified of results/message.  Patient stated that her Calcium levels are at the highest levels per her Rheumatologist and she DOES NOT want to take the additional Calcium.

## 2021-01-10 NOTE — Telephone Encounter (Signed)
Agree, if calcium levels are up only take Vit d.

## 2021-01-14 MED ORDER — HYDROCODONE-ACETAMINOPHEN 5-325 MG PO TABS: 1.0000 | ORAL_TABLET | Freq: Every day | ORAL | 0 refills | Status: DC

## 2021-01-14 NOTE — Addendum Note (Signed)
Addended by: Rafael Bihari A on: 01/14/2021 09:37 AM   Modules accepted: Orders

## 2021-01-14 NOTE — Telephone Encounter (Signed)
Patient called and stated that she is unable to get [facility] because [facility].  Patient is requesting the Rx to be sent to [facility] instead.  Pended Rx and sent to [facility] for approval.

## 2021-01-21 ENCOUNTER — Ambulatory Visit (INDEPENDENT_AMBULATORY_CARE_PROVIDER_SITE_OTHER): Payer: Medicare Other | Admitting: Nurse Practitioner

## 2021-01-21 ENCOUNTER — Other Ambulatory Visit: Payer: Self-pay

## 2021-01-21 ENCOUNTER — Encounter: Payer: Self-pay | Admitting: Nurse Practitioner

## 2021-01-21 VITALS — BP 124/80 | HR 92 | Temp 96.8°F | Ht 65.0 in | Wt 151.0 lb

## 2021-01-21 DIAGNOSIS — E039 Hypothyroidism, unspecified: Secondary | ICD-10-CM

## 2021-01-21 DIAGNOSIS — R42 Dizziness and giddiness: Secondary | ICD-10-CM | POA: Diagnosis not present

## 2021-01-21 DIAGNOSIS — R011 Cardiac murmur, unspecified: Secondary | ICD-10-CM | POA: Diagnosis not present

## 2021-01-21 DIAGNOSIS — D582 Other hemoglobinopathies: Secondary | ICD-10-CM | POA: Diagnosis not present

## 2021-01-21 DIAGNOSIS — R269 Unspecified abnormalities of gait and mobility: Secondary | ICD-10-CM | POA: Diagnosis not present

## 2021-01-21 DIAGNOSIS — D45 Polycythemia vera: Secondary | ICD-10-CM | POA: Diagnosis not present

## 2021-01-21 DIAGNOSIS — L4052 Psoriatic arthritis mutilans: Secondary | ICD-10-CM | POA: Diagnosis not present

## 2021-01-21 DIAGNOSIS — H532 Diplopia: Secondary | ICD-10-CM | POA: Diagnosis not present

## 2021-01-21 DIAGNOSIS — H539 Unspecified visual disturbance: Secondary | ICD-10-CM

## 2021-01-21 NOTE — Patient Instructions (Signed)
Meclizine 12.5 mg by mouth every 6 hours as needed for dizziness.

## 2021-01-21 NOTE — Progress Notes (Signed)
Careteam: Patient Care Team: [name], NP as PCP - General (Geriatric Medicine) Tomma Rakers, MD as Referring Physician (Ophthalmology)  PLACE OF SERVICE:  [place]  Advanced Directive information    Allergies  Allergen Reactions  . Codeine Nausea And Vomiting    Weakness and dysequilibrium  . Statins Hives    Myalgias     Chief Complaint  Patient presents with  . Acute Visit    Increased dizziness x 1 month (questions if related to Zetia), mobility concerns related to dizziness and blurred vision (wears glasses for distance/driving) , last eye exam 12/02/2019. Patient had an eye exam that was rescheduled x 2 by the eye doctor due to provider being on medical leave. FYI calcium was recommended on BMD, patient unable to take calcium as she was told her calcium level was high per rheumatologist.      HPI: Patient is a 80 y.o. female due to dizziness  Reports she has been unsteady, walking into walls.  Looked up zetia but did not find that it was a side effect.   Reports dizziness has been getting progressively worse.  Reports vision has also been getting worse.  Reports her appt is mid-April.   She also has spinal stenosis that she feels like may contribute.   Reports mild allergies- runny nose in the morning.   No weakness, reports pains behind eyes- comes and goes.  Sleeps on neck pillow and has increase pain on the base of her head. Report double vision at times along with worsening blurred vision.   Has not taken anything to help the dizziness.   sister recently diagnosised with too much iron in her blood, told her to get tested for this.   Review of Systems:  Review of Systems  Constitutional: Negative for chills, fever and weight loss.  HENT: Negative for congestion, hearing loss, sore throat and tinnitus.   Eyes: Positive for blurred vision and double vision. Negative for photophobia.       Pain behind her eyes  Respiratory: Negative for  cough, sputum production and shortness of breath.   Cardiovascular: Negative for chest pain, palpitations and leg swelling.  Gastrointestinal: Negative for abdominal pain, constipation, diarrhea and heartburn.  Genitourinary: Negative for dysuria, frequency and urgency.  Musculoskeletal: Negative for back pain, falls, joint pain and myalgias.  Skin: Negative.   Neurological: Positive for dizziness and headaches. Negative for sensory change, speech change, focal weakness and weakness.  Psychiatric/Behavioral: Negative for depression and memory loss. The patient does not have insomnia.     Past Medical History:  Diagnosis Date  . H/O total knee replacement 2001   Right  . High cholesterol   . History of tonsillectomy and adenoidectomy 1946  . Psoriatic arthritis ([place]) 1990  . Sleep apnea with use of continuous positive airway pressure (CPAP) 2004  . Thyroid disease    Past Surgical History:  Procedure Laterality Date  . FOOT SURGERY     fused arch in left foot  . SPINE SURGERY    . TOTAL KNEE ARTHROPLASTY Left    Social History:   reports that she quit smoking about 47 years ago. Her smoking use included cigarettes. She has a 36.00 pack-year smoking history. She has never used smokeless tobacco. She reports current alcohol use of about 1.0 - 2.0 standard drink of alcohol per week. She reports that she does not use drugs.  Family History  Problem Relation Age of Onset  . Heart attack Father   .  Heart disease Sister     Medications: Patient's Medications  New Prescriptions   No medications on file  Previous Medications   CALCIUM CARBONATE (TUMS - DOSED IN MG ELEMENTAL CALCIUM) 500 MG CHEWABLE TABLET    Chew 1 tablet by mouth as needed for indigestion or heartburn.   CHOLECALCIFEROL (VITAMIN D) 50 MCG (2000 UT) CAPS    Take 6,000 Units by mouth daily.    ETANERCEPT (ENBREL) 50 MG/ML INJECTION    Inject 50 mg into the skin every Sunday.    EZETIMIBE (ZETIA) 10 MG TABLET    Take  1 tablet (10 mg total) by mouth daily.   HYDROCODONE-ACETAMINOPHEN (NORCO) 5-325 MG TABLET    Take 1 tablet by mouth at bedtime.   LEVOTHYROXINE (SYNTHROID) 88 MCG TABLET    TAKE 1 TABLET BY MOUTH ONCE DAILY 1/2 HOUR BEFORE  BREAKFAST ON AN EMPTY  STOMACH FOR THYROID   NIACIN PO    Take 2 tablets by mouth daily.   RED YEAST RICE EXTRACT (RED YEAST RICE PO)    Take 2 tablets by mouth daily.  Modified Medications   No medications on file  Discontinued Medications   No medications on file    Physical Exam:  Vitals:   01/21/21 1547  BP: 124/80  Pulse: 92  Temp: (!) 96.8 F (36 C)  TempSrc: Temporal  SpO2: 99%  Weight: 151 lb (68.5 kg)  Height: 5\' 5"  (1.651 m)   Body mass index is 25.13 kg/m. Wt Readings from Last 3 Encounters:  01/21/21 151 lb (68.5 kg)  09/10/20 156 lb (70.8 kg)  02/17/20 157 lb 12.8 oz (71.6 kg)    Physical Exam Constitutional:      General: She is not in acute distress.    Appearance: She is well-developed. She is not diaphoretic.  HENT:     Head: Normocephalic and atraumatic.     Right Ear: Tympanic membrane, ear canal and external ear normal. There is no impacted cerumen.     Left Ear: Tympanic membrane, ear canal and external ear normal.     Nose: Congestion present. No rhinorrhea.     Mouth/Throat:     Mouth: Mucous membranes are moist.     Pharynx: Oropharynx is clear. No oropharyngeal exudate.  Eyes:     General: Lids are normal.     Extraocular Movements:     Right eye: Normal extraocular motion and no nystagmus.     Left eye: Normal extraocular motion and no nystagmus.     Pupils: Pupils are equal, round, and reactive to light.     Comments: Darken conjunctivae bilaterally  Cardiovascular:     Rate and Rhythm: Normal rate and regular rhythm.     Heart sounds: Normal heart sounds.  Pulmonary:     Effort: Pulmonary effort is normal.     Breath sounds: Normal breath sounds.  Abdominal:     General: Bowel sounds are normal.      Palpations: Abdomen is soft.  Musculoskeletal:        General: No tenderness.     Cervical back: Normal range of motion and neck supple.  Skin:    General: Skin is warm and dry.  Neurological:     Mental Status: She is alert and oriented to person, place, and time.     Deep Tendon Reflexes: Reflexes abnormal.     Labs reviewed: Basic Metabolic Panel: Recent Labs    02/14/20 0949 08/20/20 0952  NA 139 139  K 4.6  4.4  CL 105 107  CO2 24 25  GLUCOSE 88 91  BUN 19 18  CREATININE 1.12* 1.12*  CALCIUM 9.5 9.4  TSH 0.91  --    Liver Function Tests: Recent Labs    02/14/20 0949 08/20/20 0952  AST 19 17  ALT 15 15  BILITOT 0.5 0.8  PROT 7.5 7.6   No results for input(s): LIPASE, AMYLASE in the last 8760 hours. No results for input(s): AMMONIA in the last 8760 hours. CBC: Recent Labs    02/14/20 0949 08/20/20 0952  WBC 5.9 5.0  NEUTROABS 3,044 2,520  HGB 15.0 15.2  HCT 46.9* 48.1*  MCV 83.5 83.4  PLT 235 209   Lipid Panel: Recent Labs    08/20/20 0952  CHOL 232*  HDL 47*  LDLCALC 153*  TRIG 186*  CHOLHDL 4.9   TSH: Recent Labs    02/14/20 0949  TSH 0.91   A1C: No results found for: HGBA1C   Assessment/Plan 1. Dizziness -progressively worse dizziness, gait abnormality and visual disturbances.   - ECHOCARDIOGRAM COMPLETE; Future - TSH - CBC with Differential/Platelet - COMPLETE METABOLIC PANEL WITH GFR - MR Angiogram Neck W Wo Contrast; Future - MR Brain W Wo Contrast; Future - MR Angiogram Head Wo Contrast; Future  2. 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  3. Heart murmur -echo done in 2017, will update at this time - ECHOCARDIOGRAM COMPLETE; Future  4. Abnormal gait -gait appears normal in office however reports feeling of unsteadiness and abnormal gait. With gait disturbance and due to dizziness, visual changes, will get imaging to evaluate further to rule out vascular changes or injury.   - TSH - CBC with Differential/Platelet - COMPLETE METABOLIC PANEL WITH GFR - MR Angiogram Neck W Wo Contrast; Future - MR Brain W Wo Contrast; Future - MR Angiogram Head Wo Contrast; Future  5. Acquired hypothyroidism - TSH  6. Elevated hemoglobin ([place]) -suspect sister with a blood abnormality. Pt reports it is not hemachromatosis, will elevated hgb will have her start ASA 81 mg daily and follow up with hematology -ferritin level - Ambulatory referral to Hematology  7. Visual changes -encouraged to follow up with ophthalmologist with other symptoms will get imaging of brain and neck to assess blood flow.  - MR Angiogram Neck W Wo Contrast; Future - MR Brain W Wo Contrast; Future - MR Angiogram Head Wo Contrast; Future  8. Diplopia  -double vision noted. Will follow up MRI MRA to assess blood flow due to symptoms of dizziness, blurred vision and gait abnormality.  - MR Angiogram Neck W Wo Contrast; Future - MR Brain W Wo Contrast; Future - MR Angiogram Head Wo Contrast; Future   [facility], [facility] [phone]

## 2021-01-23 LAB — CBC WITH DIFFERENTIAL/PLATELET
Absolute Monocytes: 641 cells/uL (ref 200–950)
Basophils Absolute: 7 cells/uL (ref 0–200)
Basophils Relative: 0.1 %
Eosinophils Absolute: 0 cells/uL — ABNORMAL LOW (ref 15–500)
Eosinophils Relative: 0 %
HCT: 50.2 % — ABNORMAL HIGH (ref 35.0–45.0)
Hemoglobin: 15.9 g/dL — ABNORMAL HIGH (ref 11.7–15.5)
Lymphs Abs: 1922 cells/uL (ref 850–3900)
MCH: 26.4 pg — ABNORMAL LOW (ref 27.0–33.0)
MCHC: 31.7 g/dL — ABNORMAL LOW (ref 32.0–36.0)
MCV: 83.4 fL (ref 80.0–100.0)
MPV: 11.1 fL (ref 7.5–12.5)
Monocytes Relative: 8.9 %
Neutro Abs: 4630 cells/uL (ref 1500–7800)
Neutrophils Relative %: 64.3 %
Platelets: 237 10*3/uL (ref 140–400)
RBC: 6.02 10*6/uL — ABNORMAL HIGH (ref 3.80–5.10)
RDW: 13.4 % (ref 11.0–15.0)
Total Lymphocyte: 26.7 %
WBC: 7.2 10*3/uL (ref 3.8–10.8)

## 2021-01-23 LAB — COMPLETE METABOLIC PANEL WITH GFR
AG Ratio: 1.3 (calc) (ref 1.0–2.5)
ALT: 18 U/L (ref 6–29)
AST: 19 U/L (ref 10–35)
Albumin: 4.5 g/dL (ref 3.6–5.1)
Alkaline phosphatase (APISO): 62 U/L (ref 37–153)
BUN/Creatinine Ratio: 16 (calc) (ref 6–22)
BUN: 16 mg/dL (ref 7–25)
CO2: 22 mmol/L (ref 20–32)
Calcium: 9.8 mg/dL (ref 8.6–10.4)
Chloride: 108 mmol/L (ref 98–110)
Creat: 0.98 mg/dL — ABNORMAL HIGH (ref 0.60–0.88)
GFR, Est African American: 63 mL/min/{1.73_m2} (ref >=60)
GFR, Est Non African American: 54 mL/min/{1.73_m2} — ABNORMAL LOW (ref >=60)
Globulin: 3.5 g/dL (calc) (ref 1.9–3.7)
Glucose, Bld: 90 mg/dL (ref 65–139)
Potassium: 4.4 mmol/L (ref 3.5–5.3)
Sodium: 141 mmol/L (ref 135–146)
Total Bilirubin: 0.6 mg/dL (ref 0.2–1.2)
Total Protein: 8 g/dL (ref 6.1–8.1)

## 2021-01-23 LAB — TSH: TSH: 0.58 mIU/L (ref 0.40–4.50)

## 2021-01-23 LAB — TEST AUTHORIZATION

## 2021-01-23 LAB — FERRITIN: Ferritin: 84 ng/mL (ref 16–288)

## 2021-01-24 ENCOUNTER — Telehealth: Payer: Self-pay

## 2021-01-24 NOTE — Telephone Encounter (Signed)
Patient plans to get covid booster tomorrow and questions if Sydney Reid has any contraindications as to why she should not   Please advise

## 2021-01-24 NOTE — Telephone Encounter (Signed)
She may have increase in symptoms after the shot, hopefully this will be mild. That would be my only caution.

## 2021-01-24 NOTE — Telephone Encounter (Signed)
Called patient, no answer, and no voicemail

## 2021-01-25 ENCOUNTER — Telehealth: Payer: Self-pay | Admitting: Internal Medicine

## 2021-01-25 NOTE — Telephone Encounter (Signed)
Received a new hem referral from Sherrie Mustache, NP for elevated hgb. Sydney Reid has been cld and scheduled to see Dr. Julien Nordmann on 4/5 at 11:45am w/labs at 11:15am. Pt aware to arrive 15 minutes early.

## 2021-01-25 NOTE — Telephone Encounter (Signed)
Left message on voicemail for patient to return call when available   

## 2021-01-25 NOTE — Telephone Encounter (Signed)
Patient aware of Jessica's reply

## 2021-01-28 DIAGNOSIS — Z961 Presence of intraocular lens: Secondary | ICD-10-CM | POA: Diagnosis not present

## 2021-01-28 DIAGNOSIS — H18513 Endothelial corneal dystrophy, bilateral: Secondary | ICD-10-CM | POA: Diagnosis not present

## 2021-01-28 DIAGNOSIS — Z23 Encounter for immunization: Secondary | ICD-10-CM | POA: Diagnosis not present

## 2021-01-29 ENCOUNTER — Inpatient Hospital Stay: Payer: Medicare Other

## 2021-01-29 ENCOUNTER — Inpatient Hospital Stay: Payer: Medicare Other | Attending: Internal Medicine | Admitting: Internal Medicine

## 2021-01-29 ENCOUNTER — Other Ambulatory Visit: Payer: Self-pay

## 2021-01-29 ENCOUNTER — Other Ambulatory Visit: Payer: Self-pay | Admitting: Medical Oncology

## 2021-01-29 ENCOUNTER — Encounter: Payer: Self-pay | Admitting: Internal Medicine

## 2021-01-29 VITALS — BP 124/80 | HR 110 | Temp 98.7°F | Resp 18 | Ht 65.0 in | Wt 152.0 lb

## 2021-01-29 DIAGNOSIS — M2550 Pain in unspecified joint: Secondary | ICD-10-CM | POA: Insufficient documentation

## 2021-01-29 DIAGNOSIS — R61 Generalized hyperhidrosis: Secondary | ICD-10-CM | POA: Insufficient documentation

## 2021-01-29 DIAGNOSIS — Z8249 Family history of ischemic heart disease and other diseases of the circulatory system: Secondary | ICD-10-CM | POA: Diagnosis not present

## 2021-01-29 DIAGNOSIS — L4050 Arthropathic psoriasis, unspecified: Secondary | ICD-10-CM | POA: Diagnosis not present

## 2021-01-29 DIAGNOSIS — R0602 Shortness of breath: Secondary | ICD-10-CM | POA: Diagnosis not present

## 2021-01-29 DIAGNOSIS — E039 Hypothyroidism, unspecified: Secondary | ICD-10-CM | POA: Insufficient documentation

## 2021-01-29 DIAGNOSIS — D45 Polycythemia vera: Secondary | ICD-10-CM

## 2021-01-29 DIAGNOSIS — Z888 Allergy status to other drugs, medicaments and biological substances status: Secondary | ICD-10-CM | POA: Diagnosis not present

## 2021-01-29 DIAGNOSIS — R42 Dizziness and giddiness: Secondary | ICD-10-CM

## 2021-01-29 DIAGNOSIS — E785 Hyperlipidemia, unspecified: Secondary | ICD-10-CM | POA: Insufficient documentation

## 2021-01-29 DIAGNOSIS — Z79899 Other long term (current) drug therapy: Secondary | ICD-10-CM | POA: Insufficient documentation

## 2021-01-29 DIAGNOSIS — Z832 Family history of diseases of the blood and blood-forming organs and certain disorders involving the immune mechanism: Secondary | ICD-10-CM

## 2021-01-29 DIAGNOSIS — Z87891 Personal history of nicotine dependence: Secondary | ICD-10-CM | POA: Diagnosis not present

## 2021-01-29 DIAGNOSIS — Z885 Allergy status to narcotic agent status: Secondary | ICD-10-CM | POA: Diagnosis not present

## 2021-01-29 DIAGNOSIS — G4730 Sleep apnea, unspecified: Secondary | ICD-10-CM | POA: Diagnosis not present

## 2021-01-29 LAB — CMP (CANCER CENTER ONLY)
ALT: 28 U/L (ref 0–44)
AST: 24 U/L (ref 15–41)
Albumin: 4.2 g/dL (ref 3.5–5.0)
Alkaline Phosphatase: 71 U/L (ref 38–126)
Anion gap: 11 (ref 5–15)
BUN: 17 mg/dL (ref 8–23)
CO2: 23 mmol/L (ref 22–32)
Calcium: 9.2 mg/dL (ref 8.9–10.3)
Chloride: 106 mmol/L (ref 98–111)
Creatinine: 1.16 mg/dL — ABNORMAL HIGH (ref 0.44–1.00)
GFR, Estimated: 48 mL/min — ABNORMAL LOW (ref >60)
Glucose, Bld: 102 mg/dL — ABNORMAL HIGH (ref 70–99)
Potassium: 4.5 mmol/L (ref 3.5–5.1)
Sodium: 140 mmol/L (ref 135–145)
Total Bilirubin: 0.9 mg/dL (ref 0.3–1.2)
Total Protein: 8.3 g/dL — ABNORMAL HIGH (ref 6.5–8.1)

## 2021-01-29 LAB — CBC WITH DIFFERENTIAL (CANCER CENTER ONLY)
Abs Immature Granulocytes: 0.02 10*3/uL (ref 0.00–0.07)
Basophils Absolute: 0 10*3/uL (ref 0.0–0.1)
Basophils Relative: 0 %
Eosinophils Absolute: 0 10*3/uL (ref 0.0–0.5)
Eosinophils Relative: 0 %
HCT: 48.3 % — ABNORMAL HIGH (ref 36.0–46.0)
Hemoglobin: 14.9 g/dL (ref 12.0–15.0)
Immature Granulocytes: 0 %
Lymphocytes Relative: 11 %
Lymphs Abs: 0.7 10*3/uL (ref 0.7–4.0)
MCH: 26.1 pg (ref 26.0–34.0)
MCHC: 30.8 g/dL (ref 30.0–36.0)
MCV: 84.6 fL (ref 80.0–100.0)
Monocytes Absolute: 0.5 10*3/uL (ref 0.1–1.0)
Monocytes Relative: 7 %
Neutro Abs: 5.7 10*3/uL (ref 1.7–7.7)
Neutrophils Relative %: 82 %
Platelet Count: 200 10*3/uL (ref 150–400)
RBC: 5.71 MIL/uL — ABNORMAL HIGH (ref 3.87–5.11)
RDW: 14.2 % (ref 11.5–15.5)
WBC Count: 6.9 10*3/uL (ref 4.0–10.5)
nRBC: 0 % (ref 0.0–0.2)

## 2021-01-29 LAB — IRON AND TIBC
Iron: 58 ug/dL (ref 41–142)
Saturation Ratios: 16 % — ABNORMAL LOW (ref 21–57)
TIBC: 351 ug/dL (ref 236–444)
UIBC: 294 ug/dL (ref 120–384)

## 2021-01-29 NOTE — Progress Notes (Signed)
[place] [phone]   [phone]  CONSULT NOTE  REFERRING PHYSICIAN: Sherrie Mustache, NP  REASON FOR CONSULTATION:  80 years old white female with polycythemia.  HPI Sydney Reid is a 80 y.o. female with past medical history significant for dyslipidemia, sleep apnea, psoriatic arthritis, hypothyroidism as well as right knee replacement.  The patient was seen by her primary care provider recently for routine evaluation and blood work including CBC showed persistently elevated total red blood cells as well as hemoglobin and hematocrit.  Serum ferritin was normal.  The patient mentioned that her sister had a certain mutation related to red blood cells and she advised her to be tested to.  She is likely talking about JAK2 mutation but the patient is not sure.  She is feeling fine with no concerning complaints except for the psoriatic arthritis and she is currently on Enbrel.  She has no significant fatigue or weakness.  She has no chest pain but has mild shortness of breath with exertion with no cough or hemoptysis.  She has no weight loss but intermittent night sweats.  She has no headache or visual changes. Family history significant for father and sister with heart disease, mother had ALS. The patient is married and has 2 stepchildren and 3 grandchildren.  She used to work as Materials engineer but currently retired.  She has a history of smoking more than 1 pack/day for around 17 years but quit 45 years ago.  She drinks alcohol occasionally and no history of drug abuse.  HPI  Past Medical History:  Diagnosis Date  . H/O total knee replacement 2001   Right  . High cholesterol   . History of tonsillectomy and adenoidectomy 1946  . Psoriatic arthritis ([place]) 1990  . Sleep apnea with use of continuous positive airway pressure (CPAP) 2004  . Thyroid disease     Past Surgical History:  Procedure Laterality Date  . FOOT SURGERY     fused arch in left  foot  . SPINE SURGERY    . TOTAL KNEE ARTHROPLASTY Left     Family History  Problem Relation Age of Onset  . Heart attack Father   . Heart disease Sister     Social History Social History   Tobacco Use  . Smoking status: Former Smoker    Packs/day: 2.00    Years: 18.00    Pack years: 36.00    Types: Cigarettes    Quit date: 1975    Years since quitting: 47.2  . Smokeless tobacco: Never Used  Vaping Use  . Vaping Use: Never used  Substance Use Topics  . Alcohol use: Yes    Alcohol/week: 1.0 - 2.0 standard drink    Types: 1 - 2 Standard drinks or equivalent per week    Comment: Social drinker, couple times a week  . Drug use: No    Allergies  Allergen Reactions  . Codeine Nausea And Vomiting    Weakness and dysequilibrium  . Statins Hives    Myalgias     Current Outpatient Medications  Medication Sig Dispense Refill  . calcium carbonate (TUMS - DOSED IN MG ELEMENTAL CALCIUM) 500 MG chewable tablet Chew 1 tablet by mouth as needed for indigestion or heartburn.    . Cholecalciferol (VITAMIN D) 50 MCG (2000 UT) CAPS Take 6,000 Units by mouth daily.     Marland Kitchen etanercept (ENBREL) 50 MG/ML injection Inject 50 mg into the skin every Sunday.     Marland Kitchen  ezetimibe (ZETIA) 10 MG tablet Take 1 tablet (10 mg total) by mouth daily. 90 tablet 1  . HYDROcodone-acetaminophen (NORCO) 5-325 MG tablet Take 1 tablet by mouth at bedtime. 30 tablet 0  . levothyroxine (SYNTHROID) 88 MCG tablet TAKE 1 TABLET BY MOUTH ONCE DAILY 1/2 HOUR BEFORE  BREAKFAST ON AN EMPTY  STOMACH FOR THYROID 90 tablet 3  . NIACIN PO Take 2 tablets by mouth daily.    . Red Yeast Rice Extract (RED YEAST RICE PO) Take 2 tablets by mouth daily.     No current facility-administered medications for this visit.    Review of Systems  Constitutional: negative Eyes: negative Ears, nose, mouth, throat, and face: negative Respiratory: positive for dyspnea on exertion Cardiovascular: negative Gastrointestinal:  negative Genitourinary:negative Integument/breast: negative Hematologic/lymphatic: negative Musculoskeletal:positive for arthralgias Neurological: negative Behavioral/Psych: negative Endocrine: negative Allergic/Immunologic: negative  Physical Exam  PPJ:KDTOI, healthy, no distress, well nourished, well developed and anxious SKIN: skin color, texture, turgor are normal, no rashes or significant lesions HEAD: Normocephalic, No masses, lesions, tenderness or abnormalities EYES: normal, PERRLA, Conjunctiva are pink and non-injected EARS: External ears normal, Canals clear OROPHARYNX:no exudate, no erythema and lips, buccal mucosa, and tongue normal  NECK: supple, no adenopathy, no JVD LYMPH:  no palpable lymphadenopathy, no hepatosplenomegaly BREAST:not examined LUNGS: clear to auscultation , and palpation HEART: regular rate & rhythm, no murmurs and no gallops ABDOMEN:abdomen soft, non-tender, normal bowel sounds and no masses or organomegaly BACK: No CVA tenderness, Range of motion is normal EXTREMITIES:no joint deformities, effusion, or inflammation, no edema  NEURO: alert & oriented x 3 with fluent speech, no focal motor/sensory deficits  PERFORMANCE STATUS: ECOG 1  LABORATORY DATA: Lab Results  Component Value Date   WBC 6.9 01/29/2021   HGB 14.9 01/29/2021   HCT 48.3 (H) 01/29/2021   MCV 84.6 01/29/2021   PLT 200 01/29/2021      Chemistry      Component Value Date/Time   NA 141 01/21/2021 1630   NA 138 04/02/2016 0000   K 4.4 01/21/2021 1630   CL 108 01/21/2021 1630   CO2 22 01/21/2021 1630   BUN 16 01/21/2021 1630   BUN 17 04/02/2016 0000   CREATININE 0.98 (H) 01/21/2021 1630   GLU 93 04/02/2016 0000      Component Value Date/Time   CALCIUM 9.8 01/21/2021 1630   ALKPHOS 68 03/21/2019 1330   AST 19 01/21/2021 1630   ALT 18 01/21/2021 1630   BILITOT 0.6 01/21/2021 1630       RADIOGRAPHIC STUDIES: No results found.  ASSESSMENT: This is a very pleasant  80 years old white female with highly suspicious polycythemia vera based on her persistently elevated red blood cells as well as the family history of suspicious polycythemia vera with JAK2 mutation in her sister.   PLAN: I had a lengthy discussion with the patient today about her current condition and treatment options.  I recommended for the patient to have repeat CBC which showed the persistent elevated red blood cells but the patient has normal hemoglobin and currently asymptomatic. I also order iron study and molecular studies for JAK2 mutation. If the JAK2 mutation test is positive, we will continue to monitor her closely and consider her for phlebotomy to keep her hematocrit in the range of 42-45% but if the test is negative, she will follow-up with her primary care physician with routine observation and monitoring. The patient agreed to the current plan. She will call the office in 1 week  for the results and also further recommendation regarding her condition. She was advised to call immediately if she has any other concerning symptoms in the interval. The patient voices understanding of current disease status and treatment options and is in agreement with the current care plan.  All questions were answered. The patient knows to call the clinic with any problems, questions or concerns. We can certainly see the patient much sooner if necessary.  Thank you so much for allowing me to participate in the care of Sydney Reid. I will continue to follow up the patient with you and assist in her care.  The total time spent in the appointment was 60 minutes.  Disclaimer: This note was dictated with voice recognition software. Similar sounding words can inadvertently be transcribed and may not be corrected upon review.   Eilleen Kempf January 29, 2021, 11:46 AM

## 2021-01-29 NOTE — Addendum Note (Signed)
Addended by: Ardeen Garland on: 01/29/2021 02:34 PM   Modules accepted: Orders

## 2021-02-05 ENCOUNTER — Other Ambulatory Visit: Payer: Self-pay | Admitting: Internal Medicine

## 2021-02-05 ENCOUNTER — Telehealth: Payer: Self-pay | Admitting: Internal Medicine

## 2021-02-05 DIAGNOSIS — D45 Polycythemia vera: Secondary | ICD-10-CM

## 2021-02-05 LAB — JAK2 (INCLUDING V617F AND EXON 12), MPL,& CALR-NEXT GEN SEQ

## 2021-02-05 NOTE — Telephone Encounter (Signed)
Scheduled per 4/12 sch msg. Called and spoke with pt, confirmed 4/21 appts

## 2021-02-07 ENCOUNTER — Telehealth: Payer: Self-pay | Admitting: Medical Oncology

## 2021-02-07 NOTE — Telephone Encounter (Signed)
Per pt request I left voice mail that per [facility] ,she has a positive Jak -2 mutation and I will send schedule message for f/u labs and appt.

## 2021-02-11 ENCOUNTER — Ambulatory Visit
Admission: RE | Admit: 2021-02-11 | Discharge: 2021-02-11 | Disposition: A | Discharge status: Home / Self Care | Payer: Medicare Other | Source: Ambulatory Visit | Attending: Nurse Practitioner | Admitting: Nurse Practitioner

## 2021-02-11 ENCOUNTER — Other Ambulatory Visit: Payer: Self-pay

## 2021-02-11 DIAGNOSIS — H539 Unspecified visual disturbance: Secondary | ICD-10-CM

## 2021-02-11 DIAGNOSIS — R42 Dizziness and giddiness: Secondary | ICD-10-CM

## 2021-02-11 DIAGNOSIS — R269 Unspecified abnormalities of gait and mobility: Secondary | ICD-10-CM

## 2021-02-11 DIAGNOSIS — H532 Diplopia: Secondary | ICD-10-CM

## 2021-02-11 DIAGNOSIS — I6521 Occlusion and stenosis of right carotid artery: Secondary | ICD-10-CM | POA: Diagnosis not present

## 2021-02-11 DIAGNOSIS — L4052 Psoriatic arthritis mutilans: Secondary | ICD-10-CM

## 2021-02-11 DIAGNOSIS — I6601 Occlusion and stenosis of right middle cerebral artery: Secondary | ICD-10-CM | POA: Diagnosis not present

## 2021-02-11 DIAGNOSIS — G894 Chronic pain syndrome: Secondary | ICD-10-CM

## 2021-02-11 IMAGING — MR MR HEAD WO/W CM
12 series · 48 of 48 positions shown · IV contrast (14ml Multihance)
Comparison: None.

CLINICAL DATA: Dizziness, abnormal gait, and visual
changes/diplopia.

EXAM:
MRI HEAD WITHOUT AND WITH CONTRAST
MRA HEAD WITHOUT CONTRAST
MRA NECK WITHOUT AND WITH CONTRAST
TECHNIQUE: Multiplanar, multiecho pulse sequences of the brain and surrounding
structures were obtained without and with intravenous contrast.
Angiographic images of the Circle of Willis were obtained using MRA
technique without intravenous contrast. Angiographic images of the
neck were obtained using MRA technique without and with intravenous
contrast. Carotid stenosis measurements (when applicable) are
obtained utilizing NASCET criteria, using the distal internal
carotid diameter as the denominator.
CONTRAST:  14mL MULTIHANCE GADOBENATE DIMEGLUMINE 529 MG/ML IV SOLN

[Series 2: T1 · sagittal · 5.0mm · 0.45mm/px · 1 of 23 slices shown]
[im 1/23]
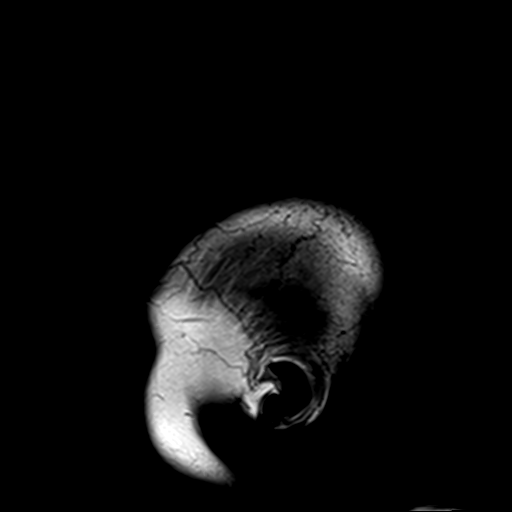

[Series 3: DWI · axial · 3.0mm · 1.80mm/px · z∈[-59,+77]mm · 7 of 100 slices shown (1 of 4)]
[im 1/100]
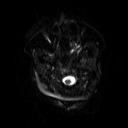
[im 17/100]
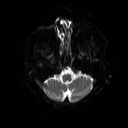
[im 34/100]
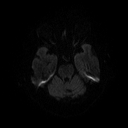
[im 50/100]
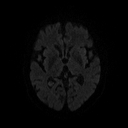
[im 67/100]
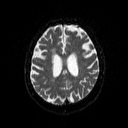
[im 83/100]
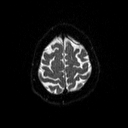
[im 100/100]
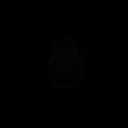

[Series 4: DWI · axial · 3.0mm · 1.80mm/px · z∈[-59,+77]mm · 3 of 50 slices shown (2 of 4)]
[im 1/50]
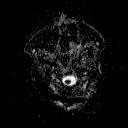
[im 25/50]
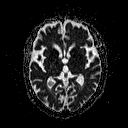
[im 50/50]
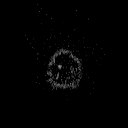

[Series 5: DWI · coronal · 5.0mm · 1.80mm/px · 5 of 68 slices shown (3 of 4)]
[im 1/68]
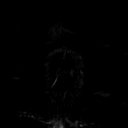
[im 17/68]
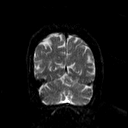
[im 34/68]
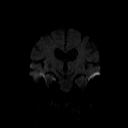
[im 51/68]
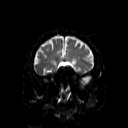
[im 68/68]
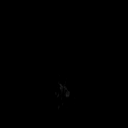

[Series 6: DWI · coronal · 5.0mm · 1.80mm/px · 2 of 34 slices shown (4 of 4)]
[im 1/34]
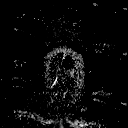
[im 34/34]
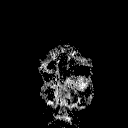

[Series 7: T2 · axial · 5.0mm · 0.60mm/px · z∈[-56,+82]mm · 2 of 23 slices shown (1 of 2)]
[im 1/23]
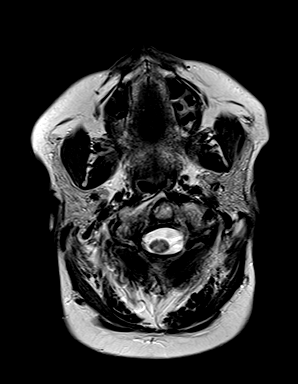
[im 23/23]
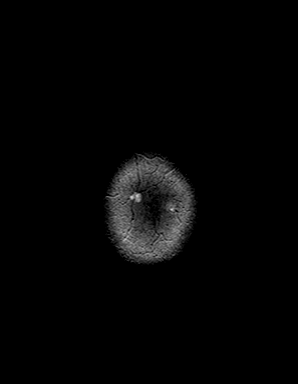

[Series 8: FLAIR · axial · 3.0mm · 0.45mm/px · z∈[-58,+76]mm · 2 of 32 slices shown]
[im 1/32]
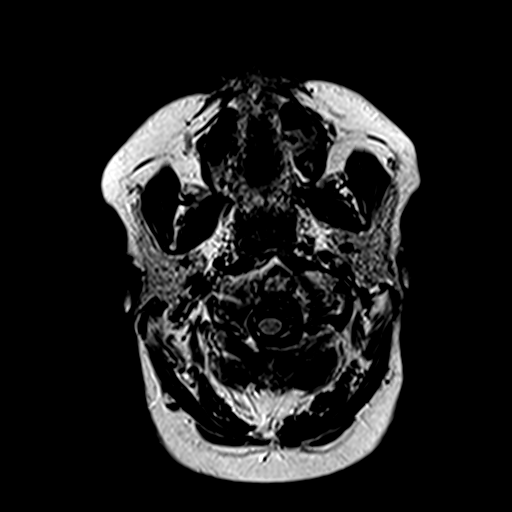
[im 32/32]
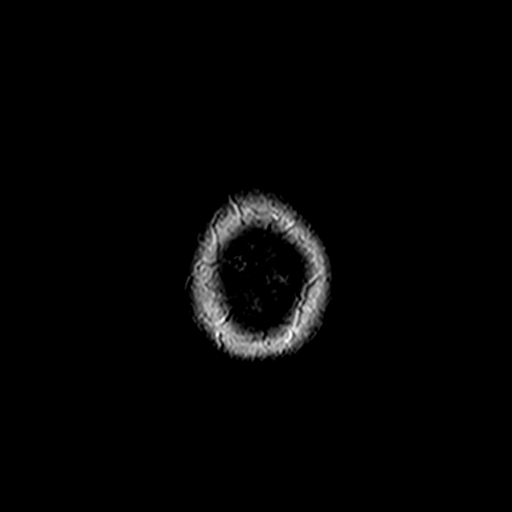

[Series 10: swi_images · axial · 4.0mm · 0.90mm/px · z∈[-55,+75]mm · 2 of 36 slices shown]
[im 1/36]
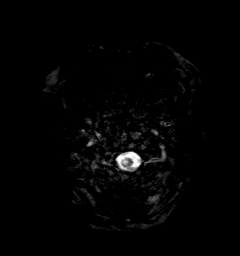
[im 36/36]
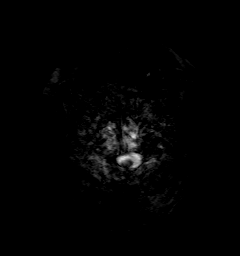

[Series 11: t1_mpr_tra · axial · 1.0mm · 0.75mm/px · z∈[-52,+80]mm · 10 of 144 slices shown]
[im 1/144]
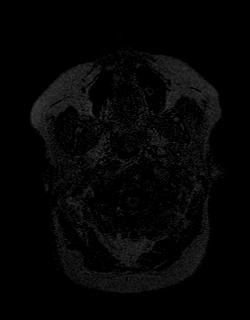
[im 16/144]
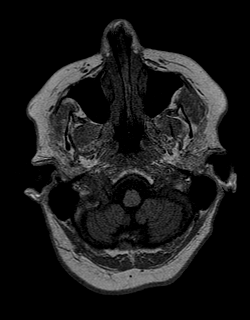
[im 32/144]
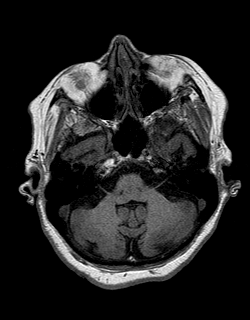
[im 48/144]
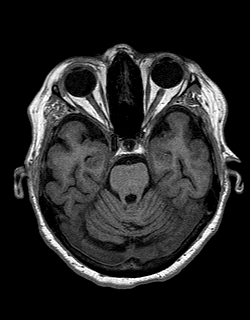
[im 64/144]
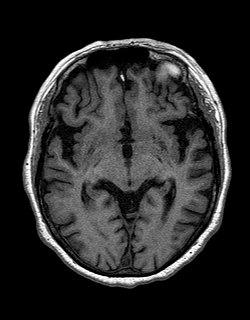
[im 80/144]
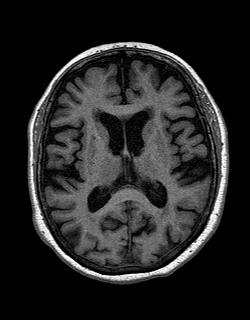
[im 96/144]
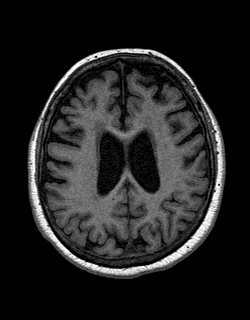
[im 112/144]
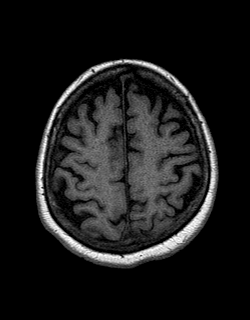
[im 128/144]
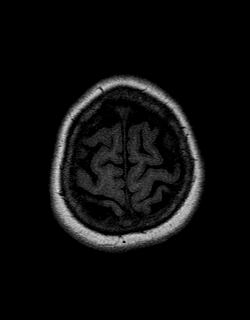
[im 144/144]
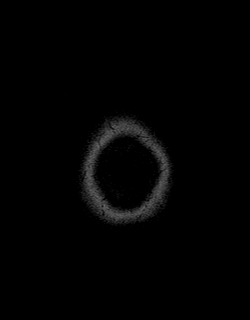

[Series 12: T2 · coronal · 5.0mm · 0.45mm/px · 2 of 25 slices shown (2 of 2)]
[im 1/25]
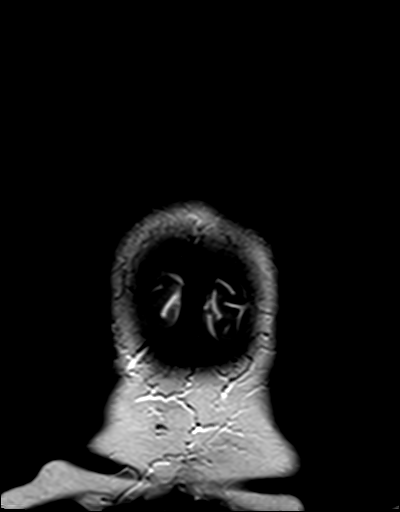
[im 25/25]
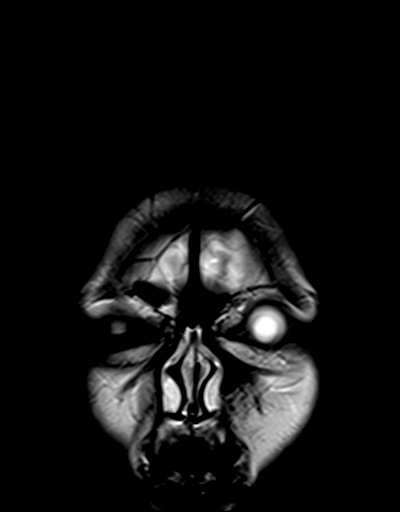

[Series 13: t1_mpr_tra post · axial · 1.0mm · 0.75mm/px · z∈[-52,+80]mm · 10 of 144 slices shown]
[im 1/144]
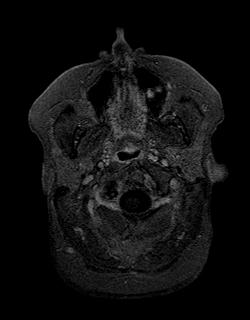
[im 16/144]
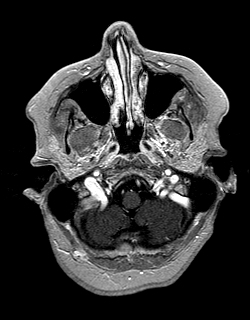
[im 32/144]
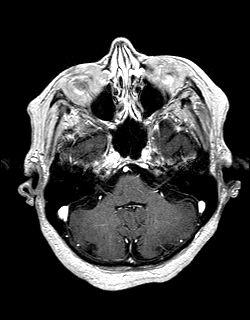
[im 48/144]
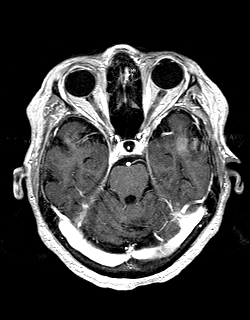
[im 64/144]
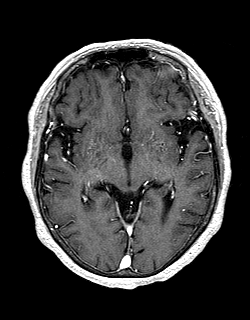
[im 80/144]
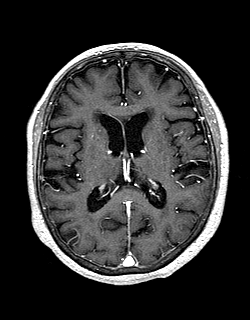
[im 96/144]
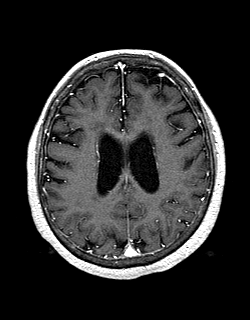
[im 112/144]
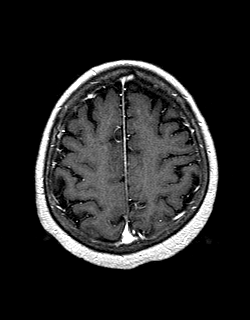
[im 128/144]
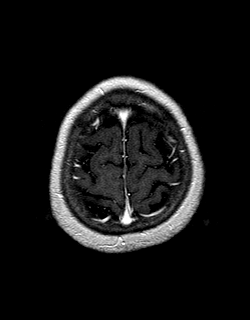
[im 144/144]
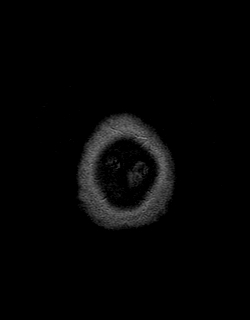

[Series 14: post cor · coronal · 5.0mm · 0.45mm/px · 2 of 25 slices shown]
[im 1/25]
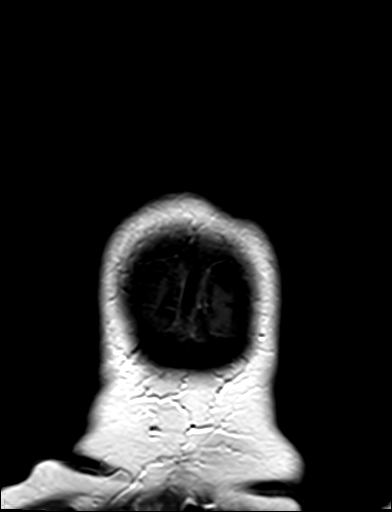
[im 25/25]
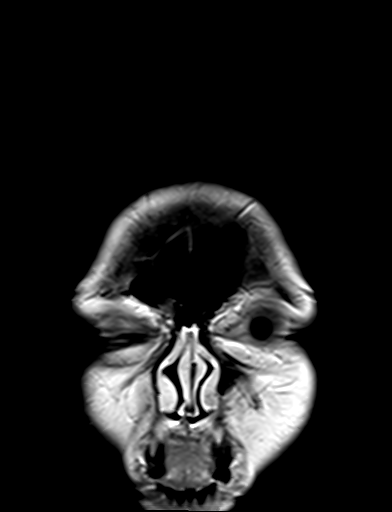

[48 of 48 positions shown; findings below may reference images not displayed]

FINDINGS: MRI HEAD FINDINGS

Brain: There is no evidence of an acute infarct, intracranial
hemorrhage, mass, midline shift, or extra-axial fluid collection. T2
hyperintensities in the cerebral white matter bilaterally are
nonspecific but compatible with chronic small vessel ischemic
disease which is mild for age. Generalized cerebral atrophy is
mild-to-moderate for age. No abnormal enhancement is identified. No
focal cerebellar insult is evident.

Vascular: Major intracranial vascular flow voids are preserved.

Skull and upper cervical spine: Unremarkable bone marrow signal.
Partially visualized postoperative changes in the cervical spine.
Bilateral C2-3 facet ankylosis.

Sinuses/Orbits: Bilateral cataract extraction. Minimal left
maxillary sinus mucosal thickening. Clear mastoid air cells.

Other: None.

MRA HEAD FINDINGS

The intracranial vertebral arteries are patent to the basilar. The
right PICA and left AICA appear dominant. Patent SCAs are seen
bilaterally. The basilar artery is widely patent. Posterior
communicating arteries are diminutive or absent. Both PCAs are
patent without evidence of a significant proximal stenosis.

The internal carotid arteries are patent from skull base to carotid
termini with mild irregularity but no evidence of a significant
stenosis. Apparent mild narrowing of the right ICA at the level of
the anterior genu on reformats is favored to be artifactual based on
source images. ACAs and MCAs are patent without evidence of a
proximal branch occlusion or significant A1 or M1 stenosis. Apparent
proximal right M2 stenosis is considered artifactual given normal
appearance on the contrast-enhanced neck MRA. No aneurysm is
identified.

MRA NECK FINDINGS

There is a normal variant aortic arch branching pattern with common
origin of the brachiocephalic and left common carotid arteries.
There is approximately 60% stenosis of the proximal left subclavian
artery.

The common carotid and cervical internal carotid arteries are patent
without evidence of a significant stenosis or dissection. Both
common carotid arteries are tortuous.

The vertebral arteries are patent with antegrade flow bilaterally
and with the left being mildly to moderately dominant. There is no
evidence of a significant vertebral artery stenosis on the left.
Assessment of the proximal right V1 segment is limited by motion
artifact on both the noncontrast time-of-flight MRA and on the
contrast-enhanced neck MRA with the possibility of a severe stenosis
raised based on the appearance on the contrasted MRA. The right
vertebral artery is otherwise normal in appearance throughout the
remainder of the neck.
IMPRESSION: 1. No acute intracranial abnormality.
2. Mild chronic small vessel ischemic disease and mild-to-moderate
cerebral atrophy.
3. No major intracranial arterial occlusion or significant proximal
intracranial stenosis.
4. Possible significant stenosis in the proximal right vertebral
artery with assessment limited by motion artifact. Consider neck CTA
for further evaluation.
5. Widely patent right vertebral artery.
6. Widely patent cervical carotid arteries.
7. 60% stenosis of the proximal left subclavian artery.

## 2021-02-11 IMAGING — MR MR MRA HEAD W/O CM
2 series · 20 of 48 positions shown · IV contrast (multihance)
Comparison: None.

CLINICAL DATA: Dizziness, abnormal gait, and visual
changes/diplopia.

EXAM:
MRI HEAD WITHOUT AND WITH CONTRAST
MRA HEAD WITHOUT CONTRAST
MRA NECK WITHOUT AND WITH CONTRAST
TECHNIQUE: Multiplanar, multiecho pulse sequences of the brain and surrounding
structures were obtained without and with intravenous contrast.
Angiographic images of the Circle of Willis were obtained using MRA
technique without intravenous contrast. Angiographic images of the
neck were obtained using MRA technique without and with intravenous
contrast. Carotid stenosis measurements (when applicable) are
obtained utilizing NASCET criteria, using the distal internal
carotid diameter as the denominator.
CONTRAST:  14mL MULTIHANCE GADOBENATE DIMEGLUMINE 529 MG/ML IV SOLN

[Series 3: tof_3d_multi-slab · axial · 0.7mm · 0.35mm/px · z∈[-97,+12]mm · 19 of 169 slices shown]
[im 1/169]
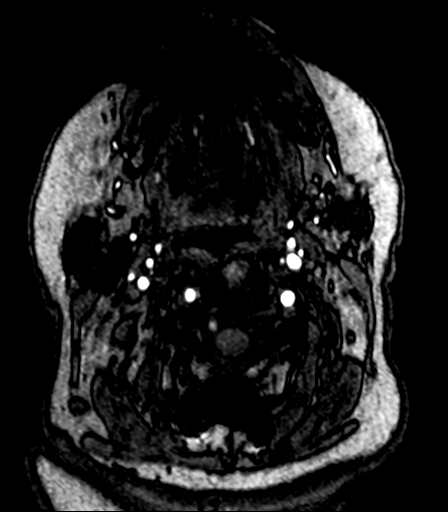
[im 4/169]
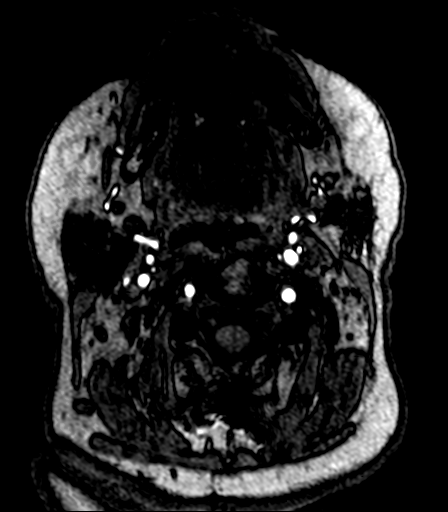
[im 8/169]
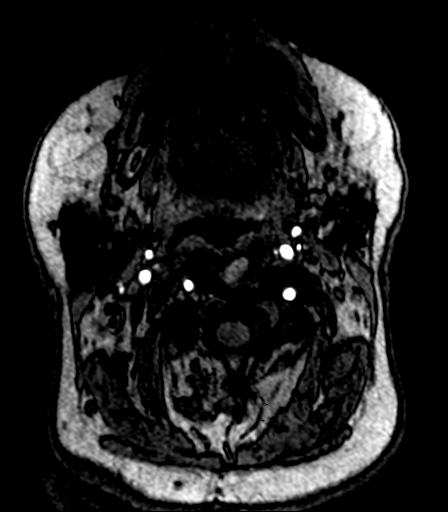
[im 11/169]
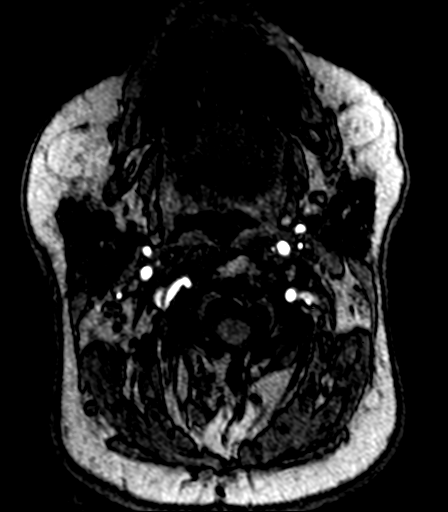
[im 15/169]
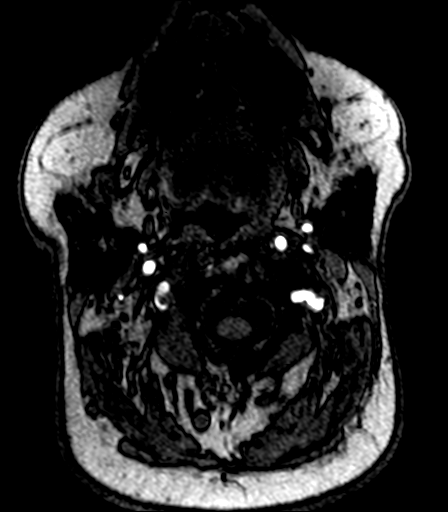
[im 19/169]
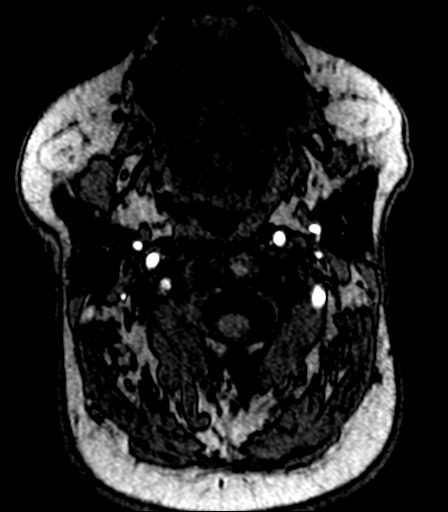
[im 22/169]
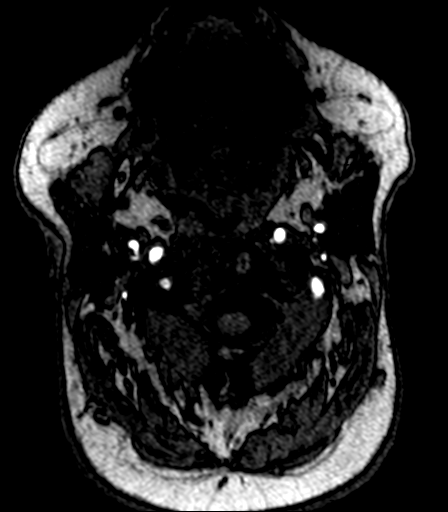
[im 26/169]
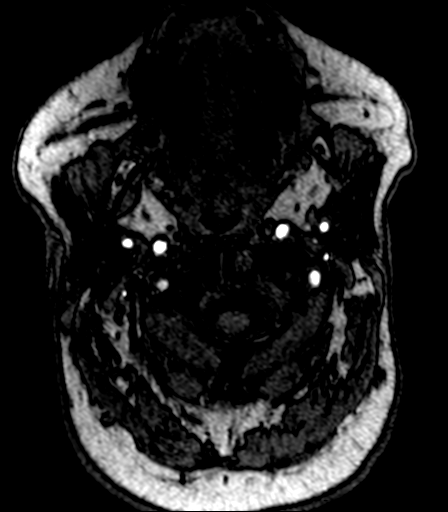
[im 30/169]
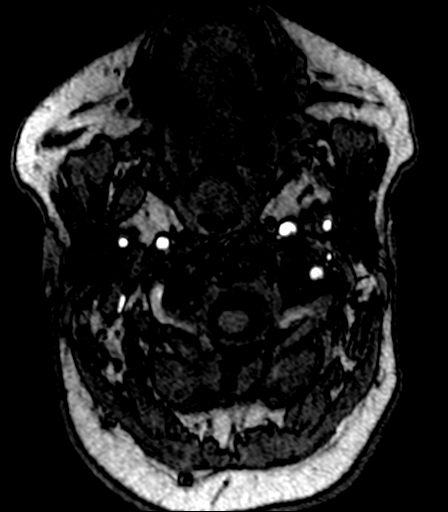
[im 33/169]
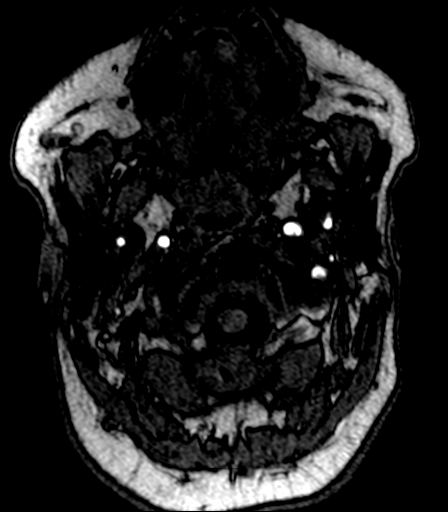
[im 37/169]
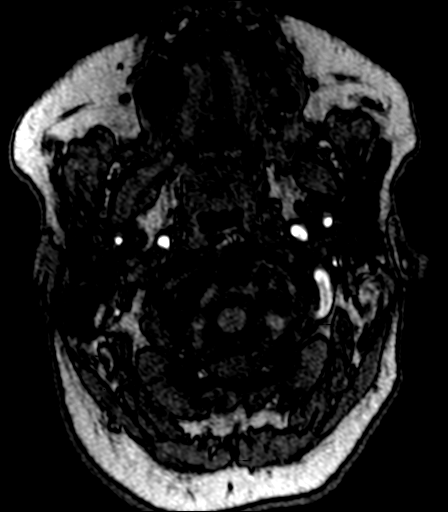
[im 52/169]
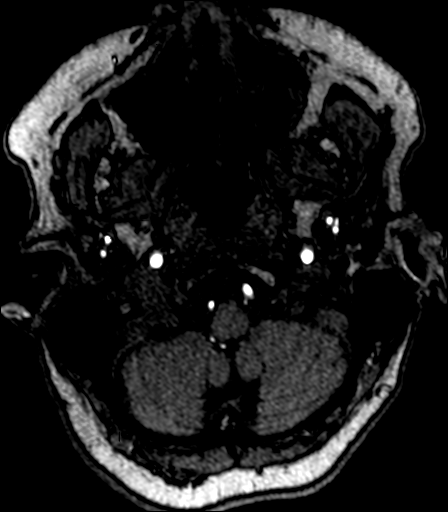
[im 74/169]
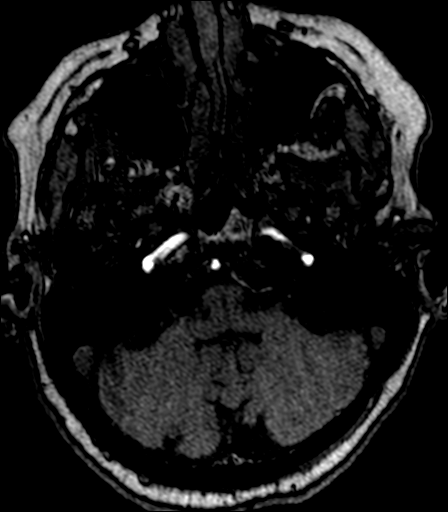
[im 85/169]
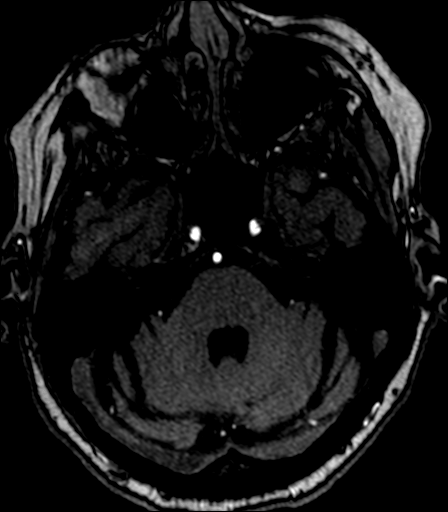
[im 95/169]
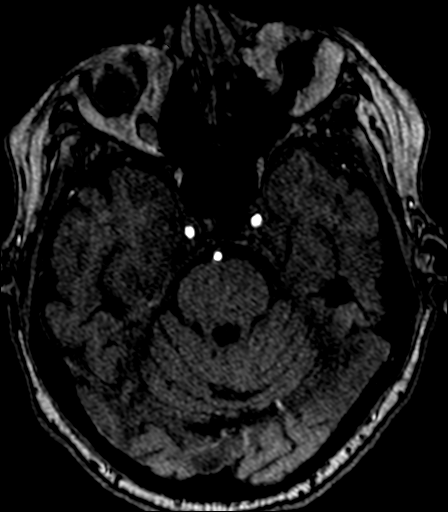
[im 117/169]
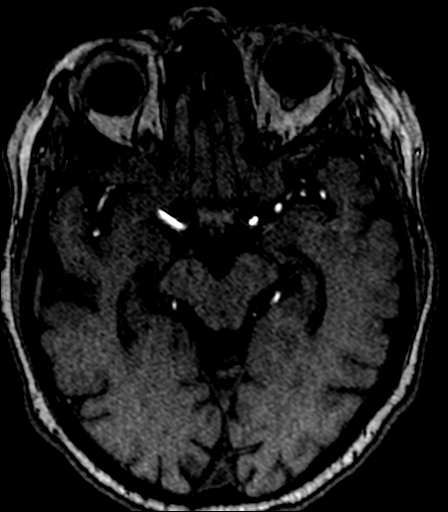
[im 139/169]
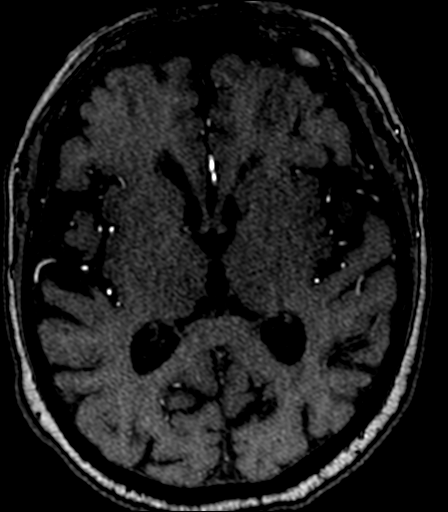
[im 143/169]
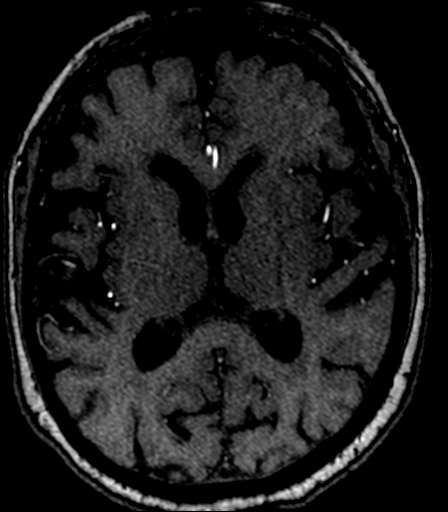
[im 161/169]
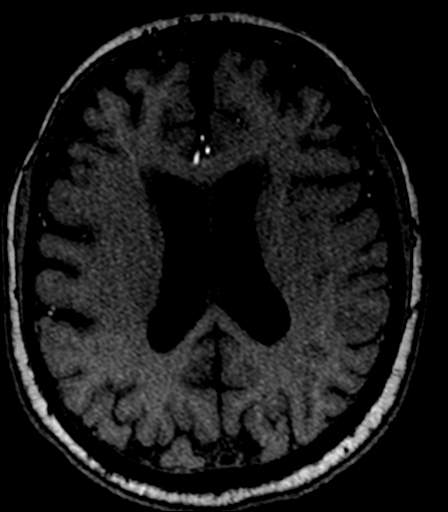

[Series 6: tof_3d_multi-slab_mip_tra · axial · 118.3mm · 0.35mm/px · 1 of 1 slices shown]
[im 1/1]
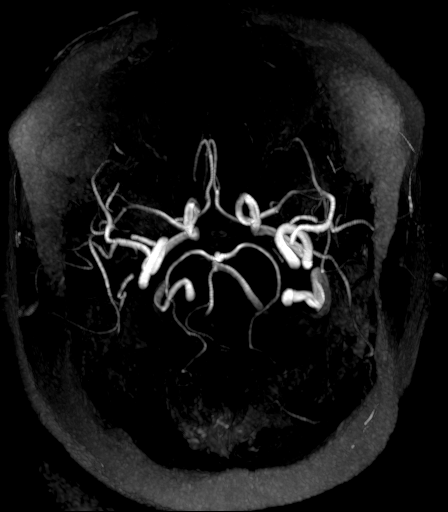

[20 of 48 positions shown; findings below may reference images not displayed]

FINDINGS: MRI HEAD FINDINGS

Brain: There is no evidence of an acute infarct, intracranial
hemorrhage, mass, midline shift, or extra-axial fluid collection. T2
hyperintensities in the cerebral white matter bilaterally are
nonspecific but compatible with chronic small vessel ischemic
disease which is mild for age. Generalized cerebral atrophy is
mild-to-moderate for age. No abnormal enhancement is identified. No
focal cerebellar insult is evident.

Vascular: Major intracranial vascular flow voids are preserved.

Skull and upper cervical spine: Unremarkable bone marrow signal.
Partially visualized postoperative changes in the cervical spine.
Bilateral C2-3 facet ankylosis.

Sinuses/Orbits: Bilateral cataract extraction. Minimal left
maxillary sinus mucosal thickening. Clear mastoid air cells.

Other: None.

MRA HEAD FINDINGS

The intracranial vertebral arteries are patent to the basilar. The
right PICA and left AICA appear dominant. Patent SCAs are seen
bilaterally. The basilar artery is widely patent. Posterior
communicating arteries are diminutive or absent. Both PCAs are
patent without evidence of a significant proximal stenosis.

The internal carotid arteries are patent from skull base to carotid
termini with mild irregularity but no evidence of a significant
stenosis. Apparent mild narrowing of the right ICA at the level of
the anterior genu on reformats is favored to be artifactual based on
source images. ACAs and MCAs are patent without evidence of a
proximal branch occlusion or significant A1 or M1 stenosis. Apparent
proximal right M2 stenosis is considered artifactual given normal
appearance on the contrast-enhanced neck MRA. No aneurysm is
identified.

MRA NECK FINDINGS

There is a normal variant aortic arch branching pattern with common
origin of the brachiocephalic and left common carotid arteries.
There is approximately 60% stenosis of the proximal left subclavian
artery.

The common carotid and cervical internal carotid arteries are patent
without evidence of a significant stenosis or dissection. Both
common carotid arteries are tortuous.

The vertebral arteries are patent with antegrade flow bilaterally
and with the left being mildly to moderately dominant. There is no
evidence of a significant vertebral artery stenosis on the left.
Assessment of the proximal right V1 segment is limited by motion
artifact on both the noncontrast time-of-flight MRA and on the
contrast-enhanced neck MRA with the possibility of a severe stenosis
raised based on the appearance on the contrasted MRA. The right
vertebral artery is otherwise normal in appearance throughout the
remainder of the neck.
IMPRESSION: 1. No acute intracranial abnormality.
2. Mild chronic small vessel ischemic disease and mild-to-moderate
cerebral atrophy.
3. No major intracranial arterial occlusion or significant proximal
intracranial stenosis.
4. Possible significant stenosis in the proximal right vertebral
artery with assessment limited by motion artifact. Consider neck CTA
for further evaluation.
5. Widely patent right vertebral artery.
6. Widely patent cervical carotid arteries.
7. 60% stenosis of the proximal left subclavian artery.

## 2021-02-11 IMAGING — MR MR MRA NECK WO/W CM
4 series · 31 of 48 positions shown · IV contrast (multihance)
Comparison: None.

CLINICAL DATA: Dizziness, abnormal gait, and visual
changes/diplopia.

EXAM:
MRI HEAD WITHOUT AND WITH CONTRAST
MRA HEAD WITHOUT CONTRAST
MRA NECK WITHOUT AND WITH CONTRAST
TECHNIQUE: Multiplanar, multiecho pulse sequences of the brain and surrounding
structures were obtained without and with intravenous contrast.
Angiographic images of the Circle of Willis were obtained using MRA
technique without intravenous contrast. Angiographic images of the
neck were obtained using MRA technique without and with intravenous
contrast. Carotid stenosis measurements (when applicable) are
obtained utilizing NASCET criteria, using the distal internal
carotid diameter as the denominator.
CONTRAST:  14mL MULTIHANCE GADOBENATE DIMEGLUMINE 529 MG/ML IV SOLN

[Series 4: fl_tof_2d · axial · 3.0mm · 0.39mm/px · z∈[-215,-97]mm · 9 of 60 slices shown]
[im 1/60]
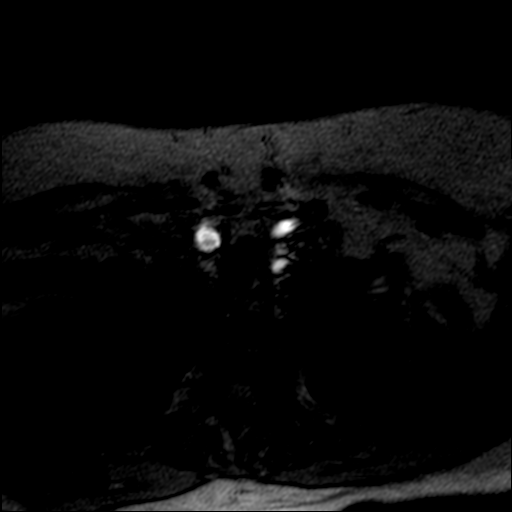
[im 8/60]
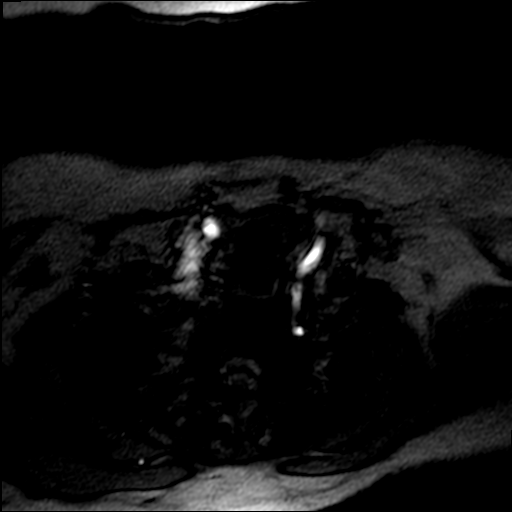
[im 15/60]
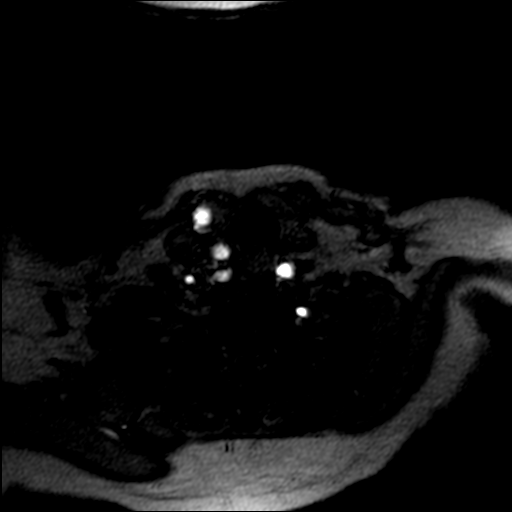
[im 23/60]
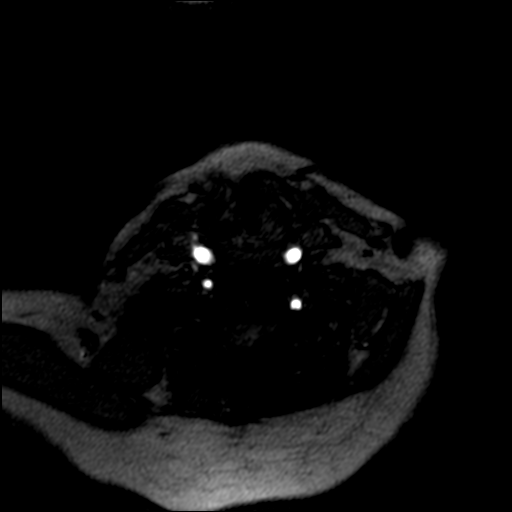
[im 30/60]
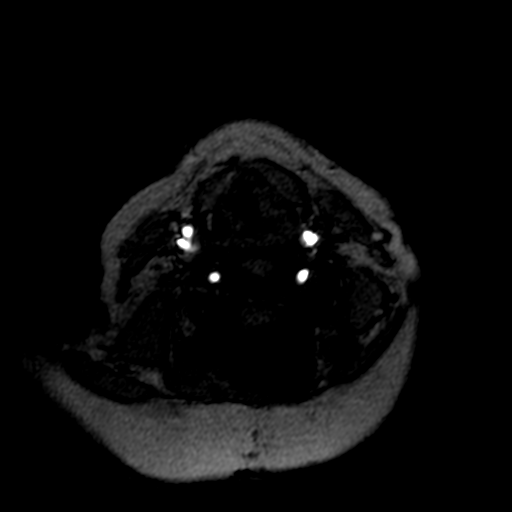
[im 37/60]
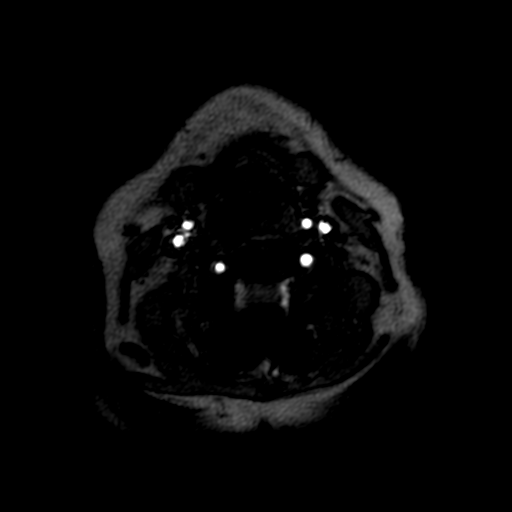
[im 45/60]
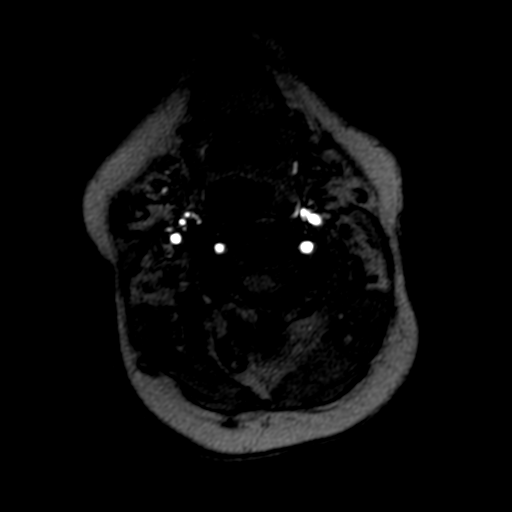
[im 52/60]
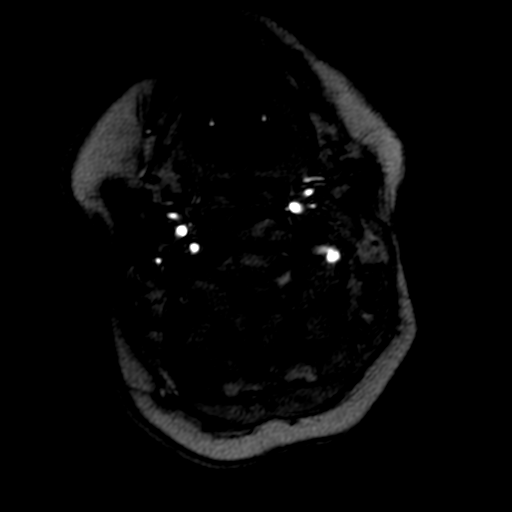
[im 60/60]
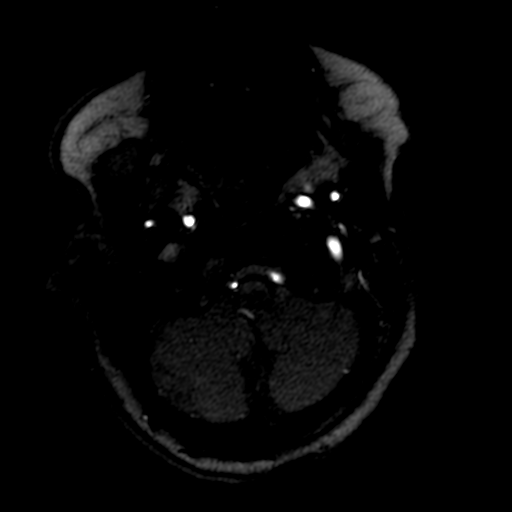

[Series 8: (id)_tt=1.0s · coronal · 0.8mm · 0.78mm/px · 9 of 96 slices shown (1 of 2)]
[im 1/96]
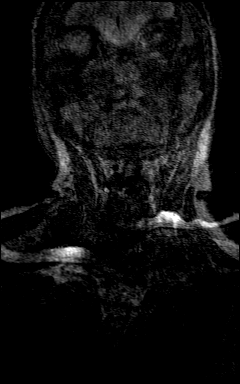
[im 16/96]
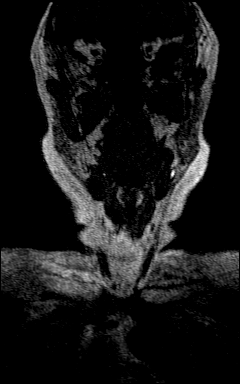
[im 32/96]
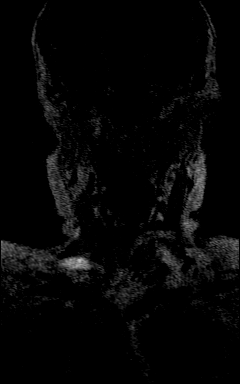
[im 40/96]
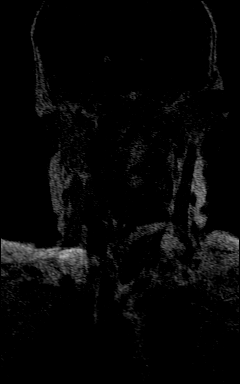
[im 48/96]
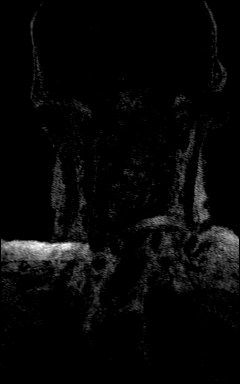
[im 56/96]
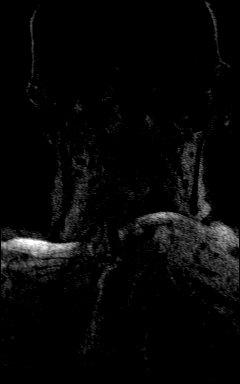
[im 64/96]
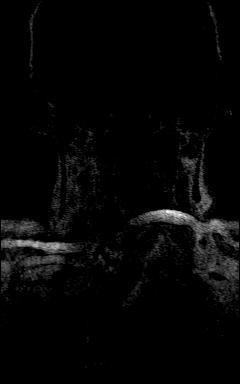
[im 80/96]
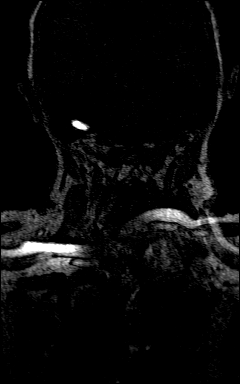
[im 96/96]
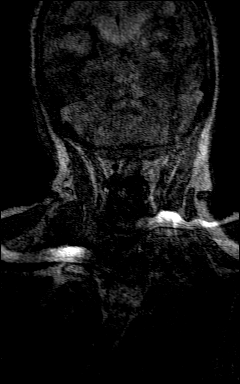

[Series 10: (id)_tt=1.0s · coronal · 0.8mm · 0.78mm/px · 9 of 92 slices shown (2 of 2)]
[im 1/92]
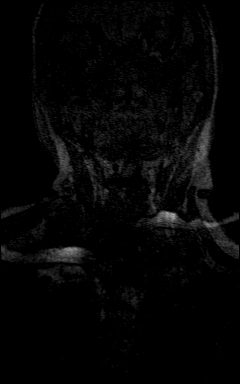
[im 16/92]
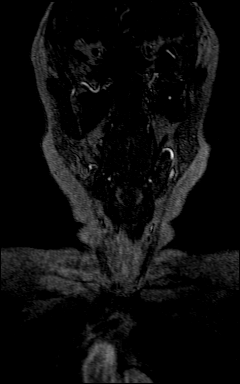
[im 31/92]
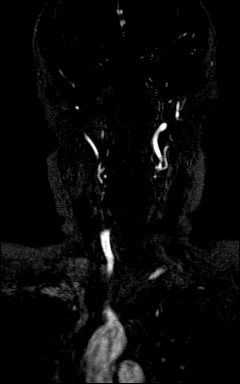
[im 38/92]
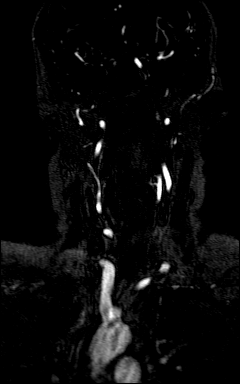
[im 46/92]
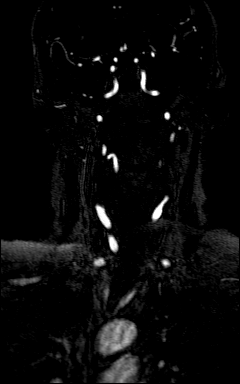
[im 54/92]
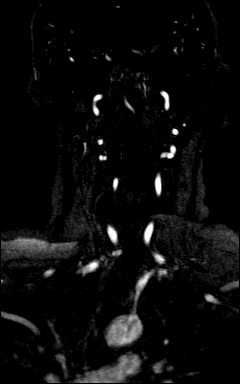
[im 61/92]
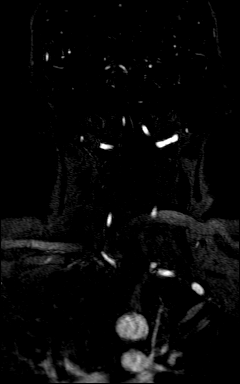
[im 76/92]
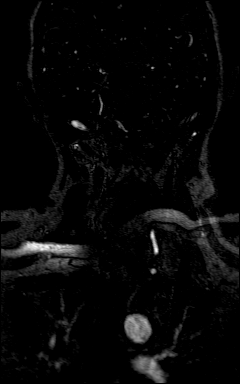
[im 92/92]
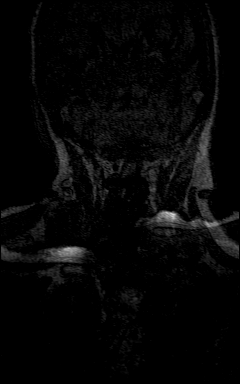

[Series 11: (id)_tt=1.0s_sub · coronal · 0.8mm · 0.78mm/px · 4 of 95 slices shown]
[im 1/95]
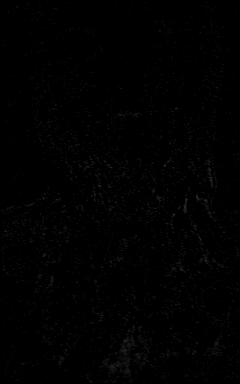
[im 16/95]
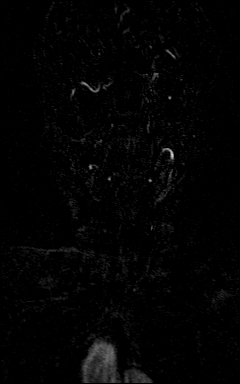
[im 48/95]
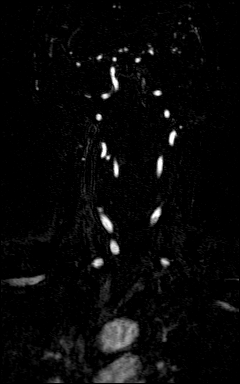
[im 79/95]
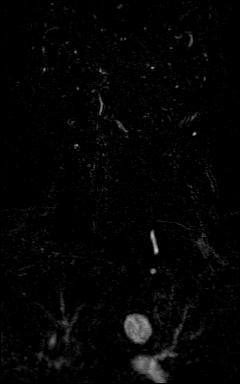

[31 of 48 positions shown; findings below may reference images not displayed]

FINDINGS: MRI HEAD FINDINGS

Brain: There is no evidence of an acute infarct, intracranial
hemorrhage, mass, midline shift, or extra-axial fluid collection. T2
hyperintensities in the cerebral white matter bilaterally are
nonspecific but compatible with chronic small vessel ischemic
disease which is mild for age. Generalized cerebral atrophy is
mild-to-moderate for age. No abnormal enhancement is identified. No
focal cerebellar insult is evident.

Vascular: Major intracranial vascular flow voids are preserved.

Skull and upper cervical spine: Unremarkable bone marrow signal.
Partially visualized postoperative changes in the cervical spine.
Bilateral C2-3 facet ankylosis.

Sinuses/Orbits: Bilateral cataract extraction. Minimal left
maxillary sinus mucosal thickening. Clear mastoid air cells.

Other: None.

MRA HEAD FINDINGS

The intracranial vertebral arteries are patent to the basilar. The
right PICA and left AICA appear dominant. Patent SCAs are seen
bilaterally. The basilar artery is widely patent. Posterior
communicating arteries are diminutive or absent. Both PCAs are
patent without evidence of a significant proximal stenosis.

The internal carotid arteries are patent from skull base to carotid
termini with mild irregularity but no evidence of a significant
stenosis. Apparent mild narrowing of the right ICA at the level of
the anterior genu on reformats is favored to be artifactual based on
source images. ACAs and MCAs are patent without evidence of a
proximal branch occlusion or significant A1 or M1 stenosis. Apparent
proximal right M2 stenosis is considered artifactual given normal
appearance on the contrast-enhanced neck MRA. No aneurysm is
identified.

MRA NECK FINDINGS

There is a normal variant aortic arch branching pattern with common
origin of the brachiocephalic and left common carotid arteries.
There is approximately 60% stenosis of the proximal left subclavian
artery.

The common carotid and cervical internal carotid arteries are patent
without evidence of a significant stenosis or dissection. Both
common carotid arteries are tortuous.

The vertebral arteries are patent with antegrade flow bilaterally
and with the left being mildly to moderately dominant. There is no
evidence of a significant vertebral artery stenosis on the left.
Assessment of the proximal right V1 segment is limited by motion
artifact on both the noncontrast time-of-flight MRA and on the
contrast-enhanced neck MRA with the possibility of a severe stenosis
raised based on the appearance on the contrasted MRA. The right
vertebral artery is otherwise normal in appearance throughout the
remainder of the neck.
IMPRESSION: 1. No acute intracranial abnormality.
2. Mild chronic small vessel ischemic disease and mild-to-moderate
cerebral atrophy.
3. No major intracranial arterial occlusion or significant proximal
intracranial stenosis.
4. Possible significant stenosis in the proximal right vertebral
artery with assessment limited by motion artifact. Consider neck CTA
for further evaluation.
5. Widely patent right vertebral artery.
6. Widely patent cervical carotid arteries.
7. 60% stenosis of the proximal left subclavian artery.

## 2021-02-11 MED ORDER — HYDROCODONE-ACETAMINOPHEN 5-325 MG PO TABS
1.0000 | ORAL_TABLET | Freq: Every day | ORAL | 0 refills | Status: DC
Start: 2021-02-11 — End: 2021-03-14

## 2021-02-11 MED ORDER — GADOBENATE DIMEGLUMINE 529 MG/ML IV SOLN
14.0000 mL | Freq: Once | INTRAVENOUS | Status: AC | PRN
  Administered 2021-02-11: 14 mL via INTRAVENOUS

## 2021-02-11 NOTE — Telephone Encounter (Signed)
Patient left voicemail on clinical intake voicemail wanting refill on Hydrocodone 5/325mg . Last script for medication was sent 01/14/2021. Contract needs to be updated. No contract on file since 10/25/2019. Medication pended and sent to Sherrie Mustache, NP for approval.

## 2021-02-14 ENCOUNTER — Encounter: Payer: Self-pay | Admitting: Internal Medicine

## 2021-02-14 ENCOUNTER — Inpatient Hospital Stay (HOSPITAL_BASED_OUTPATIENT_CLINIC_OR_DEPARTMENT_OTHER): Payer: Medicare Other | Admitting: Internal Medicine

## 2021-02-14 ENCOUNTER — Other Ambulatory Visit: Payer: Self-pay

## 2021-02-14 ENCOUNTER — Inpatient Hospital Stay: Payer: Medicare Other

## 2021-02-14 VITALS — BP 124/87 | HR 91 | Resp 16

## 2021-02-14 DIAGNOSIS — D45 Polycythemia vera: Secondary | ICD-10-CM

## 2021-02-14 DIAGNOSIS — E785 Hyperlipidemia, unspecified: Secondary | ICD-10-CM | POA: Diagnosis not present

## 2021-02-14 DIAGNOSIS — L4050 Arthropathic psoriasis, unspecified: Secondary | ICD-10-CM | POA: Diagnosis not present

## 2021-02-14 DIAGNOSIS — E039 Hypothyroidism, unspecified: Secondary | ICD-10-CM | POA: Diagnosis not present

## 2021-02-14 DIAGNOSIS — R0602 Shortness of breath: Secondary | ICD-10-CM | POA: Diagnosis not present

## 2021-02-14 DIAGNOSIS — G4730 Sleep apnea, unspecified: Secondary | ICD-10-CM | POA: Diagnosis not present

## 2021-02-14 LAB — CBC WITH DIFFERENTIAL (CANCER CENTER ONLY)
Abs Immature Granulocytes: 0.01 10*3/uL (ref 0.00–0.07)
Basophils Absolute: 0 10*3/uL (ref 0.0–0.1)
Basophils Relative: 0 %
Eosinophils Absolute: 0 10*3/uL (ref 0.0–0.5)
Eosinophils Relative: 0 %
HCT: 51.2 % — ABNORMAL HIGH (ref 36.0–46.0)
Hemoglobin: 15.8 g/dL — ABNORMAL HIGH (ref 12.0–15.0)
Immature Granulocytes: 0 %
Lymphocytes Relative: 34 %
Lymphs Abs: 2 10*3/uL (ref 0.7–4.0)
MCH: 26.1 pg (ref 26.0–34.0)
MCHC: 30.9 g/dL (ref 30.0–36.0)
MCV: 84.6 fL (ref 80.0–100.0)
Monocytes Absolute: 0.6 10*3/uL (ref 0.1–1.0)
Monocytes Relative: 11 %
Neutro Abs: 3.1 10*3/uL (ref 1.7–7.7)
Neutrophils Relative %: 55 %
Platelet Count: 254 10*3/uL (ref 150–400)
RBC: 6.05 MIL/uL — ABNORMAL HIGH (ref 3.87–5.11)
RDW: 14.1 % (ref 11.5–15.5)
WBC Count: 5.7 10*3/uL (ref 4.0–10.5)
nRBC: 0 % (ref 0.0–0.2)

## 2021-02-14 LAB — LACTATE DEHYDROGENASE: LDH: 185 U/L (ref 98–192)

## 2021-02-14 NOTE — Progress Notes (Signed)
[facility] Telephone:([phone]) [phone]   Fax:(336) [phone]  OFFICE PROGRESS NOTE  [name], NP [place] [address]  DIAGNOSIS: Polycythemia vera with positive JAK2 mutation diagnosed in April 2022  PRIOR THERAPY: None  CURRENT THERAPY: Phlebotomy on as-needed basis  INTERVAL HISTORY: Sydney Reid 80 y.o. female returns to the clinic today for follow-up visit.  The patient is feeling fine today with no concerning complaints except for arthralgia.  She denied having any fatigue or weakness.  She has occasional dizzy spells.  She denied having any chest pain, shortness of breath, cough or hemoptysis.  She denied having any recent weight loss or night sweats.  She had molecular studies for JAK2 mutation and it was positive consistent with a diagnosis of polycythemia vera.  The patient is here today for evaluation and recommendation regarding her condition.  MEDICAL HISTORY: Past Medical History:  Diagnosis Date  . H/O total knee replacement 2001   Right  . High cholesterol   . History of tonsillectomy and adenoidectomy 1946  . Psoriatic arthritis ([place]) 1990  . Sleep apnea with use of continuous positive airway pressure (CPAP) 2004  . Thyroid disease     ALLERGIES:  is allergic to codeine and statins.  MEDICATIONS:  Current Outpatient Medications  Medication Sig Dispense Refill  . ammonium lactate (LAC-HYDRIN) 12 % lotion Apply topically daily.    Marland Kitchen aspirin EC 81 MG tablet Take 81 mg by mouth daily. Swallow whole.    . calcium carbonate (TUMS - DOSED IN MG ELEMENTAL CALCIUM) 500 MG chewable tablet Chew 1 tablet by mouth as needed for indigestion or heartburn.    . Cholecalciferol (VITAMIN D) 50 MCG (2000 UT) CAPS Take 5,000 Units by mouth daily.    Marland Kitchen etanercept (ENBREL) 50 MG/ML injection Inject 50 mg into the skin every Sunday.     . ezetimibe (ZETIA) 10 MG tablet Take 1 tablet (10 mg total) by mouth daily. 90 tablet 1  .  HYDROcodone-acetaminophen (NORCO) 5-325 MG tablet Take 1 tablet by mouth at bedtime. 30 tablet 0  . levothyroxine (SYNTHROID) 88 MCG tablet TAKE 1 TABLET BY MOUTH ONCE DAILY 1/2 HOUR BEFORE  BREAKFAST ON AN EMPTY  STOMACH FOR THYROID 90 tablet 3   No current facility-administered medications for this visit.    SURGICAL HISTORY:  Past Surgical History:  Procedure Laterality Date  . FOOT SURGERY     fused arch in left foot  . SPINE SURGERY    . TOTAL KNEE ARTHROPLASTY Left     REVIEW OF SYSTEMS:  A comprehensive review of systems was negative except for: Musculoskeletal: positive for arthralgias Neurological: positive for dizziness   PHYSICAL EXAMINATION: General appearance: alert, cooperative and no distress Head: Normocephalic, without obvious abnormality, atraumatic Neck: no adenopathy, no JVD, supple, symmetrical, trachea midline and thyroid not enlarged, symmetric, no tenderness/mass/nodules Lymph nodes: Cervical, supraclavicular, and axillary nodes normal. Resp: clear to auscultation bilaterally Back: symmetric, no curvature. ROM normal. No CVA tenderness. Cardio: regular rate and rhythm, S1, S2 normal, no murmur, click, rub or gallop GI: soft, non-tender; bowel sounds normal; no masses,  no organomegaly Extremities: extremities normal, atraumatic, no cyanosis or edema  ECOG PERFORMANCE STATUS: 1 - Symptomatic but completely ambulatory  Blood pressure 127/75, pulse 86, temperature 97.7 F (36.5 C), temperature source Tympanic, resp. rate 18, height 5\' 5"  (1.651 m), weight 150 lb 6.4 oz (68.2 kg), SpO2 100 %.  LABORATORY DATA: Lab Results  Component Value Date   WBC 5.7 02/14/2021   HGB 15.8 (H) 02/14/2021   HCT 51.2 (H) 02/14/2021   MCV 84.6 02/14/2021   PLT 254 02/14/2021      Chemistry      Component Value Date/Time   NA 140 01/29/2021 1125   NA 138 04/02/2016 0000   K 4.5 01/29/2021 1125   CL 106 01/29/2021 1125   CO2 23 01/29/2021 1125   BUN 17 01/29/2021  1125   BUN 17 04/02/2016 0000   CREATININE 1.16 (H) 01/29/2021 1125   CREATININE 0.98 (H) 01/21/2021 1630   GLU 93 04/02/2016 0000      Component Value Date/Time   CALCIUM 9.2 01/29/2021 1125   ALKPHOS 71 01/29/2021 1125   AST 24 01/29/2021 1125   ALT 28 01/29/2021 1125   BILITOT 0.9 01/29/2021 1125       RADIOGRAPHIC STUDIES: MR Angiogram Head Wo Contrast  Result Date: 02/11/2021 CLINICAL DATA:  Dizziness, abnormal gait, and visual changes/diplopia. EXAM: MRI HEAD WITHOUT AND WITH CONTRAST MRA HEAD WITHOUT CONTRAST MRA NECK WITHOUT AND WITH CONTRAST TECHNIQUE: Multiplanar, multiecho pulse sequences of the brain and surrounding structures were obtained without and with intravenous contrast. Angiographic images of the Circle of Willis were obtained using MRA technique without intravenous contrast. Angiographic images of the neck were obtained using MRA technique without and with intravenous contrast. Carotid stenosis measurements (when applicable) are obtained utilizing NASCET criteria, using the distal internal carotid diameter as the denominator. CONTRAST:  2mL MULTIHANCE GADOBENATE DIMEGLUMINE 529 MG/ML IV SOLN COMPARISON:  None. FINDINGS: MRI HEAD FINDINGS Brain: There is no evidence of an acute infarct, intracranial hemorrhage, mass, midline shift, or extra-axial fluid collection. T2 hyperintensities in the cerebral white matter bilaterally are nonspecific but compatible with chronic small vessel ischemic disease which is mild for age. Generalized cerebral atrophy is mild-to-moderate for age. No abnormal enhancement is identified. No focal cerebellar insult is evident. Vascular: Major intracranial vascular flow voids are preserved. Skull and upper cervical spine: Unremarkable bone marrow signal. Partially visualized postoperative changes in the cervical spine. Bilateral C2-3 facet ankylosis. Sinuses/Orbits: Bilateral cataract extraction. Minimal left maxillary sinus mucosal thickening. Clear  mastoid air cells. Other: None. MRA HEAD FINDINGS The intracranial vertebral arteries are patent to the basilar. The right PICA and left AICA appear dominant. Patent SCAs are seen bilaterally. The basilar artery is widely patent. Posterior communicating arteries are diminutive or absent. Both PCAs are patent without evidence of a significant proximal stenosis. The internal carotid arteries are patent from skull base to carotid termini with mild irregularity but no evidence of a significant stenosis. Apparent mild narrowing of the right ICA at the level of the anterior genu on reformats is favored to be artifactual based on source images. ACAs and MCAs are patent without evidence of a proximal branch occlusion or significant A1 or M1 stenosis. Apparent proximal right M2 stenosis is considered artifactual given normal appearance on the contrast-enhanced neck MRA. No aneurysm is identified. MRA NECK FINDINGS There is a normal variant aortic arch branching pattern with common origin of the brachiocephalic and left common carotid arteries. There is approximately 60% stenosis of the proximal left subclavian artery. The common carotid and cervical internal carotid arteries are patent without evidence of a significant stenosis or dissection. Both common carotid arteries are tortuous. The vertebral arteries are patent with antegrade flow bilaterally and with the left being mildly to moderately dominant. There is no evidence of a significant vertebral artery stenosis on the left.  Assessment of the proximal right V1 segment is limited by motion artifact on both the noncontrast time-of-flight MRA and on the contrast-enhanced neck MRA with the possibility of a severe stenosis raised based on the appearance on the contrasted MRA. The right vertebral artery is otherwise normal in appearance throughout the remainder of the neck. IMPRESSION: 1. No acute intracranial abnormality. 2. Mild chronic small vessel ischemic disease and  mild-to-moderate cerebral atrophy. 3. No major intracranial arterial occlusion or significant proximal intracranial stenosis. 4. Possible significant stenosis in the proximal right vertebral artery with assessment limited by motion artifact. Consider neck CTA for further evaluation. 5. Widely patent right vertebral artery. 6. Widely patent cervical carotid arteries. 7. 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 Electronically Signed   By: Logan Bores M.D.   On: 02/11/2021 16:54   MR Angiogram Neck W Wo Contrast  Result Date: 02/11/2021 CLINICAL DATA:  Dizziness, abnormal gait, and visual changes/diplopia. EXAM: MRI HEAD WITHOUT AND WITH CONTRAST MRA HEAD WITHOUT CONTRAST MRA NECK WITHOUT AND WITH CONTRAST TECHNIQUE: Multiplanar, multiecho pulse sequences of the brain and surrounding structures were obtained without and with intravenous contrast. Angiographic images of the Circle of Willis were obtained using MRA technique without intravenous contrast. Angiographic images of the neck were obtained using MRA technique without and with intravenous contrast. Carotid stenosis measurements (when applicable) are obtained utilizing NASCET criteria, using the distal internal carotid diameter as the denominator. CONTRAST:  58mL MULTIHANCE GADOBENATE DIMEGLUMINE 529 MG/ML IV SOLN COMPARISON:  None. FINDINGS: MRI HEAD FINDINGS Brain: There is no evidence of an acute infarct, intracranial hemorrhage, mass, midline shift, or extra-axial fluid collection. T2 hyperintensities in the cerebral white matter bilaterally are nonspecific but compatible with chronic small vessel ischemic disease which is mild for age. Generalized cerebral atrophy is mild-to-moderate for age. No abnormal enhancement is identified. No focal cerebellar insult is evident. Vascular: Major intracranial vascular flow voids are preserved. Skull and upper cervical spine: Unremarkable bone marrow signal. Partially visualized postoperative changes in  the cervical spine. Bilateral C2-3 facet ankylosis. Sinuses/Orbits: Bilateral cataract extraction. Minimal left maxillary sinus mucosal thickening. Clear mastoid air cells. Other: None. MRA HEAD FINDINGS The intracranial vertebral arteries are patent to the basilar. The right PICA and left AICA appear dominant. Patent SCAs are seen bilaterally. The basilar artery is widely patent. Posterior communicating arteries are diminutive or absent. Both PCAs are patent without evidence of a significant proximal stenosis. The internal carotid arteries are patent from skull base to carotid termini with mild irregularity but no evidence of a significant stenosis. Apparent mild narrowing of the right ICA at the level of the anterior genu on reformats is favored to be artifactual based on source images. ACAs and MCAs are patent without evidence of a proximal branch occlusion or significant A1 or M1 stenosis. Apparent proximal right M2 stenosis is considered artifactual given normal appearance on the contrast-enhanced neck MRA. No aneurysm is identified. MRA NECK FINDINGS There is a normal variant aortic arch branching pattern with common origin of the brachiocephalic and left common carotid arteries. There is approximately 60% stenosis of the proximal left subclavian artery. The common carotid and cervical internal carotid arteries are patent without evidence of a significant stenosis or dissection. Both common carotid arteries are tortuous. The vertebral arteries are patent with antegrade flow bilaterally and with the left being mildly to moderately dominant. There is no evidence of a significant vertebral artery stenosis on the left. Assessment of the proximal right V1 segment is limited  by motion artifact on both the noncontrast time-of-flight MRA and on the contrast-enhanced neck MRA with the possibility of a severe stenosis raised based on the appearance on the contrasted MRA. The right vertebral artery is otherwise normal in  appearance throughout the remainder of the neck. IMPRESSION: 1. No acute intracranial abnormality. 2. Mild chronic small vessel ischemic disease and mild-to-moderate cerebral atrophy. 3. No major intracranial arterial occlusion or significant proximal intracranial stenosis. 4. Possible significant stenosis in the proximal right vertebral artery with assessment limited by motion artifact. Consider neck CTA for further evaluation. 5. Widely patent right vertebral artery. 6. Widely patent cervical carotid arteries. 7. 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 Electronically Signed   By: Logan Bores M.D.   On: 02/11/2021 16:54   MR Brain W Wo Contrast  Result Date: 02/11/2021 CLINICAL DATA:  Dizziness, abnormal gait, and visual changes/diplopia. EXAM: MRI HEAD WITHOUT AND WITH CONTRAST MRA HEAD WITHOUT CONTRAST MRA NECK WITHOUT AND WITH CONTRAST TECHNIQUE: Multiplanar, multiecho pulse sequences of the brain and surrounding structures were obtained without and with intravenous contrast. Angiographic images of the Circle of Willis were obtained using MRA technique without intravenous contrast. Angiographic images of the neck were obtained using MRA technique without and with intravenous contrast. Carotid stenosis measurements (when applicable) are obtained utilizing NASCET criteria, using the distal internal carotid diameter as the denominator. CONTRAST:  28mL MULTIHANCE GADOBENATE DIMEGLUMINE 529 MG/ML IV SOLN COMPARISON:  None. FINDINGS: MRI HEAD FINDINGS Brain: There is no evidence of an acute infarct, intracranial hemorrhage, mass, midline shift, or extra-axial fluid collection. T2 hyperintensities in the cerebral white matter bilaterally are nonspecific but compatible with chronic small vessel ischemic disease which is mild for age. Generalized cerebral atrophy is mild-to-moderate for age. No abnormal enhancement is identified. No focal cerebellar insult is evident. Vascular: Major intracranial  vascular flow voids are preserved. Skull and upper cervical spine: Unremarkable bone marrow signal. Partially visualized postoperative changes in the cervical spine. Bilateral C2-3 facet ankylosis. Sinuses/Orbits: Bilateral cataract extraction. Minimal left maxillary sinus mucosal thickening. Clear mastoid air cells. Other: None. MRA HEAD FINDINGS The intracranial vertebral arteries are patent to the basilar. The right PICA and left AICA appear dominant. Patent SCAs are seen bilaterally. The basilar artery is widely patent. Posterior communicating arteries are diminutive or absent. Both PCAs are patent without evidence of a significant proximal stenosis. The internal carotid arteries are patent from skull base to carotid termini with mild irregularity but no evidence of a significant stenosis. Apparent mild narrowing of the right ICA at the level of the anterior genu on reformats is favored to be artifactual based on source images. ACAs and MCAs are patent without evidence of a proximal branch occlusion or significant A1 or M1 stenosis. Apparent proximal right M2 stenosis is considered artifactual given normal appearance on the contrast-enhanced neck MRA. No aneurysm is identified. MRA NECK FINDINGS There is a normal variant aortic arch branching pattern with common origin of the brachiocephalic and left common carotid arteries. There is approximately 60% stenosis of the proximal left subclavian artery. The common carotid and cervical internal carotid arteries are patent without evidence of a significant stenosis or dissection. Both common carotid arteries are tortuous. The vertebral arteries are patent with antegrade flow bilaterally and with the left being mildly to moderately dominant. There is no evidence of a significant vertebral artery stenosis on the left. Assessment of the proximal right V1 segment is limited by motion artifact on both the noncontrast time-of-flight MRA and  on the contrast-enhanced neck MRA  with the possibility of a severe stenosis raised based on the appearance on the contrasted MRA. The right vertebral artery is otherwise normal in appearance throughout the remainder of the neck. IMPRESSION: 1. No acute intracranial abnormality. 2. Mild chronic small vessel ischemic disease and mild-to-moderate cerebral atrophy. 3. No major intracranial arterial occlusion or significant proximal intracranial stenosis. 4. Possible significant stenosis in the proximal right vertebral artery with assessment limited by motion artifact. Consider neck CTA for further evaluation. 5. Widely patent right vertebral artery. 6. Widely patent cervical carotid arteries. 7. 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 Electronically Signed   By: Logan Bores M.D.   On: 02/11/2021 16:54    ASSESSMENT AND PLAN: This is a very pleasant 80 years old white female recently diagnosed with polycythemia vera with positive JAK2 mutation. I discussed the lab results with the patient today. I recommended for her to proceed with phlebotomy to keep her hematocrit less than 45%. She will proceed with the first phlebotomy today. She was also advised to take baby aspirin on a daily basis I will see her back for follow-up visit in 1 months for evaluation and repeat blood work and phlebotomy if needed. She was advised to call immediately if she has any other concerning symptoms in the interval. The patient voices understanding of current disease status and treatment options and is in agreement with the current care plan.  All questions were answered. The patient knows to call the clinic with any problems, questions or concerns. We can certainly see the patient much sooner if necessary. The total time spent in the appointment was 20 minutes.  Disclaimer: This note was dictated with voice recognition software. Similar sounding words can inadvertently be transcribed and may not be corrected upon review.

## 2021-02-14 NOTE — Patient Instructions (Signed)

## 2021-02-15 ENCOUNTER — Telehealth: Payer: Self-pay | Admitting: Internal Medicine

## 2021-02-15 NOTE — Telephone Encounter (Signed)
Scheduled per los. Called and spoke with patient. Confirmed appt 

## 2021-02-18 ENCOUNTER — Encounter: Payer: Self-pay | Admitting: Nurse Practitioner

## 2021-02-18 ENCOUNTER — Ambulatory Visit (INDEPENDENT_AMBULATORY_CARE_PROVIDER_SITE_OTHER): Payer: Medicare Other | Admitting: Nurse Practitioner

## 2021-02-18 ENCOUNTER — Other Ambulatory Visit: Payer: Self-pay

## 2021-02-18 VITALS — BP 128/80 | HR 89 | Temp 96.9°F | Ht 65.0 in | Wt 150.2 lb

## 2021-02-18 DIAGNOSIS — R0981 Nasal congestion: Secondary | ICD-10-CM | POA: Diagnosis not present

## 2021-02-18 NOTE — Patient Instructions (Addendum)
neti pot daily or sinus rinse daily  flonase 1 spray into both nares up to twice daily as needed for congestion-- OTC Plain nasal saline spray throughout the day as needed May use tylenol 325 mg 2 tablets every 6 hours as needed aches and pains or sore throat- MAX tylenol is 3000 mg in 24 hours  Mucinex DM by mouth twice daily as needed for cough and congestion with full glass of water  Keep well hydrated Avoid forcefully blowing nose Prop yourself up with some pillows to sleep  Start Claritin or zyrtec 10 mg daily - OTC - okay to use generic

## 2021-02-18 NOTE — Progress Notes (Signed)
Careteam: Patient Care Team: Sydney Chandler, NP as PCP - General (Geriatric Medicine) Sydney Rakers, MD as Referring Physician (Ophthalmology)  PLACE OF SERVICE:  [facility]  Advanced Directive information    Allergies  Allergen Reactions  . Codeine Nausea And Vomiting    Weakness and dysequilibrium  . Statins Hives    Myalgias     Chief Complaint  Patient presents with  . Acute Visit    Dry cough, scratchy throat, and left ear ache since last week. Patient is constantly blowing her nose. FYI patient took 1000 mg of amoxicillin this am, and 500 mg about 12:30 pm (patient had left over from an old rx), patient has 4 pills left.      HPI: Patient is a 80 y.o. female for cough and congestion for 5 days. Cough is worse laying down vs sitting up.  Her husband wanted to make sure it was just a cold or allergies.  Has not taken any allergy medication.  No fever, chills or body aches.  Reports increase fatigue. Slight earache on left.  No chest congestion.  Nasal congestion.  Scratchy throat.  Sneezing a lot   Review of Systems:  Review of Systems  Constitutional: Positive for malaise/fatigue. Negative for chills, fever and weight loss.  HENT: Positive for congestion, ear pain (mild) and sore throat (mild). Negative for ear discharge, nosebleeds and tinnitus.   Respiratory: Positive for cough. Negative for sputum production, shortness of breath and wheezing.   Cardiovascular: Negative for chest pain, palpitations and leg swelling.  Neurological: Positive for dizziness (no worse than prior ). Negative for headaches.  Endo/Heme/Allergies: Positive for environmental allergies.    Past Medical History:  Diagnosis Date  . H/O total knee replacement 2001   Right  . High cholesterol   . History of tonsillectomy and adenoidectomy 1946  . Psoriatic arthritis ([place]) 1990  . Sleep apnea with use of continuous positive airway pressure (CPAP) 2004  . Thyroid disease     Past Surgical History:  Procedure Laterality Date  . FOOT SURGERY     fused arch in left foot  . SPINE SURGERY    . TOTAL KNEE ARTHROPLASTY Left    Social History:   reports that she quit smoking about 47 years ago. Her smoking use included cigarettes. She has a 36.00 pack-year smoking history. She has never used smokeless tobacco. She reports current alcohol use of about 1.0 - 2.0 standard drink of alcohol per week. She reports that she does not use drugs.  Family History  Problem Relation Age of Onset  . Heart attack Father   . Heart disease Sister     Medications: Patient's Medications  New Prescriptions   No medications on file  Previous Medications   AMMONIUM LACTATE (LAC-HYDRIN) 12 % LOTION    Apply topically as needed.   ASPIRIN EC 81 MG TABLET    Take 81 mg by mouth daily. Swallow whole.   CHOLECALCIFEROL (VITAMIN D) 50 MCG (2000 UT) CAPS    Take 5,000 Units by mouth daily.   ETANERCEPT (ENBREL) 50 MG/ML INJECTION    Inject 50 mg into the skin every Sunday.    EZETIMIBE (ZETIA) 10 MG TABLET    Take 1 tablet (10 mg total) by mouth daily.   HYDROCODONE-ACETAMINOPHEN (NORCO) 5-325 MG TABLET    Take 1 tablet by mouth at bedtime.   LEVOTHYROXINE (SYNTHROID) 88 MCG TABLET    TAKE 1 TABLET BY MOUTH ONCE DAILY 1/2 HOUR  BEFORE  BREAKFAST ON AN EMPTY  STOMACH FOR THYROID  Modified Medications   No medications on file  Discontinued Medications   CALCIUM CARBONATE (TUMS - DOSED IN MG ELEMENTAL CALCIUM) 500 MG CHEWABLE TABLET    Chew 1 tablet by mouth as needed for indigestion or heartburn.    Physical Exam:  Vitals:   02/18/21 1330  BP: 128/80  Pulse: 89  Temp: (!) 96.9 F (36.1 C)  TempSrc: Temporal  SpO2: 98%  Weight: 150 lb 3.2 oz (68.1 kg)  Height: 5\' 5"  (1.651 m)   Body mass index is 24.99 kg/m. Wt Readings from Last 3 Encounters:  02/18/21 150 lb 3.2 oz (68.1 kg)  02/14/21 150 lb 6.4 oz (68.2 kg)  01/29/21 152 lb (68.9 kg)    Physical Exam Constitutional:       General: She is not in acute distress.    Appearance: She is well-developed. She is not diaphoretic.  HENT:     Head: Normocephalic and atraumatic.     Right Ear: Tympanic membrane, ear canal and external ear normal. There is no impacted cerumen.     Left Ear: Tympanic membrane, ear canal and external ear normal. There is no impacted cerumen.     Nose: Congestion and rhinorrhea present.     Mouth/Throat:     Pharynx: No oropharyngeal exudate.  Eyes:     Conjunctiva/sclera: Conjunctivae normal.     Pupils: Pupils are equal, round, and reactive to light.  Cardiovascular:     Rate and Rhythm: Normal rate and regular rhythm.     Heart sounds: Normal heart sounds.  Pulmonary:     Effort: Pulmonary effort is normal.     Breath sounds: Normal breath sounds.  Abdominal:     General: Bowel sounds are normal.     Palpations: Abdomen is soft.  Musculoskeletal:        General: No tenderness.     Cervical back: Normal range of motion and neck supple.  Skin:    General: Skin is warm and dry.  Neurological:     Mental Status: She is alert and oriented to person, place, and time.     Labs reviewed: Basic Metabolic Panel: Recent Labs    08/20/20 0952 01/21/21 1630 01/29/21 1125  NA 139 141 140  K 4.4 4.4 4.5  CL 107 108 106  CO2 25 22 23   GLUCOSE 91 90 102*  BUN 18 16 17   CREATININE 1.12* 0.98* 1.16*  CALCIUM 9.4 9.8 9.2  TSH  --  0.58  --    Liver Function Tests: Recent Labs    08/20/20 0952 01/21/21 1630 01/29/21 1125  AST 17 19 24   ALT 15 18 28   ALKPHOS  --   --  71  BILITOT 0.8 0.6 0.9  PROT 7.6 8.0 8.3*  ALBUMIN  --   --  4.2   No results for input(s): LIPASE, AMYLASE in the last 8760 hours. No results for input(s): AMMONIA in the last 8760 hours. CBC: Recent Labs    01/21/21 1630 01/29/21 1125 02/14/21 0921  WBC 7.2 6.9 5.7  NEUTROABS 4,630 5.7 3.1  HGB 15.9* 14.9 15.8*  HCT 50.2* 48.3* 51.2*  MCV 83.4 84.6 84.6  PLT 237 200 254   Lipid  Panel: Recent Labs    08/20/20 0952  CHOL 232*  HDL 47*  LDLCALC 153*  TRIG 186*  CHOLHDL 4.9   TSH: Recent Labs    01/21/21 1630  TSH 0.58   A1C:  No results found for: HGBA1C   Assessment/Plan 1. Nasal congestion Declines COVID test, overall she is feeling well. Suspect symptoms due to allergies or other URI. Does not have fever or chills. Did educate her on using precautions to decrease spread if URI. Supportive care recommended at this time.  neti pot daily or sinus rinse daily  flonase 1 spray into both nares up to twice daily as needed for congestion-- OTC Plain nasal saline spray throughout the day as needed May use tylenol 325 mg 2 tablets every 6 hours as needed aches and pains or sore throat- MAX tylenol is 3000 mg in 24 hours  Mucinex DM by mouth twice daily as needed for cough and congestion with full glass of water  Keep well hydrated Avoid forcefully blowing nose Prop yourself up with some pillows to sleep Start Claritin or zyrtec 10 mg daily - OTC - okay to use generic  -follow up precautions given  Jerzie Bieri K. [place], [facility] [phone]

## 2021-02-19 ENCOUNTER — Ambulatory Visit (HOSPITAL_COMMUNITY): Payer: Medicare Other | Attending: Cardiology

## 2021-02-19 DIAGNOSIS — R011 Cardiac murmur, unspecified: Secondary | ICD-10-CM | POA: Insufficient documentation

## 2021-02-19 DIAGNOSIS — R42 Dizziness and giddiness: Secondary | ICD-10-CM | POA: Diagnosis not present

## 2021-02-19 LAB — ECHOCARDIOGRAM COMPLETE
AR max vel: 0.58 cm2
AV Area VTI: 0.72 cm2
AV Area mean vel: 0.56 cm2
AV Mean grad: 41 mmHg
AV Peak grad: 63 mmHg
Ao pk vel: 3.97 m/s
Area-P 1/2: 2.91 cm2
S' Lateral: 2.3 cm

## 2021-02-25 ENCOUNTER — Other Ambulatory Visit: Payer: Self-pay | Admitting: Nurse Practitioner

## 2021-02-25 DIAGNOSIS — I350 Nonrheumatic aortic (valve) stenosis: Secondary | ICD-10-CM

## 2021-02-26 ENCOUNTER — Other Ambulatory Visit: Payer: Self-pay | Admitting: *Deleted

## 2021-02-26 DIAGNOSIS — E034 Atrophy of thyroid (acquired): Secondary | ICD-10-CM

## 2021-02-26 MED ORDER — LEVOTHYROXINE SODIUM 88 MCG PO TABS: ORAL_TABLET | ORAL | 3 refills | Status: DC

## 2021-02-26 NOTE — Telephone Encounter (Signed)
Patient requested refill to be sent to [facility].  

## 2021-03-06 ENCOUNTER — Other Ambulatory Visit: Payer: Medicare Other

## 2021-03-06 ENCOUNTER — Other Ambulatory Visit: Payer: Self-pay

## 2021-03-06 DIAGNOSIS — E782 Mixed hyperlipidemia: Secondary | ICD-10-CM | POA: Diagnosis not present

## 2021-03-06 DIAGNOSIS — L4052 Psoriatic arthritis mutilans: Secondary | ICD-10-CM

## 2021-03-06 DIAGNOSIS — E034 Atrophy of thyroid (acquired): Secondary | ICD-10-CM

## 2021-03-07 LAB — LIPID PANEL
Cholesterol: 193 mg/dL (ref <200)
HDL: 55 mg/dL (ref >=50)
LDL Cholesterol (Calc): 114 mg/dL (calc) — ABNORMAL HIGH
Non-HDL Cholesterol (Calc): 138 mg/dL (calc) — ABNORMAL HIGH (ref <130)
Total CHOL/HDL Ratio: 3.5 (calc) (ref <5.0)
Triglycerides: 129 mg/dL (ref <150)

## 2021-03-07 LAB — CBC WITH DIFFERENTIAL/PLATELET
Absolute Monocytes: 637 cells/uL (ref 200–950)
Basophils Absolute: 13 cells/uL (ref 0–200)
Basophils Relative: 0.2 %
Eosinophils Absolute: 0 cells/uL — ABNORMAL LOW (ref 15–500)
Eosinophils Relative: 0 %
HCT: 43.3 % (ref 35.0–45.0)
Hemoglobin: 13.4 g/dL (ref 11.7–15.5)
Lymphs Abs: 2015 cells/uL (ref 850–3900)
MCH: 26 pg — ABNORMAL LOW (ref 27.0–33.0)
MCHC: 30.9 g/dL — ABNORMAL LOW (ref 32.0–36.0)
MCV: 83.9 fL (ref 80.0–100.0)
MPV: 10.3 fL (ref 7.5–12.5)
Monocytes Relative: 9.8 %
Neutro Abs: 3835 cells/uL (ref 1500–7800)
Neutrophils Relative %: 59 %
Platelets: 319 10*3/uL (ref 140–400)
RBC: 5.16 10*6/uL — ABNORMAL HIGH (ref 3.80–5.10)
RDW: 13.1 % (ref 11.0–15.0)
Total Lymphocyte: 31 %
WBC: 6.5 10*3/uL (ref 3.8–10.8)

## 2021-03-07 LAB — COMPLETE METABOLIC PANEL WITH GFR
AG Ratio: 1.2 (calc) (ref 1.0–2.5)
ALT: 14 U/L (ref 6–29)
AST: 17 U/L (ref 10–35)
Albumin: 4.1 g/dL (ref 3.6–5.1)
Alkaline phosphatase (APISO): 66 U/L (ref 37–153)
BUN/Creatinine Ratio: 12 (calc) (ref 6–22)
BUN: 15 mg/dL (ref 7–25)
CO2: 24 mmol/L (ref 20–32)
Calcium: 9.5 mg/dL (ref 8.6–10.4)
Chloride: 107 mmol/L (ref 98–110)
Creat: 1.21 mg/dL — ABNORMAL HIGH (ref 0.60–0.88)
GFR, Est African American: 49 mL/min/{1.73_m2} — ABNORMAL LOW (ref >=60)
GFR, Est Non African American: 42 mL/min/{1.73_m2} — ABNORMAL LOW (ref >=60)
Globulin: 3.3 g/dL (calc) (ref 1.9–3.7)
Glucose, Bld: 90 mg/dL (ref 65–99)
Potassium: 4.5 mmol/L (ref 3.5–5.3)
Sodium: 141 mmol/L (ref 135–146)
Total Bilirubin: 0.5 mg/dL (ref 0.2–1.2)
Total Protein: 7.4 g/dL (ref 6.1–8.1)

## 2021-03-07 LAB — TSH: TSH: 1.11 mIU/L (ref 0.40–4.50)

## 2021-03-11 ENCOUNTER — Ambulatory Visit (INDEPENDENT_AMBULATORY_CARE_PROVIDER_SITE_OTHER): Payer: Medicare Other | Admitting: Nurse Practitioner

## 2021-03-11 ENCOUNTER — Encounter: Payer: Self-pay | Admitting: Nurse Practitioner

## 2021-03-11 ENCOUNTER — Other Ambulatory Visit: Payer: Self-pay

## 2021-03-11 VITALS — BP 120/80 | HR 94 | Temp 97.3°F | Resp 16 | Ht 65.0 in | Wt 150.0 lb

## 2021-03-11 DIAGNOSIS — L4052 Psoriatic arthritis mutilans: Secondary | ICD-10-CM | POA: Diagnosis not present

## 2021-03-11 DIAGNOSIS — R42 Dizziness and giddiness: Secondary | ICD-10-CM | POA: Diagnosis not present

## 2021-03-11 DIAGNOSIS — D45 Polycythemia vera: Secondary | ICD-10-CM

## 2021-03-11 DIAGNOSIS — E782 Mixed hyperlipidemia: Secondary | ICD-10-CM | POA: Diagnosis not present

## 2021-03-11 DIAGNOSIS — E039 Hypothyroidism, unspecified: Secondary | ICD-10-CM

## 2021-03-11 DIAGNOSIS — I350 Nonrheumatic aortic (valve) stenosis: Secondary | ICD-10-CM

## 2021-03-11 NOTE — Patient Instructions (Addendum)
For dizziness can use meclizine 12.5-25 mg by mouth every 8 hours as needed for dizziness.   Let us know if you want a referral for vestibular rehab to help with dizziness.

## 2021-03-11 NOTE — Progress Notes (Signed)
Careteam: Patient Care Team: [name], NP as PCP - General (Geriatric Medicine) Tomma Rakers, MD as Referring Physician (Ophthalmology)  PLACE OF SERVICE:  [facility] Directive information Does Patient Have a Medical Advance Directive?: Yes, Type of Advance Directive: [place];Living will;Out of facility DNR (pink MOST or yellow form), Does patient want to make changes to medical advance directive?: No - Patient declined  Allergies  Allergen Reactions  . Codeine Nausea And Vomiting    Weakness and dysequilibrium  . Statins Hives    Myalgias     Chief Complaint  Patient presents with  . Medical Management of Chronic Issues    6 month follow up/ update contract.   . Immunizations    Discuss the need for Tetanus Vaccine.      HPI: Patient is a 80 y.o. female for routine follow up   Had URI last month but this resolved   Continues with dizziness. She is now having to compensate and do things slower.   Polycythemia- having Phlebotomy per hematology. Phlebotomy decreased energy level.   CKD- Cr trending up on recent labs. Drinking plenty of water.   Aortic valve calcification- follow up with Dr Einar Gip in June. She does admit to worsening shortness of breath.   Has had bad leg cramps the past few nights. - nothing helped. Got a bar of soap and put under her mattress and it helped.   She had a fall last week- was moving a table and backing up over a box. Hit her bottom. No pain no bruising.     Review of Systems:  Review of Systems  Constitutional: Negative for chills, fever and weight loss.  HENT: Negative for tinnitus.   Respiratory: Negative for cough, sputum production and shortness of breath.   Cardiovascular: Negative for chest pain, palpitations and leg swelling.  Gastrointestinal: Negative for abdominal pain, constipation, diarrhea and heartburn.  Genitourinary: Negative for dysuria, frequency and urgency.   Musculoskeletal: Positive for joint pain and myalgias. Negative for back pain and falls.  Skin: Negative.   Neurological: Positive for dizziness and headaches (occasional pain).  Psychiatric/Behavioral: Negative for depression and memory loss. The patient does not have insomnia.     Past Medical History:  Diagnosis Date  . H/O total knee replacement 2001   Right  . High cholesterol   . History of tonsillectomy and adenoidectomy 1946  . Psoriatic arthritis ([place]) 1990  . Sleep apnea with use of continuous positive airway pressure (CPAP) 2004  . Thyroid disease    Past Surgical History:  Procedure Laterality Date  . FOOT SURGERY     fused arch in left foot  . SPINE SURGERY    . TOTAL KNEE ARTHROPLASTY Left    Social History:   reports that she quit smoking about 47 years ago. Her smoking use included cigarettes. She has a 36.00 pack-year smoking history. She has never used smokeless tobacco. She reports current alcohol use of about 1.0 - 2.0 standard drink of alcohol per week. She reports that she does not use drugs.  Family History  Problem Relation Age of Onset  . Heart attack Father   . Heart disease Sister     Medications: Patient's Medications  New Prescriptions   No medications on file  Previous Medications   AMMONIUM LACTATE (LAC-HYDRIN) 12 % LOTION    Apply topically as needed.   ASPIRIN EC 81 MG TABLET    Take 81 mg by mouth  daily. Swallow whole.   CHOLECALCIFEROL (VITAMIN D) 50 MCG (2000 UT) CAPS    Take 5,000 Units by mouth daily.   ETANERCEPT (ENBREL) 50 MG/ML INJECTION    Inject 50 mg into the skin every Sunday.    EZETIMIBE (ZETIA) 10 MG TABLET    Take 1 tablet (10 mg total) by mouth daily.   HYDROCODONE-ACETAMINOPHEN (NORCO) 5-325 MG TABLET    Take 1 tablet by mouth at bedtime.   LEVOTHYROXINE (SYNTHROID) 88 MCG TABLET    Take one tablet by mouth once daily 30 minutes before breakfast on an empty stomach for thyroid.  Modified Medications   No medications on  file  Discontinued Medications   No medications on file    Physical Exam:  Vitals:   03/11/21 1301  BP: 120/80  Pulse: 94  Resp: 16  Temp: (!) 97.3 F (36.3 C)  SpO2: 98%  Weight: 150 lb (68 kg)  Height: 5\' 5" (1.651 m)   Body mass index is 24.96 kg/m. Wt Readings from Last 3 Encounters:  03/11/21 150 lb (68 kg)  02/18/21 150 lb 3.2 oz (68.1 kg)  02/14/21 150 lb 6.4 oz (68.2 kg)    Physical Exam Constitutional:      General: She is not in acute distress.    Appearance: She is well-developed. She is not diaphoretic.  HENT:     Head: Normocephalic and atraumatic.     Mouth/Throat:     Pharynx: No oropharyngeal exudate.  Eyes:     Conjunctiva/sclera: Conjunctivae normal.     Pupils: Pupils are equal, round, and reactive to light.  Cardiovascular:     Rate and Rhythm: Normal rate and regular rhythm.     Heart sounds: Murmur heard.    Pulmonary:     Effort: Pulmonary effort is normal.     Breath sounds: Normal breath sounds.  Abdominal:     General: Bowel sounds are normal.     Palpations: Abdomen is soft.  Musculoskeletal:        General: No tenderness.     Cervical back: Normal range of motion and neck supple.  Skin:    General: Skin is warm and dry.  Neurological:     Mental Status: She is alert and oriented to person, place, and time.     Labs reviewed: Basic Metabolic Panel: Recent Labs    01/21/21 1630 01/29/21 1125 03/06/21 0856  NA 141 140 141  K 4.4 4.5 4.5  CL 108 106 107  CO2 22 23 24  GLUCOSE 90 102* 90  BUN 16 17 15  CREATININE 0.98* 1.16* 1.21*  CALCIUM 9.8 9.2 9.5  TSH 0.58  --  1.11   Liver Function Tests: Recent Labs    01/21/21 1630 01/29/21 1125 03/06/21 0856  AST 19 24 17  ALT 18 28 14  ALKPHOS  --  71  --   BILITOT 0.6 0.9 0.5  PROT 8.0 8.3* 7.4  ALBUMIN  --  4.2  --    No results for input(s): LIPASE, AMYLASE in the last 8760 hours. No results for input(s): AMMONIA in the last 8760 hours. CBC: Recent Labs     04 /05/22 1125 02/14/21 0921 03/06/21 0856  WBC 6.9 5.7 6.5  NEUTROABS 5.7 3.1 3,835  HGB 14.9 15.8* 13.4  HCT 48.3* 51.2* 43.3  MCV 84.6 84.6 83.9  PLT 200 254 319   Lipid Panel: Recent Labs    08/20/20 0952 03/06/21 0856  CHOL 232* 193  HDL 47* 55  LDLCALC 153* 114*  TRIG 186* 129  CHOLHDL 4.9 3.5   TSH: Recent Labs    01/21/21 1630 03/06/21 0856  TSH 0.58 1.11   A1C: No results found for: HGBA1C   Assessment/Plan 1. Nonrheumatic aortic valve stenosis -noted on echo, plans to follow up with cardiologist.   2. Dizziness -ongoing, did not use meclizine but plans to try. Will contact office if she wants vestibular rehab.   3. 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 Acquired hypothyroidism TSH at goal on synthroid 88 mcg daily   5. Polycythemia vera  -followed by hematology getting Phlebotomy PRN  6. Mixed hyperlipidemia -LDL has improved on zetia, she is tolerating well.   Next appt: 6 months.  Carlos American. [place], [facility] [phone]

## 2021-03-14 ENCOUNTER — Inpatient Hospital Stay: Payer: Medicare Other | Attending: Internal Medicine | Admitting: Internal Medicine

## 2021-03-14 ENCOUNTER — Inpatient Hospital Stay: Payer: Medicare Other

## 2021-03-14 ENCOUNTER — Other Ambulatory Visit: Payer: Self-pay

## 2021-03-14 ENCOUNTER — Encounter: Payer: Self-pay | Admitting: Internal Medicine

## 2021-03-14 ENCOUNTER — Other Ambulatory Visit: Payer: Self-pay | Admitting: *Deleted

## 2021-03-14 VITALS — BP 123/77 | HR 86 | Temp 98.4°F | Resp 19 | Ht 65.0 in | Wt 150.4 lb

## 2021-03-14 DIAGNOSIS — Z79899 Other long term (current) drug therapy: Secondary | ICD-10-CM | POA: Diagnosis not present

## 2021-03-14 DIAGNOSIS — Z888 Allergy status to other drugs, medicaments and biological substances status: Secondary | ICD-10-CM | POA: Insufficient documentation

## 2021-03-14 DIAGNOSIS — L4052 Psoriatic arthritis mutilans: Secondary | ICD-10-CM

## 2021-03-14 DIAGNOSIS — Z885 Allergy status to narcotic agent status: Secondary | ICD-10-CM | POA: Diagnosis not present

## 2021-03-14 DIAGNOSIS — R5383 Other fatigue: Secondary | ICD-10-CM | POA: Insufficient documentation

## 2021-03-14 DIAGNOSIS — G894 Chronic pain syndrome: Secondary | ICD-10-CM

## 2021-03-14 DIAGNOSIS — D45 Polycythemia vera: Secondary | ICD-10-CM

## 2021-03-14 LAB — CMP (CANCER CENTER ONLY)
ALT: 13 U/L (ref 0–44)
AST: 21 U/L (ref 15–41)
Albumin: 3.9 g/dL (ref 3.5–5.0)
Alkaline Phosphatase: 68 U/L (ref 38–126)
Anion gap: 8 (ref 5–15)
BUN: 17 mg/dL (ref 8–23)
CO2: 24 mmol/L (ref 22–32)
Calcium: 9.5 mg/dL (ref 8.9–10.3)
Chloride: 108 mmol/L (ref 98–111)
Creatinine: 1.05 mg/dL — ABNORMAL HIGH (ref 0.44–1.00)
GFR, Estimated: 53 mL/min — ABNORMAL LOW (ref >60)
Glucose, Bld: 95 mg/dL (ref 70–99)
Potassium: 4.3 mmol/L (ref 3.5–5.1)
Sodium: 140 mmol/L (ref 135–145)
Total Bilirubin: 0.5 mg/dL (ref 0.3–1.2)
Total Protein: 7.9 g/dL (ref 6.5–8.1)

## 2021-03-14 LAB — CBC WITH DIFFERENTIAL (CANCER CENTER ONLY)
Abs Immature Granulocytes: 0.01 10*3/uL (ref 0.00–0.07)
Basophils Absolute: 0 10*3/uL (ref 0.0–0.1)
Basophils Relative: 0 %
Eosinophils Absolute: 0 10*3/uL (ref 0.0–0.5)
Eosinophils Relative: 0 %
HCT: 42.8 % (ref 36.0–46.0)
Hemoglobin: 13.8 g/dL (ref 12.0–15.0)
Immature Granulocytes: 0 %
Lymphocytes Relative: 30 %
Lymphs Abs: 1.9 10*3/uL (ref 0.7–4.0)
MCH: 26.7 pg (ref 26.0–34.0)
MCHC: 32.2 g/dL (ref 30.0–36.0)
MCV: 82.9 fL (ref 80.0–100.0)
Monocytes Absolute: 0.7 10*3/uL (ref 0.1–1.0)
Monocytes Relative: 11 %
Neutro Abs: 3.5 10*3/uL (ref 1.7–7.7)
Neutrophils Relative %: 59 %
Platelet Count: 252 10*3/uL (ref 150–400)
RBC: 5.16 MIL/uL — ABNORMAL HIGH (ref 3.87–5.11)
RDW: 14.1 % (ref 11.5–15.5)
WBC Count: 6.1 10*3/uL (ref 4.0–10.5)
nRBC: 0 % (ref 0.0–0.2)

## 2021-03-14 MED ORDER — HYDROCODONE-ACETAMINOPHEN 5-325 MG PO TABS: 1.0000 | ORAL_TABLET | Freq: Every day | ORAL | 0 refills | Status: DC

## 2021-03-14 NOTE — Progress Notes (Signed)
[facility] [phone]   [phone]  OFFICE PROGRESS NOTE  [name], NP [place] [address]  DIAGNOSIS: Polycythemia vera with positive JAK2 mutation diagnosed in April 2022  PRIOR THERAPY: None  CURRENT THERAPY: Phlebotomy on as-needed basis  INTERVAL HISTORY: Sydney Reid 80 y.o. female returns to the clinic today for follow-up visit.  The patient is feeling fine today with no concerning complaints except for mild fatigue.  She underwent phlebotomy a month ago and she did not notice any difference in her condition.  She denied having any current chest pain, shortness of breath, cough or hemoptysis.  She denied having any fever or chills.  She has no nausea, vomiting, diarrhea or constipation.  She has no headache or visual changes.  She is here today for evaluation and repeat blood work.  MEDICAL HISTORY: Past Medical History:  Diagnosis Date  . H/O total knee replacement 2001   Right  . High cholesterol   . History of tonsillectomy and adenoidectomy 1946  . Psoriatic arthritis ([place]) 1990  . Sleep apnea with use of continuous positive airway pressure (CPAP) 2004  . Thyroid disease     ALLERGIES:  is allergic to codeine and statins.  MEDICATIONS:  Current Outpatient Medications  Medication Sig Dispense Refill  . ammonium lactate (LAC-HYDRIN) 12 % lotion Apply topically as needed.    Marland Kitchen aspirin EC 81 MG tablet Take 81 mg by mouth daily. Swallow whole.    . Cholecalciferol (VITAMIN D) 50 MCG (2000 UT) CAPS Take 5,000 Units by mouth daily.    Marland Kitchen etanercept (ENBREL) 50 MG/ML injection Inject 50 mg into the skin every Sunday.     . ezetimibe (ZETIA) 10 MG tablet Take 1 tablet (10 mg total) by mouth daily. 90 tablet 1  . HYDROcodone-acetaminophen (NORCO) 5-325 MG tablet Take 1 tablet by mouth at bedtime. 30 tablet 0  . levothyroxine (SYNTHROID) 88 MCG tablet Take one tablet by mouth once daily 30 minutes  before breakfast on an empty stomach for thyroid. 90 tablet 3   No current facility-administered medications for this visit.    SURGICAL HISTORY:  Past Surgical History:  Procedure Laterality Date  . FOOT SURGERY     fused arch in left foot  . SPINE SURGERY    . TOTAL KNEE ARTHROPLASTY Left     REVIEW OF SYSTEMS:  A comprehensive review of systems was negative.   PHYSICAL EXAMINATION: General appearance: alert, cooperative and no distress Head: Normocephalic, without obvious abnormality, atraumatic Neck: no adenopathy, no JVD, supple, symmetrical, trachea midline and thyroid not enlarged, symmetric, no tenderness/mass/nodules Lymph nodes: Cervical, supraclavicular, and axillary nodes normal. Resp: clear to auscultation bilaterally Back: symmetric, no curvature. ROM normal. No CVA tenderness. Cardio: regular rate and rhythm, S1, S2 normal, no murmur, click, rub or gallop GI: soft, non-tender; bowel sounds normal; no masses,  no organomegaly Extremities: extremities normal, atraumatic, no cyanosis or edema  ECOG PERFORMANCE STATUS: 1 - Symptomatic but completely ambulatory  Blood pressure 123/77, pulse 86, temperature 98.4 F (36.9 C), temperature source Tympanic, resp. rate 19, height 5\' 5"  (1.651 m), weight 150 lb 6.4 oz (68.2 kg), SpO2 100 %.  LABORATORY DATA: Lab Results  Component Value Date   WBC 6.1 03/14/2021   HGB 13.8 03/14/2021   HCT 42.8 03/14/2021   MCV 82.9 03/14/2021   PLT 252 03/14/2021      Chemistry      Component Value  Date/Time   NA 141 03/06/2021 0856   NA 138 04/02/2016 0000   K 4.5 03/06/2021 0856   CL 107 03/06/2021 0856   CO2 24 03/06/2021 0856   BUN 15 03/06/2021 0856   BUN 17 04/02/2016 0000   CREATININE 1.21 (H) 03/06/2021 0856   GLU 93 04/02/2016 0000      Component Value Date/Time   CALCIUM 9.5 03/06/2021 0856   ALKPHOS 71 01/29/2021 1125   AST 17 03/06/2021 0856   AST 24 01/29/2021 1125   ALT 14 03/06/2021 0856   ALT 28  01/29/2021 1125   BILITOT 0.5 03/06/2021 0856   BILITOT 0.9 01/29/2021 1125       RADIOGRAPHIC STUDIES: ECHOCARDIOGRAM COMPLETE  Result Date: 02/19/2021    ECHOCARDIOGRAM REPORT   Patient Name:   SAVREEN GEBHARDT Date of Exam: 02/19/2021 Medical Rec #:  [id number]          Height:       65.0 in Accession #:    [id number]         Weight:       150.2 lb Date of Birth:  1941/06/11           BSA:          1.751 m Patient Age:    86 years           BP:           128/80 mmHg Patient Gender: F                  HR:           88 bpm. Exam Location:  [address] Procedure: 2D Echo, Cardiac Doppler and Color Doppler Indications:    R01.1 Murmur  History:        Patient has prior history of Echocardiogram examinations, most                 recent 05/20/2016. Risk Factors:Dyslipidemia. Sleep apnea.                 Thyroid disease.  Sonographer:    Cresenciano Lick RDCS Referring Phys: [place]  1. Left ventricular ejection fraction, by estimation, is 60 to 65%. The left ventricle has normal function. The left ventricle has no regional wall motion abnormalities. There is mild left ventricular hypertrophy of the basal-septal segment. Left ventricular diastolic parameters are consistent with Grade I diastolic dysfunction (impaired relaxation).  2. Right ventricular systolic function is normal. The right ventricular size is normal. There is normal pulmonary artery systolic pressure. The estimated right ventricular systolic pressure is 95.1 mmHg.  3. The mitral valve is normal in structure. No evidence of mitral valve regurgitation. No evidence of mitral stenosis.  4. The aortic valve is calcified. There is severe calcifcation of the aortic valve. There is severe thickening of the aortic valve. Aortic valve regurgitation is not visualized. Severe aortic valve stenosis. Aortic valve area, by VTI measures 0.72 cm. Aortic valve mean gradient measures 41.0 mmHg. Aortic valve Vmax measures 3.97  m/s.  5. The inferior vena cava is normal in size with greater than 50% respiratory variability, suggesting right atrial pressure of 3 mmHg. FINDINGS  Left Ventricle: Left ventricular ejection fraction, by estimation, is 60 to 65%. The left ventricle has normal function. The left ventricle has no regional wall motion abnormalities. The left ventricular internal cavity size was normal in size. There is  mild left ventricular hypertrophy of the basal-septal segment.  Left ventricular diastolic parameters are consistent with Grade I diastolic dysfunction (impaired relaxation). Normal left ventricular filling pressure. Right Ventricle: The right ventricular size is normal. No increase in right ventricular wall thickness. Right ventricular systolic function is normal. There is normal pulmonary artery systolic pressure. The tricuspid regurgitant velocity is 2.52 m/s, and  with an assumed right atrial pressure of 3 mmHg, the estimated right ventricular systolic pressure is 06.3 mmHg. Left Atrium: Left atrial size was normal in size. Right Atrium: Right atrial size was normal in size. Pericardium: There is no evidence of pericardial effusion. Mitral Valve: The mitral valve is normal in structure. No evidence of mitral valve regurgitation. No evidence of mitral valve stenosis. Tricuspid Valve: The tricuspid valve is normal in structure. Tricuspid valve regurgitation is not demonstrated. No evidence of tricuspid stenosis. Aortic Valve: The aortic valve is calcified. There is severe calcifcation of the aortic valve. There is severe thickening of the aortic valve. There is severe aortic valve annular calcification. Aortic valve regurgitation is not visualized. Severe aortic  stenosis is present. Aortic valve mean gradient measures 41.0 mmHg. Aortic valve peak gradient measures 63.0 mmHg. Aortic valve area, by VTI measures 0.72 cm. Pulmonic Valve: The pulmonic valve was normal in structure. Pulmonic valve regurgitation is not  visualized. No evidence of pulmonic stenosis. Aorta: The aortic root is normal in size and structure. Venous: The inferior vena cava is normal in size with greater than 50% respiratory variability, suggesting right atrial pressure of 3 mmHg. IAS/Shunts: The interatrial septum appears to be lipomatous. No atrial level shunt detected by color flow Doppler.  LEFT VENTRICLE PLAX 2D LVIDd:         3.40 cm  Diastology LVIDs:         2.30 cm  LV e' medial:    6.42 cm/s LV PW:         1.00 cm  LV E/e' medial:  8.6 LV IVS:        1.00 cm  LV e' lateral:   8.16 cm/s LVOT diam:     1.90 cm  LV E/e' lateral: 6.8 LV SV:         52 LV SV Index:   30 LVOT Area:     2.84 cm  RIGHT VENTRICLE             IVC RV Basal diam:  3.20 cm     IVC diam: 1.30 cm RV S prime:     12.95 cm/s TAPSE (M-mode): 1.7 cm LEFT ATRIUM             Index       RIGHT ATRIUM           Index LA diam:        4.10 cm 2.34 cm/m  RA Area:     11.30 cm LA Vol (A2C):   23.7 ml 13.53 ml/m RA Volume:   27.00 ml  15.42 ml/m LA Vol (A4C):   19.6 ml 11.19 ml/m LA Biplane Vol: 23.3 ml 13.30 ml/m  AORTIC VALVE AV Area (Vmax):    0.58 cm AV Area (Vmean):   0.56 cm AV Area (VTI):     0.72 cm AV Vmax:           397.00 cm/s AV Vmean:          311.000 cm/s AV VTI:            0.719 m AV Peak Grad:      63.0 mmHg AV  Mean Grad:      41.0 mmHg LVOT Vmax:         81.80 cm/s LVOT Vmean:        61.550 cm/s LVOT VTI:          0.183 m LVOT/AV VTI ratio: 0.25  AORTA Ao Root diam: 3.00 cm MITRAL VALVE               TRICUSPID VALVE MV Area (PHT): 2.91 cm    TR Peak grad:   25.4 mmHg MV Decel Time: 261 msec    TR Vmax:        252.00 cm/s MV E velocity: 55.30 cm/s MV A velocity: 86.80 cm/s  SHUNTS MV E/A ratio:  0.64        Systemic VTI:  0.18 m                            Systemic Diam: 1.90 cm Fransico Him MD Electronically signed by Fransico Him MD Signature Date/Time: 02/19/2021/3:56:02 PM    Final     ASSESSMENT AND PLAN: This is a very pleasant 80 years old white female  recently diagnosed with polycythemia vera with positive JAK2 mutation. I discussed the lab results with the patient today. I recommended for her to proceed with phlebotomy to keep her hematocrit less than 45%. CBC today showed hematocrit of 42.8%. I did not recommend phlebotomy for the patient today. I will see her back for follow-up visit in 2 months for evaluation and repeat blood work and phlebotomy if needed. She will continue on aspirin 81 mg p.o. daily. The patient was advised to call immediately if she has any concerning symptoms in the interval.  The patient voices understanding of current disease status and treatment options and is in agreement with the current care plan.  All questions were answered. The patient knows to call the clinic with any problems, questions or concerns. We can certainly see the patient much sooner if necessary.   Disclaimer: This note was dictated with voice recognition software. Similar sounding words can inadvertently be transcribed and may not be corrected upon review.

## 2021-03-14 NOTE — Telephone Encounter (Signed)
Patient requested refill Epic LR: 02/11/2021 Contract on [facility] and sent to [place] for approval.

## 2021-03-26 DIAGNOSIS — M21961 Unspecified acquired deformity of right lower leg: Secondary | ICD-10-CM | POA: Diagnosis not present

## 2021-03-26 DIAGNOSIS — L84 Corns and callosities: Secondary | ICD-10-CM | POA: Diagnosis not present

## 2021-03-26 DIAGNOSIS — I70213 Atherosclerosis of native arteries of extremities with intermittent claudication, bilateral legs: Secondary | ICD-10-CM | POA: Diagnosis not present

## 2021-03-26 DIAGNOSIS — L603 Nail dystrophy: Secondary | ICD-10-CM | POA: Diagnosis not present

## 2021-03-28 ENCOUNTER — Encounter: Payer: Self-pay | Admitting: Internal Medicine

## 2021-03-29 ENCOUNTER — Other Ambulatory Visit: Payer: Self-pay

## 2021-03-29 ENCOUNTER — Ambulatory Visit: Payer: Medicare Other | Admitting: Cardiology

## 2021-03-29 ENCOUNTER — Encounter: Payer: Self-pay | Admitting: Cardiology

## 2021-03-29 VITALS — BP 131/80 | HR 71 | Temp 96.4°F | Resp 17 | Ht 65.0 in | Wt 150.4 lb

## 2021-03-29 DIAGNOSIS — R0600 Dyspnea, unspecified: Secondary | ICD-10-CM

## 2021-03-29 DIAGNOSIS — L4052 Psoriatic arthritis mutilans: Secondary | ICD-10-CM | POA: Diagnosis not present

## 2021-03-29 DIAGNOSIS — I350 Nonrheumatic aortic (valve) stenosis: Secondary | ICD-10-CM | POA: Diagnosis not present

## 2021-03-29 DIAGNOSIS — R0989 Other specified symptoms and signs involving the circulatory and respiratory systems: Secondary | ICD-10-CM

## 2021-03-29 DIAGNOSIS — R2681 Unsteadiness on feet: Secondary | ICD-10-CM | POA: Diagnosis not present

## 2021-03-29 DIAGNOSIS — R0609 Other forms of dyspnea: Secondary | ICD-10-CM | POA: Diagnosis not present

## 2021-03-29 DIAGNOSIS — N1831 Chronic kidney disease, stage 3a: Secondary | ICD-10-CM | POA: Diagnosis not present

## 2021-03-29 DIAGNOSIS — E782 Mixed hyperlipidemia: Secondary | ICD-10-CM | POA: Diagnosis not present

## 2021-03-29 MED ORDER — SIMVASTATIN 10 MG PO TABS: 10.0000 mg | ORAL_TABLET | Freq: Every day | ORAL | 2 refills | Status: DC

## 2021-03-29 NOTE — Progress Notes (Signed)
Primary Physician/Referring:  [name], NP  Patient ID: Sydney Reid, female    DOB: 1941/07/08, 80 y.o.   MRN: [id number]  Chief Complaint  Patient presents with  . New Patient (Initial Visit)  . nonrheumatic aortic valve stneosis    Ref by Sherrie Mustache   HPI:    Sydney Reid  is a 80 y.o. Caucasian female patient with aortic stenosis, stage IIIa chronic kidney disease, hyperlipidemia, obstructive sleep apnea on CPAP, last evaluated by cardiology in 2017, recently underwent repeat echocardiogram on 02/19/2021 revealing severe aortic stenosis.  She is now referred to me for evaluation of valvular heart disease.  She is accompanied by her husband today, states that she has noticed very mild gradual worsening dyspnea and also when she lays down she feels heaviness in her chest.  But she has continued to do all her activities of daily living without reducing her activity.  Symptoms are new over the past several months.  Her other complaint is gait instability and she feels loss of coordination and runs into objects, no history of injury or fall.  Denies dizziness or syncope.  Happens sporadically without reasons.  Past Medical History:  Diagnosis Date  . H/O total knee replacement 2001   Right  . High cholesterol   . History of tonsillectomy and adenoidectomy 1946  . Psoriatic arthritis ([place]) 1990  . Sleep apnea with use of continuous positive airway pressure (CPAP) 2004  . Thyroid disease    Past Surgical History:  Procedure Laterality Date  . FOOT SURGERY     fused arch in left foot  . SPINE SURGERY    . TOTAL KNEE ARTHROPLASTY Left    Family History  Problem Relation Age of Onset  . Heart attack Father   . Heart disease Sister     Social History   Tobacco Use  . Smoking status: Former Smoker    Packs/day: 2.00    Years: 18.00    Pack years: 36.00    Types: Cigarettes    Quit date: 1975    Years since quitting: 47.4  . Smokeless tobacco: Never  Used  Substance Use Topics  . Alcohol use: Yes    Alcohol/week: 1.0 - 2.0 standard drink    Types: 1 - 2 Standard drinks or equivalent per week    Comment: Social drinker, couple times a week   Marital Status: Married  ROS  Review of Systems  Cardiovascular: Negative for chest pain, dyspnea on exertion and leg swelling.  Musculoskeletal: Positive for arthritis and back pain.  Gastrointestinal: Negative for melena.  Neurological: Positive for disturbances in coordination.   Objective  Blood pressure 131/80, pulse 71, temperature (!) 96.4 F (35.8 C), temperature source Temporal, resp. rate 17, height 5\' 5"  (1.651 m), weight 150 lb 6.4 oz (68.2 kg), SpO2 98 %. Body mass index is 25.03 kg/m.  Vitals with BMI 03/29/2021 03/14/2021 03/11/2021  Height 5\' 5"  5\' 5"  5\' 5"   Weight 150 lbs 6 oz 150 lbs 6 oz 150 lbs  BMI 25.03 78.93 81.01  Systolic 751 025 852  Diastolic 80 77 80  Pulse 71 86 94     Physical Exam Constitutional:      Comments: Short stature  Neck:     Vascular: No carotid bruit or JVD.  Cardiovascular:     Rate and Rhythm: Normal rate and regular rhythm.     Pulses: Intact distal pulses.     Heart sounds: Murmur heard.  Harsh crescendo midsystolic murmur is present with a grade of 3/6 at the upper right sternal border radiating to the neck. No gallop.   Pulmonary:     Effort: Pulmonary effort is normal.     Breath sounds: Normal breath sounds.  Abdominal:     General: Bowel sounds are normal.     Palpations: Abdomen is soft.  Musculoskeletal:        General: No swelling.      Laboratory examination:   Recent Labs    08/20/20 0952 01/21/21 1630 01/29/21 1125 03/06/21 0856 03/14/21 1141  NA 139 141 140 141 140  K 4.4 4.4 4.5 4.5 4.3  CL 107 108 106 107 108  CO2 25 22 23 24 24   GLUCOSE 91 90 102* 90 95  BUN 18 16 17 15 17   CREATININE 1.12* 0.98* 1.16* 1.21* 1.05*  CALCIUM 9.4 9.8 9.2 9.5 9.5  GFRNONAA 46* 54* 48* 42* 53*  GFRAA 54* 63  --  49*  --     estimated creatinine clearance is 37.8 mL/min (A) (by C-G formula based on SCr of 1.05 mg/dL (H)).  CMP Latest Ref Rng & Units 03/14/2021 03/06/2021 01/29/2021  Glucose 70 - 99 mg/dL 95 90 102(H)  BUN 8 - 23 mg/dL 17 15 17   Creatinine 0.44 - 1.00 mg/dL 1.05(H) 1.21(H) 1.16(H)  Sodium 135 - 145 mmol/L 140 141 140  Potassium 3.5 - 5.1 mmol/L 4.3 4.5 4.5  Chloride 98 - 111 mmol/L 108 107 106  CO2 22 - 32 mmol/L 24 24 23   Calcium 8.9 - 10.3 mg/dL 9.5 9.5 9.2  Total Protein 6.5 - 8.1 g/dL 7.9 7.4 8.3(H)  Total Bilirubin 0.3 - 1.2 mg/dL 0.5 0.5 0.9  Alkaline Phos 38 - 126 U/L 68 - 71  AST 15 - 41 U/L 21 17 24   ALT 0 - 44 U/L 13 14 28    CBC Latest Ref Rng & Units 03/14/2021 03/06/2021 02/14/2021  WBC 4.0 - 10.5 K/uL 6.1 6.5 5.7  Hemoglobin 12.0 - 15.0 g/dL 13.8 13.4 15.8(H)  Hematocrit 36.0 - 46.0 % 42.8 43.3 51.2(H)  Platelets 150 - 400 K/uL 252 319 254    Lipid Panel Recent Labs    08/20/20 0952 03/06/21 0856  CHOL 232* 193  TRIG 186* 129  LDLCALC 153* 114*  HDL 47* 55  CHOLHDL 4.9 3.5   Lipid Panel     Component Value Date/Time   CHOL 193 03/06/2021 0856   TRIG 129 03/06/2021 0856   HDL 55 03/06/2021 0856   CHOLHDL 3.5 03/06/2021 0856   VLDL 23 04/09/2017 0844   LDLCALC 114 (H) 03/06/2021 0856     HEMOGLOBIN A1C No results found for: HGBA1C, MPG TSH Recent Labs    01/21/21 1630 03/06/21 0856  TSH 0.58 1.11   Medications and allergies   Allergies  Allergen Reactions  . Codeine Nausea And Vomiting    Weakness and dysequilibrium  . Statins Hives    Myalgias     Medication prior to this encounter:   Outpatient Medications Prior to Visit  Medication Sig Dispense Refill  . ammonium lactate (LAC-HYDRIN) 12 % lotion Apply topically as needed.    Marland Kitchen aspirin EC 81 MG tablet Take 81 mg by mouth daily. Swallow whole.    . Cholecalciferol (VITAMIN D) 50 MCG (2000 UT) CAPS Take 5,000 Units by mouth daily.    Marland Kitchen etanercept (ENBREL) 50 MG/ML injection Inject 50 mg into  the skin every Sunday.     Marland Kitchen  ezetimibe (ZETIA) 10 MG tablet Take 1 tablet (10 mg total) by mouth daily. 90 tablet 1  . HYDROcodone-acetaminophen (NORCO) 5-325 MG tablet Take 1 tablet by mouth at bedtime. 30 tablet 0  . levothyroxine (SYNTHROID) 88 MCG tablet Take one tablet by mouth once daily 30 minutes before breakfast on an empty stomach for thyroid. 90 tablet 3   No facility-administered medications prior to visit.    FINAL MEDICATION AS OF TODAY:   Current Meds  Medication Sig  . ammonium lactate (LAC-HYDRIN) 12 % lotion Apply topically as needed.  Marland Kitchen aspirin EC 81 MG tablet Take 81 mg by mouth daily. Swallow whole.  . Cholecalciferol (VITAMIN D) 50 MCG (2000 UT) CAPS Take 5,000 Units by mouth daily.  Marland Kitchen etanercept (ENBREL) 50 MG/ML injection Inject 50 mg into the skin every Sunday.   . ezetimibe (ZETIA) 10 MG tablet Take 1 tablet (10 mg total) by mouth daily.  Marland Kitchen HYDROcodone-acetaminophen (NORCO) 5-325 MG tablet Take 1 tablet by mouth at bedtime.  Marland Kitchen levothyroxine (SYNTHROID) 88 MCG tablet Take one tablet by mouth once daily 30 minutes before breakfast on an empty stomach for thyroid.  . simvastatin (ZOCOR) 10 MG tablet Take 1 tablet (10 mg total) by mouth at bedtime.   Medications after current encounter Current Outpatient Medications  Medication Instructions  . ammonium lactate (LAC-HYDRIN) 12 % lotion Topical, As needed  . aspirin EC 81 mg, Oral, Daily, Swallow whole.  . etanercept (ENBREL) 50 mg, Subcutaneous, Every Sun  . ezetimibe (ZETIA) 10 mg, Oral, Daily  . HYDROcodone-acetaminophen (NORCO) 5-325 MG tablet 1 tablet, Oral, Daily at bedtime  . levothyroxine (SYNTHROID) 88 MCG tablet Take one tablet by mouth once daily 30 minutes before breakfast on an empty stomach for thyroid.  . simvastatin (ZOCOR) 10 mg, Oral, Daily at bedtime  . Vitamin D 5,000 Units, Oral, Daily    Radiology:   No results found.  Cardiac Studies:   Echocardiogram 02/19/2021:   1. Left ventricular  ejection fraction, by estimation, is 60 to 65%. The left ventricle has normal function. The left ventricle has no regional wall motion abnormalities. There is mild left ventricular hypertrophy of the basal-septal segment. Left ventricular diastolic parameters are consistent with Grade I diastolic dysfunction (impaired relaxation).  2. Right ventricular systolic function is normal. The right ventricular size is normal. There is normal pulmonary artery systolic pressure. The estimated right ventricular systolic pressure is 96.7 mmHg.  3. The mitral valve is normal in structure. No evidence of mitral valve regurgitation. No evidence of mitral stenosis.  4. The aortic valve is calcified. There is severe calcifcation of the aortic valve. There is severe thickening of the aortic valve. Aortic valve regurgitation is not visualized. Severe aortic valve stenosis. Aortic valve area, by VTI measures 0.72 cm.  Aortic valve mean gradient measures 41.0 mmHg. Aortic valve Vmax measures 3.97 m/s.  5. The inferior vena cava is normal in size with greater than 50% respiratory variability, suggesting right atrial pressure of 3 mmHg.  EKG:    EKG 03/29/2021: Normal sinus rhythm at rate of 72 bpm, normal axis, incomplete right bundle branch block.  Otherwise normal EKG.      Assessment     ICD-10-CM   1. Severe aortic stenosis  I35.0 EKG 12-Lead    CBC    Basic metabolic panel  2. Dyspnea on exertion  R06.00   3. Bruit of right carotid artery  R09.89 PCV CAROTID DUPLEX (BILATERAL)  4. Mixed hyperlipidemia  E78.2 simvastatin (  ZOCOR) 10 MG tablet  5. Stage 3a chronic kidney disease (HCC)  N18.31   6. 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 Gait instability  R26.81 PCV CAROTID DUPLEX (BILATERAL)    Ambulatory referral to ENT     There are no discontinued medications.  Meds ordered this encounter  Medications  . simvastatin (ZOCOR) 10 MG tablet    Sig: Take 1 tablet (10 mg total) by mouth at  bedtime.    Dispense:  30 tablet    Refill:  2   Orders Placed This Encounter  Procedures  . CBC  . Basic metabolic panel  . Ambulatory referral to ENT    Referral Priority:   Routine    Referral Type:   Consultation    Referral Reason:   Specialty Services Required    Referred to Provider:   Leta Baptist, MD    Requested Specialty:   Otolaryngology    Number of Visits Requested:   1  . EKG 12-Lead   Recommendations:   Sydney Reid is a 80 y.o. Caucasian female patient with aortic stenosis, stage IIIa chronic kidney disease, hyperlipidemia, obstructive sleep apnea on CPAP, last evaluated by cardiology in 2017, recently underwent repeat echocardiogram on 02/19/2021 revealing severe aortic stenosis.  She is now referred to me for evaluation of valvular heart disease.  Over the past few months she has noticed very mild exertional dyspnea but attributed this to her age.  She does feel dyspneic when she climbs a flight of stairs and also states that at night when she lays down her chest feels heavy.  She has not limited her activities due to her symptoms.  There is no clinical evidence of heart failure.  Her physical examination includes aortic stenotic murmur and a very faint right carotid bruit no and faint bilateral pedal artery pulses, and destructive psoriatic arthritis involving her hands and feet.  Schedule for carotid duplex for bruit/follow-up surveillance of carotid stenosis.  Schedule for right and left heart catheterization.  Extensive discussion regarding risks and benefits of cardiac catheterization discussed with her and her husband at the bedside.  With regard to hyperlipidemia, previous stage she has not been able to tolerate statins due to hives, I have recommended that she try simvastatin 10 mg daily in the evening and continue Zetia.  She has gait instability and she is off balance and runs into objects, no history of fall or injury.  Would like her to be evaluated by  ENT, referral to Dr. Benjamine Mola made.  I will see her back after the test.  This was a complex consultation of >60 minutes with evaluation of external records, discussions regarding cardiac catheterization and coordination of care.   Adrian Prows, MD, [facility] 03/29/2021, 1:05 PM Office: [phone]

## 2021-03-29 NOTE — H&P (View-Only) (Signed)
Primary Physician/Referring:  [name], NP  Patient ID: Sydney Reid, female    DOB: 1940/11/09, 80 y.o.   MRN: [id number]  Chief Complaint  Patient presents with  . New Patient (Initial Visit)  . nonrheumatic aortic valve stneosis    Ref by Sherrie Mustache   HPI:    Sydney Reid  is a 80 y.o. Caucasian female patient with aortic stenosis, stage IIIa chronic kidney disease, hyperlipidemia, obstructive sleep apnea on CPAP, last evaluated by cardiology in 2017, recently underwent repeat echocardiogram on 02/19/2021 revealing severe aortic stenosis.  She is now referred to me for evaluation of valvular heart disease.  She is accompanied by her husband today, states that she has noticed very mild gradual worsening dyspnea and also when she lays down she feels heaviness in her chest.  But she has continued to do all her activities of daily living without reducing her activity.  Symptoms are new over the past several months.  Her other complaint is gait instability and she feels loss of coordination and runs into objects, no history of injury or fall.  Denies dizziness or syncope.  Happens sporadically without reasons.  Past Medical History:  Diagnosis Date  . H/O total knee replacement 2001   Right  . High cholesterol   . History of tonsillectomy and adenoidectomy 1946  . Psoriatic arthritis ([place]) 1990  . Sleep apnea with use of continuous positive airway pressure (CPAP) 2004  . Thyroid disease    Past Surgical History:  Procedure Laterality Date  . FOOT SURGERY     fused arch in left foot  . SPINE SURGERY    . TOTAL KNEE ARTHROPLASTY Left    Family History  Problem Relation Age of Onset  . Heart attack Father   . Heart disease Sister     Social History   Tobacco Use  . Smoking status: Former Smoker    Packs/day: 2.00    Years: 18.00    Pack years: 36.00    Types: Cigarettes    Quit date: 1975    Years since quitting: 47.4  . Smokeless tobacco: Never  Used  Substance Use Topics  . Alcohol use: Yes    Alcohol/week: 1.0 - 2.0 standard drink    Types: 1 - 2 Standard drinks or equivalent per week    Comment: Social drinker, couple times a week   Marital Status: Married  ROS  Review of Systems  Cardiovascular: Negative for chest pain, dyspnea on exertion and leg swelling.  Musculoskeletal: Positive for arthritis and back pain.  Gastrointestinal: Negative for melena.  Neurological: Positive for disturbances in coordination.   Objective  Blood pressure 131/80, pulse 71, temperature (!) 96.4 F (35.8 C), temperature source Temporal, resp. rate 17, height 5\' 5"  (1.651 m), weight 150 lb 6.4 oz (68.2 kg), SpO2 98 %. Body mass index is 25.03 kg/m.  Vitals with BMI 03/29/2021 03/14/2021 03/11/2021  Height 5\' 5"  5\' 5"  5\' 5"   Weight 150 lbs 6 oz 150 lbs 6 oz 150 lbs  BMI 25.03 37.10 62.69  Systolic 485 462 703  Diastolic 80 77 80  Pulse 71 86 94     Physical Exam Constitutional:      Comments: Short stature  Neck:     Vascular: No carotid bruit or JVD.  Cardiovascular:     Rate and Rhythm: Normal rate and regular rhythm.     Pulses: Intact distal pulses.     Heart sounds: Murmur heard.  Harsh crescendo midsystolic murmur is present with a grade of 3/6 at the upper right sternal border radiating to the neck. No gallop.   Pulmonary:     Effort: Pulmonary effort is normal.     Breath sounds: Normal breath sounds.  Abdominal:     General: Bowel sounds are normal.     Palpations: Abdomen is soft.  Musculoskeletal:        General: No swelling.      Laboratory examination:   Recent Labs    08/20/20 0952 01/21/21 1630 01/29/21 1125 03/06/21 0856 03/14/21 1141  NA 139 141 140 141 140  K 4.4 4.4 4.5 4.5 4.3  CL 107 108 106 107 108  CO2 25 22 23 24 24   GLUCOSE 91 90 102* 90 95  BUN 18 16 17 15 17   CREATININE 1.12* 0.98* 1.16* 1.21* 1.05*  CALCIUM 9.4 9.8 9.2 9.5 9.5  GFRNONAA 46* 54* 48* 42* 53*  GFRAA 54* 63  --  49*  --     estimated creatinine clearance is 37.8 mL/min (A) (by C-G formula based on SCr of 1.05 mg/dL (H)).  CMP Latest Ref Rng & Units 03/14/2021 03/06/2021 01/29/2021  Glucose 70 - 99 mg/dL 95 90 102(H)  BUN 8 - 23 mg/dL 17 15 17   Creatinine 0.44 - 1.00 mg/dL 1.05(H) 1.21(H) 1.16(H)  Sodium 135 - 145 mmol/L 140 141 140  Potassium 3.5 - 5.1 mmol/L 4.3 4.5 4.5  Chloride 98 - 111 mmol/L 108 107 106  CO2 22 - 32 mmol/L 24 24 23   Calcium 8.9 - 10.3 mg/dL 9.5 9.5 9.2  Total Protein 6.5 - 8.1 g/dL 7.9 7.4 8.3(H)  Total Bilirubin 0.3 - 1.2 mg/dL 0.5 0.5 0.9  Alkaline Phos 38 - 126 U/L 68 - 71  AST 15 - 41 U/L 21 17 24   ALT 0 - 44 U/L 13 14 28    CBC Latest Ref Rng & Units 03/14/2021 03/06/2021 02/14/2021  WBC 4.0 - 10.5 K/uL 6.1 6.5 5.7  Hemoglobin 12.0 - 15.0 g/dL 13.8 13.4 15.8(H)  Hematocrit 36.0 - 46.0 % 42.8 43.3 51.2(H)  Platelets 150 - 400 K/uL 252 319 254    Lipid Panel Recent Labs    08/20/20 0952 03/06/21 0856  CHOL 232* 193  TRIG 186* 129  LDLCALC 153* 114*  HDL 47* 55  CHOLHDL 4.9 3.5   Lipid Panel     Component Value Date/Time   CHOL 193 03/06/2021 0856   TRIG 129 03/06/2021 0856   HDL 55 03/06/2021 0856   CHOLHDL 3.5 03/06/2021 0856   VLDL 23 04/09/2017 0844   LDLCALC 114 (H) 03/06/2021 0856     HEMOGLOBIN A1C No results found for: HGBA1C, MPG TSH Recent Labs    01/21/21 1630 03/06/21 0856  TSH 0.58 1.11   Medications and allergies   Allergies  Allergen Reactions  . Codeine Nausea And Vomiting    Weakness and dysequilibrium  . Statins Hives    Myalgias     Medication prior to this encounter:   Outpatient Medications Prior to Visit  Medication Sig Dispense Refill  . ammonium lactate (LAC-HYDRIN) 12 % lotion Apply topically as needed.    Marland Kitchen aspirin EC 81 MG tablet Take 81 mg by mouth daily. Swallow whole.    . Cholecalciferol (VITAMIN D) 50 MCG (2000 UT) CAPS Take 5,000 Units by mouth daily.    Marland Kitchen etanercept (ENBREL) 50 MG/ML injection Inject 50 mg into  the skin every Sunday.     Marland Kitchen  ezetimibe (ZETIA) 10 MG tablet Take 1 tablet (10 mg total) by mouth daily. 90 tablet 1  . HYDROcodone-acetaminophen (NORCO) 5-325 MG tablet Take 1 tablet by mouth at bedtime. 30 tablet 0  . levothyroxine (SYNTHROID) 88 MCG tablet Take one tablet by mouth once daily 30 minutes before breakfast on an empty stomach for thyroid. 90 tablet 3   No facility-administered medications prior to visit.    FINAL MEDICATION AS OF TODAY:   Current Meds  Medication Sig  . ammonium lactate (LAC-HYDRIN) 12 % lotion Apply topically as needed.  Marland Kitchen aspirin EC 81 MG tablet Take 81 mg by mouth daily. Swallow whole.  . Cholecalciferol (VITAMIN D) 50 MCG (2000 UT) CAPS Take 5,000 Units by mouth daily.  Marland Kitchen etanercept (ENBREL) 50 MG/ML injection Inject 50 mg into the skin every Sunday.   . ezetimibe (ZETIA) 10 MG tablet Take 1 tablet (10 mg total) by mouth daily.  Marland Kitchen HYDROcodone-acetaminophen (NORCO) 5-325 MG tablet Take 1 tablet by mouth at bedtime.  Marland Kitchen levothyroxine (SYNTHROID) 88 MCG tablet Take one tablet by mouth once daily 30 minutes before breakfast on an empty stomach for thyroid.  . simvastatin (ZOCOR) 10 MG tablet Take 1 tablet (10 mg total) by mouth at bedtime.   Medications after current encounter Current Outpatient Medications  Medication Instructions  . ammonium lactate (LAC-HYDRIN) 12 % lotion Topical, As needed  . aspirin EC 81 mg, Oral, Daily, Swallow whole.  . etanercept (ENBREL) 50 mg, Subcutaneous, Every Sun  . ezetimibe (ZETIA) 10 mg, Oral, Daily  . HYDROcodone-acetaminophen (NORCO) 5-325 MG tablet 1 tablet, Oral, Daily at bedtime  . levothyroxine (SYNTHROID) 88 MCG tablet Take one tablet by mouth once daily 30 minutes before breakfast on an empty stomach for thyroid.  . simvastatin (ZOCOR) 10 mg, Oral, Daily at bedtime  . Vitamin D 5,000 Units, Oral, Daily    Radiology:   No results found.  Cardiac Studies:   Echocardiogram 02/19/2021:   1. Left ventricular  ejection fraction, by estimation, is 60 to 65%. The left ventricle has normal function. The left ventricle has no regional wall motion abnormalities. There is mild left ventricular hypertrophy of the basal-septal segment. Left ventricular diastolic parameters are consistent with Grade I diastolic dysfunction (impaired relaxation).  2. Right ventricular systolic function is normal. The right ventricular size is normal. There is normal pulmonary artery systolic pressure. The estimated right ventricular systolic pressure is 41.3 mmHg.  3. The mitral valve is normal in structure. No evidence of mitral valve regurgitation. No evidence of mitral stenosis.  4. The aortic valve is calcified. There is severe calcifcation of the aortic valve. There is severe thickening of the aortic valve. Aortic valve regurgitation is not visualized. Severe aortic valve stenosis. Aortic valve area, by VTI measures 0.72 cm.  Aortic valve mean gradient measures 41.0 mmHg. Aortic valve Vmax measures 3.97 m/s.  5. The inferior vena cava is normal in size with greater than 50% respiratory variability, suggesting right atrial pressure of 3 mmHg.  EKG:    EKG 03/29/2021: Normal sinus rhythm at rate of 72 bpm, normal axis, incomplete right bundle branch block.  Otherwise normal EKG.      Assessment     ICD-10-CM   1. Severe aortic stenosis  I35.0 EKG 12-Lead    CBC    Basic metabolic panel  2. Dyspnea on exertion  R06.00   3. Bruit of right carotid artery  R09.89 PCV CAROTID DUPLEX (BILATERAL)  4. Mixed hyperlipidemia  E78.2 simvastatin (  ZOCOR) 10 MG tablet  5. Stage 3a chronic kidney disease (HCC)  N18.31   6. 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 Gait instability  R26.81 PCV CAROTID DUPLEX (BILATERAL)    Ambulatory referral to ENT     There are no discontinued medications.  Meds ordered this encounter  Medications  . simvastatin (ZOCOR) 10 MG tablet    Sig: Take 1 tablet (10 mg total) by mouth at  bedtime.    Dispense:  30 tablet    Refill:  2   Orders Placed This Encounter  Procedures  . CBC  . Basic metabolic panel  . Ambulatory referral to ENT    Referral Priority:   Routine    Referral Type:   Consultation    Referral Reason:   Specialty Services Required    Referred to Provider:   Leta Baptist, MD    Requested Specialty:   Otolaryngology    Number of Visits Requested:   1  . EKG 12-Lead   Recommendations:   Sydney Reid is a 80 y.o. Caucasian female patient with aortic stenosis, stage IIIa chronic kidney disease, hyperlipidemia, obstructive sleep apnea on CPAP, last evaluated by cardiology in 2017, recently underwent repeat echocardiogram on 02/19/2021 revealing severe aortic stenosis.  She is now referred to me for evaluation of valvular heart disease.  Over the past few months she has noticed very mild exertional dyspnea but attributed this to her age.  She does feel dyspneic when she climbs a flight of stairs and also states that at night when she lays down her chest feels heavy.  She has not limited her activities due to her symptoms.  There is no clinical evidence of heart failure.  Her physical examination includes aortic stenotic murmur and a very faint right carotid bruit no and faint bilateral pedal artery pulses, and destructive psoriatic arthritis involving her hands and feet.  Schedule for carotid duplex for bruit/follow-up surveillance of carotid stenosis.  Schedule for right and left heart catheterization.  Extensive discussion regarding risks and benefits of cardiac catheterization discussed with her and her husband at the bedside.  With regard to hyperlipidemia, previous stage she has not been able to tolerate statins due to hives, I have recommended that she try simvastatin 10 mg daily in the evening and continue Zetia.  She has gait instability and she is off balance and runs into objects, no history of fall or injury.  Would like her to be evaluated by  ENT, referral to Dr. Benjamine Mola made.  I will see her back after the test.  This was a complex consultation of >60 minutes with evaluation of external records, discussions regarding cardiac catheterization and coordination of care.   Adrian Prows, MD, [facility] 03/29/2021, 1:05 PM Office: [phone]

## 2021-04-04 ENCOUNTER — Other Ambulatory Visit: Payer: Medicare Other

## 2021-04-09 ENCOUNTER — Other Ambulatory Visit: Payer: Medicare Other

## 2021-04-11 ENCOUNTER — Other Ambulatory Visit: Payer: Self-pay

## 2021-04-11 DIAGNOSIS — G894 Chronic pain syndrome: Secondary | ICD-10-CM

## 2021-04-11 DIAGNOSIS — L4052 Psoriatic arthritis mutilans: Secondary | ICD-10-CM

## 2021-04-11 MED ORDER — HYDROCODONE-ACETAMINOPHEN 5-325 MG PO TABS: 1.0000 | ORAL_TABLET | Freq: Every day | ORAL | 0 refills | Status: DC

## 2021-04-11 NOTE — Telephone Encounter (Signed)
Incoming call received from patient stating she will be due for her hydrocodone refill  tomorrow.  RX last filled 03/14/2021, treatment agreement on file from 02/18/2021

## 2021-04-12 ENCOUNTER — Other Ambulatory Visit: Payer: Self-pay

## 2021-04-12 ENCOUNTER — Ambulatory Visit: Payer: Medicare Other

## 2021-04-12 DIAGNOSIS — R0989 Other specified symptoms and signs involving the circulatory and respiratory systems: Secondary | ICD-10-CM

## 2021-04-12 DIAGNOSIS — I6523 Occlusion and stenosis of bilateral carotid arteries: Secondary | ICD-10-CM

## 2021-04-12 DIAGNOSIS — R2681 Unsteadiness on feet: Secondary | ICD-10-CM

## 2021-04-12 DIAGNOSIS — I350 Nonrheumatic aortic (valve) stenosis: Secondary | ICD-10-CM | POA: Diagnosis not present

## 2021-04-13 LAB — BASIC METABOLIC PANEL
BUN/Creatinine Ratio: 15 (ref 12–28)
BUN: 17 mg/dL (ref 8–27)
CO2: 22 mmol/L (ref 20–29)
Calcium: 9.7 mg/dL (ref 8.7–10.3)
Chloride: 103 mmol/L (ref 96–106)
Creatinine, Ser: 1.1 mg/dL — ABNORMAL HIGH (ref 0.57–1.00)
Glucose: 101 mg/dL — ABNORMAL HIGH (ref 65–99)
Potassium: 4.3 mmol/L (ref 3.5–5.2)
Sodium: 140 mmol/L (ref 134–144)
eGFR: 50 mL/min/{1.73_m2} — ABNORMAL LOW (ref >59)

## 2021-04-13 LAB — CBC
Hematocrit: 47.9 % — ABNORMAL HIGH (ref 34.0–46.6)
Hemoglobin: 14.9 g/dL (ref 11.1–15.9)
MCH: 25.9 pg — ABNORMAL LOW (ref 26.6–33.0)
MCHC: 31.1 g/dL — ABNORMAL LOW (ref 31.5–35.7)
MCV: 83 fL (ref 79–97)
Platelets: 278 10*3/uL (ref 150–450)
RBC: 5.76 x10E6/uL — ABNORMAL HIGH (ref 3.77–5.28)
RDW: 13.1 % (ref 11.7–15.4)
WBC: 6.5 10*3/uL (ref 3.4–10.8)

## 2021-04-16 ENCOUNTER — Other Ambulatory Visit: Payer: Self-pay

## 2021-04-16 ENCOUNTER — Encounter (HOSPITAL_COMMUNITY)
Admission: RE | Disposition: A | Discharge status: Home / Self Care | Payer: Self-pay | Source: Home / Self Care | Attending: Cardiology

## 2021-04-16 ENCOUNTER — Ambulatory Visit (HOSPITAL_COMMUNITY)
Admission: RE | Admit: 2021-04-16 | Discharge: 2021-04-16 | Disposition: A | Discharge status: Home / Self Care | Payer: Medicare Other | Attending: Cardiology | Admitting: Cardiology

## 2021-04-16 DIAGNOSIS — R0609 Other forms of dyspnea: Secondary | ICD-10-CM | POA: Diagnosis not present

## 2021-04-16 DIAGNOSIS — L4052 Psoriatic arthritis mutilans: Secondary | ICD-10-CM | POA: Diagnosis not present

## 2021-04-16 DIAGNOSIS — Z87891 Personal history of nicotine dependence: Secondary | ICD-10-CM | POA: Insufficient documentation

## 2021-04-16 DIAGNOSIS — I350 Nonrheumatic aortic (valve) stenosis: Secondary | ICD-10-CM | POA: Diagnosis not present

## 2021-04-16 DIAGNOSIS — Z885 Allergy status to narcotic agent status: Secondary | ICD-10-CM | POA: Diagnosis not present

## 2021-04-16 DIAGNOSIS — Z79899 Other long term (current) drug therapy: Secondary | ICD-10-CM | POA: Diagnosis not present

## 2021-04-16 DIAGNOSIS — Z006 Encounter for examination for normal comparison and control in clinical research program: Secondary | ICD-10-CM

## 2021-04-16 DIAGNOSIS — Z79890 Hormone replacement therapy (postmenopausal): Secondary | ICD-10-CM | POA: Diagnosis not present

## 2021-04-16 DIAGNOSIS — N1831 Chronic kidney disease, stage 3a: Secondary | ICD-10-CM | POA: Insufficient documentation

## 2021-04-16 DIAGNOSIS — R2681 Unsteadiness on feet: Secondary | ICD-10-CM | POA: Diagnosis not present

## 2021-04-16 DIAGNOSIS — R0600 Dyspnea, unspecified: Secondary | ICD-10-CM | POA: Insufficient documentation

## 2021-04-16 DIAGNOSIS — E782 Mixed hyperlipidemia: Secondary | ICD-10-CM | POA: Insufficient documentation

## 2021-04-16 DIAGNOSIS — G4733 Obstructive sleep apnea (adult) (pediatric): Secondary | ICD-10-CM | POA: Insufficient documentation

## 2021-04-16 DIAGNOSIS — Z888 Allergy status to other drugs, medicaments and biological substances status: Secondary | ICD-10-CM | POA: Diagnosis not present

## 2021-04-16 DIAGNOSIS — I2510 Atherosclerotic heart disease of native coronary artery without angina pectoris: Secondary | ICD-10-CM | POA: Insufficient documentation

## 2021-04-16 DIAGNOSIS — Z7982 Long term (current) use of aspirin: Secondary | ICD-10-CM | POA: Diagnosis not present

## 2021-04-16 HISTORY — PX: RIGHT/LEFT HEART CATH AND CORONARY ANGIOGRAPHY: CATH118266

## 2021-04-16 LAB — POCT I-STAT EG7
Acid-base deficit: 2 mmol/L (ref 0.0–2.0)
Acid-base deficit: 3 mmol/L — ABNORMAL HIGH (ref 0.0–2.0)
Bicarbonate: 23.2 mmol/L (ref 20.0–28.0)
Bicarbonate: 23.9 mmol/L (ref 20.0–28.0)
Calcium, Ion: 1.2 mmol/L (ref 1.15–1.40)
Calcium, Ion: 1.24 mmol/L (ref 1.15–1.40)
HCT: 41 % (ref 36.0–46.0)
HCT: 42 % (ref 36.0–46.0)
Hemoglobin: 13.9 g/dL (ref 12.0–15.0)
Hemoglobin: 14.3 g/dL (ref 12.0–15.0)
O2 Saturation: 78 %
O2 Saturation: 78 %
Potassium: 3.8 mmol/L (ref 3.5–5.1)
Potassium: 3.9 mmol/L (ref 3.5–5.1)
Sodium: 144 mmol/L (ref 135–145)
Sodium: 145 mmol/L (ref 135–145)
TCO2: 25 mmol/L (ref 22–32)
TCO2: 25 mmol/L (ref 22–32)
pCO2, Ven: 43.6 mmHg — ABNORMAL LOW (ref 44.0–60.0)
pCO2, Ven: 44.3 mmHg (ref 44.0–60.0)
pH, Ven: 7.334 (ref 7.250–7.430)
pH, Ven: 7.34 (ref 7.250–7.430)
pO2, Ven: 45 mmHg (ref 32.0–45.0)
pO2, Ven: 45 mmHg (ref 32.0–45.0)

## 2021-04-16 LAB — POCT I-STAT 7, (LYTES, BLD GAS, ICA,H+H)
Acid-base deficit: 2 mmol/L (ref 0.0–2.0)
Bicarbonate: 23.6 mmol/L (ref 20.0–28.0)
Calcium, Ion: 1.21 mmol/L (ref 1.15–1.40)
HCT: 39 % (ref 36.0–46.0)
Hemoglobin: 13.3 g/dL (ref 12.0–15.0)
O2 Saturation: 100 %
Potassium: 3.7 mmol/L (ref 3.5–5.1)
Sodium: 143 mmol/L (ref 135–145)
TCO2: 25 mmol/L (ref 22–32)
pCO2 arterial: 43 mmHg (ref 32.0–48.0)
pH, Arterial: 7.347 — ABNORMAL LOW (ref 7.350–7.450)
pO2, Arterial: 183 mmHg — ABNORMAL HIGH (ref 83.0–108.0)

## 2021-04-16 SURGERY — RIGHT/LEFT HEART CATH AND CORONARY ANGIOGRAPHY
Anesthesia: LOCAL

## 2021-04-16 MED ORDER — LIDOCAINE HCL (PF) 1 % IJ SOLN
INTRAMUSCULAR | Status: AC
  Filled 2021-04-16: qty 30

## 2021-04-16 MED ORDER — ACETAMINOPHEN 325 MG PO TABS: 650.0000 mg | ORAL_TABLET | ORAL | Status: DC | PRN

## 2021-04-16 MED ORDER — LIDOCAINE HCL (PF) 1 % IJ SOLN
INTRAMUSCULAR | Status: DC | PRN
  Administered 2021-04-16: 4 mL

## 2021-04-16 MED ORDER — IOHEXOL 350 MG/ML SOLN
INTRAVENOUS | Status: DC | PRN
  Administered 2021-04-16: 40 mL via INTRA_ARTERIAL

## 2021-04-16 MED ORDER — FENTANYL CITRATE (PF) 100 MCG/2ML IJ SOLN
INTRAMUSCULAR | Status: AC
  Filled 2021-04-16: qty 2

## 2021-04-16 MED ORDER — SODIUM CHLORIDE 0.9 % IV SOLN: 250.0000 mL | INTRAVENOUS | Status: DC | PRN

## 2021-04-16 MED ORDER — MIDAZOLAM HCL 2 MG/2ML IJ SOLN
INTRAMUSCULAR | Status: DC | PRN
  Administered 2021-04-16: 1 mg via INTRAVENOUS

## 2021-04-16 MED ORDER — VERAPAMIL HCL 2.5 MG/ML IV SOLN
INTRAVENOUS | Status: AC
  Filled 2021-04-16: qty 2

## 2021-04-16 MED ORDER — ONDANSETRON HCL 4 MG/2ML IJ SOLN: 4.0000 mg | Freq: Four times a day (QID) | INTRAMUSCULAR | Status: DC | PRN

## 2021-04-16 MED ORDER — HEPARIN SODIUM (PORCINE) 1000 UNIT/ML IJ SOLN
INTRAMUSCULAR | Status: DC | PRN
  Administered 2021-04-16: 3000 [IU] via INTRAVENOUS

## 2021-04-16 MED ORDER — SODIUM CHLORIDE 0.9 % WEIGHT BASED INFUSION
3.0000 mL/kg/h | INTRAVENOUS | Status: AC
  Administered 2021-04-16: 3 mL/kg/h via INTRAVENOUS

## 2021-04-16 MED ORDER — SODIUM CHLORIDE 0.9 % WEIGHT BASED INFUSION: 1.0000 mL/kg/h | INTRAVENOUS | Status: DC

## 2021-04-16 MED ORDER — SODIUM CHLORIDE 0.9% FLUSH: 3.0000 mL | Freq: Two times a day (BID) | INTRAVENOUS | Status: DC

## 2021-04-16 MED ORDER — SODIUM CHLORIDE 0.9% FLUSH: 3.0000 mL | INTRAVENOUS | Status: DC | PRN

## 2021-04-16 MED ORDER — HEPARIN SODIUM (PORCINE) 1000 UNIT/ML IJ SOLN
INTRAMUSCULAR | Status: AC
  Filled 2021-04-16: qty 1

## 2021-04-16 MED ORDER — MIDAZOLAM HCL 2 MG/2ML IJ SOLN
INTRAMUSCULAR | Status: AC
  Filled 2021-04-16: qty 2

## 2021-04-16 MED ORDER — ASPIRIN 81 MG PO CHEW
81.0000 mg | CHEWABLE_TABLET | ORAL | Status: AC
Start: 2021-04-16 — End: 2021-04-16
  Administered 2021-04-16: 81 mg via ORAL
  Filled 2021-04-16: qty 1

## 2021-04-16 MED ORDER — HEPARIN (PORCINE) IN NACL 1000-0.9 UT/500ML-% IV SOLN
INTRAVENOUS | Status: DC | PRN
  Administered 2021-04-16 (×2): 500 mL

## 2021-04-16 MED ORDER — VERAPAMIL HCL 2.5 MG/ML IV SOLN
INTRAVENOUS | Status: DC | PRN
  Administered 2021-04-16: 10 mL via INTRA_ARTERIAL

## 2021-04-16 MED ORDER — FENTANYL CITRATE (PF) 100 MCG/2ML IJ SOLN
INTRAMUSCULAR | Status: DC | PRN
  Administered 2021-04-16: 25 ug via INTRAVENOUS

## 2021-04-16 MED ORDER — HEPARIN (PORCINE) IN NACL 1000-0.9 UT/500ML-% IV SOLN
INTRAVENOUS | Status: AC
  Filled 2021-04-16: qty 1000

## 2021-04-16 SURGICAL SUPPLY — 13 items
CATH BALLN WEDGE 5F 110CM (CATHETERS) ×2 IMPLANT
CATH OPTITORQUE TIG 4.0 5F (CATHETERS) ×2 IMPLANT
DEVICE RAD TR BAND REGULAR (VASCULAR PRODUCTS) ×2 IMPLANT
GLIDESHEATH SLEND A-KIT 6F 22G (SHEATH) ×2 IMPLANT
GUIDEWIRE INQWIRE 1.5J.035X260 (WIRE) ×1 IMPLANT
INQWIRE 1.5J .035X260CM (WIRE) ×2
KIT HEART LEFT (KITS) ×2 IMPLANT
PACK CARDIAC CATHETERIZATION (CUSTOM PROCEDURE TRAY) ×2 IMPLANT
SHEATH GLIDE SLENDER 4/5FR (SHEATH) ×2 IMPLANT
SHEATH PROBE COVER 6X72 (BAG) ×2 IMPLANT
TRANSDUCER W/STOPCOCK (MISCELLANEOUS) ×2 IMPLANT
TUBING CIL FLEX 10 FLL-RA (TUBING) ×2 IMPLANT
WIRE HI TORQ VERSACORE-J 145CM (WIRE) ×2 IMPLANT

## 2021-04-16 NOTE — Research (Signed)
IDENTIFY Informed Consent                  Subject Name: Sydney Reid. Watcher    Subject met inclusion and exclusion criteria.  The informed consent form, study requirements and expectations were reviewed with the subject and questions and concerns were addressed prior to the signing of the consent form.  The subject verbalized understanding of the trial requirements.  The subject agreed to participate in the IDENTIFY trial and signed the informed consent at 11:22AM on 04/16/21.  The informed consent was obtained prior to performance of any protocol-specific procedures for the subject.  A copy of the signed informed consent was given to the subject and a copy was placed in the subject's medical record.   Meade Maw , [employer]

## 2021-04-16 NOTE — Interval H&P Note (Signed)
History and Physical Interval Note:  04/16/2021 3:32 PM  Sydney Reid  has presented today for surgery, with the diagnosis of aortic stenosis - sob.  The various methods of treatment have been discussed with the patient and family. After consideration of risks, benefits and other options for treatment, the patient has consented to  Procedure(s): RIGHT/LEFT HEART CATH AND CORONARY ANGIOGRAPHY (N/A) as a surgical intervention.  The patient's history has been reviewed, patient examined, no change in status, stable for surgery.  I have reviewed the patient's chart and labs.  Questions were answered to the patient's satisfaction.     Adrian Prows

## 2021-04-17 ENCOUNTER — Encounter (HOSPITAL_COMMUNITY): Payer: Self-pay | Admitting: Cardiology

## 2021-04-19 ENCOUNTER — Telehealth: Payer: Self-pay | Admitting: Cardiology

## 2021-04-22 NOTE — Progress Notes (Signed)
Moderate right carotid and mild left carotid disease and will recheck in 6 months

## 2021-04-29 ENCOUNTER — Other Ambulatory Visit: Payer: Self-pay | Admitting: Nurse Practitioner

## 2021-05-01 ENCOUNTER — Ambulatory Visit (INDEPENDENT_AMBULATORY_CARE_PROVIDER_SITE_OTHER): Payer: Medicare Other | Admitting: Cardiovascular Disease

## 2021-05-01 ENCOUNTER — Other Ambulatory Visit: Payer: Self-pay

## 2021-05-01 ENCOUNTER — Encounter: Payer: Self-pay | Admitting: Cardiovascular Disease

## 2021-05-01 VITALS — BP 130/90 | HR 97 | Ht 65.0 in | Wt 147.2 lb

## 2021-05-01 DIAGNOSIS — I350 Nonrheumatic aortic (valve) stenosis: Secondary | ICD-10-CM

## 2021-05-01 DIAGNOSIS — I6523 Occlusion and stenosis of bilateral carotid arteries: Secondary | ICD-10-CM

## 2021-05-01 MED ORDER — METOPROLOL TARTRATE 50 MG PO TABS: ORAL_TABLET | ORAL | 0 refills | Status: DC

## 2021-05-01 NOTE — Telephone Encounter (Signed)
Patient has appt

## 2021-05-01 NOTE — Progress Notes (Signed)
[facility]   MULTIDISCIPLINARY HEART VALVE TEAM  Date:  05/06/2021   ID:  MEE MACDONNELL, DOB 05/30/1941, MRN [id number]  PCP:  [name], NP   Chief Complaint  Patient presents with   Shortness of Breath     HISTORY OF PRESENT ILLNESS: Sydney Reid is a 80 y.o. female who presents for evaluation of severe symptomatic aortic stenosis,, referred by Dr Einar Gip for TAVR evaluation.   She was recently evaluated by Dr. Einar Gip March 29, 2021 after being referred for evaluation of aortic stenosis.  At that time, the patient described progressive exertional dyspnea and occasional chest heaviness when lying supine.  An echocardiogram from February 19, 2021 demonstrated normal LV systolic function with LVEF 60 to 65%, mild LVH of the basal septum, normal RV size and systolic function, normal mitral valve function, and calcific aortic valve stenosis with severe thickening and an aortic valve peak gradient of 3.97 m/s, mean gradient 41 mmHg, and calculated valve area 0.72 cm.  She underwent cardiac catheterization demonstrating patency of the left main, moderately severe stenosis of the circumflex with a 60 to 70% proximal stenosis and an 80% calcific stenosis of the third obtuse marginal.  The LAD had mild nonobstructive disease with mild calcification in the RCA had mild diffuse plaquing with no significant stenosis.  Her peak to peak transaortic gradient in the Cath Lab was 33 mmHg and mean gradient 29 mmHg with a calculated aortic valve area of 0.88 cm.  Cardiac output and index were normal and right heart pressures were normal.  The patient is here with her husband today. States that she initially complained about her heart when she quit smoking in the 1970's, but there were no significant issues found at that time. She has also had issues with 'skipped heart beats' and 'tachycardia.' She wore a heart monitor and no significant arrhythmia was found. She was first told of a heart  murmur about 5 years. The patient exercised regularly until about 5 years ago, but hasn't exercised much over recent years. She's not able to do a lot of walking because of back problems. She's had some limitation from psoriatic arthritis but has done very well with this. She has developed fatigue and mild exertional dyspnea over the last 3 months. She denies chest pain or pressure with activity. No lightheadedness or syncope. Symptoms occur with moderate level activity but not with her ADL's.     Past Medical History:  Diagnosis Date   H/O total knee replacement 2001   Right   High cholesterol    History of tonsillectomy and adenoidectomy 1946   Psoriatic arthritis ([place]) 1990   Sleep apnea with use of continuous positive airway pressure (CPAP) 2004   Thyroid disease     Current Outpatient Medications  Medication Sig Dispense Refill   ammonium lactate (LAC-HYDRIN) 12 % lotion Apply 1 application topically as needed for dry skin.     aspirin EC 81 MG tablet Take 81 mg by mouth daily. Swallow whole.     Cholecalciferol 1.25 MG (50000 UT) capsule Take 50,000 Units by mouth daily. 10,000 units taking 5 days a week, no weekend.     etanercept (ENBREL) 50 MG/ML injection Inject 50 mg into the skin every Sunday.      ezetimibe (ZETIA) 10 MG tablet Take 1 tablet (10 mg total) by mouth daily. 90 tablet 1   HYDROcodone-acetaminophen (NORCO) 5-325 MG tablet Take 1 tablet by mouth at bedtime. 30 tablet 0  levothyroxine (SYNTHROID) 88 MCG tablet Take one tablet by mouth once daily 30 minutes before breakfast on an empty stomach for thyroid. 90 tablet 3   metoprolol tartrate (LOPRESSOR) 50 MG tablet Take 1 tablet (50 mg) 2 hours prior to your CT scan (at 8:45AM). Bring the second tablet with you but do not take unless instructed. 2 tablet 0   simvastatin (ZOCOR) 10 MG tablet Take 1 tablet (10 mg total) by mouth at bedtime. 30 tablet 2   No current facility-administered medications for this visit.     ALLERGIES:   Codeine and Statins   SOCIAL HISTORY:  The patient  reports that she quit smoking about 47 years ago. Her smoking use included cigarettes. She has a 36.00 pack-year smoking history. She has never used smokeless tobacco. She reports current alcohol use of about 1.0 - 2.0 standard drink of alcohol per week. She reports that she does not use drugs.   FAMILY HISTORY:  The patient's family history includes Heart attack in her father; Heart disease in her sister.   REVIEW OF SYSTEMS:  Positive for back pain, hand arthritis, neck pain.   All other systems are reviewed and negative.   PHYSICAL EXAM: VS:  BP 130/90   Pulse 97   Ht 5\' 5"  (1.651 m)   Wt 147 lb 3.2 oz (66.8 kg)   SpO2 98%   BMI 24.50 kg/m  , BMI Body mass index is 24.5 kg/m. GEN: Well nourished, well developed, in no acute distress HEENT: normal Neck: No JVD. carotids 2+ without bruits or masses. Minimal ROM of the neck.  Cardiac: The heart is RRR with 3/6 harsh late peaking systolic murmur at the RUSB, diminished A2. No edema. Respiratory:  clear to auscultation bilaterally GI: soft, nontender, nondistended, + BS MS: bilateral hand deformities Skin: warm and dry, no rash Neuro:  Strength and sensation are intact Psych: euthymic mood, full affect  EKG:  EKG from 03/29/21 reviewed and demonstrates NSR with incomplete RBBB  RECENT LABS: 03/06/2021: TSH 1.11 03/14/2021: ALT 13 04/12/2021: BUN 17; Creatinine, Ser 1.10; Platelets 278 04/16/2021: Hemoglobin 13.3; Potassium 3.7; Sodium 143  03/06/2021: Cholesterol 193; HDL 55; LDL Cholesterol (Calc) 114; Total CHOL/HDL Ratio 3.5; Triglycerides 129   CrCl cannot be calculated (Patient's most recent lab result is older than the maximum 21 days allowed.).   Wt Readings from Last 3 Encounters:  05/01/21 147 lb 3.2 oz (66.8 kg)  04/16/21 146 lb (66.2 kg)  03/29/21 150 lb 6.4 oz (68.2 kg)     CARDIAC STUDIES: Echo:  FINDINGS   Left Ventricle: Left ventricular  ejection fraction, by estimation, is 60  to 65%. The left ventricle has normal function. The left ventricle has no  regional wall motion abnormalities. The left ventricular internal cavity  size was normal in size. There is   mild left ventricular hypertrophy of the basal-septal segment. Left  ventricular diastolic parameters are consistent with Grade I diastolic  dysfunction (impaired relaxation). Normal left ventricular filling  pressure.   Right Ventricle: The right ventricular size is normal. No increase in  right ventricular wall thickness. Right ventricular systolic function is  normal. There is normal pulmonary artery systolic pressure. The tricuspid  regurgitant velocity is 2.52 m/s, and   with an assumed right atrial pressure of 3 mmHg, the estimated right  ventricular systolic pressure is 37.8 mmHg.   Left Atrium: Left atrial size was normal in size.   Right Atrium: Right atrial size was normal in size.  Pericardium: There is no evidence of pericardial effusion.   Mitral Valve: The mitral valve is normal in structure. No evidence of  mitral valve regurgitation. No evidence of mitral valve stenosis.   Tricuspid Valve: The tricuspid valve is normal in structure. Tricuspid  valve regurgitation is not demonstrated. No evidence of tricuspid  stenosis.   Aortic Valve: The aortic valve is calcified. There is severe calcifcation  of the aortic valve. There is severe thickening of the aortic valve. There  is severe aortic valve annular calcification. Aortic valve regurgitation  is not visualized. Severe aortic   stenosis is present. Aortic valve mean gradient measures 41.0 mmHg.  Aortic valve peak gradient measures 63.0 mmHg. Aortic valve area, by VTI  measures 0.72 cm.   Pulmonic Valve: The pulmonic valve was normal in structure. Pulmonic valve  regurgitation is not visualized. No evidence of pulmonic stenosis.   Aorta: The aortic root is normal in size and structure.    Venous: The inferior vena cava is normal in size with greater than 50%  respiratory variability, suggesting right atrial pressure of 3 mmHg.   IAS/Shunts: The interatrial septum appears to be lipomatous. No atrial  level shunt detected by color flow Doppler.      LEFT VENTRICLE  PLAX 2D  LVIDd:         3.40 cm  Diastology  LVIDs:         2.30 cm  LV e' medial:    6.42 cm/s  LV PW:         1.00 cm  LV E/e' medial:  8.6  LV IVS:        1.00 cm  LV e' lateral:   8.16 cm/s  LVOT diam:     1.90 cm  LV E/e' lateral: 6.8  LV SV:         52  LV SV Index:   30  LVOT Area:     2.84 cm      RIGHT VENTRICLE             IVC  RV Basal diam:  3.20 cm     IVC diam: 1.30 cm  RV S prime:     12.95 cm/s  TAPSE (M-mode): 1.7 cm   LEFT ATRIUM             Index       RIGHT ATRIUM           Index  LA diam:        4.10 cm 2.34 cm/m  RA Area:     11.30 cm  LA Vol (A2C):   23.7 ml 13.53 ml/m RA Volume:   27.00 ml  15.42 ml/m  LA Vol (A4C):   19.6 ml 11.19 ml/m  LA Biplane Vol: 23.3 ml 13.30 ml/m   AORTIC VALVE  AV Area (Vmax):    0.58 cm  AV Area (Vmean):   0.56 cm  AV Area (VTI):     0.72 cm  AV Vmax:           397.00 cm/s  AV Vmean:          311.000 cm/s  AV VTI:            0.719 m  AV Peak Grad:      63.0 mmHg  AV Mean Grad:      41.0 mmHg  LVOT Vmax:         81.80 cm/s  LVOT Vmean:  61.550 cm/s  LVOT VTI:          0.183 m  LVOT/AV VTI ratio: 0.25     AORTA  Ao Root diam: 3.00 cm   MITRAL VALVE               TRICUSPID VALVE  MV Area (PHT): 2.91 cm    TR Peak grad:   25.4 mmHg  MV Decel Time: 261 msec    TR Vmax:        252.00 cm/s  MV E velocity: 55.30 cm/s  MV A velocity: 86.80 cm/s  SHUNTS  MV E/A ratio:  0.64        Systemic VTI:  0.18 m                             Systemic Diam: 1.90 cm   Cardiac Cath:  Conclusion  Right and left heart catheterization 04/16/2021: Hemodynamic data: RA 2/1, mean 0 mmHg.  RV 24/-2, EDP 1 mmHg.  PA 24/6, mean 14 mmHg.  PW 9/4,  mean 5 mmHg. PA saturation 78%, aortic saturation 100%.  Cardiac output by Fick was 5.16 with a cardiac index of 2.98. LV: 177/2, EDP 13 mmHg.  Aorta: 137/67, mean 95 mmHg. Peak to peak gradient 33 with a mean gradient of 29.4 mmHg.  Calculated aortic valve area 0.88 cm. Angiographic data: Left main: Mildly calcified with mild luminal irregularity. CX: Large vessel, giving origin to small marginals and continues in the AV groove.  Proximal CX has calcified 60 to 70% stenosis.  OM 3 is the largest vessel with a proximal 80% calcific stenosis.  OM 1 and 2 are very small. LAD: Large-caliber vessel, mild diffuse disease, mild calcification is evident in the proximal and mid segment.  D1 and D2 are moderate sized without significant disease. RCA: Large-caliber vessel with mild diffuse disease and mild coronary calcification. Rec: Patient has severe aortic stenosis by echocardiogram with a mean gradient >40 mmHg.  She now has severe aortic stenosis with symptomatic shortness of breath and aortic stenosis related angina pectoris at night, no exertional chest pain.  Although she has significant stenosis in the circumflex, do not think she needs intervention to the circumflex.  She will benefit from TAVR evaluation.  50 mL contrast utilized.   STS RISK CALCULATOR: Isolated AVR: Risk of Mortality: 2.811% Renal Failure: 0.857% Permanent Stroke: 2.421% Prolonged Ventilation: 6.072% DSW Infection: 0.068% Reoperation: 4.318% Morbidity or Mortality: 11.925% Short Length of Stay: 35.859% Long Length of Stay: 5.147%   ASSESSMENT AND PLAN: 80 year old woman with single-vessel coronary artery disease of the left circumflex, extensive psoriatic arthritis, and polycythemia vera, who has developed severe, stage D1 aortic stenosis with relatively mild symptoms of New York Heart Association functional class II dyspnea and fatigue.  I have reviewed the natural history of aortic stenosis with the patient  and their family members who are present today. We have discussed the limitations of medical therapy and the poor prognosis associated with symptomatic aortic stenosis. We have reviewed potential treatment options, including palliative medical therapy, conventional surgical aortic valve replacement, and transcatheter aortic valve replacement. We discussed treatment options in the context of the patient's specific comorbid medical conditions.    The patient's echocardiogram and cardiac catheterization images were personally reviewed.  Her echocardiogram demonstrates a trileaflet, calcified and severely restricted aortic valve with peak transaortic velocity of 397 cm/s, peak and mean transvalvular gradients of 63 and 41 mmHg, respectively, and  dimensionless index of 0.2.  The patient's calculated aortic valve area is 0.57-0.63 by echo.  Otherwise her LV and RV function are fully intact and there is no other significant valvular disease.  Cardiac catheterization data is reviewed as detailed above in the history of present illness.  She has single-vessel calcific coronary artery disease involving the left circumflex and obtuse marginal.  She otherwise has nonobstructive plaquing of the left main, LAD, and right coronary arteries.  With an absence of anginal chest discomfort, I agree that medical therapy is appropriate.  We discussed treatment considerations around the presence of severe aortic stenosis.  While the patient is reluctant to have any procedures done, she understands that her valve would be predicted to worsen over the next 1 to 2 years and this is associated with a high morbidity and mortality.  She has developed mild symptoms and clearly meets criteria for severe aortic stenosis.  Further work-up would involve a gated cardiac CTA and a CTA of the chest, abdomen, and pelvis.  Once her CT scans are completed, I will refer her to Dr. Cyndia Bent for formal cardiac surgical consultation as part of a  multidisciplinary evaluation.  The patient would like to wait until at least September or October to proceed with TAVR.  She is willing to move forward with her preoperative studies which will be arranged.  We will set her surgical consultation up with Dr. Cyndia Bent a little closer to the time that she would like to undergo TAVR.  I do not think she would be a candidate for surgical aortic valve intervention unless her anatomy was not amenable to TAVR.  Her surgical risk would be quite high in the setting of her advanced age and medical problems described above with advanced psoriatic arthritis.  Deatra James 05/06/2021 9:18 AM     [facility] [address] [place] [address]  [phone] (office) [phone] (fax)

## 2021-05-01 NOTE — Patient Instructions (Addendum)
Your provider recommends that you return for lab work prior to your CT scan on: You may come any time between 7:30AM and 4:30PM and you do not need to be fasting.  Pre-TAVR Testing:  You are scheduled for pre TAVR testing on Friday, August 26 at [facility] (Main Entrance A, Valet Parking). Please check in at 10:15AM in Radiology (first floor). Your CT scans (of chest/abdomen/pelvis/heart) will begin at 10:45AM.  Please follow these instructions carefully:  On the Night Before the Test: Be sure to Drink plenty of water. Do not consume any caffeinated/decaffeinated beverages or chocolate 12 hours prior to your test. Do not take any antihistamines 12 hours prior to your test.  On the Day of the Test: Drink plenty of water until 1 hour prior to the test. Do not eat any food 4 hours prior to the test. You may take your regular medications prior to the test EXCEPT: Take metoprolol (Lopressor) 50 mg two hours prior to test. Bring your second tablet with you to the appointment but do not take unless instructed by a nurse. Please wear underwire-free bra if available  After the Test: Drink plenty of water. After receiving IV contrast, you may experience a mild flushed feeling. This is normal. On occasion, you may experience a mild rash up to 24 hours after the test. This is not dangerous. If this occurs, you can take Benadryl 25 mg and increase your fluid intake. If you experience trouble breathing, this can be serious. If it is severe call 911 IMMEDIATELY. If it is mild, please call our offic.

## 2021-05-06 ENCOUNTER — Encounter: Payer: Self-pay | Admitting: Cardiovascular Disease

## 2021-05-08 ENCOUNTER — Other Ambulatory Visit: Payer: Self-pay | Admitting: *Deleted

## 2021-05-08 DIAGNOSIS — G894 Chronic pain syndrome: Secondary | ICD-10-CM

## 2021-05-08 DIAGNOSIS — L4052 Psoriatic arthritis mutilans: Secondary | ICD-10-CM

## 2021-05-08 MED ORDER — HYDROCODONE-ACETAMINOPHEN 5-325 MG PO TABS: 1.0000 | ORAL_TABLET | Freq: Every day | ORAL | 0 refills | Status: DC

## 2021-05-08 NOTE — Telephone Encounter (Signed)
Patient requested refill  Epic LR: 04/11/2021 Contract on File  Patient is going out of town on Thursday and requesting Rx.  Pended Rx and sent to [facility] for approval.

## 2021-05-09 ENCOUNTER — Ambulatory Visit: Payer: Medicare Other | Admitting: Cardiology

## 2021-05-09 ENCOUNTER — Other Ambulatory Visit: Payer: Self-pay

## 2021-05-09 ENCOUNTER — Encounter: Payer: Self-pay | Admitting: Cardiology

## 2021-05-09 VITALS — BP 100/67 | HR 90 | Temp 98.7°F | Resp 16 | Ht 65.0 in | Wt 149.4 lb

## 2021-05-09 DIAGNOSIS — E782 Mixed hyperlipidemia: Secondary | ICD-10-CM

## 2021-05-09 DIAGNOSIS — I6523 Occlusion and stenosis of bilateral carotid arteries: Secondary | ICD-10-CM | POA: Diagnosis not present

## 2021-05-09 DIAGNOSIS — R0609 Other forms of dyspnea: Secondary | ICD-10-CM

## 2021-05-09 DIAGNOSIS — I350 Nonrheumatic aortic (valve) stenosis: Secondary | ICD-10-CM | POA: Diagnosis not present

## 2021-05-09 DIAGNOSIS — R0600 Dyspnea, unspecified: Secondary | ICD-10-CM

## 2021-05-09 NOTE — Progress Notes (Signed)
Primary Physician/Referring:  [name], NP  Patient ID: Sydney Reid, female    DOB: Mar 14, 1941, 80 y.o.   MRN: [id number]  Chief Complaint  Patient presents with   Severe aortic stenosis   Follow-up   HPI:    Sydney Reid  is a 79 y.o. Caucasian female patient with aortic stenosis, stage IIIa chronic kidney disease, hyperlipidemia, obstructive sleep apnea on CPAP, last evaluated by cardiology in 2017, recently underwent repeat echocardiogram on 02/19/2021 revealing severe aortic stenosis.  She is now referred to me for evaluation of valvular heart disease.  She was evaluated by Dr. Sherren Mocha for TAVR.  Patient has tentatively schedule this for sometime in September 2022 but is thinking about rescheduling to her later date.  Dyspnea has remained stable, no PND or orthopnea.  She has occasional palpitations.  Her other complaint is gait instability and she feels loss of coordination and runs into objects, no history of injury or fall.  Denies dizziness or syncope.  Happens sporadically without reasons.  Past Medical History:  Diagnosis Date   H/O total knee replacement 2001   Right   High cholesterol    History of tonsillectomy and adenoidectomy 1946   Psoriatic arthritis ([place]) 1990   Sleep apnea with use of continuous positive airway pressure (CPAP) 2004   Thyroid disease    Past Surgical History:  Procedure Laterality Date   FOOT SURGERY     fused arch in left foot   RIGHT/LEFT HEART CATH AND CORONARY ANGIOGRAPHY N/A 04/16/2021   Procedure: RIGHT/LEFT HEART CATH AND CORONARY ANGIOGRAPHY;  Surgeon: Adrian Prows, MD;  Location: [facility];  Service: Cardiovascular;  Laterality: N/A;   SPINE SURGERY     TOTAL KNEE ARTHROPLASTY Left    Family History  Problem Relation Age of Onset   Heart attack Father    Heart disease Sister     Social History   Tobacco Use   Smoking status: Former    Packs/day: 2.00    Years: 18.00    Pack years: 36.00     Types: Cigarettes    Quit date: 1975    Years since quitting: 47.5   Smokeless tobacco: Never  Substance Use Topics   Alcohol use: Yes    Alcohol/week: 1.0 - 2.0 standard drink    Types: 1 - 2 Standard drinks or equivalent per week    Comment: Social drinker, couple times a week   Marital Status: Married  ROS  Review of Systems  Cardiovascular:  Positive for dyspnea on exertion. Negative for chest pain and leg swelling.  Musculoskeletal:  Positive for arthritis and back pain.  Gastrointestinal:  Negative for melena.  Neurological:  Positive for disturbances in coordination.  Objective  Blood pressure 100/67, pulse 90, temperature 98.7 F (37.1 C), temperature source Temporal, resp. rate 16, height 5\' 5"  (1.651 m), weight 149 lb 6.4 oz (67.8 kg), SpO2 98 %. Body mass index is 24.86 kg/m.  Vitals with BMI 05/09/2021 05/01/2021 04/16/2021  Height 5\' 5"  5\' 5"  -  Weight 149 lbs 6 oz 147 lbs 3 oz -  BMI 37.10 62.6 -  Systolic 948 546 -  Diastolic 67 90 -  Pulse 90 97 80     Physical Exam Constitutional:      Comments: Short stature  Neck:     Vascular: No carotid bruit or JVD.  Cardiovascular:     Rate and Rhythm: Normal rate and regular rhythm.     Pulses:  Intact distal pulses.     Heart sounds: Murmur heard.  Harsh crescendo midsystolic murmur is present with a grade of 3/6 at the upper right sternal border radiating to the neck.    No gallop.  Pulmonary:     Effort: Pulmonary effort is normal.     Breath sounds: Normal breath sounds.  Abdominal:     General: Bowel sounds are normal.     Palpations: Abdomen is soft.  Musculoskeletal:        General: No swelling.     Laboratory examination:   Recent Labs    08/20/20 0952 01/21/21 1630 01/29/21 1125 03/06/21 0856 03/14/21 1141 04/12/21 1002 04/16/21 1557 04/16/21 1559 04/16/21 1606  NA 139 141 140 141 140 140 144 145 143  K 4.4 4.4 4.5 4.5 4.3 4.3 3.8 3.9 3.7  CL 107 108 106 107 108 103  --   --   --   CO2 25  22 23 24 24 22   --   --   --   GLUCOSE 91 90 102* 90 95 101*  --   --   --   BUN 18 16 17 15 17 17   --   --   --   CREATININE 1.12* 0.98* 1.16* 1.21* 1.05* 1.10*  --   --   --   CALCIUM 9.4 9.8 9.2 9.5 9.5 9.7  --   --   --   GFRNONAA 46* 54* 48* 42* 53*  --   --   --   --   GFRAA 54* 63  --  49*  --   --   --   --   --    CrCl cannot be calculated (Patient's most recent lab result is older than the maximum 21 days allowed.).  CMP Latest Ref Rng & Units 04/16/2021 04/16/2021 04/16/2021  Glucose 65 - 99 mg/dL - - -  BUN 8 - 27 mg/dL - - -  Creatinine 0.57 - 1.00 mg/dL - - -  Sodium 135 - 145 mmol/L 143 145 144  Potassium 3.5 - 5.1 mmol/L 3.7 3.9 3.8  Chloride 96 - 106 mmol/L - - -  CO2 20 - 29 mmol/L - - -  Calcium 8.7 - 10.3 mg/dL - - -  Total Protein 6.5 - 8.1 g/dL - - -  Total Bilirubin 0.3 - 1.2 mg/dL - - -  Alkaline Phos 38 - 126 U/L - - -  AST 15 - 41 U/L - - -  ALT 0 - 44 U/L - - -   CBC Latest Ref Rng & Units 04/16/2021 04/16/2021 04/16/2021  WBC 3.4 - 10.8 x10E3/uL - - -  Hemoglobin 12.0 - 15.0 g/dL 13.3 14.3 13.9  Hematocrit 36.0 - 46.0 % 39.0 42.0 41.0  Platelets 150 - 450 x10E3/uL - - -    Lipid Panel Recent Labs    08/20/20 0952 03/06/21 0856  CHOL 232* 193  TRIG 186* 129  LDLCALC 153* 114*  HDL 47* 55  CHOLHDL 4.9 3.5   Lipid Panel     Component Value Date/Time   CHOL 193 03/06/2021 0856   TRIG 129 03/06/2021 0856   HDL 55 03/06/2021 0856   CHOLHDL 3.5 03/06/2021 0856   VLDL 23 04/09/2017 0844   LDLCALC 114 (H) 03/06/2021 0856     HEMOGLOBIN A1C No results found for: HGBA1C, MPG TSH Recent Labs    01/21/21 1630 03/06/21 0856  TSH 0.58 1.11   Medications and allergies   Allergies  Allergen Reactions   Codeine Nausea And Vomiting    Weakness and dysequilibrium   Statins Hives    Myalgias     Medication prior to this encounter:   Outpatient Medications Prior to Visit  Medication Sig Dispense Refill   ammonium lactate (LAC-HYDRIN) 12 %  lotion Apply 1 application topically as needed for dry skin.     aspirin EC 81 MG tablet Take 81 mg by mouth daily. Swallow whole.     Cholecalciferol 1.25 MG (50000 UT) capsule Take 50,000 Units by mouth daily. 10,000 units taking 5 days a week, no weekend.     etanercept (ENBREL) 50 MG/ML injection Inject 50 mg into the skin every Sunday.      ezetimibe (ZETIA) 10 MG tablet Take 1 tablet (10 mg total) by mouth daily. 90 tablet 1   HYDROcodone-acetaminophen (NORCO) 5-325 MG tablet Take 1 tablet by mouth at bedtime. 30 tablet 0   levothyroxine (SYNTHROID) 88 MCG tablet Take one tablet by mouth once daily 30 minutes before breakfast on an empty stomach for thyroid. 90 tablet 3   simvastatin (ZOCOR) 10 MG tablet Take 1 tablet (10 mg total) by mouth at bedtime. 30 tablet 2   metoprolol tartrate (LOPRESSOR) 50 MG tablet Take 1 tablet (50 mg) 2 hours prior to your CT scan (at 8:45AM). Bring the second tablet with you but do not take unless instructed. (Patient not taking: Reported on 05/09/2021) 2 tablet 0   No facility-administered medications prior to visit.    FINAL MEDICATION AS OF TODAY:   Current Meds  Medication Sig   ammonium lactate (LAC-HYDRIN) 12 % lotion Apply 1 application topically as needed for dry skin.   aspirin EC 81 MG tablet Take 81 mg by mouth daily. Swallow whole.   Cholecalciferol 1.25 MG (50000 UT) capsule Take 50,000 Units by mouth daily. 10,000 units taking 5 days a week, no weekend.   etanercept (ENBREL) 50 MG/ML injection Inject 50 mg into the skin every Sunday.    ezetimibe (ZETIA) 10 MG tablet Take 1 tablet (10 mg total) by mouth daily.   HYDROcodone-acetaminophen (NORCO) 5-325 MG tablet Take 1 tablet by mouth at bedtime.   levothyroxine (SYNTHROID) 88 MCG tablet Take one tablet by mouth once daily 30 minutes before breakfast on an empty stomach for thyroid.   simvastatin (ZOCOR) 10 MG tablet Take 1 tablet (10 mg total) by mouth at bedtime.   Medications after current  encounter Current Outpatient Medications  Medication Instructions   ammonium lactate (LAC-HYDRIN) 12 % lotion 1 application, Topical, As needed   aspirin EC 81 mg, Oral, Daily, Swallow whole.   Cholecalciferol 50,000 Units, Oral, Daily, 10,000 units taking 5 days a week, no weekend.   etanercept (ENBREL) 50 mg, Subcutaneous, Every Sun   ezetimibe (ZETIA) 10 mg, Oral, Daily   HYDROcodone-acetaminophen (NORCO) 5-325 MG tablet 1 tablet, Oral, Daily at bedtime   levothyroxine (SYNTHROID) 88 MCG tablet Take one tablet by mouth once daily 30 minutes before breakfast on an empty stomach for thyroid.   metoprolol tartrate (LOPRESSOR) 50 MG tablet Take 1 tablet (50 mg) 2 hours prior to your CT scan (at 8:45AM). Bring the second tablet with you but do not take unless instructed.   simvastatin (ZOCOR) 10 mg, Oral, Daily at bedtime    Radiology:   No results found.  Cardiac Studies:   Echocardiogram 02/19/2021:   1. Left ventricular ejection fraction, by estimation, is 60 to 65%. The left ventricle has normal function. The  left ventricle has no regional wall motion abnormalities. There is mild left ventricular hypertrophy of the basal-septal segment. Left ventricular diastolic parameters are consistent with Grade I diastolic dysfunction (impaired relaxation).  2. Right ventricular systolic function is normal. The right ventricular size is normal. There is normal pulmonary artery systolic pressure. The estimated right ventricular systolic pressure is 41.3 mmHg.  3. The mitral valve is normal in structure. No evidence of mitral valve regurgitation. No evidence of mitral stenosis.  4. The aortic valve is calcified. There is severe calcifcation of the aortic valve. There is severe thickening of the aortic valve. Aortic valve regurgitation is not visualized. Severe aortic valve stenosis. Aortic valve area, by VTI measures 0.72 cm.  Aortic valve mean gradient measures 41.0 mmHg. Aortic valve Vmax measures  3.97 m/s.  5. The inferior vena cava is normal in size with greater than 50% respiratory variability, suggesting right atrial pressure of 3 mmHg.  Carotid artery duplex 04/12/2021: Doppler velocity suggests stenosis in the right internal carotid artery (50-69%). Doppler velocity suggests stenosis in the left internal carotid artery (16-49%). Antegrade right vertebral artery flow. Antegrade left vertebral artery flow. Follow up in six months is appropriate if clinically indicated.  Right and left heart catheterization 04/16/2021: Hemodynamic data: RA 2/1, mean 0 mmHg.  RV 24/-2, EDP 1 mmHg.  PA 24/6, mean 14 mmHg.  PW 9/4, mean 5 mmHg. PA saturation 78%, aortic saturation 100%.  Cardiac output by Fick was 5.16 with a cardiac index of 2.98. LV: 177/2, EDP 13 mmHg.  Aorta: 137/67, mean 95 mmHg. Peak to peak gradient 33 with a mean gradient of 29.4 mmHg.  Calculated aortic valve area 0.88 cm. Angiographic data: Left main: Mildly calcified with mild luminal irregularity. CX: Large vessel, giving origin to small marginals and continues in the AV groove.  Proximal CX has calcified 60 to 70% stenosis.  OM 3 is the largest vessel with a proximal 80% calcific stenosis.  OM 1 and 2 are very small. LAD: Large-caliber vessel, mild diffuse disease, mild calcification is evident in the proximal and mid segment.  D1 and D2 are moderate sized without significant disease. RCA: Large-caliber vessel with mild diffuse disease and mild coronary calcification. Rec: Patient has severe aortic stenosis by echocardiogram with a mean gradient >40 mmHg.  She now has severe aortic stenosis with symptomatic shortness of breath and aortic stenosis related angina pectoris at night, no exertional chest pain.  Although she has significant stenosis in the circumflex, do not think she needs intervention to the circumflex.  She will benefit from TAVR evaluation.  50 mL contrast utilized.   EKG:   EKG 05/09/2021: Normal sinus rhythm  at rate of 82 bpm, leftward axis, incomplete right bundle branch block.  Poor IV progression, probably normal variant.  No evidence of ischemia. No significant change from EKG 03/29/2021.   Assessment     ICD-10-CM   1. Severe aortic stenosis  I35.0 EKG 12-Lead    2. Dyspnea on exertion  R06.00     3. Asymptomatic bilateral carotid artery stenosis  I65.23     4. Mixed hyperlipidemia  E78.2 Lipid Panel With LDL/HDL Ratio       There are no discontinued medications.  No orders of the defined types were placed in this encounter.  Orders Placed This Encounter  Procedures   Lipid Panel With LDL/HDL Ratio    Standing Status:   Future    Standing Expiration Date:   05/09/2022   EKG 12-Lead  Recommendations:   Sydney Reid is a 80 y.o. Caucasian female patient with aortic stenosis, stage IIIa chronic kidney disease, hyperlipidemia, obstructive sleep apnea on CPAP, last evaluated by cardiology in 2017, recently underwent repeat echocardiogram on 02/19/2021 revealing severe aortic stenosis.  She is now referred to me for evaluation of valvular heart disease.  She was evaluated by Dr. Sherren Mocha for TAVR.  Patient has tentatively schedule this for sometime in September 2022 but is thinking about rescheduling to her later date.  Advised her to keep herself well-hydrated.  Blood pressure is soft.  Avoidance of hypotension discussed with the patient.  She is now tolerating low-dose of simvastatin at 10 mg daily.  Her LDL is not at goal.  She is willing to increase it to 20 mg dose and see how she does and I would like to repeat her lipid profile testing in 1 month from now.  Carotid artery reveals mild to moderate disease, will continue surveillance.  Otherwise stable from cardiac standpoint, I would like to see him back in 3 months for follow-up.   Adrian Prows, MD, [facility] 05/09/2021, 10:13 AM Office: [phone]

## 2021-05-10 ENCOUNTER — Ambulatory Visit: Payer: Medicare Other | Admitting: Cardiology

## 2021-05-15 DIAGNOSIS — M5459 Other low back pain: Secondary | ICD-10-CM | POA: Diagnosis not present

## 2021-05-15 DIAGNOSIS — M2550 Pain in unspecified joint: Secondary | ICD-10-CM | POA: Diagnosis not present

## 2021-05-15 DIAGNOSIS — Z79899 Other long term (current) drug therapy: Secondary | ICD-10-CM | POA: Diagnosis not present

## 2021-05-15 DIAGNOSIS — M1990 Unspecified osteoarthritis, unspecified site: Secondary | ICD-10-CM | POA: Diagnosis not present

## 2021-05-15 DIAGNOSIS — L4050 Arthropathic psoriasis, unspecified: Secondary | ICD-10-CM | POA: Diagnosis not present

## 2021-05-15 DIAGNOSIS — M8580 Other specified disorders of bone density and structure, unspecified site: Secondary | ICD-10-CM | POA: Diagnosis not present

## 2021-05-15 DIAGNOSIS — L409 Psoriasis, unspecified: Secondary | ICD-10-CM | POA: Diagnosis not present

## 2021-05-16 ENCOUNTER — Inpatient Hospital Stay: Payer: Medicare Other

## 2021-05-16 ENCOUNTER — Other Ambulatory Visit: Payer: Self-pay | Admitting: *Deleted

## 2021-05-16 ENCOUNTER — Other Ambulatory Visit: Payer: Self-pay

## 2021-05-16 ENCOUNTER — Inpatient Hospital Stay: Payer: Medicare Other | Attending: Internal Medicine | Admitting: Internal Medicine

## 2021-05-16 VITALS — BP 142/72 | HR 89 | Temp 98.2°F | Resp 19 | Ht 65.0 in | Wt 147.0 lb

## 2021-05-16 DIAGNOSIS — I350 Nonrheumatic aortic (valve) stenosis: Secondary | ICD-10-CM | POA: Insufficient documentation

## 2021-05-16 DIAGNOSIS — Z888 Allergy status to other drugs, medicaments and biological substances status: Secondary | ICD-10-CM | POA: Insufficient documentation

## 2021-05-16 DIAGNOSIS — D45 Polycythemia vera: Secondary | ICD-10-CM

## 2021-05-16 DIAGNOSIS — L4050 Arthropathic psoriasis, unspecified: Secondary | ICD-10-CM | POA: Insufficient documentation

## 2021-05-16 DIAGNOSIS — R0602 Shortness of breath: Secondary | ICD-10-CM | POA: Diagnosis not present

## 2021-05-16 DIAGNOSIS — E079 Disorder of thyroid, unspecified: Secondary | ICD-10-CM | POA: Insufficient documentation

## 2021-05-16 DIAGNOSIS — Z79899 Other long term (current) drug therapy: Secondary | ICD-10-CM | POA: Insufficient documentation

## 2021-05-16 DIAGNOSIS — R0609 Other forms of dyspnea: Secondary | ICD-10-CM | POA: Diagnosis not present

## 2021-05-16 DIAGNOSIS — Z885 Allergy status to narcotic agent status: Secondary | ICD-10-CM | POA: Insufficient documentation

## 2021-05-16 LAB — CBC WITH DIFFERENTIAL (CANCER CENTER ONLY)
Abs Immature Granulocytes: 0.02 10*3/uL (ref 0.00–0.07)
Basophils Absolute: 0 10*3/uL (ref 0.0–0.1)
Basophils Relative: 0 %
Eosinophils Absolute: 0 10*3/uL (ref 0.0–0.5)
Eosinophils Relative: 0 %
HCT: 45.2 % (ref 36.0–46.0)
Hemoglobin: 14.3 g/dL (ref 12.0–15.0)
Immature Granulocytes: 0 %
Lymphocytes Relative: 27 %
Lymphs Abs: 1.6 10*3/uL (ref 0.7–4.0)
MCH: 26.2 pg (ref 26.0–34.0)
MCHC: 31.6 g/dL (ref 30.0–36.0)
MCV: 82.9 fL (ref 80.0–100.0)
Monocytes Absolute: 0.7 10*3/uL (ref 0.1–1.0)
Monocytes Relative: 12 %
Neutro Abs: 3.4 10*3/uL (ref 1.7–7.7)
Neutrophils Relative %: 61 %
Platelet Count: 229 10*3/uL (ref 150–400)
RBC: 5.45 MIL/uL — ABNORMAL HIGH (ref 3.87–5.11)
RDW: 13.8 % (ref 11.5–15.5)
WBC Count: 5.7 10*3/uL (ref 4.0–10.5)
nRBC: 0 % (ref 0.0–0.2)

## 2021-05-16 NOTE — Progress Notes (Signed)
[place] [phone]   Fax:([phone]  OFFICE PROGRESS NOTE  [name], NP [place] [address]  DIAGNOSIS: Polycythemia vera with positive JAK2 mutation diagnosed in April 2022  PRIOR THERAPY: None  CURRENT THERAPY: Phlebotomy on as-needed basis  INTERVAL HISTORY: Sydney Reid 80 y.o. female returns to the clinic today for follow-up visit.  The patient is feeling fine today with no concerning complaints.  She was seen by her cardiologist Dr. Einar Gip and referred to Dr. Burt Knack because of symptomatic aortic stenosis and she is expected to have surgery by Dr. Cyndia Bent in few months.  She denied having any current chest pain but has shortness of breath with exertion with no cough or hemoptysis.  She denied having any fever or chills.  She has no nausea, vomiting, diarrhea or constipation.  She has no headache or visual changes.  She has no recent weight loss or night sweats.  The patient is here today for evaluation and repeat blood work.  MEDICAL HISTORY: Past Medical History:  Diagnosis Date   H/O total knee replacement 2001   Right   High cholesterol    History of tonsillectomy and adenoidectomy 1946   Psoriatic arthritis ([place]) 1990   Sleep apnea with use of continuous positive airway pressure (CPAP) 2004   Thyroid disease     ALLERGIES:  is allergic to codeine and statins.  MEDICATIONS:  Current Outpatient Medications  Medication Sig Dispense Refill   ammonium lactate (LAC-HYDRIN) 12 % lotion Apply 1 application topically as needed for dry skin.     aspirin EC 81 MG tablet Take 81 mg by mouth daily. Swallow whole.     Cholecalciferol 1.25 MG (50000 UT) capsule Take 50,000 Units by mouth daily. 10,000 units taking 5 days a week, no weekend.     etanercept (ENBREL) 50 MG/ML injection Inject 50 mg into the skin every Sunday.      ezetimibe (ZETIA) 10 MG tablet Take 1 tablet (10 mg total) by mouth daily. 90  tablet 1   HYDROcodone-acetaminophen (NORCO) 5-325 MG tablet Take 1 tablet by mouth at bedtime. 30 tablet 0   levothyroxine (SYNTHROID) 88 MCG tablet Take one tablet by mouth once daily 30 minutes before breakfast on an empty stomach for thyroid. 90 tablet 3   simvastatin (ZOCOR) 20 MG tablet Take 20 mg by mouth daily.     metoprolol tartrate (LOPRESSOR) 50 MG tablet Take 1 tablet (50 mg) 2 hours prior to your CT scan (at 8:45AM). Bring the second tablet with you but do not take unless instructed. (Patient not taking: No sig reported) 2 tablet 0   No current facility-administered medications for this visit.    SURGICAL HISTORY:  Past Surgical History:  Procedure Laterality Date   FOOT SURGERY     fused arch in left foot   RIGHT/LEFT HEART CATH AND CORONARY ANGIOGRAPHY N/A 04/16/2021   Procedure: RIGHT/LEFT HEART CATH AND CORONARY ANGIOGRAPHY;  Surgeon: Adrian Prows, MD;  Location: [facility];  Service: Cardiovascular;  Laterality: N/A;   SPINE SURGERY     TOTAL KNEE ARTHROPLASTY Left     REVIEW OF SYSTEMS:  A comprehensive review of systems was negative except for: Respiratory: positive for dyspnea on exertion   PHYSICAL EXAMINATION: General appearance: alert, cooperative, and no distress Head: Normocephalic, without obvious abnormality, atraumatic Neck: no adenopathy, no JVD, supple, symmetrical, trachea midline, and thyroid not enlarged, symmetric, no tenderness/mass/nodules Lymph nodes:  Cervical, supraclavicular, and axillary nodes normal. Resp: clear to auscultation bilaterally Back: symmetric, no curvature. ROM normal. No CVA tenderness. Cardio: regular rate and rhythm, S1, S2 normal, no murmur, click, rub or gallop GI: soft, non-tender; bowel sounds normal; no masses,  no organomegaly Extremities: extremities normal, atraumatic, no cyanosis or edema  ECOG PERFORMANCE STATUS: 1 - Symptomatic but completely ambulatory  Blood pressure (!) 142/72, pulse 89, temperature 98.2  F (36.8 C), temperature source Tympanic, resp. rate 19, height 5\' 5"  (1.651 m), weight 147 lb (66.7 kg), SpO2 99 %.  LABORATORY DATA: Lab Results  Component Value Date   WBC 5.7 05/16/2021   HGB 14.3 05/16/2021   HCT 45.2 05/16/2021   MCV 82.9 05/16/2021   PLT 229 05/16/2021      Chemistry      Component Value Date/Time   NA 143 04/16/2021 1606   NA 140 04/12/2021 1002   K 3.7 04/16/2021 1606   CL 103 04/12/2021 1002   CO2 22 04/12/2021 1002   BUN 17 04/12/2021 1002   CREATININE 1.10 (H) 04/12/2021 1002   CREATININE 1.05 (H) 03/14/2021 1141   CREATININE 1.21 (H) 03/06/2021 0856   GLU 93 04/02/2016 0000      Component Value Date/Time   CALCIUM 9.7 04/12/2021 1002   ALKPHOS 68 03/14/2021 1141   AST 21 03/14/2021 1141   ALT 13 03/14/2021 1141   BILITOT 0.5 03/14/2021 1141       RADIOGRAPHIC STUDIES: CARDIAC CATHETERIZATION  Result Date: 04/16/2021 Right and left heart catheterization 04/16/2021: Hemodynamic data: RA 2/1, mean 0 mmHg.  RV 24/-2, EDP 1 mmHg.  PA 24/6, mean 14 mmHg.  PW 9/4, mean 5 mmHg. PA saturation 78%, aortic saturation 100%.  Cardiac output by Fick was 5.16 with a cardiac index of 2.98. LV: 177/2, EDP 13 mmHg.  Aorta: 137/67, mean 95 mmHg. Peak to peak gradient 33 with a mean gradient of 29.4 mmHg.  Calculated aortic valve area 0.88 cm. Angiographic data: Left main: Mildly calcified with mild luminal irregularity. CX: Large vessel, giving origin to small marginals and continues in the AV groove.  Proximal CX has calcified 60 to 70% stenosis.  OM 3 is the largest vessel with a proximal 80% calcific stenosis.  OM 1 and 2 are very small. LAD: Large-caliber vessel, mild diffuse disease, mild calcification is evident in the proximal and mid segment.  D1 and D2 are moderate sized without significant disease. RCA: Large-caliber vessel with mild diffuse disease and mild coronary calcification. Rec: Patient has severe aortic stenosis by echocardiogram with a mean  gradient >40 mmHg.  She now has severe aortic stenosis with symptomatic shortness of breath and aortic stenosis related angina pectoris at night, no exertional chest pain.  Although she has significant stenosis in the circumflex, do not think she needs intervention to the circumflex.  She will benefit from TAVR evaluation.  50 mL contrast utilized.     ASSESSMENT AND PLAN: This is a very pleasant 80 years old white female recently diagnosed with polycythemia vera with positive JAK2 mutation. I discussed the lab results with the patient today. I recommended for her to proceed with phlebotomy to keep her hematocrit less than 45%. CBC today showed hematocrit of 45.2%. The patient is feeling fine today with no concerning complaints.  She does not need any phlebotomy today. I will see her back for follow-up visit in 3 months for evaluation and repeat CBC and phlebotomy if needed. The patient was advised to call immediately if  she has any other concerning symptoms in the interval. The patient voices understanding of current disease status and treatment options and is in agreement with the current care plan.  All questions were answered. The patient knows to call the clinic with any problems, questions or concerns. We can certainly see the patient much sooner if necessary.   Disclaimer: This note was dictated with voice recognition software. Similar sounding words can inadvertently be transcribed and may not be corrected upon review.

## 2021-05-22 ENCOUNTER — Telehealth: Payer: Self-pay | Admitting: Internal Medicine

## 2021-05-22 DIAGNOSIS — Z20822 Contact with and (suspected) exposure to covid-19: Secondary | ICD-10-CM | POA: Diagnosis not present

## 2021-05-22 NOTE — Telephone Encounter (Signed)
Sch per 7/25 los, pt aware.

## 2021-05-24 DIAGNOSIS — Z20822 Contact with and (suspected) exposure to covid-19: Secondary | ICD-10-CM | POA: Diagnosis not present

## 2021-05-26 DIAGNOSIS — Z20822 Contact with and (suspected) exposure to covid-19: Secondary | ICD-10-CM | POA: Diagnosis not present

## 2021-05-27 ENCOUNTER — Telehealth: Payer: Self-pay

## 2021-05-27 ENCOUNTER — Ambulatory Visit (INDEPENDENT_AMBULATORY_CARE_PROVIDER_SITE_OTHER): Payer: Medicare Other | Admitting: Nurse Practitioner

## 2021-05-27 ENCOUNTER — Telehealth: Payer: Self-pay | Admitting: Cardiology

## 2021-05-27 ENCOUNTER — Encounter: Payer: Self-pay | Admitting: Nurse Practitioner

## 2021-05-27 ENCOUNTER — Other Ambulatory Visit: Payer: Self-pay

## 2021-05-27 DIAGNOSIS — U071 COVID-19: Secondary | ICD-10-CM

## 2021-05-27 NOTE — Progress Notes (Signed)
   This service is provided via telemedicine  No vital signs collected/recorded due to the encounter was a telemedicine visit.   Location of patient (ex: home, work):  Home  Patient consents to a telephone visit: Yes, see telephone visit dated 05/27/21  Location of the provider (ex: office, home):  [facility], Office   Name of any referring provider:  N/A  Names of all persons participating in the telemedicine service and their role in the encounter:  S.Chrae B/CMA, Sherrie Mustache, NP, and Patient   Time spent on call:  11 min with medical assistant

## 2021-05-27 NOTE — Telephone Encounter (Signed)
Can go back to one a day

## 2021-05-27 NOTE — Patient Instructions (Addendum)
Mucinex DM by mouth twice daily with full glass of water.  Nasal wash daily, and nasal spray throughout the day Vit C 1000 mg twice daily, Vit d 5000 units daily and Zinc 50 mg by mouth daily for 1 week  Tylenol 650 mg by mouth three times daily as needed pain/fever/body aches

## 2021-05-27 NOTE — Progress Notes (Signed)
Careteam: Patient Care Team: [name], NP as PCP - General (Geriatric Medicine) Tomma Rakers, MD as Referring Physician (Ophthalmology)  Advanced Directive information    Allergies  Allergen Reactions   Codeine Nausea And Vomiting    Weakness and dysequilibrium   Statins Hives    Myalgias     Chief Complaint  Patient presents with   Acute Visit    Patient with low-grade fever 99.0-100.3, muscle aches, cough, off balance, chest/nasal congestion that onset on Saturday. Patients husband tested positive for covid on Tuesday(home-test). [facility] (was asymptomatic at the time). Patient went again yesterday at 11 am to be retested and has not recived results yet.      HPI: Patient is a 80 y.o. female due to COVID symptoms.  Her husband tested positive last week and now she has better. He was placed on molnupiravir by his PCP and she would like to start, She is agreeable to "rid out the symptoms"  Baseline shortness of breath due to AS. No worsening of this.  No chest pain.  Having cough, sinus congestion, runny nose.  99.5 temp this morning.  She has been having symptoms for 2 days.  Increase fatigue but still able to get up and get things done.   Review of Systems:  Review of Systems  Constitutional:  Positive for chills, fever and malaise/fatigue. Negative for weight loss.  HENT:  Positive for congestion. Negative for sore throat and tinnitus.   Respiratory:  Positive for cough and sputum production. Negative for shortness of breath.   Cardiovascular:  Negative for chest pain, palpitations and leg swelling.  Gastrointestinal:  Negative for abdominal pain, constipation, diarrhea and heartburn.  Genitourinary:  Negative for dysuria, frequency and urgency.  Musculoskeletal:  Negative for back pain, falls, joint pain and myalgias.  Skin: Negative.   Neurological:  Negative for dizziness and headaches.   Past Medical  History:  Diagnosis Date   H/O total knee replacement 2001   Right   High cholesterol    History of tonsillectomy and adenoidectomy 1946   Psoriatic arthritis ([place]) 1990   Sleep apnea with use of continuous positive airway pressure (CPAP) 2004   Thyroid disease    Past Surgical History:  Procedure Laterality Date   FOOT SURGERY     fused arch in left foot   RIGHT/LEFT HEART CATH AND CORONARY ANGIOGRAPHY N/A 04/16/2021   Procedure: RIGHT/LEFT HEART CATH AND CORONARY ANGIOGRAPHY;  Surgeon: Adrian Prows, MD;  Location: [facility];  Service: Cardiovascular;  Laterality: N/A;   SPINE SURGERY     TOTAL KNEE ARTHROPLASTY Left    Social History:   reports that she quit smoking about 47 years ago. Her smoking use included cigarettes. She has a 36.00 pack-year smoking history. She has never used smokeless tobacco. She reports current alcohol use of about 1.0 - 2.0 standard drink of alcohol per week. She reports that she does not use drugs.  Family History  Problem Relation Age of Onset   Heart attack Father    Heart disease Sister     Medications: Patient's Medications  New Prescriptions   No medications on file  Previous Medications   AMMONIUM LACTATE (LAC-HYDRIN) 12 % LOTION    Apply 1 application topically as needed for dry skin.   ASPIRIN EC 81 MG TABLET    Take 81 mg by mouth daily. Swallow whole.   CHOLECALCIFEROL (VITAMIN D3) 250 MCG (10000 UT)  CAPSULE    Take 10,000 Units by mouth daily. Except none on Saturday and Sunday   ETANERCEPT (ENBREL) 50 MG/ML INJECTION    Inject 50 mg into the skin every Sunday.    EZETIMIBE (ZETIA) 10 MG TABLET    Take 1 tablet (10 mg total) by mouth daily.   HYDROCODONE-ACETAMINOPHEN (NORCO) 5-325 MG TABLET    Take 1 tablet by mouth at bedtime.   LEVOTHYROXINE (SYNTHROID) 88 MCG TABLET    Take one tablet by mouth once daily 30 minutes before breakfast on an empty stomach for thyroid.   METOPROLOL TARTRATE (LOPRESSOR) 50 MG TABLET    Take 1  tablet (50 mg) 2 hours prior to your CT scan (at 8:45AM). Bring the second tablet with you but do not take unless instructed.   SIMVASTATIN (ZOCOR) 10 MG TABLET    Take 10 mg by mouth as directed. M,W,F take 20 mg and all other days 10 mg  Modified Medications   No medications on file  Discontinued Medications   CHOLECALCIFEROL 1.25 MG (50000 UT) CAPSULE    Take 50,000 Units by mouth daily. 10,000 units taking 5 days a week, no weekend.   SIMVASTATIN (ZOCOR) 20 MG TABLET    Take 20 mg by mouth daily.    Physical Exam:  There were no vitals filed for this visit. There is no height or weight on file to calculate BMI. Wt Readings from Last 3 Encounters:  05/16/21 147 lb (66.7 kg)  05/09/21 149 lb 6.4 oz (67.8 kg)  05/01/21 147 lb 3.2 oz (66.8 kg)     Labs reviewed: Basic Metabolic Panel: Recent Labs    01/21/21 1630 01/29/21 1125 03/06/21 0856 03/14/21 1141 04/12/21 1002 04/16/21 1557 04/16/21 1559 04/16/21 1606  NA 141   < > 141 140 140 144 145 143  K 4.4   < > 4.5 4.3 4.3 3.8 3.9 3.7  CL 108   < > 107 108 103  --   --   --   CO2 22   < > '24 24 22  '$ --   --   --   GLUCOSE 90   < > 90 95 101*  --   --   --   BUN 16   < > '15 17 17  '$ --   --   --   CREATININE 0.98*   < > 1.21* 1.05* 1.10*  --   --   --   CALCIUM 9.8   < > 9.5 9.5 9.7  --   --   --   TSH 0.58  --  1.11  --   --   --   --   --    < > = values in this interval not displayed.   Liver Function Tests: Recent Labs    01/29/21 1125 03/06/21 0856 03/14/21 1141  AST '24 17 21  '$ ALT '28 14 13  '$ ALKPHOS 71  --  68  BILITOT 0.9 0.5 0.5  PROT 8.3* 7.4 7.9  ALBUMIN 4.2  --  3.9   No results for input(s): LIPASE, AMYLASE in the last 8760 hours. No results for input(s): AMMONIA in the last 8760 hours. CBC: Recent Labs    03/06/21 0856 03/14/21 1141 04/12/21 1002 04/16/21 1557 04/16/21 1559 04/16/21 1606 05/16/21 1124  WBC 6.5 6.1 6.5  --   --   --  5.7  NEUTROABS 3,835 3.5  --   --   --   --  3.4  HGB  13.4  13.8 14.9   < > 14.3 13.3 14.3  HCT 43.3 42.8 47.9*   < > 42.0 39.0 45.2  MCV 83.9 82.9 83  --   --   --  82.9  PLT 319 252 278  --   --   --  229   < > = values in this interval not displayed.   Lipid Panel: Recent Labs    08/20/20 0952 03/06/21 0856  CHOL 232* 193  HDL 47* 55  LDLCALC 153* 114*  TRIG 186* 129  CHOLHDL 4.9 3.5   TSH: Recent Labs    01/21/21 1630 03/06/21 0856  TSH 0.58 1.11   A1C: No results found for: HGBA1C   Assessment/Plan  1. COVID-19 -discussed anti-viral, she would like to hold off at this time and continue symptoms management.  -make sure you are staying well hydrated -recommended to take Vit C 1000 mg twice daily, Vit D 5000 units daily and zinc 50 mg daily for 7 days -maintain proper hydration -continue tylenol 650 mg by mouth every 8 hours as needed fever/body aches.  -mucinex DM twice daily as needed for cough and chest congestion -do not sit in bed all day, sit up in chair and walk around as tolerated -nasal wash daily and nasal saline PRN.  -education provided on when to follow up and when to seek immediate medication attention through the ED.     Next appt: 3 month follow up for routine follow up  [place]. Dellanira Dillow, [facility] [phone]    Virtual Visit via telephone visit, unable to connect face to face due to no smart phone.   I connected with patient on 05/27/21 at  9:00 AM EDT by telephone and verified that I am speaking with the correct person using two identifiers.  Location: Patient: home Provider: [place]   I discussed the limitations, risks, security and privacy concerns of performing an evaluation and management service by telephone and the availability of in person appointments. I also discussed with the patient that there may be a patient responsible charge related to this service. The patient expressed understanding and agreed to proceed.   I discussed the assessment and  treatment plan with the patient. The patient was provided an opportunity to ask questions and all were answered. The patient agreed with the plan and demonstrated an understanding of the instructions.   The patient was advised to call back or seek an in-person evaluation if the symptoms worsen or if the condition fails to improve as anticipated.  I provided 20 minutes of non-face-to-face time during this encounter.  Carlos American. Harle Battiest [employer] printed and mailed

## 2021-05-27 NOTE — Telephone Encounter (Signed)
Ms. Sydney Reid, Sydney Reid are scheduled for a virtual visit with your provider today.    Just as we do with appointments in the office, we must obtain your consent to participate.  Your consent will be active for this visit and any virtual visit you may have with one of our providers in the next 365 days.    If you have a MyChart account, I can also send a copy of this consent to you electronically.  All virtual visits are billed to your insurance company just like a traditional visit in the office.  As this is a virtual visit, video technology does not allow for your provider to perform a traditional examination.  This may limit your provider's ability to fully assess your condition.  If your provider identifies any concerns that need to be evaluated in person or the need to arrange testing such as labs, EKG, etc, we will make arrangements to do so.    Although advances in technology are sophisticated, we cannot ensure that it will always work on either your end or our end.  If the connection with a video visit is poor, we may have to switch to a telephone visit.  With either a video or telephone visit, we are not always able to ensure that we have a secure connection.   I need to obtain your verbal consent now.   Are you willing to proceed with your visit today?   Sydney Reid has provided verbal consent on 05/27/2021 for a virtual visit (video or telephone).   Leigh Aurora [place], [place] 05/27/2021  8:35 AM

## 2021-05-27 NOTE — Telephone Encounter (Signed)
Pt cannot double up on simvastatin she is getting muscle aches

## 2021-05-28 ENCOUNTER — Other Ambulatory Visit: Payer: Self-pay | Admitting: Nurse Practitioner

## 2021-05-28 NOTE — Telephone Encounter (Signed)
Pt aware.

## 2021-05-28 NOTE — Telephone Encounter (Signed)
Patient called requesting refill to be sent to [facility].

## 2021-05-30 DIAGNOSIS — Z20822 Contact with and (suspected) exposure to covid-19: Secondary | ICD-10-CM | POA: Diagnosis not present

## 2021-05-31 ENCOUNTER — Other Ambulatory Visit: Payer: Self-pay

## 2021-05-31 DIAGNOSIS — Z20822 Contact with and (suspected) exposure to covid-19: Secondary | ICD-10-CM | POA: Diagnosis not present

## 2021-05-31 MED ORDER — SIMVASTATIN 10 MG PO TABS: 10.0000 mg | ORAL_TABLET | Freq: Every day | ORAL | 3 refills | Status: DC

## 2021-06-06 ENCOUNTER — Other Ambulatory Visit: Payer: Self-pay | Admitting: Cardiology

## 2021-06-06 DIAGNOSIS — E782 Mixed hyperlipidemia: Secondary | ICD-10-CM | POA: Diagnosis not present

## 2021-06-07 LAB — LIPID PANEL WITH LDL/HDL RATIO
Cholesterol, Total: 155 mg/dL (ref 100–199)
HDL: 48 mg/dL (ref >39)
LDL Chol Calc (NIH): 87 mg/dL (ref 0–99)
LDL/HDL Ratio: 1.8 ratio (ref 0.0–3.2)
Triglycerides: 107 mg/dL (ref 0–149)
VLDL Cholesterol Cal: 20 mg/dL (ref 5–40)

## 2021-06-07 NOTE — Progress Notes (Signed)
Increase Simvastatin to 20 mg in the evening and let me know if we need to sent new Rx

## 2021-06-10 DIAGNOSIS — Z20822 Contact with and (suspected) exposure to covid-19: Secondary | ICD-10-CM | POA: Diagnosis not present

## 2021-06-11 ENCOUNTER — Other Ambulatory Visit: Payer: Self-pay

## 2021-06-11 ENCOUNTER — Other Ambulatory Visit: Payer: Medicare Other | Admitting: *Deleted

## 2021-06-11 DIAGNOSIS — I350 Nonrheumatic aortic (valve) stenosis: Secondary | ICD-10-CM | POA: Diagnosis not present

## 2021-06-11 DIAGNOSIS — M21961 Unspecified acquired deformity of right lower leg: Secondary | ICD-10-CM | POA: Diagnosis not present

## 2021-06-11 DIAGNOSIS — L603 Nail dystrophy: Secondary | ICD-10-CM | POA: Diagnosis not present

## 2021-06-11 DIAGNOSIS — L84 Corns and callosities: Secondary | ICD-10-CM | POA: Diagnosis not present

## 2021-06-11 DIAGNOSIS — I70213 Atherosclerosis of native arteries of extremities with intermittent claudication, bilateral legs: Secondary | ICD-10-CM | POA: Diagnosis not present

## 2021-06-12 ENCOUNTER — Other Ambulatory Visit: Payer: Self-pay

## 2021-06-12 DIAGNOSIS — L4052 Psoriatic arthritis mutilans: Secondary | ICD-10-CM

## 2021-06-12 DIAGNOSIS — G894 Chronic pain syndrome: Secondary | ICD-10-CM

## 2021-06-12 LAB — BASIC METABOLIC PANEL
BUN/Creatinine Ratio: 16 (ref 12–28)
BUN: 15 mg/dL (ref 8–27)
CO2: 23 mmol/L (ref 20–29)
Calcium: 9.7 mg/dL (ref 8.7–10.3)
Chloride: 104 mmol/L (ref 96–106)
Creatinine, Ser: 0.91 mg/dL (ref 0.57–1.00)
Glucose: 98 mg/dL (ref 65–99)
Potassium: 4.7 mmol/L (ref 3.5–5.2)
Sodium: 142 mmol/L (ref 134–144)
eGFR: 63 mL/min/{1.73_m2} (ref >59)

## 2021-06-12 MED ORDER — EZETIMIBE 10 MG PO TABS: 10.0000 mg | ORAL_TABLET | Freq: Every day | ORAL | 0 refills | Status: DC

## 2021-06-12 MED ORDER — HYDROCODONE-ACETAMINOPHEN 5-325 MG PO TABS: 1.0000 | ORAL_TABLET | Freq: Every day | ORAL | 0 refills | Status: DC

## 2021-06-12 NOTE — Telephone Encounter (Signed)
Patient called for a refill request for her Zetia and Norco. I advised patient that the Zetia was send into the pharmacy on 05/28/2021 and she states that [facility] has not send her medication and she was going to contact them. She would like to know if a 2 week supple of the [facility] [address], [place] until she receive her medication from [facility].

## 2021-06-21 ENCOUNTER — Other Ambulatory Visit: Payer: Self-pay

## 2021-06-21 ENCOUNTER — Ambulatory Visit (HOSPITAL_COMMUNITY): Payer: Medicare Other

## 2021-06-21 ENCOUNTER — Ambulatory Visit (HOSPITAL_COMMUNITY)
Admission: RE | Admit: 2021-06-21 | Discharge: 2021-06-21 | Disposition: A | Discharge status: Home / Self Care | Payer: Medicare Other | Source: Ambulatory Visit | Attending: Cardiovascular Disease | Admitting: Cardiovascular Disease

## 2021-06-21 DIAGNOSIS — I350 Nonrheumatic aortic (valve) stenosis: Secondary | ICD-10-CM | POA: Diagnosis not present

## 2021-06-21 DIAGNOSIS — K5730 Diverticulosis of large intestine without perforation or abscess without bleeding: Secondary | ICD-10-CM | POA: Diagnosis not present

## 2021-06-21 DIAGNOSIS — K8020 Calculus of gallbladder without cholecystitis without obstruction: Secondary | ICD-10-CM | POA: Diagnosis not present

## 2021-06-21 DIAGNOSIS — K449 Diaphragmatic hernia without obstruction or gangrene: Secondary | ICD-10-CM | POA: Diagnosis not present

## 2021-06-21 IMAGING — CT CT ANGIO CHEST
2 of 7 series · 15 of 36 positions shown · IV contrast (omnipaque)
Comparison: No prior chest CT. CT of the abdomen and pelvis
[DATE].

CLINICAL DATA: 80-year-old female with history of severe aortic
stenosis. Preprocedural study prior to potential transcatheter
aortic valve replacement (TAVR) procedure.

EXAM:
CT ANGIOGRAPHY CHEST, ABDOMEN AND PELVIS
TECHNIQUE: Multidetector CT imaging through the chest, abdomen and pelvis was
performed using the standard protocol during bolus administration of
intravenous contrast. Multiplanar reconstructed images and MIPs were
obtained and reviewed to evaluate the vascular anatomy.
CONTRAST:  100mL OMNIPAQUE IOHEXOL 350 MG/ML SOLN

[Series 5: ax thins · axial · 0.59mm/px · z∈[+961,+1571]mm · 14 of 692 slices shown]
[im 41/692  lung]
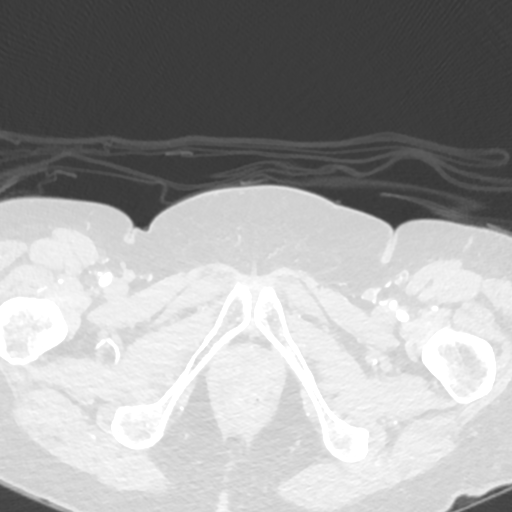
[im 82/692  mediastinal]
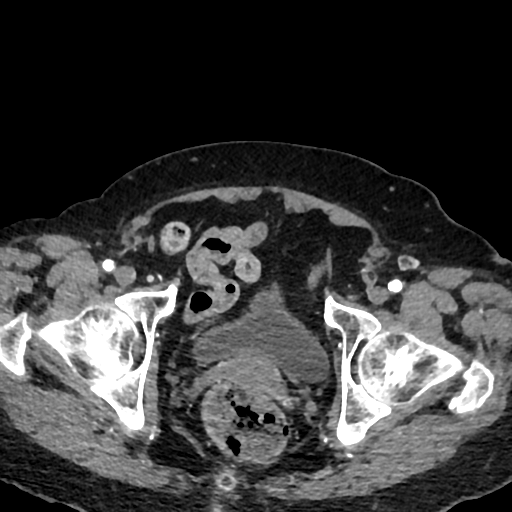
[im 122/692  lung]
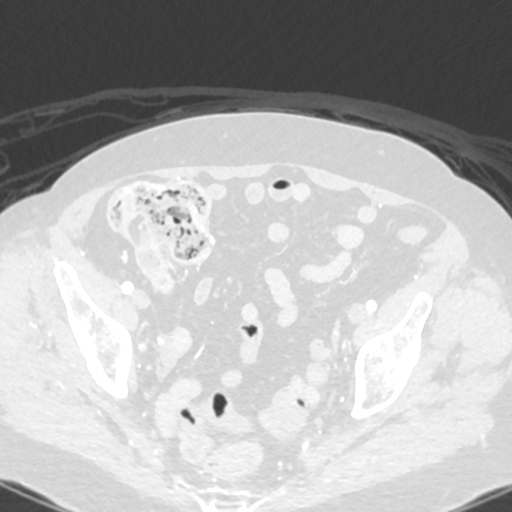
[im 204/692  mediastinal]
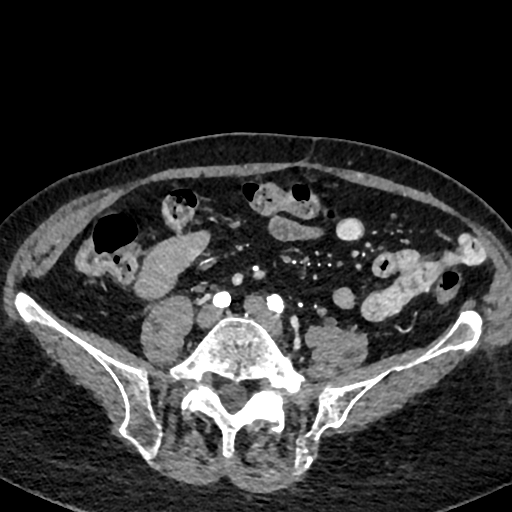
[im 244/692  lung]
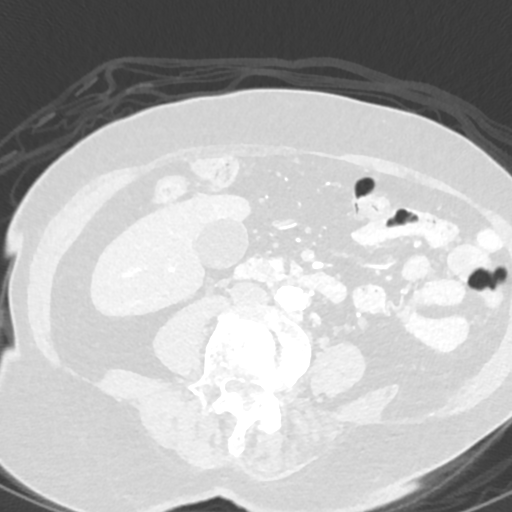
[im 285/692  mediastinal]
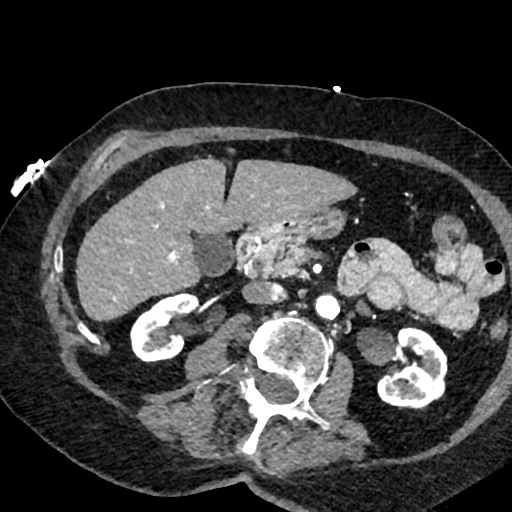
[im 326/692  lung]
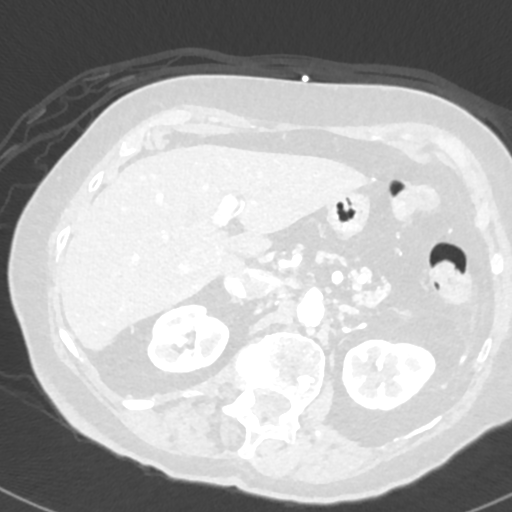
[im 366/692  mediastinal]
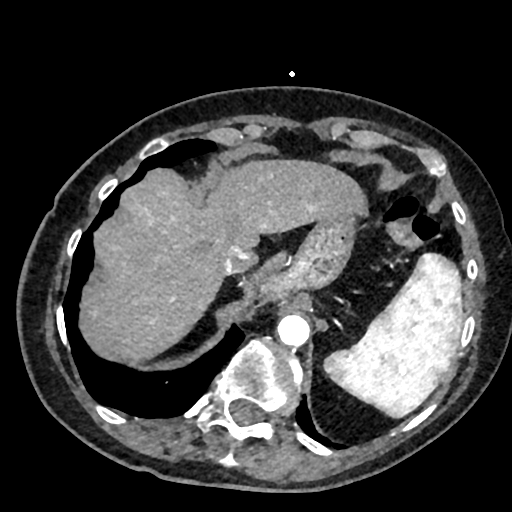
[im 407/692  lung]
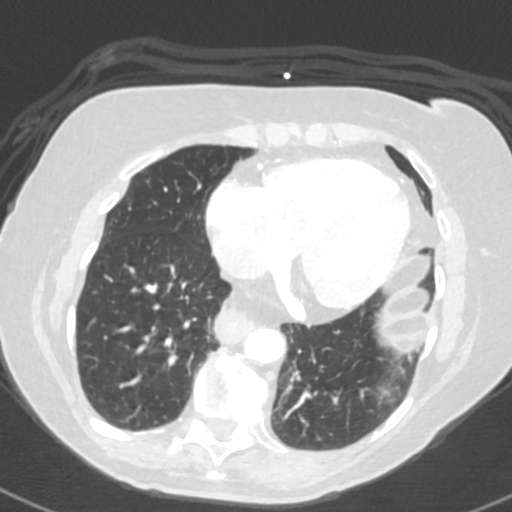
[im 448/692  mediastinal]
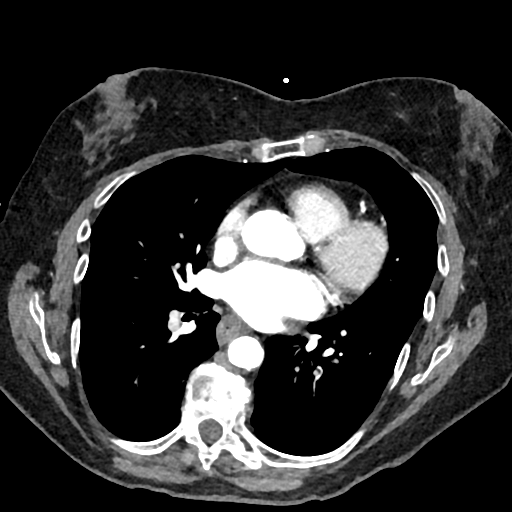
[im 529/692  lung]
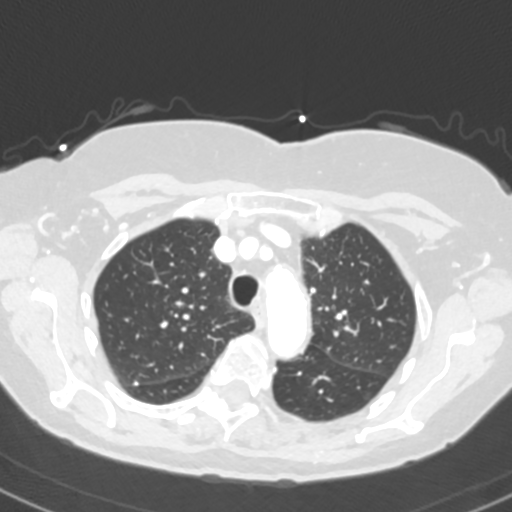
[im 570/692  mediastinal]
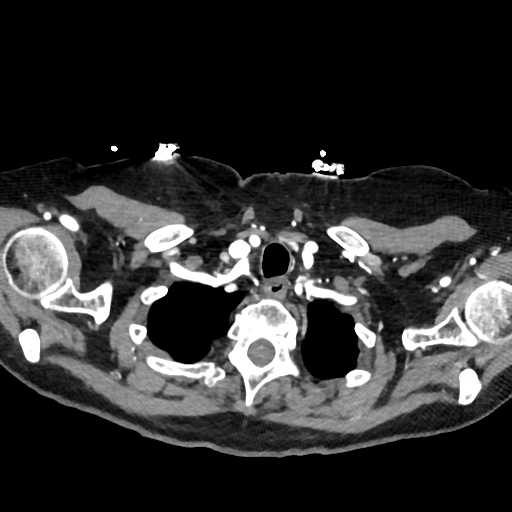
[im 610/692  lung]
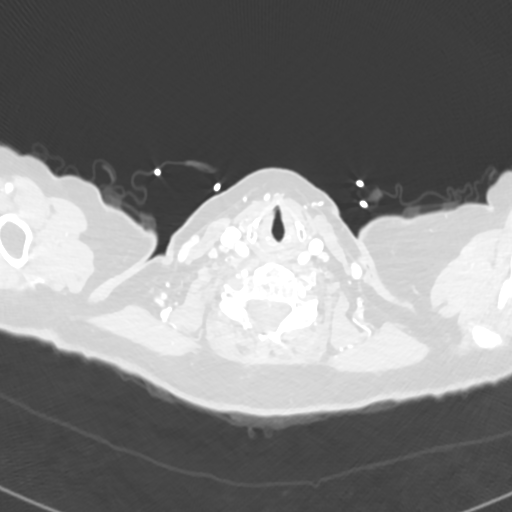
[im 651/692  mediastinal]
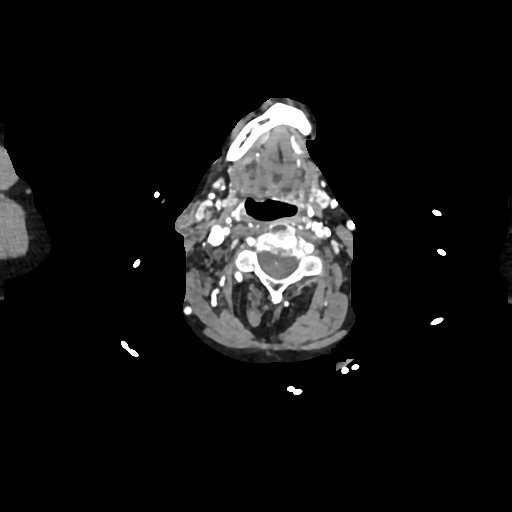

[Series 8: cor · coronal · 0.91mm/px · 1 of 151 slices shown]
[im 76/151  mediastinal]
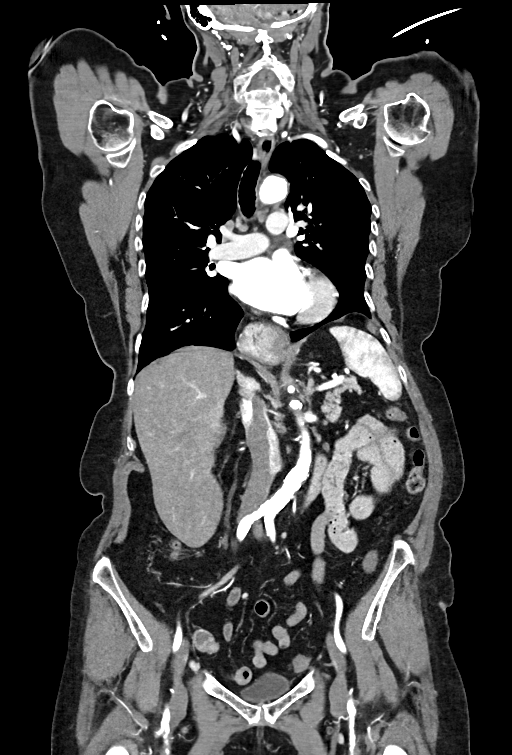

[15 of 36 positions shown; findings below may reference images not displayed]

FINDINGS: CTA CHEST FINDINGS

Cardiovascular: Heart size is normal. There is no significant
pericardial fluid, thickening or pericardial calcification. There is
aortic atherosclerosis, as well as atherosclerosis of the great
vessels of the mediastinum and the coronary arteries, including
calcified atherosclerotic plaque in the left main, left anterior
descending and right coronary arteries. Severe thickening and
calcification of the aortic valve.

Mediastinum/Lymph Nodes: No pathologically enlarged mediastinal or
hilar lymph nodes. Moderate to large hiatal hernia. Severe
thickening of the middle and distal thirds of the esophagus without
discrete esophageal mass. No axillary lymphadenopathy.

Lungs/Pleura: No suspicious appearing pulmonary nodules or masses
are noted. No acute consolidative airspace disease. No pleural
effusions.

Musculoskeletal/Soft Tissues: There are no aggressive appearing
lytic or blastic lesions noted in the visualized portions of the
skeleton.

CTA ABDOMEN AND PELVIS FINDINGS

Hepatobiliary: Diffuse low attenuation throughout the hepatic
parenchyma strongly suggestive of hepatic steatosis (difficult to
say for certain on today's contrast enhanced examination). No
discrete cystic or solid hepatic lesions. No intra or extrahepatic
biliary ductal dilatation. Large gallstone in the neck of the
gallbladder with faint peripheral and central calcifications
measuring 2.6 cm in diameter. Gallbladder is only moderately
distended. No gallbladder wall thickening or surrounding
pericholecystic fluid or inflammatory changes to suggest an acute
cholecystitis at this time.

Pancreas: No pancreatic mass. No pancreatic ductal dilatation. No
pancreatic or peripancreatic fluid collections or inflammatory
changes.

Spleen: Unremarkable.

Adrenals/Urinary Tract: Mild multifocal cortical thinning in the
kidneys bilaterally. No suspicious renal lesions. No
hydroureteronephrosis. Urinary bladder is nearly decompressed, but
otherwise unremarkable in appearance. Bilateral adrenal glands are
normal in appearance.

Stomach/Bowel: The appearance of the distal aspect of the stomach is
unremarkable. No pathologic dilatation of small bowel or colon.
Several colonic diverticulae are noted, particularly in the sigmoid
colon, without surrounding inflammatory changes to suggest an acute
diverticulitis at this time. Normal appendix.

Vascular/Lymphatic: Aortic atherosclerosis, with vascular findings
and measurements pertinent to potential TAVR procedure, as detailed
below. No aneurysm or dissection noted in the abdominal or pelvic
vasculature. No lymphadenopathy noted in the abdomen or pelvis.

Reproductive: Several small coarse calcifications in the uterus,
likely tiny calcified fibroids. Uterus and ovaries are otherwise
unremarkable in appearance.

Other: No significant volume of ascites.  No pneumoperitoneum.

Musculoskeletal: There are no aggressive appearing lytic or blastic
lesions noted in the visualized portions of the skeleton.

VASCULAR MEASUREMENTS PERTINENT TO TAVR:

AORTA:

Minimal Aortic [T3] x 0.9 cm mm

Severity of Aortic Calcification-severe

RIGHT PELVIS:

Right Common Iliac Artery -

Minimal [T3] x 4.2 mm

Tortuosity-mild

Calcification-moderate to severe

Right External Iliac Artery -

Minimal [T3] x 5.2 mm

Tortuosity-mild

Calcification-mild

Right Common Femoral Artery -

Minimal [T3] x 5.5 mm

Tortuosity-mild

Calcification-mild

LEFT PELVIS:

Left Common Iliac Artery -

Minimal [T3] x 5.4 mm

Tortuosity-mild

Calcification-moderate to severe

Left External Iliac Artery -

Minimal [T3] x 5.4 mm

Tortuosity-mild-to-moderate

Calcification-mild

Left Common Femoral Artery -

Minimal [T3] x 4.9 mm

Tortuosity-mild

Calcification-mild

Review of the MIP images confirms the above findings.
IMPRESSION: 1. Vascular findings and measurements pertinent to potential TAVR
procedure, as detailed above.
2. Severe thickening and calcification of the aortic valve,
compatible with reported clinical history of severe aortic stenosis.
3. Moderate to large hiatal hernia with severe thickening of the
middle to distal thirds of the esophagus without discrete esophageal
mass. This may simply reflect chronic reflux esophagitis, however,
further evaluation with endoscopy should be considered if there is
any clinical concern for esophageal metaplasia or neoplasia.
4. Hepatic steatosis.
5. Cholelithiasis without evidence of acute cholecystitis at this
time.
6. Colonic diverticulosis without evidence of acute diverticulitis
at this time.
7. Additional incidental findings, as above.

## 2021-06-21 IMAGING — CT CT HEART MORP W/ CTA COR W/ SCORE W/ CA W/CM &/OR W/O CM
2 of 7 series · 11 of 20 positions shown, 13 images · non-contrast
Comparison: None.
COMPARISON: None.

Addendum:
EXAM:
OVER-READ INTERPRETATION  CT CHEST

The following report is an over-read performed by radiologist Dr.
GAJJAR [REDACTED] on [DATE]. This
over-read does not include interpretation of cardiac or coronary
anatomy or pathology. The coronary calcium score/coronary CTA
interpretation by the cardiologist is attached.
CLINICAL DATA: Aortic stenosis
Cardiac TAVR CT
TECHNIQUE: The patient was scanned on a Siemens Force [REDACTED]ice scanner. A 120
kV retrospective scan was triggered in the descending thoracic aorta
at 111 HU's. Gantry rotation speed was 270 msecs and collimation was
.9 mm. No beta blockade or nitro were given. The 3D data set was
reconstructed in 5% intervals of the R-R cycle. Systolic and
diastolic phases were analyzed on a dedicated work station using
MPR, MIP and VRT modes. The patient received 80 cc of contrast.

[Series 6: 0-90% · axial · 0.39mm/px · z∈[+1325,+1451]mm · 5 of 3150 slices shown]
[im 525/3150  vessel]
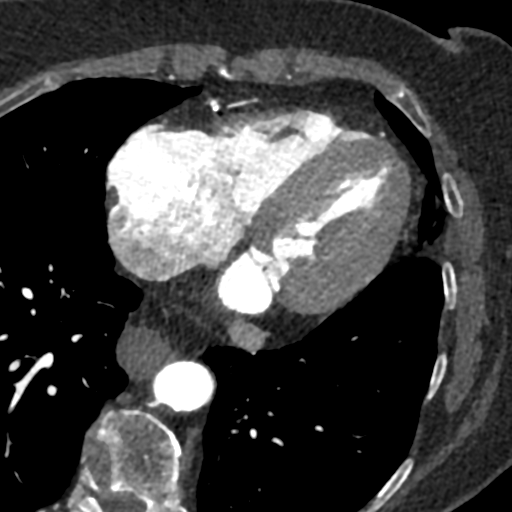
[im 1050/3150  vessel]
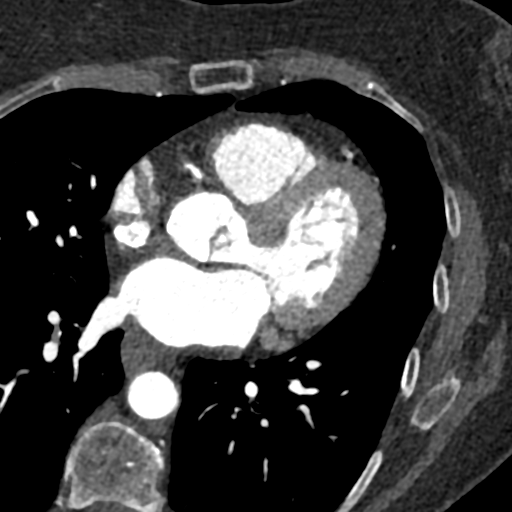
[im 1575/3150  vessel]
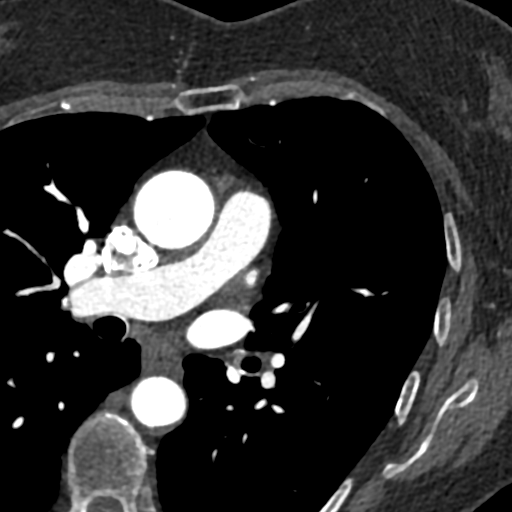
[im 2100/3150  vessel]
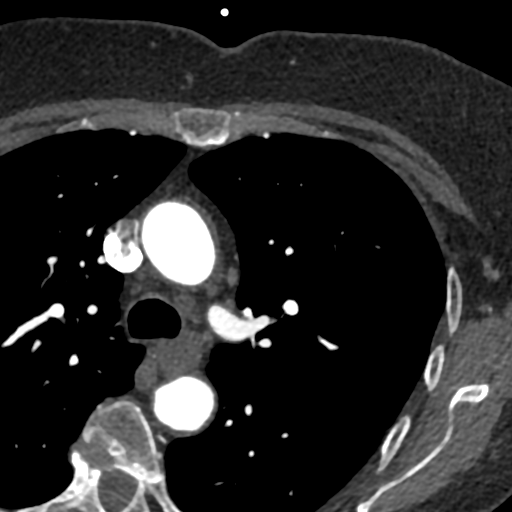
[im 2625/3150  vessel]
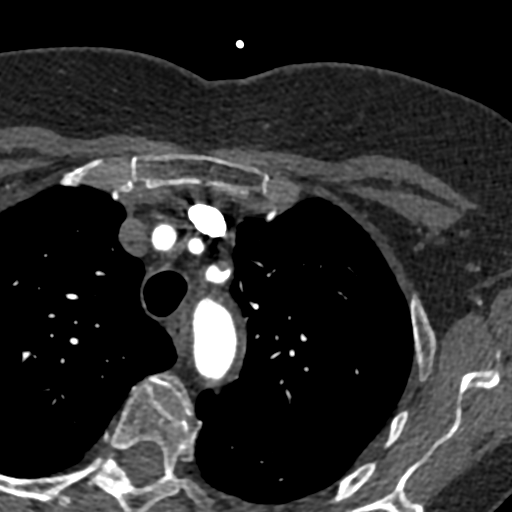

[Series 7: 5-95% · axial · 0.39mm/px · z∈[+1320,+1455]mm · 6 of 3150 slices shown, 8 images]
[im 450/3150  vessel]
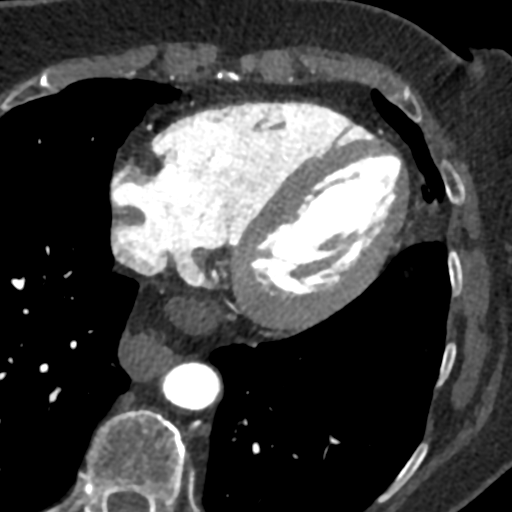
[im 450/3150  lung]
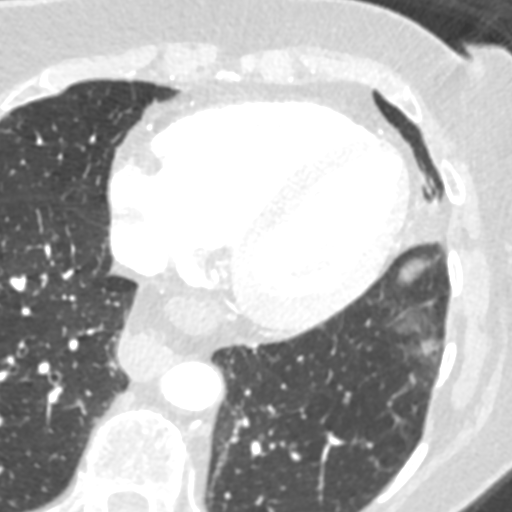
[im 900/3150  vessel]
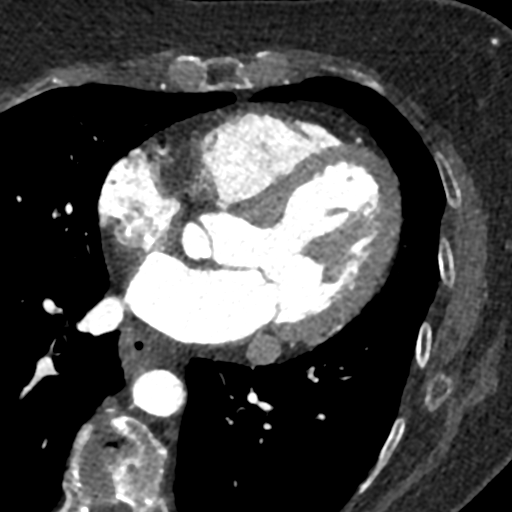
[im 1350/3150  vessel]
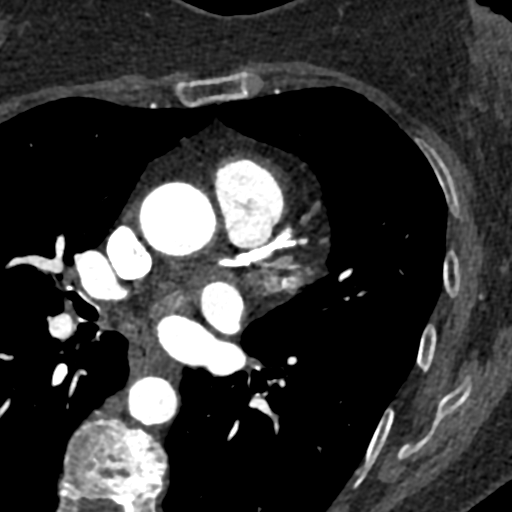
[im 1800/3150  vessel]
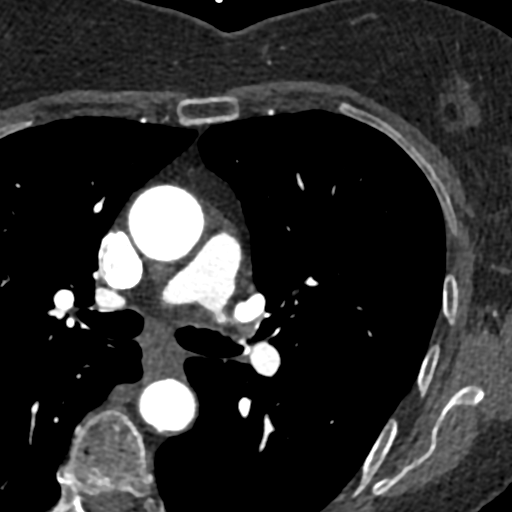
[im 2250/3150  vessel]
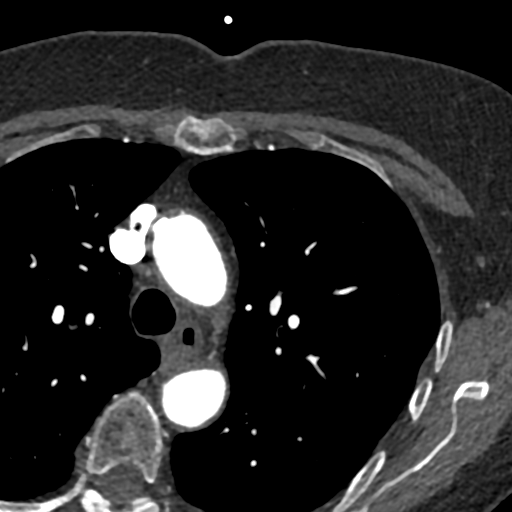
[im 2250/3150  lung]
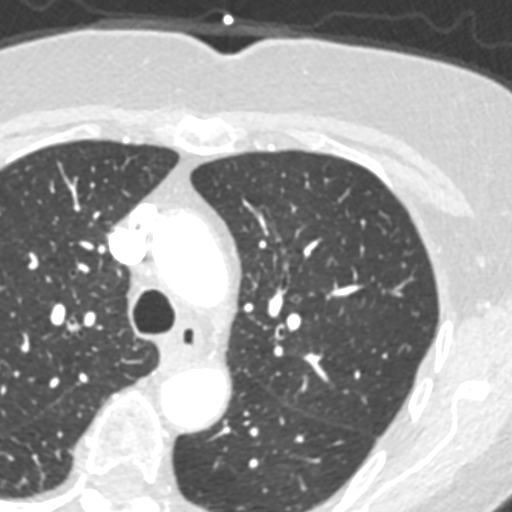
[im 2700/3150  vessel]
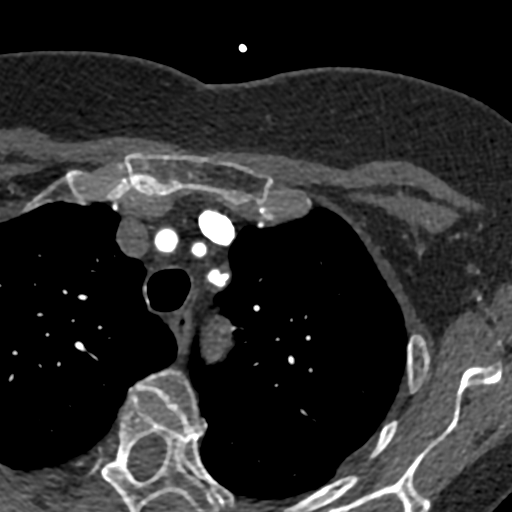

[11 of 20 positions shown; findings below may reference images not displayed]

FINDINGS: Extracardiac findings will be described separately under dictation
for contemporaneously obtained CTA chest, abdomen and pelvis.
IMPRESSION: Please see separate dictation for contemporaneously obtained CTA
chest, abdomen and pelvis dated [DATE] for full description of
relevant extracardiac findings.
FINDINGS: Aortic Valve: Tri leaflet calcified with restricted leaflet motion
Score -> [3X]

Aorta: Moderate calcified atherosclerosis with bovine arch and no
aneurysm

Sinotubular Junction: 23 mm

Ascending Thoracic Aorta: 30 mm

Aortic Arch: 24 mm

Descending Thoracic Aorta: 21 mm

Sinus of Valsalva Measurements:

Non-coronary: 27.9 mm

Right - coronary: 25.7 mm

Left - coronary: 25.3 mm

Coronary Artery Height above Annulus:

Left Main: 12.6 mm above annulus

Right Coronary: 13.74 mm above annulus

Virtual Basal Annulus Measurements:

Maximum/Minimum Diameter: 25.44 x 21.22 mm

Perimeter: 73 mm

Area: 415 mm 2

Coronary Arteries: Sufficient height above annulus for deployment

Optimum Fluoroscopic Angle for Delivery: LAO 19 Caudal 13 degrees
IMPRESSION: 1 Tri leaflet AV with calcium score [3X] and annular area of 415 mm
2 suitable for a 23 mm Sapien 3 valve

2.  Normal aortic root 3.0 cm moderate calcific atherosclerosis

3.  Coronary arteries sufficient height above annulus for deployment

4. Optimum angiographic angle for deployment LAO 19 Caudal 13
degrees

GAJJAR

*** End of Addendum ***
EXAM:
OVER-READ INTERPRETATION  CT CHEST

The following report is an over-read performed by radiologist Dr.
GAJJAR [REDACTED] on [DATE]. This
over-read does not include interpretation of cardiac or coronary
anatomy or pathology. The coronary calcium score/coronary CTA
interpretation by the cardiologist is attached.
FINDINGS: Extracardiac findings will be described separately under dictation
for contemporaneously obtained CTA chest, abdomen and pelvis.
IMPRESSION: Please see separate dictation for contemporaneously obtained CTA
chest, abdomen and pelvis dated [DATE] for full description of
relevant extracardiac findings.

## 2021-06-21 MED ORDER — IOHEXOL 350 MG/ML SOLN
100.0000 mL | Freq: Once | INTRAVENOUS | Status: AC | PRN
  Administered 2021-06-21: 100 mL via INTRAVENOUS

## 2021-06-25 ENCOUNTER — Other Ambulatory Visit: Payer: Self-pay | Admitting: Cardiology

## 2021-06-25 DIAGNOSIS — E782 Mixed hyperlipidemia: Secondary | ICD-10-CM

## 2021-07-10 DIAGNOSIS — Z20822 Contact with and (suspected) exposure to covid-19: Secondary | ICD-10-CM | POA: Diagnosis not present

## 2021-07-11 ENCOUNTER — Other Ambulatory Visit: Payer: Self-pay | Admitting: *Deleted

## 2021-07-11 DIAGNOSIS — L4052 Psoriatic arthritis mutilans: Secondary | ICD-10-CM

## 2021-07-11 DIAGNOSIS — G894 Chronic pain syndrome: Secondary | ICD-10-CM

## 2021-07-11 MED ORDER — HYDROCODONE-ACETAMINOPHEN 5-325 MG PO TABS: 1.0000 | ORAL_TABLET | Freq: Every day | ORAL | 0 refills | Status: DC

## 2021-07-11 NOTE — Telephone Encounter (Signed)
Patient called and requested refill on pain medication.  Epic LR: 06/12/2021 Contract on File.  Patient stated that she had to take an extra pill due to a fall.  Pended Rx and sent to [facility] for approval.

## 2021-07-25 ENCOUNTER — Encounter: Payer: Self-pay | Admitting: Surgery

## 2021-07-25 ENCOUNTER — Institutional Professional Consult (permissible substitution) (INDEPENDENT_AMBULATORY_CARE_PROVIDER_SITE_OTHER): Payer: Medicare Other | Admitting: Surgery

## 2021-07-25 ENCOUNTER — Other Ambulatory Visit: Payer: Self-pay

## 2021-07-25 VITALS — BP 143/84 | HR 92 | Resp 20 | Ht 65.0 in | Wt 143.0 lb

## 2021-07-25 DIAGNOSIS — I350 Nonrheumatic aortic (valve) stenosis: Secondary | ICD-10-CM | POA: Diagnosis not present

## 2021-07-25 DIAGNOSIS — I6523 Occlusion and stenosis of bilateral carotid arteries: Secondary | ICD-10-CM | POA: Diagnosis not present

## 2021-07-26 ENCOUNTER — Telehealth: Payer: Self-pay | Admitting: Internal Medicine

## 2021-07-26 NOTE — Telephone Encounter (Signed)
Per 9/30 schedule msg, appt was rescheduled and pt has been called and confirmed appt.

## 2021-07-28 NOTE — Progress Notes (Signed)
Patient ID: Sydney Reid, female   DOB: 1941-07-20, 80 y.o.   MRN: [id number]  [facility]         [address]       [place] [address]             [phone]          CARDIOTHORACIC SURGERY CONSULTATION REPORT  PCP is [name], NP Referring Provider is Sherren Mocha, MD Primary Cardiologist is Adrian Prows, MD  Reason for consultation: Severe aortic stenosis  HPI:  The patient is an 80 year old woman with a long history of psoriatic arthritis currently on Enbrel, OSA on CPAP, hypercholesteremia, and severe aortic stenosis who was referred by Dr. Einar Gip for consideration of aortic valve replacement.  She has remained relatively active considering her arthritis but reports progressive exertional shortness of breath and fatigue.  An echocardiogram in 2017 showed mild aortic stenosis with a mean gradient of 11 mmHg.  Her most recent echo on 02/19/2021 showed a mean gradient of 41 mmHg with a valve area of 0.72 cm by VTI.  Left ventricular ejection fraction was 60 to 65% with grade 1 diastolic dysfunction.  There is no mitral stenosis or regurgitation. Cardiac catheterization showed moderate stenosis of the left circumflex with 60 to 70% proximal stenosis and an 80% calcific stenosis of a third marginal branch.  The LAD had mild nonobstructive disease.  The RCA had mild calcific plaque but no stenosis.  The mean gradient at cath was 29 mmHg with a peak to peak gradient of 33 mmHg.  Aortic valve area was calculated at 0.88 cm.  Right heart pressures and cardiac index were normal.  She is here today with her husband.  She reports a 86-month history of progressive exertional fatigue and shortness of breath.  This occurs with a moderate degree of exertion but she is still able to take care of herself and her house without symptoms.  She denies chest discomfort or tightness.  She denies orthopnea and PND.  She has  had some dizziness but no syncope.  Past Medical History:  Diagnosis Date   H/O total knee replacement 2001   Right   High cholesterol    History of tonsillectomy and adenoidectomy 1946   Psoriatic arthritis ([place]) 1990   Sleep apnea with use of continuous positive airway pressure (CPAP) 2004   Thyroid disease     Past Surgical History:  Procedure Laterality Date   FOOT SURGERY     fused arch in left foot   RIGHT/LEFT HEART CATH AND CORONARY ANGIOGRAPHY N/A 04/16/2021   Procedure: RIGHT/LEFT HEART CATH AND CORONARY ANGIOGRAPHY;  Surgeon: Adrian Prows, MD;  Location: [facility];  Service: Cardiovascular;  Laterality: N/A;   SPINE SURGERY     TOTAL KNEE ARTHROPLASTY Left     Family History  Problem Relation Age of Onset   Heart attack Father    Heart disease Sister     Social History   Socioeconomic History   Marital status: Married    Spouse name: Not on file   Number of children: Not on file   Years of education: Not on file   Highest education level: Not on file  Occupational History   Not on file  Tobacco Use   Smoking status: Former    Packs/day: 2.00    Years: 18.00    Pack years: 36.00    Types: Cigarettes  Quit date: 51    Years since quitting: 47.7   Smokeless tobacco: Never  Vaping Use   Vaping Use: Never used  Substance and Sexual Activity   Alcohol use: Yes    Alcohol/week: 1.0 - 2.0 standard drink    Types: 1 - 2 Standard drinks or equivalent per week    Comment: Social drinker, couple times a week   Drug use: No   Sexual activity: Not on file  Other Topics Concern   Not on file  Social History Narrative   Diet? Normal      Do you drink/eat things with caffeine? Yes, 1 mt. Dew per day      Marital status?              m                      What year were you married? 1988      Do you live in a house, apartment, assisted living, condo, trailer, etc.? Townhouse      Is it one or more stories? 2 story      How many persons live in  your home? 2      Do you have any pets in your home? (please list) no      Current or past profession: Is manager      Do you exercise?     No (did it for 50 years)                       Type & how often?      Do you have a living will? yes      Do you have a DNR form?        yes                          If not, do you want to discuss one?      Do you have signed POA/HPOA for forms?       Social Determinants of Health   Financial Resource Strain: Not on file  Food Insecurity: Not on file  Transportation Needs: Not on file  Physical Activity: Not on file  Stress: Not on file  Social Connections: Not on file  Intimate Partner Violence: Not on file    Prior to Admission medications   Medication Sig Start Date End Date Taking? Authorizing Provider  ammonium lactate (LAC-HYDRIN) 12 % lotion Apply 1 application topically as needed for dry skin. 01/08/21  Yes [provider]  aspirin EC 81 MG tablet Take 81 mg by mouth daily. Swallow whole.   Yes [provider]  Cholecalciferol (VITAMIN D3) 250 MCG (10000 UT) capsule Take 10,000 Units by mouth daily. Except none on Saturday and Sunday   Yes [provider]  etanercept (ENBREL) 50 MG/ML injection Inject 50 mg into the skin every Sunday.    Yes [provider]  ezetimibe (ZETIA) 10 MG tablet Take 1 tablet (10 mg total) by mouth daily. 06/12/21  Yes [name], NP  HYDROcodone-acetaminophen (NORCO) 5-325 MG tablet Take 1 tablet by mouth at bedtime. 07/11/21  Yes [name], NP  levothyroxine (SYNTHROID) 88 MCG tablet Take one tablet by mouth once daily 30 minutes before breakfast on an empty stomach for thyroid. 02/26/21  Yes [name], NP  metoprolol tartrate (LOPRESSOR) 50 MG tablet Take 1 tablet (50 mg) 2 hours  prior to your CT scan (at 8:45AM). Bring the second tablet with you but do not take unless instructed. 05/01/21  Yes Sherren Mocha, MD  simvastatin (ZOCOR) 10 MG tablet TAKE  1 TABLET BY MOUTH EVERYDAY AT BEDTIME 06/26/21  Yes Adrian Prows, MD    Current Outpatient Medications  Medication Sig Dispense Refill   ammonium lactate (LAC-HYDRIN) 12 % lotion Apply 1 application topically as needed for dry skin.     aspirin EC 81 MG tablet Take 81 mg by mouth daily. Swallow whole.     Cholecalciferol (VITAMIN D3) 250 MCG (10000 UT) capsule Take 10,000 Units by mouth daily. Except none on Saturday and Sunday     etanercept (ENBREL) 50 MG/ML injection Inject 50 mg into the skin every Sunday.      ezetimibe (ZETIA) 10 MG tablet Take 1 tablet (10 mg total) by mouth daily. 30 tablet 0   HYDROcodone-acetaminophen (NORCO) 5-325 MG tablet Take 1 tablet by mouth at bedtime. 30 tablet 0   levothyroxine (SYNTHROID) 88 MCG tablet Take one tablet by mouth once daily 30 minutes before breakfast on an empty stomach for thyroid. 90 tablet 3   metoprolol tartrate (LOPRESSOR) 50 MG tablet Take 1 tablet (50 mg) 2 hours prior to your CT scan (at 8:45AM). Bring the second tablet with you but do not take unless instructed. 2 tablet 0   simvastatin (ZOCOR) 10 MG tablet TAKE 1 TABLET BY MOUTH EVERYDAY AT BEDTIME 90 tablet 3   No current facility-administered medications for this visit.    Allergies  Allergen Reactions   Codeine Nausea And Vomiting    Weakness and dysequilibrium   Statins Hives    Myalgias       Review of Systems:   General:  normal appetite, + decreased energy, no weight gain, no weight loss, no fever  Cardiac:  No chest pain with exertion, no chest pain at rest, +SOB with moderate exertion, no resting SOB, no PND, no orthopnea, no palpitations, + arrhythmia, no atrial fibrillation, no LE edema, + dizzy spells, no syncope  Respiratory:  + exertional shortness of breath, no home oxygen, no productive cough, no dry cough, no bronchitis, no wheezing, no hemoptysis, no asthma, no pain with inspiration or cough, + sleep apnea, + CPAP at night  GI:   no difficulty swallowing,  no reflux, no frequent heartburn, no hiatal hernia, no abdominal pain, no constipation, no diarrhea, no hematochezia, no hematemesis, no melena  GU:   no dysuria,  no frequency, no urinary tract infection, no hematuria,no kidney stones, no kidney disease  Vascular:  no pain suggestive of claudication, no pain in feet, + leg cramps, no varicose veins, no DVT, no non-healing foot ulcer  Neuro:   no stroke, no TIA's, no seizures, no headaches, no temporary blindness one eye,  no slurred speech, no peripheral neuropathy, no chronic pain, no instability of gait, no memory/cognitive dysfunction  Musculoskeletal: + arthritis - primarily involving the spine and hands, no joint swelling, no myalgias, no difficulty walking, normal mobility   Skin:   no rash, no itching, no skin infections, no pressure sores or ulcerations  Psych:   no anxiety, no depression, no nervousness, no unusual recent stress  Eyes:   no blurry vision, + floaters, no recent vision changes, + wears glasses   ENT:   no hearing loss, no loose or painful teeth, no dentures, last saw dentist recently  Hematologic:  no easy bruising, no abnormal bleeding, no clotting disorder, no frequent  epistaxis  Endocrine:  no diabetes, does not check CBG's at home     Physical Exam:   BP (!) 143/84   Pulse 92   Resp 20   Ht 5\' 5"  (1.651 m)   Wt 143 lb (64.9 kg)   BMI 23.80 kg/m   General:  Elderly but  well-appearing  HEENT:  Unremarkable, NCAT, PERLA, EOMI  Neck:   no JVD, no bruits, no adenopathy   Chest:   clear to auscultation, symmetrical breath sounds, no wheezes, no rhonchi   CV:   RRR, 3/6 systolic murmur RSB, no diastolic murmur  Abdomen:  soft, non-tender, no masses   Extremities:  warm, well-perfused, pulses palpable at ankles, no lower extremity edema  Rectal/GU  Deferred  Neuro:   Grossly non-focal and symmetrical throughout  Skin:   Clean and dry, no rashes, no breakdown  Diagnostic Tests:  ECHOCARDIOGRAM REPORT          Patient Name:   MAGNOLIA MATTILA Date of Exam: 02/19/2021  Medical Rec #:  [id number]          Height:       65.0 in  Accession #:    [id number]         Weight:       150.2 lb  Date of Birth:  May 03, 1941           BSA:          1.751 m  Patient Age:    64 years           BP:           128/80 mmHg  Patient Gender: F                  HR:           88 bpm.  Exam Location:  [address]   Procedure: 2D Echo, Cardiac Doppler and Color Doppler   Indications:    R01.1 Murmur     History:        Patient has prior history of Echocardiogram examinations,  most                  recent 05/20/2016. Risk Factors:Dyslipidemia. Sleep apnea.                  Thyroid disease.     Sonographer:    Cresenciano Lick RDCS  Referring Phys: [place]     1. Left ventricular ejection fraction, by estimation, is 60 to 65%. The  left ventricle has normal function. The left ventricle has no regional  wall motion abnormalities. There is mild left ventricular hypertrophy of  the basal-septal segment. Left  ventricular diastolic parameters are consistent with Grade I diastolic  dysfunction (impaired relaxation).   2. Right ventricular systolic function is normal. The right ventricular  size is normal. There is normal pulmonary artery systolic pressure. The  estimated right ventricular systolic pressure is 19.6 mmHg.   3. The mitral valve is normal in structure. No evidence of mitral valve  regurgitation. No evidence of mitral stenosis.   4. The aortic valve is calcified. There is severe calcifcation of the  aortic valve. There is severe thickening of the aortic valve. Aortic valve  regurgitation is not visualized. Severe aortic valve stenosis. Aortic  valve area, by VTI measures 0.72 cm.  Aortic valve mean gradient measures 41.0 mmHg. Aortic valve Vmax measures  3.97 m/s.   5. The  inferior vena cava is normal in size with greater than 50%  respiratory variability,  suggesting right atrial pressure of 3 mmHg.   FINDINGS   Left Ventricle: Left ventricular ejection fraction, by estimation, is 60  to 65%. The left ventricle has normal function. The left ventricle has no  regional wall motion abnormalities. The left ventricular internal cavity  size was normal in size. There is   mild left ventricular hypertrophy of the basal-septal segment. Left  ventricular diastolic parameters are consistent with Grade I diastolic  dysfunction (impaired relaxation). Normal left ventricular filling  pressure.   Right Ventricle: The right ventricular size is normal. No increase in  right ventricular wall thickness. Right ventricular systolic function is  normal. There is normal pulmonary artery systolic pressure. The tricuspid  regurgitant velocity is 2.52 m/s, and   with an assumed right atrial pressure of 3 mmHg, the estimated right  ventricular systolic pressure is 76.1 mmHg.   Left Atrium: Left atrial size was normal in size.   Right Atrium: Right atrial size was normal in size.   Pericardium: There is no evidence of pericardial effusion.   Mitral Valve: The mitral valve is normal in structure. No evidence of  mitral valve regurgitation. No evidence of mitral valve stenosis.   Tricuspid Valve: The tricuspid valve is normal in structure. Tricuspid  valve regurgitation is not demonstrated. No evidence of tricuspid  stenosis.   Aortic Valve: The aortic valve is calcified. There is severe calcifcation  of the aortic valve. There is severe thickening of the aortic valve. There  is severe aortic valve annular calcification. Aortic valve regurgitation  is not visualized. Severe aortic   stenosis is present. Aortic valve mean gradient measures 41.0 mmHg.  Aortic valve peak gradient measures 63.0 mmHg. Aortic valve area, by VTI  measures 0.72 cm.   Pulmonic Valve: The pulmonic valve was normal in structure. Pulmonic valve  regurgitation is not visualized. No  evidence of pulmonic stenosis.   Aorta: The aortic root is normal in size and structure.   Venous: The inferior vena cava is normal in size with greater than 50%  respiratory variability, suggesting right atrial pressure of 3 mmHg.   IAS/Shunts: The interatrial septum appears to be lipomatous. No atrial  level shunt detected by color flow Doppler.      LEFT VENTRICLE  PLAX 2D  LVIDd:         3.40 cm  Diastology  LVIDs:         2.30 cm  LV e' medial:    6.42 cm/s  LV PW:         1.00 cm  LV E/e' medial:  8.6  LV IVS:        1.00 cm  LV e' lateral:   8.16 cm/s  LVOT diam:     1.90 cm  LV E/e' lateral: 6.8  LV SV:         52  LV SV Index:   30  LVOT Area:     2.84 cm      RIGHT VENTRICLE             IVC  RV Basal diam:  3.20 cm     IVC diam: 1.30 cm  RV S prime:     12.95 cm/s  TAPSE (M-mode): 1.7 cm   LEFT ATRIUM             Index       RIGHT ATRIUM  Index  LA diam:        4.10 cm 2.34 cm/m  RA Area:     11.30 cm  LA Vol (A2C):   23.7 ml 13.53 ml/m RA Volume:   27.00 ml  15.42 ml/m  LA Vol (A4C):   19.6 ml 11.19 ml/m  LA Biplane Vol: 23.3 ml 13.30 ml/m   AORTIC VALVE  AV Area (Vmax):    0.58 cm  AV Area (Vmean):   0.56 cm  AV Area (VTI):     0.72 cm  AV Vmax:           397.00 cm/s  AV Vmean:          311.000 cm/s  AV VTI:            0.719 m  AV Peak Grad:      63.0 mmHg  AV Mean Grad:      41.0 mmHg  LVOT Vmax:         81.80 cm/s  LVOT Vmean:        61.550 cm/s  LVOT VTI:          0.183 m  LVOT/AV VTI ratio: 0.25     AORTA  Ao Root diam: 3.00 cm   MITRAL VALVE               TRICUSPID VALVE  MV Area (PHT): 2.91 cm    TR Peak grad:   25.4 mmHg  MV Decel Time: 261 msec    TR Vmax:        252.00 cm/s  MV E velocity: 55.30 cm/s  MV A velocity: 86.80 cm/s  SHUNTS  MV E/A ratio:  0.64        Systemic VTI:  0.18 m                             Systemic Diam: 1.90 cm   Fransico Him MD  Electronically signed by Fransico Him MD  Signature Date/Time:  02/19/2021/3:56:02 PM         Final     Physicians  Panel Physicians Referring Physician Case Authorizing Physician  Adrian Prows, MD (Primary)     Procedures  RIGHT/LEFT HEART CATH AND CORONARY ANGIOGRAPHY   Conclusion  Right and left heart catheterization 04/16/2021: Hemodynamic data: RA 2/1, mean 0 mmHg.  RV 24/-2, EDP 1 mmHg.  PA 24/6, mean 14 mmHg.  PW 9/4, mean 5 mmHg. PA saturation 78%, aortic saturation 100%.  Cardiac output by Fick was 5.16 with a cardiac index of 2.98. LV: 177/2, EDP 13 mmHg.  Aorta: 137/67, mean 95 mmHg. Peak to peak gradient 33 with a mean gradient of 29.4 mmHg.  Calculated aortic valve area 0.88 cm. Angiographic data: Left main: Mildly calcified with mild luminal irregularity. CX: Large vessel, giving origin to small marginals and continues in the AV groove.  Proximal CX has calcified 60 to 70% stenosis.  OM 3 is the largest vessel with a proximal 80% calcific stenosis.  OM 1 and 2 are very small. LAD: Large-caliber vessel, mild diffuse disease, mild calcification is evident in the proximal and mid segment.  D1 and D2 are moderate sized without significant disease. RCA: Large-caliber vessel with mild diffuse disease and mild coronary calcification. Rec: Patient has severe aortic stenosis by echocardiogram with a mean gradient >40 mmHg.  She now has severe aortic stenosis with symptomatic shortness of breath and aortic stenosis related angina pectoris at night,  no exertional chest pain.  Although she has significant stenosis in the circumflex, do not think she needs intervention to the circumflex.  She will benefit from TAVR evaluation.  50 mL contrast utilized.       Recommendations  Antiplatelet/Anticoag Recommend Aspirin 81mg  daily for moderate CAD.   Procedural Details  Technical Details Procedure performed:  Left heart catheterization including hemodynamic monitoring of the left ventricle, LV gram, selective right and left coronary arteriography.  Right heart catheterization and cannulation of cardiac output and cardiac index by Fick.  Indication: LEILANY DIGERONIMO  is a 80 y.o.Caucasian female patient with aortic stenosis, stage IIIa chronic kidney disease, hyperlipidemia, obstructive sleep apnea on CPAP, last evaluated by cardiology in 2017, recently underwent repeat echocardiogram on 02/19/2021 revealing severe aortic stenosis.  She is now referred to me for evaluation of valvular heart disease.   She has noticed gradual worsening dyspnea and also when she lays down she feels heaviness in her chest.  Hence is brought to the cardiac catheterization lab to evaluate the  coronary anatomy for definitive diagnosis of CAD prior to evaluation for AV replacement. Right heart cath performed to evaluate dyspnea.  Right AC vein access for right heart and Right radial access for left heart catheterization was utilized for performing the procedure.   Technique:   A 5 French  sheath introduced into right right AC  vein for access under fluroscopic guidance.  A 5 French Swan-Ganz catheter was advanced with balloon inflated  under fluoroscopic guidance into first the right atrium followed by the right ventricle and into the pulmonary artery to pulmonary artery wedge position. Hemodynamics were obtained in a locations.  After hemodynamics were completed, samples were taken for SaO2% measurement to be used in Fick  Calculation of cardiac output and cardiac index. The catheter was then pulled back the balloon down and then completely out of the body. Hemostasis obtained by manual pressure over the access site.  Under sterile precautions using a 6 French right radial  arterial access, a 6 French sheath was introduced into the right radial artery. A 5 [place] Tig 4 catheter was advanced into the ascending aorta selective  right coronary artery and left coronary artery was cannulated and angiography was performed in multiple views. The catheter was pulled back Out  of the body over exchange length J-wire.  Same catheter was used to perform LV hemodynamics with crossing the valve with moderate difficulty with straight versacore wire. Catheter exchanged out of the body over J-Wire. NO immediate complications noted. Patient tolerated the procedure well. Contrast used: 54mL. Estimated blood loss <50 mL.   During this procedure medications were administered to achieve and maintain moderate conscious sedation while the patient's heart rate, blood pressure, and oxygen saturation were continuously monitored and I was present face-to-face 100% of this time.   Medications (Filter: Administrations occurring from 1505 to 1629 on 04/16/21)  important  Continuous medications are totaled by the amount administered until 04/16/21 1629.   Heparin (Porcine) in NaCl 1000-0.9 UT/500ML-% SOLN (mL) Total volume:  1,000 mL Date/Time Rate/Dose/Volume Action   04/16/21 1518 500 mL Given   1518 500 mL Given    midazolam (VERSED) injection (mg) Total dose:  1 mg Date/Time Rate/Dose/Volume Action   04/16/21 1534 1 mg Given    fentaNYL (SUBLIMAZE) injection (mcg) Total dose:  25 mcg Date/Time Rate/Dose/Volume Action   04/16/21 1535 25 mcg Given    Radial Cocktail/Verapamil only (mL) Total volume:  10 mL Date/Time Rate/Dose/Volume Action  04/16/21 1552 10 mL Given    heparin sodium (porcine) injection (Units) Total dose:  3,000 Units Date/Time Rate/Dose/Volume Action   04/16/21 1558 3,000 Units Given    lidocaine (PF) (XYLOCAINE) 1 % injection (mL) Total volume:  4 mL Date/Time Rate/Dose/Volume Action   04/16/21 1538 4 mL Given    iohexol (OMNIPAQUE) 350 MG/ML injection (mL) Total volume:  40 mL Date/Time Rate/Dose/Volume Action   04/16/21 1615 40 mL Given    Sedation Time  Sedation Time Physician-1: 37 minutes 46 seconds Contrast  Medication Name Total Dose  iohexol (OMNIPAQUE) 350 MG/ML injection 40 mL   Radiation/Fluoro  Fluoro time: 3.4  (min) DAP: 4913 (mGycm2) Cumulative Air Kerma: 87 (mGy) Coronary Findings  Diagnostic Dominance: Right Left Anterior Descending  There is mild diffuse disease throughout the vessel.  Left Circumflex  Ost Cx to Prox Cx lesion is 70% stenosed. The lesion is concentric. The lesion is calcified.  First Obtuse Marginal Branch  Vessel is small in size.  Second Obtuse Marginal Branch  Vessel is small in size.  Third Obtuse Marginal Branch  Vessel is large in size.  3rd Mrg lesion is 80% stenosed. The lesion is calcified.  Right Coronary Artery  The vessel exhibits minimal luminal irregularities.  Intervention  No interventions have been documented. Right Heart  Right Heart Pressures LV EDP is normal. Normal right heart pressure   Left Heart  Left Ventricle LV end diastolic pressure is normal.  Aortic Valve There is moderate aortic valve stenosis. The aortic valve is calcified.   Coronary Diagrams  Diagnostic Dominance: Right Intervention  Implants     No implant documentation for this case.   Syngo Images   Show images for CARDIAC CATHETERIZATION Images on Long Term Storage   Show images for Taralynn, Quiett to Procedure Log  Procedure Log    Hemo Data  Flowsheet Row Most Recent Value  Fick Cardiac Output 5.16 L/min  Fick Cardiac Output Index 2.98 (L/min)/BSA  Aortic Mean Gradient 29.43 mmHg  Aortic Peak Gradient 33 mmHg  Aortic Valve Area 0.88  Aortic Value Area Index 0.51 cm2/BSA  RA A Wave 4 mmHg  RA V Wave 1 mmHg  RA Mean 9 mmHg  RV Systolic Pressure 24 mmHg  RV Diastolic Pressure -2 mmHg  RV EDP 1 mmHg  PA Systolic Pressure 10 mmHg  PA Diastolic Pressure -1 mmHg  PA Mean 7 mmHg  PW A Wave 9 mmHg  PW V Wave 4 mmHg  PW Mean 5 mmHg  AO Systolic Pressure 564 mmHg  AO Diastolic Pressure 76 mmHg  AO Mean 332 mmHg  LV Systolic Pressure 951 mmHg  LV Diastolic Pressure 2 mmHg  LV EDP 13 mmHg  AOp Systolic Pressure 884 mmHg  AOp Diastolic  Pressure 69 mmHg  AOp Mean Pressure 166 mmHg  LVp Systolic Pressure 063 mmHg  LVp Diastolic Pressure 1 mmHg  LVp EDP Pressure 15 mmHg  QP/QS 1  TPVR Index 4.69 HRUI  TSVR Index 31.81 HRUI  TPVR/TSVR Ratio 0.15    ADDENDUM REPORT: 06/21/2021 12:50   CLINICAL DATA:  Aortic stenosis   EXAM: Cardiac TAVR CT   TECHNIQUE: The patient was scanned on a Siemens Force 016 slice scanner. A 120 kV retrospective scan was triggered in the descending thoracic aorta at 111 HU's. Gantry rotation speed was 270 msecs and collimation was .9 mm. No beta blockade or nitro were given. The 3D data set was reconstructed in 5% intervals of the R-R cycle.  Systolic and diastolic phases were analyzed on a dedicated work station using MPR, MIP and VRT modes. The patient received 80 cc of contrast.   FINDINGS: Aortic Valve: Tri leaflet calcified with restricted leaflet motion Score -> 1646   Aorta: Moderate calcified atherosclerosis with bovine arch and no aneurysm   Sinotubular Junction: 23 mm   Ascending Thoracic Aorta: 30 mm   Aortic Arch: 24 mm   Descending Thoracic Aorta: 21 mm   Sinus of Valsalva Measurements:   Non-coronary: 27.9 mm   Right - coronary: 25.7 mm   Left - coronary: 25.3 mm   Coronary Artery Height above Annulus:   Left Main: 12.6 mm above annulus   Right Coronary: 13.74 mm above annulus   Virtual Basal Annulus Measurements:   Maximum/Minimum Diameter: 25.44 x 21.22 mm   Perimeter: 73 mm   Area: 415 mm 2   Coronary Arteries: Sufficient height above annulus for deployment   Optimum Fluoroscopic Angle for Delivery: LAO 19 Caudal 13 degrees   IMPRESSION: 1 Tri leaflet AV with calcium score 1646 and annular area of 415 mm 2 suitable for a 23 mm Sapien 3 valve   2.  Normal aortic root 3.0 cm moderate calcific atherosclerosis   3.  Coronary arteries sufficient height above annulus for deployment   4. Optimum angiographic angle for deployment LAO 19 Caudal  13 degrees   Jenkins Rouge     Electronically Signed   By: Jenkins Rouge M.D.   On: 06/21/2021 12:50    Addended by Josue Hector, MD on 06/21/2021 12:52 PM   Study Result  Narrative & Impression  EXAM: OVER-READ INTERPRETATION  CT CHEST   The following report is an over-read performed by radiologist Dr. Vinnie Langton of [facility], [place] on 06/21/2021. This over-read does not include interpretation of cardiac or coronary anatomy or pathology. The coronary calcium score/coronary CTA interpretation by the cardiologist is attached.   COMPARISON:  None.   FINDINGS: Extracardiac findings will be described separately under dictation for contemporaneously obtained CTA chest, abdomen and pelvis.   IMPRESSION: Please see separate dictation for contemporaneously obtained CTA chest, abdomen and pelvis dated 06/21/2021 for full description of relevant extracardiac findings.   Electronically Signed: By: Vinnie Langton M.D. On: 06/21/2021 12:12    Narrative & Impression  CLINICAL DATA:  80 year old female with history of severe aortic stenosis. Preprocedural study prior to potential transcatheter aortic valve replacement (TAVR) procedure.   EXAM: CT ANGIOGRAPHY CHEST, ABDOMEN AND PELVIS   TECHNIQUE: Multidetector CT imaging through the chest, abdomen and pelvis was performed using the standard protocol during bolus administration of intravenous contrast. Multiplanar reconstructed images and MIPs were obtained and reviewed to evaluate the vascular anatomy.   CONTRAST:  171mL OMNIPAQUE IOHEXOL 350 MG/ML SOLN   COMPARISON:  No prior chest CT. CT of the abdomen and pelvis 03/21/2019.   FINDINGS: CTA CHEST FINDINGS   Cardiovascular: Heart size is normal. There is no significant pericardial fluid, thickening or pericardial calcification. There is aortic atherosclerosis, as well as atherosclerosis of the great vessels of the mediastinum and the coronary  arteries, including calcified atherosclerotic plaque in the left main, left anterior descending and right coronary arteries. Severe thickening and calcification of the aortic valve.   Mediastinum/Lymph Nodes: No pathologically enlarged mediastinal or hilar lymph nodes. Moderate to large hiatal hernia. Severe thickening of the middle and distal thirds of the esophagus without discrete esophageal mass. No axillary lymphadenopathy.   Lungs/Pleura: No suspicious appearing pulmonary nodules  or masses are noted. No acute consolidative airspace disease. No pleural effusions.   Musculoskeletal/Soft Tissues: There are no aggressive appearing lytic or blastic lesions noted in the visualized portions of the skeleton.   CTA ABDOMEN AND PELVIS FINDINGS   Hepatobiliary: Diffuse low attenuation throughout the hepatic parenchyma strongly suggestive of hepatic steatosis (difficult to say for certain on today's contrast enhanced examination). No discrete cystic or solid hepatic lesions. No intra or extrahepatic biliary ductal dilatation. Large gallstone in the neck of the gallbladder with faint peripheral and central calcifications measuring 2.6 cm in diameter. Gallbladder is only moderately distended. No gallbladder wall thickening or surrounding pericholecystic fluid or inflammatory changes to suggest an acute cholecystitis at this time.   Pancreas: No pancreatic mass. No pancreatic ductal dilatation. No pancreatic or peripancreatic fluid collections or inflammatory changes.   Spleen: Unremarkable.   Adrenals/Urinary Tract: Mild multifocal cortical thinning in the kidneys bilaterally. No suspicious renal lesions. No hydroureteronephrosis. Urinary bladder is nearly decompressed, but otherwise unremarkable in appearance. Bilateral adrenal glands are normal in appearance.   Stomach/Bowel: The appearance of the distal aspect of the stomach is unremarkable. No pathologic dilatation of small  bowel or colon. Several colonic diverticulae are noted, particularly in the sigmoid colon, without surrounding inflammatory changes to suggest an acute diverticulitis at this time. Normal appendix.   Vascular/Lymphatic: Aortic atherosclerosis, with vascular findings and measurements pertinent to potential TAVR procedure, as detailed below. No aneurysm or dissection noted in the abdominal or pelvic vasculature. No lymphadenopathy noted in the abdomen or pelvis.   Reproductive: Several small coarse calcifications in the uterus, likely tiny calcified fibroids. Uterus and ovaries are otherwise unremarkable in appearance.   Other: No significant volume of ascites.  No pneumoperitoneum.   Musculoskeletal: There are no aggressive appearing lytic or blastic lesions noted in the visualized portions of the skeleton.   VASCULAR MEASUREMENTS PERTINENT TO TAVR:   AORTA:   Minimal Aortic Diameter-1.2 x 0.9 cm mm   Severity of Aortic Calcification-severe   RIGHT PELVIS:   Right Common Iliac Artery -   Minimal Diameter-6.5 x 4.2 mm   Tortuosity-mild   Calcification-moderate to severe   Right External Iliac Artery -   Minimal Diameter-5.3 x 5.2 mm   Tortuosity-mild   Calcification-mild   Right Common Femoral Artery -   Minimal Diameter-6.1 x 5.5 mm   Tortuosity-mild   Calcification-mild   LEFT PELVIS:   Left Common Iliac Artery -   Minimal Diameter-6.1 x 5.4 mm   Tortuosity-mild   Calcification-moderate to severe   Left External Iliac Artery -   Minimal Diameter-5.4 x 5.4 mm   Tortuosity-mild-to-moderate   Calcification-mild   Left Common Femoral Artery -   Minimal Diameter-5.9 x 4.9 mm   Tortuosity-mild   Calcification-mild   Review of the MIP images confirms the above findings.   IMPRESSION: 1. Vascular findings and measurements pertinent to potential TAVR procedure, as detailed above. 2. Severe thickening and calcification of the aortic  valve, compatible with reported clinical history of severe aortic stenosis. 3. Moderate to large hiatal hernia with severe thickening of the middle to distal thirds of the esophagus without discrete esophageal mass. This may simply reflect chronic reflux esophagitis, however, further evaluation with endoscopy should be considered if there is any clinical concern for esophageal metaplasia or neoplasia. 4. Hepatic steatosis. 5. Cholelithiasis without evidence of acute cholecystitis at this time. 6. Colonic diverticulosis without evidence of acute diverticulitis at this time. 7. Additional incidental findings, as above.  Electronically Signed   By: Vinnie Langton M.D.   On: 06/21/2021 12:41      STS RISK CALCULATOR: Isolated AVR: Risk of Mortality: 2.811% Renal Failure: 0.857% Permanent Stroke: 2.421% Prolonged Ventilation: 6.072% DSW Infection: 0.068% Reoperation: 4.318% Morbidity or Mortality: 11.925% Short Length of Stay: 35.859% Long Length of Stay: 5.147%   Impression:  This 80 year old woman has stage D, severe, symptomatic aortic stenosis with New York Heart Association class II symptoms of exertional fatigue and shortness of breath consistent with chronic diastolic congestive heart failure.  I have personally reviewed her 2D echocardiogram, cardiac catheterization, and CTA studies.  Her echocardiogram in April 2022 showed a trileaflet aortic valve that was heavily calcified and thickened with restricted leaflet mobility.  The mean gradient was 41 mmHg with a peak gradient of 63 mmHg.  Dimensionless index was 0.2 with a valve area of 0.63 cm.  Left ventricular systolic function was normal.  Cardiac catheterization showed significant single-vessel coronary disease involving the left circumflex and obtuse marginal branch.  With no angina this disease can be treated medically.  I agree that aortic valve replacement is indicated in this patient for relief of her  symptoms and to prevent progressive left ventricular deterioration.  Given her age and debilitation from a long history of psoriatic arthritis I think the best option for her would be transcatheter aortic valve replacement.  Her gated cardiac CTA shows anatomy suitable for TAVR using a SAPIEN 3 valve.  Her abdominal and pelvic CTA shows some calcified atherosclerosis of the proximal common iliac arteries and relatively small iliac and femoral vessels but I think she probably has adequate pelvic vascular access.  The patient and her husband were counseled at length regarding treatment alternatives for management of severe symptomatic aortic stenosis. The risks and benefits of surgical intervention has been discussed in detail. Long-term prognosis with medical therapy was discussed. Alternative approaches such as conventional surgical aortic valve replacement, transcatheter aortic valve replacement, and palliative medical therapy were compared and contrasted at length. This discussion was placed in the context of the patient's own specific clinical presentation and past medical history. All of their questions have been addressed.   Following the decision to proceed with transcatheter aortic valve replacement, a discussion was held regarding what types of management strategies would be attempted intraoperatively in the event of life-threatening complications, including whether or not the patient would be considered a candidate for the use of cardiopulmonary bypass and/or conversion to open sternotomy for attempted surgical intervention.  I think she would be a candidate for emergent sternotomy to manage any intraoperative complications although she would have a very difficult recovery given her debilitation from psoriatic arthritis.  The patient is aware of the fact that transient use of cardiopulmonary bypass may be necessary. The patient has been advised of a variety of complications that might develop including  but not limited to risks of death, stroke, paravalvular leak, aortic dissection or other major vascular complications, aortic annulus rupture, device embolization, cardiac rupture or perforation, mitral regurgitation, acute myocardial infarction, arrhythmia, heart block or bradycardia requiring permanent pacemaker placement, congestive heart failure, respiratory failure, renal failure, pneumonia, infection, other late complications related to structural valve deterioration or migration, or other complications that might ultimately cause a temporary or permanent loss of functional independence or other long term morbidity. The patient provides full informed consent for the procedure as described and all questions were answered.      Plan:  She will be scheduled for transfemoral transcatheter  aortic valve replacement using a SAPIEN 3 valve on 08/06/2021.  She takes an Enbrel injection every Tuesday and is going to hold that for 2 weeks preoperatively.  I spent 60 minutes performing this consultation and > 50% of this time was spent face to face counseling and coordinating the care of this patient's severe symptomatic aortic stenosis.   Gaye Pollack, MD

## 2021-07-28 NOTE — H&P (View-Only) (Signed)
Patient ID: RONDELL FRICK, female   DOB: 1941/04/17, 80 y.o.   MRN: [id number]  [facility]         [address]       [place] [address]             [phone]          CARDIOTHORACIC SURGERY CONSULTATION REPORT  PCP is [name], NP Referring Provider is Sherren Mocha, MD Primary Cardiologist is Adrian Prows, MD  Reason for consultation: Severe aortic stenosis  HPI:  The patient is an 80 year old woman with a long history of psoriatic arthritis currently on Enbrel, OSA on CPAP, hypercholesteremia, and severe aortic stenosis who was referred by Dr. Einar Gip for consideration of aortic valve replacement.  She has remained relatively active considering her arthritis but reports progressive exertional shortness of breath and fatigue.  An echocardiogram in 2017 showed mild aortic stenosis with a mean gradient of 11 mmHg.  Her most recent echo on 02/19/2021 showed a mean gradient of 41 mmHg with a valve area of 0.72 cm by VTI.  Left ventricular ejection fraction was 60 to 65% with grade 1 diastolic dysfunction.  There is no mitral stenosis or regurgitation. Cardiac catheterization showed moderate stenosis of the left circumflex with 60 to 70% proximal stenosis and an 80% calcific stenosis of a third marginal branch.  The LAD had mild nonobstructive disease.  The RCA had mild calcific plaque but no stenosis.  The mean gradient at cath was 29 mmHg with a peak to peak gradient of 33 mmHg.  Aortic valve area was calculated at 0.88 cm.  Right heart pressures and cardiac index were normal.  She is here today with her husband.  She reports a 8-month history of progressive exertional fatigue and shortness of breath.  This occurs with a moderate degree of exertion but she is still able to take care of herself and her house without symptoms.  She denies chest discomfort or tightness.  She denies orthopnea and PND.  She has  had some dizziness but no syncope.  Past Medical History:  Diagnosis Date   H/O total knee replacement 2001   Right   High cholesterol    History of tonsillectomy and adenoidectomy 1946   Psoriatic arthritis ([place]) 1990   Sleep apnea with use of continuous positive airway pressure (CPAP) 2004   Thyroid disease     Past Surgical History:  Procedure Laterality Date   FOOT SURGERY     fused arch in left foot   RIGHT/LEFT HEART CATH AND CORONARY ANGIOGRAPHY N/A 04/16/2021   Procedure: RIGHT/LEFT HEART CATH AND CORONARY ANGIOGRAPHY;  Surgeon: Adrian Prows, MD;  Location: [facility];  Service: Cardiovascular;  Laterality: N/A;   SPINE SURGERY     TOTAL KNEE ARTHROPLASTY Left     Family History  Problem Relation Age of Onset   Heart attack Father    Heart disease Sister     Social History   Socioeconomic History   Marital status: Married    Spouse name: Not on file   Number of children: Not on file   Years of education: Not on file   Highest education level: Not on file  Occupational History   Not on file  Tobacco Use   Smoking status: Former    Packs/day: 2.00    Years: 18.00    Pack years: 36.00    Types: Cigarettes  Quit date: 13    Years since quitting: 47.7   Smokeless tobacco: Never  Vaping Use   Vaping Use: Never used  Substance and Sexual Activity   Alcohol use: Yes    Alcohol/week: 1.0 - 2.0 standard drink    Types: 1 - 2 Standard drinks or equivalent per week    Comment: Social drinker, couple times a week   Drug use: No   Sexual activity: Not on file  Other Topics Concern   Not on file  Social History Narrative   Diet? Normal      Do you drink/eat things with caffeine? Yes, 1 mt. Dew per day      Marital status?              m                      What year were you married? 1988      Do you live in a house, apartment, assisted living, condo, trailer, etc.? Townhouse      Is it one or more stories? 2 story      How many persons live in  your home? 2      Do you have any pets in your home? (please list) no      Current or past profession: Is manager      Do you exercise?     No (did it for 50 years)                       Type & how often?      Do you have a living will? yes      Do you have a DNR form?        yes                          If not, do you want to discuss one?      Do you have signed POA/HPOA for forms?       Social Determinants of Health   Financial Resource Strain: Not on file  Food Insecurity: Not on file  Transportation Needs: Not on file  Physical Activity: Not on file  Stress: Not on file  Social Connections: Not on file  Intimate Partner Violence: Not on file    Prior to Admission medications   Medication Sig Start Date End Date Taking? Authorizing Provider  ammonium lactate (LAC-HYDRIN) 12 % lotion Apply 1 application topically as needed for dry skin. 01/08/21  Yes [provider]  aspirin EC 81 MG tablet Take 81 mg by mouth daily. Swallow whole.   Yes [provider]  Cholecalciferol (VITAMIN D3) 250 MCG (10000 UT) capsule Take 10,000 Units by mouth daily. Except none on Saturday and Sunday   Yes [provider]  etanercept (ENBREL) 50 MG/ML injection Inject 50 mg into the skin every Sunday.    Yes [provider]  ezetimibe (ZETIA) 10 MG tablet Take 1 tablet (10 mg total) by mouth daily. 06/12/21  Yes [name], NP  HYDROcodone-acetaminophen (NORCO) 5-325 MG tablet Take 1 tablet by mouth at bedtime. 07/11/21  Yes [name], NP  levothyroxine (SYNTHROID) 88 MCG tablet Take one tablet by mouth once daily 30 minutes before breakfast on an empty stomach for thyroid. 02/26/21  Yes [name], NP  metoprolol tartrate (LOPRESSOR) 50 MG tablet Take 1 tablet (50 mg) 2 hours  prior to your CT scan (at 8:45AM). Bring the second tablet with you but do not take unless instructed. 05/01/21  Yes Sherren Mocha, MD  simvastatin (ZOCOR) 10 MG tablet TAKE  1 TABLET BY MOUTH EVERYDAY AT BEDTIME 06/26/21  Yes Adrian Prows, MD    Current Outpatient Medications  Medication Sig Dispense Refill   ammonium lactate (LAC-HYDRIN) 12 % lotion Apply 1 application topically as needed for dry skin.     aspirin EC 81 MG tablet Take 81 mg by mouth daily. Swallow whole.     Cholecalciferol (VITAMIN D3) 250 MCG (10000 UT) capsule Take 10,000 Units by mouth daily. Except none on Saturday and Sunday     etanercept (ENBREL) 50 MG/ML injection Inject 50 mg into the skin every Sunday.      ezetimibe (ZETIA) 10 MG tablet Take 1 tablet (10 mg total) by mouth daily. 30 tablet 0   HYDROcodone-acetaminophen (NORCO) 5-325 MG tablet Take 1 tablet by mouth at bedtime. 30 tablet 0   levothyroxine (SYNTHROID) 88 MCG tablet Take one tablet by mouth once daily 30 minutes before breakfast on an empty stomach for thyroid. 90 tablet 3   metoprolol tartrate (LOPRESSOR) 50 MG tablet Take 1 tablet (50 mg) 2 hours prior to your CT scan (at 8:45AM). Bring the second tablet with you but do not take unless instructed. 2 tablet 0   simvastatin (ZOCOR) 10 MG tablet TAKE 1 TABLET BY MOUTH EVERYDAY AT BEDTIME 90 tablet 3   No current facility-administered medications for this visit.    Allergies  Allergen Reactions   Codeine Nausea And Vomiting    Weakness and dysequilibrium   Statins Hives    Myalgias       Review of Systems:   General:  normal appetite, + decreased energy, no weight gain, no weight loss, no fever  Cardiac:  No chest pain with exertion, no chest pain at rest, +SOB with moderate exertion, no resting SOB, no PND, no orthopnea, no palpitations, + arrhythmia, no atrial fibrillation, no LE edema, + dizzy spells, no syncope  Respiratory:  + exertional shortness of breath, no home oxygen, no productive cough, no dry cough, no bronchitis, no wheezing, no hemoptysis, no asthma, no pain with inspiration or cough, + sleep apnea, + CPAP at night  GI:   no difficulty swallowing,  no reflux, no frequent heartburn, no hiatal hernia, no abdominal pain, no constipation, no diarrhea, no hematochezia, no hematemesis, no melena  GU:   no dysuria,  no frequency, no urinary tract infection, no hematuria,no kidney stones, no kidney disease  Vascular:  no pain suggestive of claudication, no pain in feet, + leg cramps, no varicose veins, no DVT, no non-healing foot ulcer  Neuro:   no stroke, no TIA's, no seizures, no headaches, no temporary blindness one eye,  no slurred speech, no peripheral neuropathy, no chronic pain, no instability of gait, no memory/cognitive dysfunction  Musculoskeletal: + arthritis - primarily involving the spine and hands, no joint swelling, no myalgias, no difficulty walking, normal mobility   Skin:   no rash, no itching, no skin infections, no pressure sores or ulcerations  Psych:   no anxiety, no depression, no nervousness, no unusual recent stress  Eyes:   no blurry vision, + floaters, no recent vision changes, + wears glasses   ENT:   no hearing loss, no loose or painful teeth, no dentures, last saw dentist recently  Hematologic:  no easy bruising, no abnormal bleeding, no clotting disorder, no frequent  epistaxis  Endocrine:  no diabetes, does not check CBG's at home     Physical Exam:   BP (!) 143/84   Pulse 92   Resp 20   Ht 5\' 5"  (1.651 m)   Wt 143 lb (64.9 kg)   BMI 23.80 kg/m   General:  Elderly but  well-appearing  HEENT:  Unremarkable, NCAT, PERLA, EOMI  Neck:   no JVD, no bruits, no adenopathy   Chest:   clear to auscultation, symmetrical breath sounds, no wheezes, no rhonchi   CV:   RRR, 3/6 systolic murmur RSB, no diastolic murmur  Abdomen:  soft, non-tender, no masses   Extremities:  warm, well-perfused, pulses palpable at ankles, no lower extremity edema  Rectal/GU  Deferred  Neuro:   Grossly non-focal and symmetrical throughout  Skin:   Clean and dry, no rashes, no breakdown  Diagnostic Tests:  ECHOCARDIOGRAM REPORT          Patient Name:   KAMAIYA ANTILLA Date of Exam: 02/19/2021  Medical Rec #:  [id number]          Height:       65.0 in  Accession #:    [id number]         Weight:       150.2 lb  Date of Birth:  12-12-1940           BSA:          1.751 m  Patient Age:    89 years           BP:           128/80 mmHg  Patient Gender: F                  HR:           88 bpm.  Exam Location:  [address]   Procedure: 2D Echo, Cardiac Doppler and Color Doppler   Indications:    R01.1 Murmur     History:        Patient has prior history of Echocardiogram examinations,  most                  recent 05/20/2016. Risk Factors:Dyslipidemia. Sleep apnea.                  Thyroid disease.     Sonographer:    Cresenciano Lick RDCS  Referring Phys: [place]     1. Left ventricular ejection fraction, by estimation, is 60 to 65%. The  left ventricle has normal function. The left ventricle has no regional  wall motion abnormalities. There is mild left ventricular hypertrophy of  the basal-septal segment. Left  ventricular diastolic parameters are consistent with Grade I diastolic  dysfunction (impaired relaxation).   2. Right ventricular systolic function is normal. The right ventricular  size is normal. There is normal pulmonary artery systolic pressure. The  estimated right ventricular systolic pressure is 64.3 mmHg.   3. The mitral valve is normal in structure. No evidence of mitral valve  regurgitation. No evidence of mitral stenosis.   4. The aortic valve is calcified. There is severe calcifcation of the  aortic valve. There is severe thickening of the aortic valve. Aortic valve  regurgitation is not visualized. Severe aortic valve stenosis. Aortic  valve area, by VTI measures 0.72 cm.  Aortic valve mean gradient measures 41.0 mmHg. Aortic valve Vmax measures  3.97 m/s.   5. The  inferior vena cava is normal in size with greater than 50%  respiratory variability,  suggesting right atrial pressure of 3 mmHg.   FINDINGS   Left Ventricle: Left ventricular ejection fraction, by estimation, is 60  to 65%. The left ventricle has normal function. The left ventricle has no  regional wall motion abnormalities. The left ventricular internal cavity  size was normal in size. There is   mild left ventricular hypertrophy of the basal-septal segment. Left  ventricular diastolic parameters are consistent with Grade I diastolic  dysfunction (impaired relaxation). Normal left ventricular filling  pressure.   Right Ventricle: The right ventricular size is normal. No increase in  right ventricular wall thickness. Right ventricular systolic function is  normal. There is normal pulmonary artery systolic pressure. The tricuspid  regurgitant velocity is 2.52 m/s, and   with an assumed right atrial pressure of 3 mmHg, the estimated right  ventricular systolic pressure is 89.3 mmHg.   Left Atrium: Left atrial size was normal in size.   Right Atrium: Right atrial size was normal in size.   Pericardium: There is no evidence of pericardial effusion.   Mitral Valve: The mitral valve is normal in structure. No evidence of  mitral valve regurgitation. No evidence of mitral valve stenosis.   Tricuspid Valve: The tricuspid valve is normal in structure. Tricuspid  valve regurgitation is not demonstrated. No evidence of tricuspid  stenosis.   Aortic Valve: The aortic valve is calcified. There is severe calcifcation  of the aortic valve. There is severe thickening of the aortic valve. There  is severe aortic valve annular calcification. Aortic valve regurgitation  is not visualized. Severe aortic   stenosis is present. Aortic valve mean gradient measures 41.0 mmHg.  Aortic valve peak gradient measures 63.0 mmHg. Aortic valve area, by VTI  measures 0.72 cm.   Pulmonic Valve: The pulmonic valve was normal in structure. Pulmonic valve  regurgitation is not visualized. No  evidence of pulmonic stenosis.   Aorta: The aortic root is normal in size and structure.   Venous: The inferior vena cava is normal in size with greater than 50%  respiratory variability, suggesting right atrial pressure of 3 mmHg.   IAS/Shunts: The interatrial septum appears to be lipomatous. No atrial  level shunt detected by color flow Doppler.      LEFT VENTRICLE  PLAX 2D  LVIDd:         3.40 cm  Diastology  LVIDs:         2.30 cm  LV e' medial:    6.42 cm/s  LV PW:         1.00 cm  LV E/e' medial:  8.6  LV IVS:        1.00 cm  LV e' lateral:   8.16 cm/s  LVOT diam:     1.90 cm  LV E/e' lateral: 6.8  LV SV:         52  LV SV Index:   30  LVOT Area:     2.84 cm      RIGHT VENTRICLE             IVC  RV Basal diam:  3.20 cm     IVC diam: 1.30 cm  RV S prime:     12.95 cm/s  TAPSE (M-mode): 1.7 cm   LEFT ATRIUM             Index       RIGHT ATRIUM  Index  LA diam:        4.10 cm 2.34 cm/m  RA Area:     11.30 cm  LA Vol (A2C):   23.7 ml 13.53 ml/m RA Volume:   27.00 ml  15.42 ml/m  LA Vol (A4C):   19.6 ml 11.19 ml/m  LA Biplane Vol: 23.3 ml 13.30 ml/m   AORTIC VALVE  AV Area (Vmax):    0.58 cm  AV Area (Vmean):   0.56 cm  AV Area (VTI):     0.72 cm  AV Vmax:           397.00 cm/s  AV Vmean:          311.000 cm/s  AV VTI:            0.719 m  AV Peak Grad:      63.0 mmHg  AV Mean Grad:      41.0 mmHg  LVOT Vmax:         81.80 cm/s  LVOT Vmean:        61.550 cm/s  LVOT VTI:          0.183 m  LVOT/AV VTI ratio: 0.25     AORTA  Ao Root diam: 3.00 cm   MITRAL VALVE               TRICUSPID VALVE  MV Area (PHT): 2.91 cm    TR Peak grad:   25.4 mmHg  MV Decel Time: 261 msec    TR Vmax:        252.00 cm/s  MV E velocity: 55.30 cm/s  MV A velocity: 86.80 cm/s  SHUNTS  MV E/A ratio:  0.64        Systemic VTI:  0.18 m                             Systemic Diam: 1.90 cm   Fransico Him MD  Electronically signed by Fransico Him MD  Signature Date/Time:  02/19/2021/3:56:02 PM         Final     Physicians  Panel Physicians Referring Physician Case Authorizing Physician  Adrian Prows, MD (Primary)     Procedures  RIGHT/LEFT HEART CATH AND CORONARY ANGIOGRAPHY   Conclusion  Right and left heart catheterization 04/16/2021: Hemodynamic data: RA 2/1, mean 0 mmHg.  RV 24/-2, EDP 1 mmHg.  PA 24/6, mean 14 mmHg.  PW 9/4, mean 5 mmHg. PA saturation 78%, aortic saturation 100%.  Cardiac output by Fick was 5.16 with a cardiac index of 2.98. LV: 177/2, EDP 13 mmHg.  Aorta: 137/67, mean 95 mmHg. Peak to peak gradient 33 with a mean gradient of 29.4 mmHg.  Calculated aortic valve area 0.88 cm. Angiographic data: Left main: Mildly calcified with mild luminal irregularity. CX: Large vessel, giving origin to small marginals and continues in the AV groove.  Proximal CX has calcified 60 to 70% stenosis.  OM 3 is the largest vessel with a proximal 80% calcific stenosis.  OM 1 and 2 are very small. LAD: Large-caliber vessel, mild diffuse disease, mild calcification is evident in the proximal and mid segment.  D1 and D2 are moderate sized without significant disease. RCA: Large-caliber vessel with mild diffuse disease and mild coronary calcification. Rec: Patient has severe aortic stenosis by echocardiogram with a mean gradient >40 mmHg.  She now has severe aortic stenosis with symptomatic shortness of breath and aortic stenosis related angina pectoris at night,  no exertional chest pain.  Although she has significant stenosis in the circumflex, do not think she needs intervention to the circumflex.  She will benefit from TAVR evaluation.  50 mL contrast utilized.       Recommendations  Antiplatelet/Anticoag Recommend Aspirin 81mg  daily for moderate CAD.   Procedural Details  Technical Details Procedure performed:  Left heart catheterization including hemodynamic monitoring of the left ventricle, LV gram, selective right and left coronary arteriography.  Right heart catheterization and cannulation of cardiac output and cardiac index by Fick.  Indication: MICHELLE WNEK  is a 80 y.o.Caucasian female patient with aortic stenosis, stage IIIa chronic kidney disease, hyperlipidemia, obstructive sleep apnea on CPAP, last evaluated by cardiology in 2017, recently underwent repeat echocardiogram on 02/19/2021 revealing severe aortic stenosis.  She is now referred to me for evaluation of valvular heart disease.   She has noticed gradual worsening dyspnea and also when she lays down she feels heaviness in her chest.  Hence is brought to the cardiac catheterization lab to evaluate the  coronary anatomy for definitive diagnosis of CAD prior to evaluation for AV replacement. Right heart cath performed to evaluate dyspnea.  Right AC vein access for right heart and Right radial access for left heart catheterization was utilized for performing the procedure.   Technique:   A 5 French  sheath introduced into right right AC  vein for access under fluroscopic guidance.  A 5 French Swan-Ganz catheter was advanced with balloon inflated  under fluoroscopic guidance into first the right atrium followed by the right ventricle and into the pulmonary artery to pulmonary artery wedge position. Hemodynamics were obtained in a locations.  After hemodynamics were completed, samples were taken for SaO2% measurement to be used in Fick  Calculation of cardiac output and cardiac index. The catheter was then pulled back the balloon down and then completely out of the body. Hemostasis obtained by manual pressure over the access site.  Under sterile precautions using a 6 French right radial  arterial access, a 6 French sheath was introduced into the right radial artery. A 5 [place] Tig 4 catheter was advanced into the ascending aorta selective  right coronary artery and left coronary artery was cannulated and angiography was performed in multiple views. The catheter was pulled back Out  of the body over exchange length J-wire.  Same catheter was used to perform LV hemodynamics with crossing the valve with moderate difficulty with straight versacore wire. Catheter exchanged out of the body over J-Wire. NO immediate complications noted. Patient tolerated the procedure well. Contrast used: 42mL. Estimated blood loss <50 mL.   During this procedure medications were administered to achieve and maintain moderate conscious sedation while the patient's heart rate, blood pressure, and oxygen saturation were continuously monitored and I was present face-to-face 100% of this time.   Medications (Filter: Administrations occurring from 1505 to 1629 on 04/16/21)  important  Continuous medications are totaled by the amount administered until 04/16/21 1629.   Heparin (Porcine) in NaCl 1000-0.9 UT/500ML-% SOLN (mL) Total volume:  1,000 mL Date/Time Rate/Dose/Volume Action   04/16/21 1518 500 mL Given   1518 500 mL Given    midazolam (VERSED) injection (mg) Total dose:  1 mg Date/Time Rate/Dose/Volume Action   04/16/21 1534 1 mg Given    fentaNYL (SUBLIMAZE) injection (mcg) Total dose:  25 mcg Date/Time Rate/Dose/Volume Action   04/16/21 1535 25 mcg Given    Radial Cocktail/Verapamil only (mL) Total volume:  10 mL Date/Time Rate/Dose/Volume Action  04/16/21 1552 10 mL Given    heparin sodium (porcine) injection (Units) Total dose:  3,000 Units Date/Time Rate/Dose/Volume Action   04/16/21 1558 3,000 Units Given    lidocaine (PF) (XYLOCAINE) 1 % injection (mL) Total volume:  4 mL Date/Time Rate/Dose/Volume Action   04/16/21 1538 4 mL Given    iohexol (OMNIPAQUE) 350 MG/ML injection (mL) Total volume:  40 mL Date/Time Rate/Dose/Volume Action   04/16/21 1615 40 mL Given    Sedation Time  Sedation Time Physician-1: 37 minutes 46 seconds Contrast  Medication Name Total Dose  iohexol (OMNIPAQUE) 350 MG/ML injection 40 mL   Radiation/Fluoro  Fluoro time: 3.4  (min) DAP: 4913 (mGycm2) Cumulative Air Kerma: 87 (mGy) Coronary Findings  Diagnostic Dominance: Right Left Anterior Descending  There is mild diffuse disease throughout the vessel.  Left Circumflex  Ost Cx to Prox Cx lesion is 70% stenosed. The lesion is concentric. The lesion is calcified.  First Obtuse Marginal Branch  Vessel is small in size.  Second Obtuse Marginal Branch  Vessel is small in size.  Third Obtuse Marginal Branch  Vessel is large in size.  3rd Mrg lesion is 80% stenosed. The lesion is calcified.  Right Coronary Artery  The vessel exhibits minimal luminal irregularities.  Intervention  No interventions have been documented. Right Heart  Right Heart Pressures LV EDP is normal. Normal right heart pressure   Left Heart  Left Ventricle LV end diastolic pressure is normal.  Aortic Valve There is moderate aortic valve stenosis. The aortic valve is calcified.   Coronary Diagrams  Diagnostic Dominance: Right Intervention  Implants     No implant documentation for this case.   Syngo Images   Show images for CARDIAC CATHETERIZATION Images on Long Term Storage   Show images for [place], [place] to Procedure Log  Procedure Log    Hemo Data  Flowsheet Row Most Recent Value  Fick Cardiac Output 5.16 L/min  Fick Cardiac Output Index 2.98 (L/min)/BSA  Aortic Mean Gradient 29.43 mmHg  Aortic Peak Gradient 33 mmHg  Aortic Valve Area 0.88  Aortic Value Area Index 0.51 cm2/BSA  RA A Wave 4 mmHg  RA V Wave 1 mmHg  RA Mean 9 mmHg  RV Systolic Pressure 24 mmHg  RV Diastolic Pressure -2 mmHg  RV EDP 1 mmHg  PA Systolic Pressure 10 mmHg  PA Diastolic Pressure -1 mmHg  PA Mean 7 mmHg  PW A Wave 9 mmHg  PW V Wave 4 mmHg  PW Mean 5 mmHg  AO Systolic Pressure 161 mmHg  AO Diastolic Pressure 76 mmHg  AO Mean 096 mmHg  LV Systolic Pressure 045 mmHg  LV Diastolic Pressure 2 mmHg  LV EDP 13 mmHg  AOp Systolic Pressure 409 mmHg  AOp Diastolic  Pressure 69 mmHg  AOp Mean Pressure 811 mmHg  LVp Systolic Pressure 914 mmHg  LVp Diastolic Pressure 1 mmHg  LVp EDP Pressure 15 mmHg  QP/QS 1  TPVR Index 4.69 HRUI  TSVR Index 31.81 HRUI  TPVR/TSVR Ratio 0.15    ADDENDUM REPORT: 06/21/2021 12:50   CLINICAL DATA:  Aortic stenosis   EXAM: Cardiac TAVR CT   TECHNIQUE: The patient was scanned on a Siemens Force 782 slice scanner. A 120 kV retrospective scan was triggered in the descending thoracic aorta at 111 HU's. Gantry rotation speed was 270 msecs and collimation was .9 mm. No beta blockade or nitro were given. The 3D data set was reconstructed in 5% intervals of the R-R cycle.  Systolic and diastolic phases were analyzed on a dedicated work station using MPR, MIP and VRT modes. The patient received 80 cc of contrast.   FINDINGS: Aortic Valve: Tri leaflet calcified with restricted leaflet motion Score -> 1646   Aorta: Moderate calcified atherosclerosis with bovine arch and no aneurysm   Sinotubular Junction: 23 mm   Ascending Thoracic Aorta: 30 mm   Aortic Arch: 24 mm   Descending Thoracic Aorta: 21 mm   Sinus of Valsalva Measurements:   Non-coronary: 27.9 mm   Right - coronary: 25.7 mm   Left - coronary: 25.3 mm   Coronary Artery Height above Annulus:   Left Main: 12.6 mm above annulus   Right Coronary: 13.74 mm above annulus   Virtual Basal Annulus Measurements:   Maximum/Minimum Diameter: 25.44 x 21.22 mm   Perimeter: 73 mm   Area: 415 mm 2   Coronary Arteries: Sufficient height above annulus for deployment   Optimum Fluoroscopic Angle for Delivery: LAO 19 Caudal 13 degrees   IMPRESSION: 1 Tri leaflet AV with calcium score 1646 and annular area of 415 mm 2 suitable for a 23 mm Sapien 3 valve   2.  Normal aortic root 3.0 cm moderate calcific atherosclerosis   3.  Coronary arteries sufficient height above annulus for deployment   4. Optimum angiographic angle for deployment LAO 19 Caudal  13 degrees   Jenkins Rouge     Electronically Signed   By: Jenkins Rouge M.D.   On: 06/21/2021 12:50    Addended by Josue Hector, MD on 06/21/2021 12:52 PM   Study Result  Narrative & Impression  EXAM: OVER-READ INTERPRETATION  CT CHEST   The following report is an over-read performed by radiologist Dr. Vinnie Langton of [facility], [place] on 06/21/2021. This over-read does not include interpretation of cardiac or coronary anatomy or pathology. The coronary calcium score/coronary CTA interpretation by the cardiologist is attached.   COMPARISON:  None.   FINDINGS: Extracardiac findings will be described separately under dictation for contemporaneously obtained CTA chest, abdomen and pelvis.   IMPRESSION: Please see separate dictation for contemporaneously obtained CTA chest, abdomen and pelvis dated 06/21/2021 for full description of relevant extracardiac findings.   Electronically Signed: By: Vinnie Langton M.D. On: 06/21/2021 12:12    Narrative & Impression  CLINICAL DATA:  80 year old female with history of severe aortic stenosis. Preprocedural study prior to potential transcatheter aortic valve replacement (TAVR) procedure.   EXAM: CT ANGIOGRAPHY CHEST, ABDOMEN AND PELVIS   TECHNIQUE: Multidetector CT imaging through the chest, abdomen and pelvis was performed using the standard protocol during bolus administration of intravenous contrast. Multiplanar reconstructed images and MIPs were obtained and reviewed to evaluate the vascular anatomy.   CONTRAST:  126mL OMNIPAQUE IOHEXOL 350 MG/ML SOLN   COMPARISON:  No prior chest CT. CT of the abdomen and pelvis 03/21/2019.   FINDINGS: CTA CHEST FINDINGS   Cardiovascular: Heart size is normal. There is no significant pericardial fluid, thickening or pericardial calcification. There is aortic atherosclerosis, as well as atherosclerosis of the great vessels of the mediastinum and the coronary  arteries, including calcified atherosclerotic plaque in the left main, left anterior descending and right coronary arteries. Severe thickening and calcification of the aortic valve.   Mediastinum/Lymph Nodes: No pathologically enlarged mediastinal or hilar lymph nodes. Moderate to large hiatal hernia. Severe thickening of the middle and distal thirds of the esophagus without discrete esophageal mass. No axillary lymphadenopathy.   Lungs/Pleura: No suspicious appearing pulmonary nodules  or masses are noted. No acute consolidative airspace disease. No pleural effusions.   Musculoskeletal/Soft Tissues: There are no aggressive appearing lytic or blastic lesions noted in the visualized portions of the skeleton.   CTA ABDOMEN AND PELVIS FINDINGS   Hepatobiliary: Diffuse low attenuation throughout the hepatic parenchyma strongly suggestive of hepatic steatosis (difficult to say for certain on today's contrast enhanced examination). No discrete cystic or solid hepatic lesions. No intra or extrahepatic biliary ductal dilatation. Large gallstone in the neck of the gallbladder with faint peripheral and central calcifications measuring 2.6 cm in diameter. Gallbladder is only moderately distended. No gallbladder wall thickening or surrounding pericholecystic fluid or inflammatory changes to suggest an acute cholecystitis at this time.   Pancreas: No pancreatic mass. No pancreatic ductal dilatation. No pancreatic or peripancreatic fluid collections or inflammatory changes.   Spleen: Unremarkable.   Adrenals/Urinary Tract: Mild multifocal cortical thinning in the kidneys bilaterally. No suspicious renal lesions. No hydroureteronephrosis. Urinary bladder is nearly decompressed, but otherwise unremarkable in appearance. Bilateral adrenal glands are normal in appearance.   Stomach/Bowel: The appearance of the distal aspect of the stomach is unremarkable. No pathologic dilatation of small  bowel or colon. Several colonic diverticulae are noted, particularly in the sigmoid colon, without surrounding inflammatory changes to suggest an acute diverticulitis at this time. Normal appendix.   Vascular/Lymphatic: Aortic atherosclerosis, with vascular findings and measurements pertinent to potential TAVR procedure, as detailed below. No aneurysm or dissection noted in the abdominal or pelvic vasculature. No lymphadenopathy noted in the abdomen or pelvis.   Reproductive: Several small coarse calcifications in the uterus, likely tiny calcified fibroids. Uterus and ovaries are otherwise unremarkable in appearance.   Other: No significant volume of ascites.  No pneumoperitoneum.   Musculoskeletal: There are no aggressive appearing lytic or blastic lesions noted in the visualized portions of the skeleton.   VASCULAR MEASUREMENTS PERTINENT TO TAVR:   AORTA:   Minimal Aortic Diameter-1.2 x 0.9 cm mm   Severity of Aortic Calcification-severe   RIGHT PELVIS:   Right Common Iliac Artery -   Minimal Diameter-6.5 x 4.2 mm   Tortuosity-mild   Calcification-moderate to severe   Right External Iliac Artery -   Minimal Diameter-5.3 x 5.2 mm   Tortuosity-mild   Calcification-mild   Right Common Femoral Artery -   Minimal Diameter-6.1 x 5.5 mm   Tortuosity-mild   Calcification-mild   LEFT PELVIS:   Left Common Iliac Artery -   Minimal Diameter-6.1 x 5.4 mm   Tortuosity-mild   Calcification-moderate to severe   Left External Iliac Artery -   Minimal Diameter-5.4 x 5.4 mm   Tortuosity-mild-to-moderate   Calcification-mild   Left Common Femoral Artery -   Minimal Diameter-5.9 x 4.9 mm   Tortuosity-mild   Calcification-mild   Review of the MIP images confirms the above findings.   IMPRESSION: 1. Vascular findings and measurements pertinent to potential TAVR procedure, as detailed above. 2. Severe thickening and calcification of the aortic  valve, compatible with reported clinical history of severe aortic stenosis. 3. Moderate to large hiatal hernia with severe thickening of the middle to distal thirds of the esophagus without discrete esophageal mass. This may simply reflect chronic reflux esophagitis, however, further evaluation with endoscopy should be considered if there is any clinical concern for esophageal metaplasia or neoplasia. 4. Hepatic steatosis. 5. Cholelithiasis without evidence of acute cholecystitis at this time. 6. Colonic diverticulosis without evidence of acute diverticulitis at this time. 7. Additional incidental findings, as above.  Electronically Signed   By: Vinnie Langton M.D.   On: 06/21/2021 12:41      STS RISK CALCULATOR: Isolated AVR: Risk of Mortality: 2.811% Renal Failure: 0.857% Permanent Stroke: 2.421% Prolonged Ventilation: 6.072% DSW Infection: 0.068% Reoperation: 4.318% Morbidity or Mortality: 11.925% Short Length of Stay: 35.859% Long Length of Stay: 5.147%   Impression:  This 80 year old woman has stage D, severe, symptomatic aortic stenosis with New York Heart Association class II symptoms of exertional fatigue and shortness of breath consistent with chronic diastolic congestive heart failure.  I have personally reviewed her 2D echocardiogram, cardiac catheterization, and CTA studies.  Her echocardiogram in April 2022 showed a trileaflet aortic valve that was heavily calcified and thickened with restricted leaflet mobility.  The mean gradient was 41 mmHg with a peak gradient of 63 mmHg.  Dimensionless index was 0.2 with a valve area of 0.63 cm.  Left ventricular systolic function was normal.  Cardiac catheterization showed significant single-vessel coronary disease involving the left circumflex and obtuse marginal branch.  With no angina this disease can be treated medically.  I agree that aortic valve replacement is indicated in this patient for relief of her  symptoms and to prevent progressive left ventricular deterioration.  Given her age and debilitation from a long history of psoriatic arthritis I think the best option for her would be transcatheter aortic valve replacement.  Her gated cardiac CTA shows anatomy suitable for TAVR using a SAPIEN 3 valve.  Her abdominal and pelvic CTA shows some calcified atherosclerosis of the proximal common iliac arteries and relatively small iliac and femoral vessels but I think she probably has adequate pelvic vascular access.  The patient and her husband were counseled at length regarding treatment alternatives for management of severe symptomatic aortic stenosis. The risks and benefits of surgical intervention has been discussed in detail. Long-term prognosis with medical therapy was discussed. Alternative approaches such as conventional surgical aortic valve replacement, transcatheter aortic valve replacement, and palliative medical therapy were compared and contrasted at length. This discussion was placed in the context of the patient's own specific clinical presentation and past medical history. All of their questions have been addressed.   Following the decision to proceed with transcatheter aortic valve replacement, a discussion was held regarding what types of management strategies would be attempted intraoperatively in the event of life-threatening complications, including whether or not the patient would be considered a candidate for the use of cardiopulmonary bypass and/or conversion to open sternotomy for attempted surgical intervention.  I think she would be a candidate for emergent sternotomy to manage any intraoperative complications although she would have a very difficult recovery given her debilitation from psoriatic arthritis.  The patient is aware of the fact that transient use of cardiopulmonary bypass may be necessary. The patient has been advised of a variety of complications that might develop including  but not limited to risks of death, stroke, paravalvular leak, aortic dissection or other major vascular complications, aortic annulus rupture, device embolization, cardiac rupture or perforation, mitral regurgitation, acute myocardial infarction, arrhythmia, heart block or bradycardia requiring permanent pacemaker placement, congestive heart failure, respiratory failure, renal failure, pneumonia, infection, other late complications related to structural valve deterioration or migration, or other complications that might ultimately cause a temporary or permanent loss of functional independence or other long term morbidity. The patient provides full informed consent for the procedure as described and all questions were answered.      Plan:  She will be scheduled for transfemoral transcatheter  aortic valve replacement using a SAPIEN 3 valve on 08/06/2021.  She takes an Enbrel injection every Tuesday and is going to hold that for 2 weeks preoperatively.  I spent 60 minutes performing this consultation and > 50% of this time was spent face to face counseling and coordinating the care of this patient's severe symptomatic aortic stenosis.   Gaye Pollack, MD

## 2021-08-01 ENCOUNTER — Encounter: Payer: Self-pay | Admitting: Physician Assistant

## 2021-08-01 ENCOUNTER — Other Ambulatory Visit: Payer: Self-pay | Admitting: Physician Assistant

## 2021-08-01 ENCOUNTER — Other Ambulatory Visit: Payer: Self-pay

## 2021-08-01 ENCOUNTER — Encounter: Payer: Self-pay | Admitting: Nurse Practitioner

## 2021-08-01 ENCOUNTER — Ambulatory Visit (INDEPENDENT_AMBULATORY_CARE_PROVIDER_SITE_OTHER): Payer: Medicare Other | Admitting: Nurse Practitioner

## 2021-08-01 DIAGNOSIS — Z0000 Encounter for general adult medical examination without abnormal findings: Secondary | ICD-10-CM

## 2021-08-01 DIAGNOSIS — I350 Nonrheumatic aortic (valve) stenosis: Secondary | ICD-10-CM

## 2021-08-01 NOTE — Pre-Procedure Instructions (Signed)
Surgical Instructions    Your procedure is scheduled on Tuesday, August 06, 2021 at 10:45 AM.  Report to [facility] Main Entrance "A" at 8:45 A.M., then check in with the Admitting office.  Call this number if you have problems the morning of surgery:  [phone]   If you have any questions prior to your surgery date call [phone]: Open Monday-Friday 8am-4pm    Remember:  Do not eat or drink after midnight the night before your surgery   Please continue taking all of your current medications through the day before surgery. On the day of surgery take no medications. Continue holding Enbrel as directed by Dr. Cyndia Bent    As of today, STOP taking any Aleve, Naproxen, Ibuprofen, Motrin, Advil, Goody's, BC's, all herbal medications, fish oil, and all vitamins.                     Do NOT Smoke (Tobacco/Vaping) or drink Alcohol 24 hours prior to your procedure.  If you use a CPAP at night, you may bring all equipment for your overnight stay.   Contacts, glasses, piercing's, hearing aid's, dentures or partials may not be worn into surgery, please bring cases for these belongings.    For patients admitted to the hospital, discharge time will be determined by your treatment team.   Patients discharged the day of surgery will not be allowed to drive home, and someone needs to stay with them for 24 hours.  NO VISITORS WILL BE ALLOWED IN PRE-OP WHERE PATIENTS GET READY FOR SURGERY.  ONLY 1 SUPPORT PERSON MAY BE PRESENT IN THE WAITING ROOM WHILE YOU ARE IN SURGERY.  IF YOU ARE TO BE ADMITTED, ONCE YOU ARE IN YOUR ROOM YOU WILL BE ALLOWED TWO (2) VISITORS.  Minor children may have two parents present. Special consideration for safety and communication needs will be reviewed on a case by case basis.   Special instructions:   [facility]- Preparing For Surgery  Before surgery, you can play an important role. Because skin is not sterile, your skin needs to be as free of germs as possible. You  can reduce the number of germs on your skin by washing with CHG (chlorahexidine gluconate) Soap before surgery.  CHG is an antiseptic cleaner which kills germs and bonds with the skin to continue killing germs even after washing.    Oral Hygiene is also important to reduce your risk of infection.  Remember - BRUSH YOUR TEETH THE MORNING OF SURGERY WITH YOUR REGULAR TOOTHPASTE  Please do not use if you have an allergy to CHG or antibacterial soaps. If your skin becomes reddened/irritated stop using the CHG.  Do not shave (including legs and underarms) for at least 48 hours prior to first CHG shower. It is OK to shave your face.  Please follow these instructions carefully.   Shower the NIGHT BEFORE SURGERY and the MORNING OF SURGERY  If you chose to wash your hair, wash your hair first as usual with your normal shampoo.  After you shampoo, rinse your hair and body thoroughly to remove the shampoo.  Use CHG Soap as you would any other liquid soap. You can apply CHG directly to the skin and wash gently with a scrungie or a clean washcloth.   Apply the CHG Soap to your body ONLY FROM THE NECK DOWN.  Do not use on open wounds or open sores. Avoid contact with your eyes, ears, mouth and genitals (private parts). Wash Face and genitals (  private parts)  with your normal soap.   Wash thoroughly, paying special attention to the area where your surgery will be performed.  Thoroughly rinse your body with warm water from the neck down.  DO NOT shower/wash with your normal soap after using and rinsing off the CHG Soap.  Pat yourself dry with a CLEAN TOWEL.  Wear CLEAN PAJAMAS to bed the night before surgery  Place CLEAN SHEETS on your bed the night before your surgery  DO NOT SLEEP WITH PETS.   Day of Surgery: Shower with CHG soap. Do not wear jewelry, make up, nail polish, gel polish, artificial nails, or any other type of covering on natural nails including finger and toenails. If patients  have artificial nails, gel coating, etc. that need to be removed by a nail salon please have this removed prior to surgery. Surgery may need to be canceled/delayed if the surgeon/ anesthesia feels like the patient is unable to be adequately monitored. Do not wear lotions, powders, perfumes, or deodorant. Do not shave 48 hours prior to surgery. Do not bring valuables to the hospital. [facility] is not responsible for any belongings or valuables. Wear Clean/Comfortable clothing the morning of surgery Remember to brush your teeth WITH YOUR REGULAR TOOTHPASTE.   Please read over the following fact sheets that you were given.   3 days prior to your procedure or After your COVID test   You are not required to quarantine however you are required to wear a well-fitting mask when you are out and around people not in your household. If your mask becomes wet or soiled, replace with a new one.   Wash your hands often with soap and water for 20 seconds or clean your hands with an alcohol-based hand sanitizer that contains at least 60% alcohol.   Do not share personal items.   Notify your provider:  o if you are in close contact with someone who has COVID  o or if you develop a fever of 100.4 or greater, sneezing, cough, sore throat, shortness of breath or body aches.

## 2021-08-01 NOTE — Patient Instructions (Signed)
Sydney Reid , Thank you for taking time to come for your Medicare Wellness Visit. I appreciate your ongoing commitment to your health goals. Please review the following plan we discussed and let me know if I can assist you in the future.   Screening recommendations/referrals: Colonoscopy aged out Mammogram aged out Bone Density up to date Recommended yearly ophthalmology/optometry visit for glaucoma screening and checkup Recommended yearly dental visit for hygiene and checkup  Vaccinations: Influenza vaccine recommended at this time. Pneumococcal vaccine up to date Tdap vaccine RECOMMENDED - to get at local pharmacy Shingles vaccine up to date    Advanced directives: on file.  Conditions/risks identified: fall risk, advance age.   Next appointment: awv in 1 year   Preventive Care 80 Years and Older, Female Preventive care refers to lifestyle choices and visits with your health care provider that can promote health and wellness. What does preventive care include? A yearly physical exam. This is also called an annual well check. Dental exams once or twice a year. Routine eye exams. Ask your health care provider how often you should have your eyes checked. Personal lifestyle choices, including: Daily care of your teeth and gums. Regular physical activity. Eating a healthy diet. Avoiding tobacco and drug use. Limiting alcohol use. Practicing safe sex. Taking low-dose aspirin every day. Taking vitamin and mineral supplements as recommended by your health care provider. What happens during an annual well check? The services and screenings done by your health care provider during your annual well check will depend on your age, overall health, lifestyle risk factors, and family history of disease. Counseling  Your health care provider may ask you questions about your: Alcohol use. Tobacco use. Drug use. Emotional well-being. Home and relationship well-being. Sexual  activity. Eating habits. History of falls. Memory and ability to understand (cognition). Work and work Statistician. Reproductive health. Screening  You may have the following tests or measurements: Height, weight, and BMI. Blood pressure. Lipid and cholesterol levels. These may be checked every 5 years, or more frequently if you are over 36 years old. Skin check. Lung cancer screening. You may have this screening every year starting at age 19 if you have a 30-pack-year history of smoking and currently smoke or have quit within the past 15 years. Fecal occult blood test (FOBT) of the stool. You may have this test every year starting at age 23. Flexible sigmoidoscopy or colonoscopy. You may have a sigmoidoscopy every 5 years or a colonoscopy every 10 years starting at age 13. Hepatitis C blood test. Hepatitis B blood test. Sexually transmitted disease (STD) testing. Diabetes screening. This is done by checking your blood sugar (glucose) after you have not eaten for a while (fasting). You may have this done every 1-3 years. Bone density scan. This is done to screen for osteoporosis. You may have this done starting at age 56. Mammogram. This may be done every 1-2 years. Talk to your health care provider about how often you should have regular mammograms. Talk with your health care provider about your test results, treatment options, and if necessary, the need for more tests. Vaccines  Your health care provider may recommend certain vaccines, such as: Influenza vaccine. This is recommended every year. Tetanus, diphtheria, and acellular pertussis (Tdap, Td) vaccine. You may need a Td booster every 10 years. Zoster vaccine. You may need this after age 28. Pneumococcal 13-valent conjugate (PCV13) vaccine. One dose is recommended after age 27. Pneumococcal polysaccharide (PPSV23) vaccine. One dose is recommended after  age 25. Talk to your health care provider about which screenings and vaccines  you need and how often you need them. This information is not intended to replace advice given to you by your health care provider. Make sure you discuss any questions you have with your health care provider. Document Released: 11/09/2015 Document Revised: 07/02/2016 Document Reviewed: 08/14/2015 Elsevier Interactive Patient Education  2017 [employer] Prevention in the Home Falls can cause injuries. They can happen to people of all ages. There are many things you can do to make your home safe and to help prevent falls. What can I do on the outside of my home? Regularly fix the edges of walkways and driveways and fix any cracks. Remove anything that might make you trip as you walk through a door, such as a raised step or threshold. Trim any bushes or trees on the path to your home. Use bright outdoor lighting. Clear any walking paths of anything that might make someone trip, such as rocks or tools. Regularly check to see if handrails are loose or broken. Make sure that both sides of any steps have handrails. Any raised decks and porches should have guardrails on the edges. Have any leaves, snow, or ice cleared regularly. Use sand or salt on walking paths during winter. Clean up any spills in your garage right away. This includes oil or grease spills. What can I do in the bathroom? Use night lights. Install grab bars by the toilet and in the tub and shower. Do not use towel bars as grab bars. Use non-skid mats or decals in the tub or shower. If you need to sit down in the shower, use a plastic, non-slip stool. Keep the floor dry. Clean up any water that spills on the floor as soon as it happens. Remove soap buildup in the tub or shower regularly. Attach bath mats securely with double-sided non-slip rug tape. Do not have throw rugs and other things on the floor that can make you trip. What can I do in the bedroom? Use night lights. Make sure that you have a light by your bed that  is easy to reach. Do not use any sheets or blankets that are too big for your bed. They should not hang down onto the floor. Have a firm chair that has side arms. You can use this for support while you get dressed. Do not have throw rugs and other things on the floor that can make you trip. What can I do in the kitchen? Clean up any spills right away. Avoid walking on wet floors. Keep items that you use a lot in easy-to-reach places. If you need to reach something above you, use a strong step stool that has a grab bar. Keep electrical cords out of the way. Do not use floor polish or wax that makes floors slippery. If you must use wax, use non-skid floor wax. Do not have throw rugs and other things on the floor that can make you trip. What can I do with my stairs? Do not leave any items on the stairs. Make sure that there are handrails on both sides of the stairs and use them. Fix handrails that are broken or loose. Make sure that handrails are as long as the stairways. Check any carpeting to make sure that it is firmly attached to the stairs. Fix any carpet that is loose or worn. Avoid having throw rugs at the top or bottom of the stairs. If you do  have throw rugs, attach them to the floor with carpet tape. Make sure that you have a light switch at the top of the stairs and the bottom of the stairs. If you do not have them, ask someone to add them for you. What else can I do to help prevent falls? Wear shoes that: Do not have high heels. Have rubber bottoms. Are comfortable and fit you well. Are closed at the toe. Do not wear sandals. If you use a stepladder: Make sure that it is fully opened. Do not climb a closed stepladder. Make sure that both sides of the stepladder are locked into place. Ask someone to hold it for you, if possible. Clearly mark and make sure that you can see: Any grab bars or handrails. First and last steps. Where the edge of each step is. Use tools that help you  move around (mobility aids) if they are needed. These include: Canes. Walkers. Scooters. Crutches. Turn on the lights when you go into a dark area. Replace any light bulbs as soon as they burn out. Set up your furniture so you have a clear path. Avoid moving your furniture around. If any of your floors are uneven, fix them. If there are any pets around you, be aware of where they are. Review your medicines with your doctor. Some medicines can make you feel dizzy. This can increase your chance of falling. Ask your doctor what other things that you can do to help prevent falls. This information is not intended to replace advice given to you by your health care provider. Make sure you discuss any questions you have with your health care provider. Document Released: 08/09/2009 Document Revised: 03/20/2016 Document Reviewed: 11/17/2014 Elsevier Interactive Patient Education  2017 [employer].

## 2021-08-01 NOTE — Progress Notes (Signed)
Subjective:   Sydney Reid is a 80 y.o. female who presents for Medicare Annual (Subsequent) preventive examination.  Review of Systems     Cardiac Risk Factors include: advanced age (>19men, >1 women);family history of premature cardiovascular disease;dyslipidemia;hypertension     Objective:    There were no vitals filed for this visit. There is no height or weight on file to calculate BMI.  Advanced Directives 08/01/2021 04/16/2021 03/11/2021 09/10/2020 05/15/2020 02/17/2020 08/17/2019  Does Patient Have a Medical Advance Directive? Yes Yes Yes Yes Yes Yes Yes  Type of Paramedic of [place];Living will [place];Living will [place];Living will;Out of facility DNR (pink MOST or yellow form) [place];Living will Healthcare Power of Opp;Living will -  Does patient want to make changes to medical advance directive? - No - Patient declined No - Patient declined No - Patient declined No - Patient declined No - Patient declined -  Copy of [place] in Chart? No - copy requested No - copy requested No - copy requested No - copy requested No - copy requested No - copy requested -  Would patient like information on creating a medical advance directive? - - - - - - -    Current Medications (verified) Outpatient Encounter Medications as of 08/01/2021  Medication Sig   ammonium lactate (LAC-HYDRIN) 12 % lotion Apply 1 application topically as needed for dry skin.   aspirin EC 81 MG tablet Take 81 mg by mouth daily. Swallow whole.   Cholecalciferol (VITAMIN D3) 250 MCG (10000 UT) capsule Take 4,000 Units by mouth daily. Except none on Saturday and Sunday   etanercept (ENBREL) 50 MG/ML injection Inject 50 mg into the skin every Sunday.    ezetimibe (ZETIA) 10 MG tablet Take 1 tablet (10 mg total) by mouth daily.   HYDROcodone-acetaminophen (NORCO) 5-325 MG  tablet Take 1 tablet by mouth at bedtime.   levothyroxine (SYNTHROID) 88 MCG tablet Take one tablet by mouth once daily 30 minutes before breakfast on an empty stomach for thyroid.   simvastatin (ZOCOR) 10 MG tablet TAKE 1 TABLET BY MOUTH EVERYDAY AT BEDTIME   [DISCONTINUED] metoprolol tartrate (LOPRESSOR) 50 MG tablet Take 1 tablet (50 mg) 2 hours prior to your CT scan (at 8:45AM). Bring the second tablet with you but do not take unless instructed.   No facility-administered encounter medications on file as of 08/01/2021.    Allergies (verified) Codeine and Statins   History: Past Medical History:  Diagnosis Date   H/O total knee replacement 2001   Right   High cholesterol    History of tonsillectomy and adenoidectomy 1946   Psoriatic arthritis ([place]) 1990   Sleep apnea with use of continuous positive airway pressure (CPAP) 2004   Thyroid disease    Past Surgical History:  Procedure Laterality Date   FOOT SURGERY     fused arch in left foot   RIGHT/LEFT HEART CATH AND CORONARY ANGIOGRAPHY N/A 04/16/2021   Procedure: RIGHT/LEFT HEART CATH AND CORONARY ANGIOGRAPHY;  Surgeon: Adrian Prows, MD;  Location: [facility];  Service: Cardiovascular;  Laterality: N/A;   SPINE SURGERY     TOTAL KNEE ARTHROPLASTY Left    Family History  Problem Relation Age of Onset   Heart attack Father    Heart disease Sister    Social History   Socioeconomic History   Marital status: Married    Spouse name: Not on  file   Number of children: Not on file   Years of education: Not on file   Highest education level: Not on file  Occupational History   Not on file  Tobacco Use   Smoking status: Former    Packs/day: 2.00    Years: 18.00    Pack years: 36.00    Types: Cigarettes    Quit date: 65    Years since quitting: 47.7   Smokeless tobacco: Never  Vaping Use   Vaping Use: Never used  Substance and Sexual Activity   Alcohol use: Yes    Alcohol/week: 1.0 - 2.0 standard drink     Types: 1 - 2 Standard drinks or equivalent per week    Comment: Social drinker, couple times a week   Drug use: No   Sexual activity: Not on file  Other Topics Concern   Not on file  Social History Narrative   Diet? Normal      Do you drink/eat things with caffeine? Yes, 1 mt. Dew per day      Marital status?              m                      What year were you married? 1988      Do you live in a house, apartment, assisted living, condo, trailer, etc.? Townhouse      Is it one or more stories? 2 story      How many persons live in your home? 2      Do you have any pets in your home? (please list) no      Current or past profession: Is manager      Do you exercise?     No (did it for 50 years)                       Type & how often?      Do you have a living will? yes      Do you have a DNR form?        yes                          If not, do you want to discuss one?      Do you have signed POA/HPOA for forms?       Social Determinants of Health   Financial Resource Strain: Not on file  Food Insecurity: Not on file  Transportation Needs: Not on file  Physical Activity: Not on file  Stress: Not on file  Social Connections: Not on file    Tobacco Counseling Counseling given: Not Answered   Clinical Intake:  Pre-visit preparation completed: Yes  Pain : 0-10 Pain Location: Back Pain Descriptors / Indicators: Constant Pain Onset: More than a month ago Pain Frequency: Constant Pain Relieving Factors: hydrocodone  Pain Relieving Factors: hydrocodone  BMI - recorded: 23  How often do you need to have someone help you when you read instructions, pamphlets, or other written materials from your doctor or pharmacy?: 1 - Never  Diabetic?no         Activities of Daily Living In your present state of health, do you have any difficulty performing the following activities: 08/01/2021  Hearing? N  Vision? Y  Difficulty concentrating or making decisions? N   Walking or climbing stairs? N  Dressing or bathing?  N  Doing errands, shopping? N  Preparing Food and eating ? N  Using the Toilet? N  In the past six months, have you accidently leaked urine? N  Do you have problems with loss of bowel control? N  Managing your Medications? N  Managing your Finances? N  Housekeeping or managing your Housekeeping? N  Some recent data might be hidden    Patient Care Team: [name], NP as PCP - General (Geriatric Medicine) Tomma Rakers, MD as Referring Physician (Ophthalmology)  Indicate any recent Medical Services you may have received from other than [facility] providers in the past year (date may be approximate).     Assessment:   This is a routine wellness examination for Laryn.  Hearing/Vision screen Hearing Screening - Comments:: Patient has no hearing problems. Vision Screening - Comments:: Patient has fuchs disease. Patient has had eye exam Feb.. Patient sees Dr. Isaiah Blakes  Dietary issues and exercise activities discussed: Current Exercise Habits: The patient does not participate in regular exercise at present, Exercise limited by: cardiac condition(s)   Goals Addressed   None    Depression Screen PHQ 2/9 Scores 08/01/2021 05/15/2020 10/17/2019 05/16/2019 05/02/2019 04/30/2018 12/25/2017  PHQ - 2 Score 0 0 0 0 0 0 0    Fall Risk Fall Risk  08/01/2021 03/11/2021 01/21/2021 09/10/2020 05/15/2020  Falls in the past year? 1 1 1 1 1   Number falls in past yr: 1 0 0 0 1  Injury with Fall? 0 0 0 0 0  Comment - - - - -  Risk for fall due to : History of fall(s) - - - -  Follow up Falls evaluation completed - - - -    FALL RISK PREVENTION PERTAINING TO THE HOME:  Any stairs in or around the home? Yes  If so, are there any without handrails? No  Home free of loose throw rugs in walkways, pet beds, electrical cords, etc? No  Adequate lighting in your home to reduce risk of falls? Yes   ASSISTIVE DEVICES UTILIZED TO PREVENT  FALLS:  Life alert? No  Use of a cane, walker or w/c? No  Grab bars in the bathroom? Yes  Shower chair or bench in shower? Yes  Elevated toilet seat or a handicapped toilet? Yes   TIMED UP AND GO:  Was the test performed? No .    Cognitive Function: MMSE - Mini Mental State Exam 05/02/2019 04/30/2018 04/09/2017  Orientation to time 5 5 5   Orientation to Place 5 5 5   Registration 3 3 3   Attention/ Calculation 5 5 5   Recall 3 3 3   Language- name 2 objects 2 2 2   Language- repeat 1 1 1   Language- follow 3 step command 3 3 3   Language- read & follow direction 1 1 1   Write a sentence 1 1 1   Copy design 1 1 1   Total score 30 30 30      6CIT Screen 08/01/2021 05/15/2020  What Year? 0 points 0 points  What month? 0 points 0 points  What time? 0 points 0 points  Count back from 20 0 points 0 points  Months in reverse 0 points 0 points  Repeat phrase 0 points 0 points  Total Score 0 0    Immunizations Immunization History  Administered Date(s) Administered   Fluad Quad(high Dose 65+) 09/10/2020   Influenza, High Dose Seasonal PF 07/20/2019   Influenza,inj,Quad PF,6+ Mos 07/08/2017   Influenza-Unspecified 09/13/2012, 11/09/2014, 09/26/2018   PFIZER Comirnaty(Gray Top)Covid-19  Tri-Sucrose Vaccine 01/28/2021   PFIZER(Purple Top)SARS-COV-2 Vaccination 11/13/2019, 12/04/2019, 06/21/2020   Pneumococcal Conjugate-13 11/23/2014   Pneumococcal Polysaccharide-23 10/27/2005   Tdap 10/27/2009   Zoster Recombinat (Shingrix) 12/12/2018, 04/04/2019    TDAP status: Due, Education has been provided regarding the importance of this vaccine. Advised may receive this vaccine at local pharmacy or Health Dept. Aware to provide a copy of the vaccination record if obtained from local pharmacy or Health Dept. Verbalized acceptance and understanding.  Flu Vaccine status: Due, Education has been provided regarding the importance of this vaccine. Advised may receive this vaccine at local pharmacy or Health  Dept. Aware to provide a copy of the vaccination record if obtained from local pharmacy or Health Dept. Verbalized acceptance and understanding.  Pneumococcal vaccine status: Up to date  Covid-19 vaccine status: Information provided on how to obtain vaccines.   Qualifies for Shingles Vaccine? Yes   Zostavax completed No   Shingrix Completed?: Yes  Screening Tests Health Maintenance  Topic Date Due   TETANUS/TDAP  10/28/2019   INFLUENZA VACCINE  05/27/2021   COVID-19 Vaccine (5 - Booster for Pfizer series) 05/30/2021   DEXA SCAN  Completed   Zoster Vaccines- Shingrix  Completed   HPV VACCINES  Aged Out    Health Maintenance  Health Maintenance Due  Topic Date Due   TETANUS/TDAP  10/28/2019   INFLUENZA VACCINE  05/27/2021   COVID-19 Vaccine (5 - Booster for Pfizer series) 05/30/2021    Colorectal cancer screening: No longer required.   Mammogram status: No longer required due to aged out.  Bone Density status: Completed 2022. Results reflect: Bone density results: OSTEOPENIA. Repeat every 2 years.  Lung Cancer Screening: (Low Dose CT Chest recommended if Age 75-80 years, 30 pack-year currently smoking OR have quit w/in 15years.) does not qualify.   Lung Cancer Screening Referral: na  Additional Screening:  Hepatitis C Screening: does not qualify;   Vision Screening: Recommended annual ophthalmology exams for early detection of glaucoma and other disorders of the eye. Is the patient up to date with their annual eye exam?  Yes  Who is the provider or what is the name of the office in which the patient attends annual eye exams? [facility] If pt is not established with a provider, would they like to be referred to a provider to establish care? No .   Dental Screening: Recommended annual dental exams for proper oral hygiene  Community Resource Referral / Chronic Care Management: CRR required this visit?  No   CCM required this visit?  No      Plan:     I have  personally reviewed and noted the following in the patient's chart:   Medical and social history Use of alcohol, tobacco or illicit drugs  Current medications and supplements including opioid prescriptions.  Functional ability and status Nutritional status Physical activity Advanced directives List of other physicians Hospitalizations, surgeries, and ER visits in previous 12 months Vitals Screenings to include cognitive, depression, and falls Referrals and appointments  In addition, I have reviewed and discussed with patient certain preventive protocols, quality metrics, and best practice recommendations. A written personalized care plan for preventive services as well as general preventive health recommendations were provided to patient.     [name], NP   08/01/2021    Virtual Visit via Telephone Note  I connected withNAME@ on 08/01/21 at  2:45 PM EDT by telephone and verified that I am speaking with the correct person using two identifiers.  Location: Patient: home Provider: [facility]    I discussed the limitations, risks, security and privacy concerns of performing an evaluation and management service by telephone and the availability of in person appointments. I also discussed with the patient that there may be a patient responsible charge related to this service. The patient expressed understanding and agreed to proceed.   I discussed the assessment and treatment plan with the patient. The patient was provided an opportunity to ask questions and all were answered. The patient agreed with the plan and demonstrated an understanding of the instructions.   The patient was advised to call back or seek an in-person evaluation if the symptoms worsen or if the condition fails to improve as anticipated.  I provided 18 minutes of non-face-to-face time during this encounter.  Carlos American. Harle Battiest Avs printed and mailed

## 2021-08-01 NOTE — Progress Notes (Signed)
This service is provided via telemedicine  No vital signs collected/recorded due to the encounter was a telemedicine visit.   Location of patient (ex: home, work):  Home  Patient consents to a telephone visit:  Yes, see encounter dated 05/27/2021  Location of the provider (ex: office, home):  [place]  Name of any referring provider:  N/A  Names of all persons participating in the telemedicine service and their role in the encounter:  Sherrie Mustache, Nurse Practitioner, Carroll Kinds, CMA, and patient.   Time spent on call:  11 minutes with medical assistant

## 2021-08-02 ENCOUNTER — Encounter (HOSPITAL_COMMUNITY)
Admission: RE | Admit: 2021-08-02 | Discharge: 2021-08-02 | Disposition: A | Discharge status: Home / Self Care | Payer: Medicare Other | Source: Ambulatory Visit | Attending: Cardiovascular Disease | Admitting: Cardiovascular Disease

## 2021-08-02 ENCOUNTER — Encounter (HOSPITAL_COMMUNITY)
Admission: RE | Admit: 2021-08-02 | Discharge: 2021-08-02 | Disposition: A | Discharge status: Home / Self Care | Payer: Medicare Other | Source: Ambulatory Visit | Attending: Physician Assistant | Admitting: Physician Assistant

## 2021-08-02 ENCOUNTER — Encounter: Payer: Self-pay | Admitting: Physical Therapy

## 2021-08-02 ENCOUNTER — Other Ambulatory Visit: Payer: Self-pay

## 2021-08-02 ENCOUNTER — Ambulatory Visit: Payer: Medicare Other | Attending: Physician Assistant | Admitting: Physical Therapy

## 2021-08-02 ENCOUNTER — Encounter (HOSPITAL_COMMUNITY): Payer: Self-pay

## 2021-08-02 DIAGNOSIS — Z79899 Other long term (current) drug therapy: Secondary | ICD-10-CM | POA: Insufficient documentation

## 2021-08-02 DIAGNOSIS — I2510 Atherosclerotic heart disease of native coronary artery without angina pectoris: Secondary | ICD-10-CM | POA: Diagnosis not present

## 2021-08-02 DIAGNOSIS — R2689 Other abnormalities of gait and mobility: Secondary | ICD-10-CM | POA: Insufficient documentation

## 2021-08-02 DIAGNOSIS — I350 Nonrheumatic aortic (valve) stenosis: Secondary | ICD-10-CM | POA: Diagnosis not present

## 2021-08-02 DIAGNOSIS — Z7982 Long term (current) use of aspirin: Secondary | ICD-10-CM | POA: Diagnosis not present

## 2021-08-02 DIAGNOSIS — Z01818 Encounter for other preprocedural examination: Secondary | ICD-10-CM | POA: Insufficient documentation

## 2021-08-02 DIAGNOSIS — Z01811 Encounter for preprocedural respiratory examination: Secondary | ICD-10-CM

## 2021-08-02 DIAGNOSIS — I700 Atherosclerosis of aorta: Secondary | ICD-10-CM | POA: Diagnosis not present

## 2021-08-02 HISTORY — DX: Other complications of anesthesia, initial encounter: T88.59XA

## 2021-08-02 HISTORY — DX: Failed or difficult intubation, initial encounter: T88.4XXA

## 2021-08-02 LAB — BLOOD GAS, ARTERIAL
Acid-base deficit: 2.7 mmol/L — ABNORMAL HIGH (ref 0.0–2.0)
Bicarbonate: 21.4 mmol/L (ref 20.0–28.0)
Drawn by: 602861
FIO2: 21
O2 Saturation: 98.3 %
Patient temperature: 37
pCO2 arterial: 35.4 mmHg (ref 32.0–48.0)
pH, Arterial: 7.398 (ref 7.350–7.450)
pO2, Arterial: 118 mmHg — ABNORMAL HIGH (ref 83.0–108.0)

## 2021-08-02 LAB — CBC
HCT: 46.6 % — ABNORMAL HIGH (ref 36.0–46.0)
Hemoglobin: 14.6 g/dL (ref 12.0–15.0)
MCH: 26 pg (ref 26.0–34.0)
MCHC: 31.3 g/dL (ref 30.0–36.0)
MCV: 82.9 fL (ref 80.0–100.0)
Platelets: 254 10*3/uL (ref 150–400)
RBC: 5.62 MIL/uL — ABNORMAL HIGH (ref 3.87–5.11)
RDW: 15.3 % (ref 11.5–15.5)
WBC: 7.3 10*3/uL (ref 4.0–10.5)
nRBC: 0 % (ref 0.0–0.2)

## 2021-08-02 LAB — URINALYSIS, ROUTINE W REFLEX MICROSCOPIC
Bacteria, UA: NONE SEEN
Bilirubin Urine: NEGATIVE
Glucose, UA: NEGATIVE mg/dL
Ketones, ur: NEGATIVE mg/dL
Leukocytes,Ua: NEGATIVE
Nitrite: NEGATIVE
Protein, ur: NEGATIVE mg/dL
Specific Gravity, Urine: 1.009 (ref 1.005–1.030)
pH: 5 (ref 5.0–8.0)

## 2021-08-02 LAB — TYPE AND SCREEN
ABO/RH(D): A POS
Antibody Screen: NEGATIVE

## 2021-08-02 LAB — COMPREHENSIVE METABOLIC PANEL
ALT: 23 U/L (ref 0–44)
AST: 29 U/L (ref 15–41)
Albumin: 4 g/dL (ref 3.5–5.0)
Alkaline Phosphatase: 59 U/L (ref 38–126)
Anion gap: 8 (ref 5–15)
BUN: 14 mg/dL (ref 8–23)
CO2: 21 mmol/L — ABNORMAL LOW (ref 22–32)
Calcium: 9.3 mg/dL (ref 8.9–10.3)
Chloride: 109 mmol/L (ref 98–111)
Creatinine, Ser: 0.88 mg/dL (ref 0.44–1.00)
GFR, Estimated: >60 mL/min (ref >60)
Glucose, Bld: 97 mg/dL (ref 70–99)
Potassium: 4.6 mmol/L (ref 3.5–5.1)
Sodium: 138 mmol/L (ref 135–145)
Total Bilirubin: 0.9 mg/dL (ref 0.3–1.2)
Total Protein: 7.8 g/dL (ref 6.5–8.1)

## 2021-08-02 LAB — PROTIME-INR
INR: 0.9 (ref 0.8–1.2)
Prothrombin Time: 12.5 seconds (ref 11.4–15.2)

## 2021-08-02 LAB — SURGICAL PCR SCREEN
MRSA, PCR: NEGATIVE
Staphylococcus aureus: NEGATIVE

## 2021-08-02 IMAGING — CR DG CHEST 2V
2 series · 2 of 2 positions shown · non-contrast
Comparison: No priors.

CLINICAL DATA: 80-year-old female under preoperative evaluation.

EXAM:
CHEST - 2 VIEW

[w chest pa]
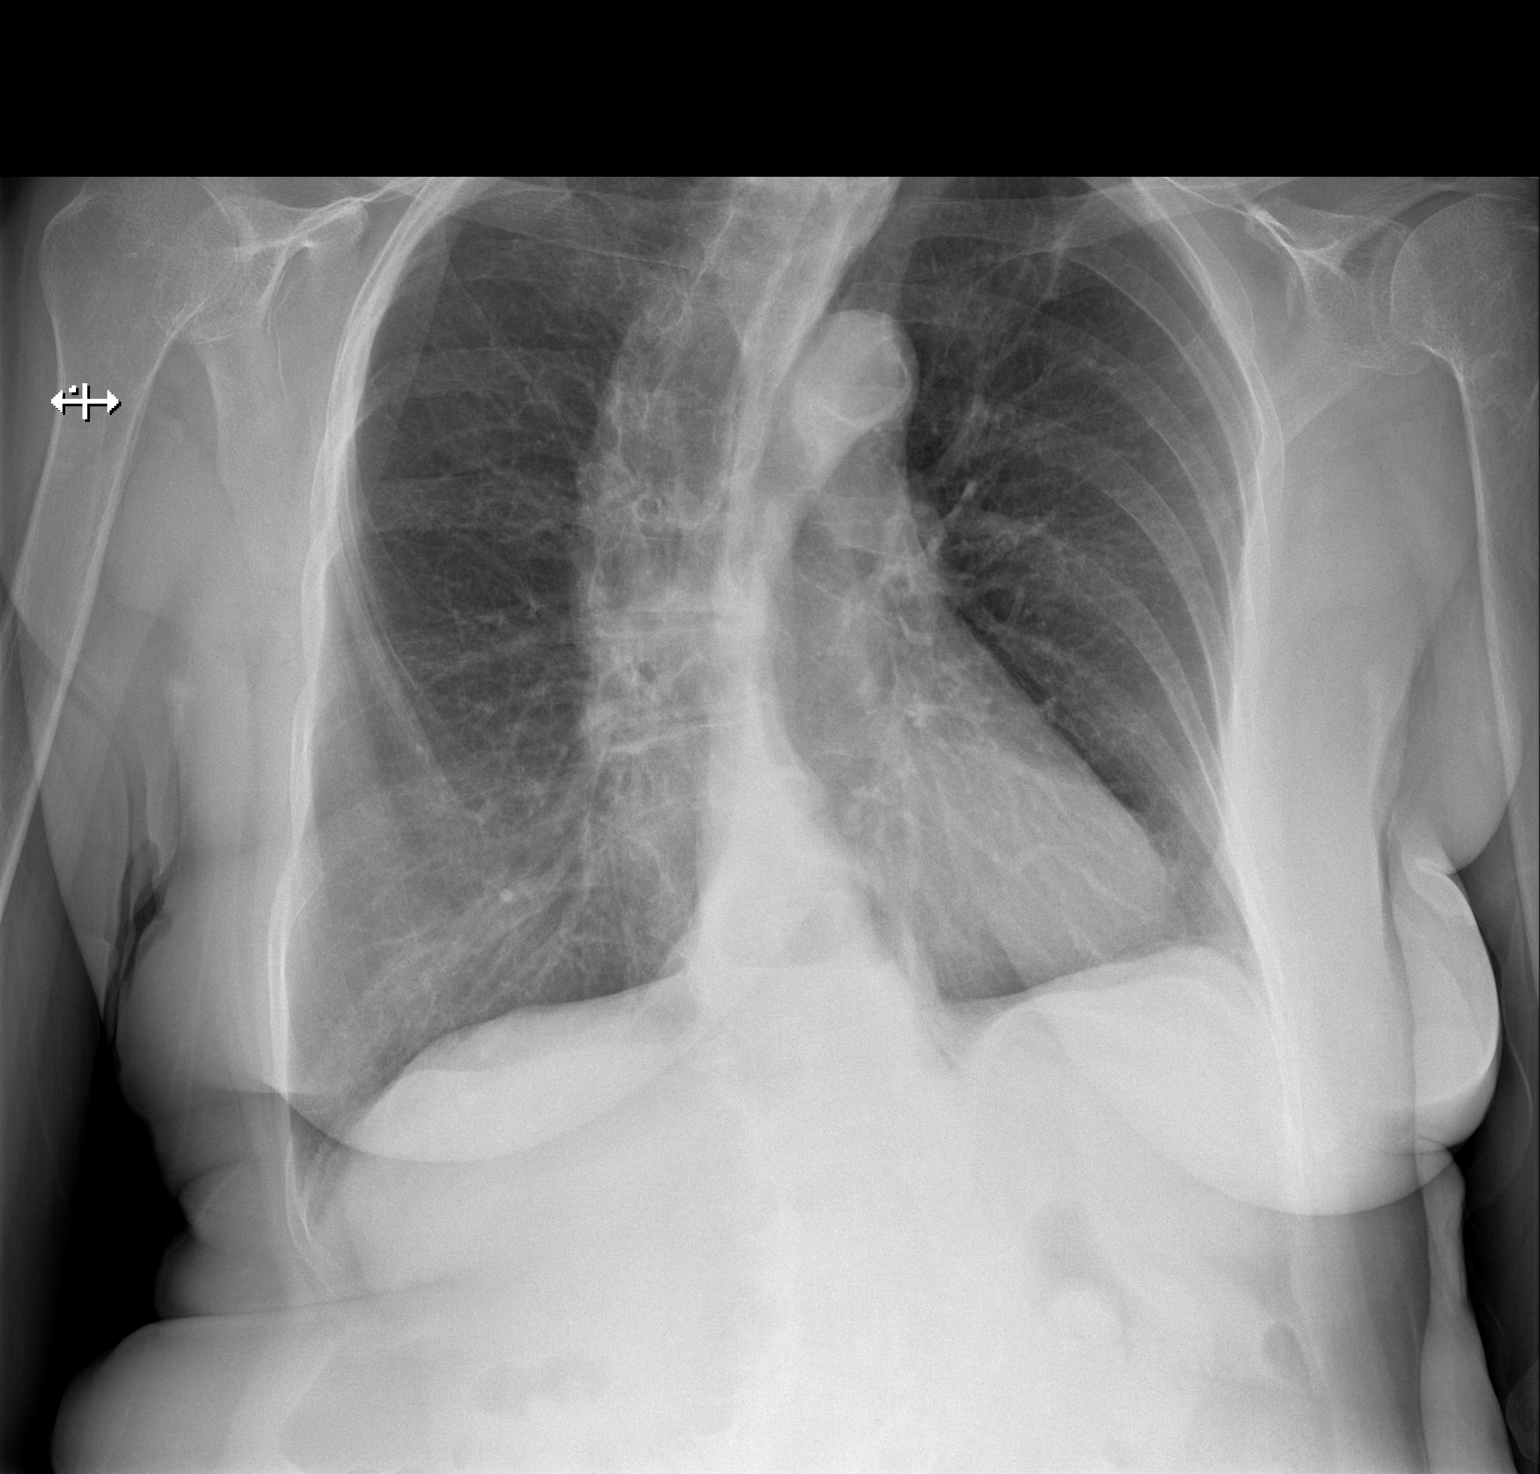

[w chest lat]
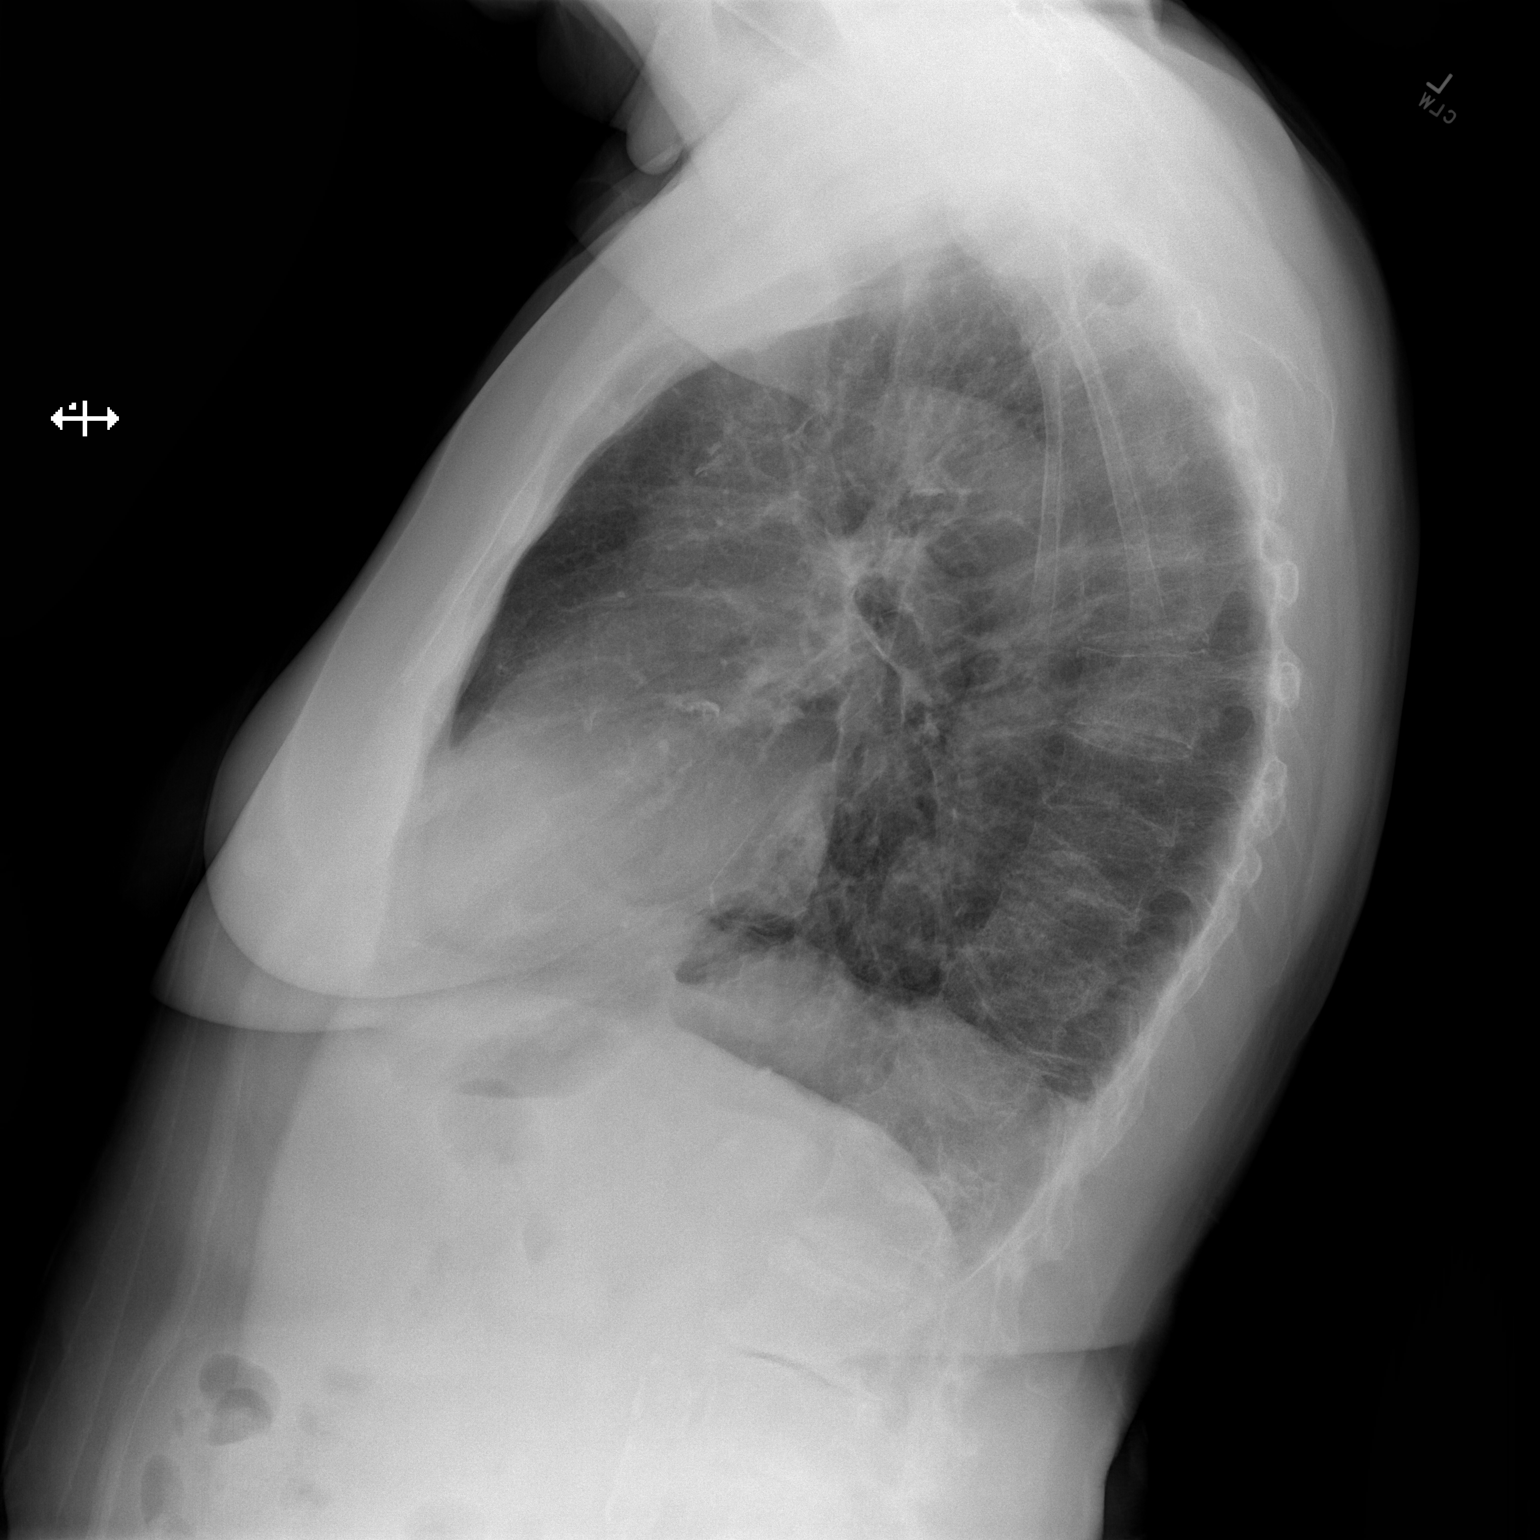

[2 of 2 positions shown; findings below may reference images not displayed]

FINDINGS: Lung volumes are normal. No consolidative airspace disease. No
pleural effusions. No pneumothorax. No pulmonary nodule or mass
noted. Pulmonary vasculature and the cardiomediastinal silhouette
are within normal limits. Atherosclerosis in the thoracic aorta.
IMPRESSION: 1.  No radiographic evidence of acute cardiopulmonary disease.
2. Aortic atherosclerosis.

## 2021-08-02 NOTE — Progress Notes (Addendum)
PCP - Sherrie Mustache, NP Cardiologist - Dr. Adrian Prows  PPM/ICD - Denies  Chest x-ray - 08/02/21 EKG - 08/02/21 Stress Test - denies ECHO - 02/19/21 Cardiac Cath - 04/16/21  Sleep Study - Yes, pt has OSA and uses CPAP  Patient denies having diabetes.  Blood Thinner Instructions: N/A Aspirin Instructions: Continue all medications to the day before surgery (08/05/21). No not take the day of surgery.  ERAS Protcol - No   COVID TEST- N/A; Positive covid result from [facility] on 05/31/21. Patient still within 90 day window.   Anesthesia review: Yes, cardiac hx; difficult intubation per patient.  Patient denies shortness of breath, fever, cough and chest pain at PAT appointment   All instructions explained to the patient, with a verbal understanding of the material. Patient agrees to go over the instructions while at home for a better understanding. Patient also instructed to self quarantine after being tested for COVID-19. The opportunity to ask questions was provided.

## 2021-08-02 NOTE — Therapy (Signed)
[place], [place], [address] Phone: [phone]   Fax:  [phone]  Physical Therapy Evaluation  Patient Details  Name: Sydney Reid MRN: [id number] Date of Birth: December 18, 1940 Referring Provider (PT): Sydney Reid, [place]   Encounter Date: 08/02/2021   PT End of Session - 08/02/21 1107     Visit Number 1    Number of Visits 1    Date for PT Re-Evaluation 08/02/21    PT Start Time 1104    PT Stop Time 1130    PT Time Calculation (min) 26 min    Activity Tolerance Patient tolerated treatment well    Behavior During Therapy [name] for tasks assessed/performed             Past Medical History:  Diagnosis Date   H/O total knee replacement 2001   Right   High cholesterol    History of tonsillectomy and adenoidectomy 1946   Psoriatic arthritis ([place]) 1990   Sleep apnea with use of continuous positive airway pressure (CPAP) 2004   Thyroid disease     Past Surgical History:  Procedure Laterality Date   FOOT SURGERY     fused arch in left foot   RIGHT/LEFT HEART CATH AND CORONARY ANGIOGRAPHY N/A 04/16/2021   Procedure: RIGHT/LEFT HEART CATH AND CORONARY ANGIOGRAPHY;  Surgeon: Adrian Prows, MD;  Location: [facility] [facility];  Service: Cardiovascular;  Laterality: N/A;   SPINE SURGERY     TOTAL KNEE ARTHROPLASTY Left     There were no vitals filed for this visit.    Subjective Assessment - 08/02/21 1108     Subjective pt is a 80 y.o with CC of dizziness that occurred with bending forward for a long time but reports the symptoms worsened in March / April 2022. she reports having increased SOB that is more present for 3 months. she denies any other pain    Patient Stated Goals to fix heart    Currently in Pain? No/denies                [facility] PT Assessment - 08/02/21 0001       Assessment   Medical Diagnosis Severe Aortic Stenosis    Referring Provider (PT) Sydney Stanford, PA-C     Onset Date/Surgical Date --   for a few years   Hand Dominance Right      Precautions   Precautions None      Restrictions   Weight Bearing Restrictions No      Balance Screen   Has the patient fallen in the past 6 months Yes    How many times? 1    Has the patient had a decrease in activity level because of a fear of falling?  No    Is the patient reluctant to leave their home because of a fear of falling?  No      Home Environment   Living Environment Private residence    Living Arrangements Spouse/significant other    Available Help at Discharge Family    Type of [place] to enter    Entrance Stairs-Number of Steps 3    Entrance Stairs-Rails Right   ascending   Home Layout Two level    Alternate Level Stairs-Number of Steps 18    Alternate Level Stairs-Rails Left   ascending   Home Equipment Bedside commode;Walker - 2 wheels;Cane - single point  ROM / Strength   AROM / PROM / Strength AROM;Strength      AROM   Overall AROM  Within functional limits for tasks performed      Strength   Overall Strength Within functional limits for tasks performed    Overall Strength Comments unable to assess grip strength      Ambulation/Gait   Ambulation/Gait Yes    Gait Pattern Step-through pattern;Decreased stride length;Antalgic;Trendelenburg    Gait Comments with intermittent abarrent lateral step with the RLE              [facility] Pre-Surgical Assessment - 08/02/21 0001     5 Meter Walk Test- trial 1 4 sec    5 Meter Walk Test- trial 2 3 sec.     5 Meter Walk Test- trial 3 4 sec.    5 meter walk test average 3.67 sec    4 Stage Balance Test tolerated for:  8 sec.    4 Stage Balance Test Position 3    comment required HHA getting into position    Sit To Stand Test- trial 1 11 sec.    ADL/IADL Independent with: Bathing;Dressing;Meal prep    ADL/IADL Needs Assistance with: Finances;Yard work    ADL/IADL Therapist, sports Index Midly frail     6 Minute Walk- Baseline yes    BP (mmHg) 148/94    HR (bpm) 95    02 Sat (%RA) 99 %    Modified Borg Scale for Dyspnea 0.5- Very, very slight shortness of breath    Perceived Rate of Exertion (Borg) 6-    6 Minute Walk Post Test yes    BP (mmHg) 156/89    HR (bpm) 99    02 Sat (%RA) 135 %    Aerobic Endurance Distance Walked 740    Endurance additional comments pt is 81.00% limited compared to age related norm                      Objective measurements completed on examination: See above findings.                             Plan - 08/02/21 1135     Clinical Impression Statement see assessment in note    Stability/Clinical Decision Making Stable/Uncomplicated    Clinical Decision Making Low    PT Frequency One time visit    PT Next Visit Plan PRE TAVR evaluation    Consulted and Agree with Plan of Care Patient               Clinical Impression Statement: Pt is a 80 yo F presenting to OP PT for evaluation prior to possible TAVR surgery due to severe aortic stenosis. Pt reports onset of dizziness for a couple of years with worsening 4 months ago, and SOB starting approximately 3 months ago. Symptoms are limiting prolonged walking/ standing. Pt presents with good ROM and strength, fair balance and is assessed as low at high fall risk 4 stage balance test, good walking speed and limited aerobic endurance per 6 minute walk test. Pt ambulated 740 feet in 3:32 before requesting a seated rest beak lasting remainder of the test. At time of rest, patient's HR was 135 bpm and O2 was 99% on room air. Pt reported 7/10 shortness of breath on modified scale for dyspnea. Pt ambulated a total of 740 feet in 6 minute walk. Dizziness and SOB increased significantly with 6  minute walk test. Based on the Short Physical Performance Battery, patient has a frailty rating of 11/12 with </= 5/12 considered frail.   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 [place], [place], [address] Phone: [phone]   Fax:  [phone]  Name: Sydney Reid MRN: [id number] Date of Birth: December 18, 1940

## 2021-08-05 MED ORDER — CEFAZOLIN SODIUM-DEXTROSE 2-4 GM/100ML-% IV SOLN
2.0000 g | INTRAVENOUS | Status: AC
  Administered 2021-08-06: 2 g via INTRAVENOUS
  Filled 2021-08-05: qty 100

## 2021-08-05 MED ORDER — POTASSIUM CHLORIDE 2 MEQ/ML IV SOLN
80.0000 meq | INTRAVENOUS | Status: DC
  Filled 2021-08-05: qty 40

## 2021-08-05 MED ORDER — NOREPINEPHRINE 4 MG/250ML-% IV SOLN
0.0000 ug/min | INTRAVENOUS | Status: AC
  Administered 2021-08-06: 2 ug/min via INTRAVENOUS
  Filled 2021-08-05: qty 250

## 2021-08-05 MED ORDER — MAGNESIUM SULFATE 50 % IJ SOLN
40.0000 meq | INTRAMUSCULAR | Status: DC
  Filled 2021-08-05: qty 9.85

## 2021-08-05 MED ORDER — HEPARIN 30,000 UNITS/1000 ML (OHS) CELLSAVER SOLUTION
Status: DC
  Filled 2021-08-05: qty 1000

## 2021-08-05 MED ORDER — DEXMEDETOMIDINE HCL IN NACL 400 MCG/100ML IV SOLN
0.1000 ug/kg/h | INTRAVENOUS | Status: AC
  Administered 2021-08-06: .5 ug/kg/h via INTRAVENOUS
  Administered 2021-08-06: 66.52 ug via INTRAVENOUS
  Filled 2021-08-05: qty 100

## 2021-08-05 NOTE — Anesthesia Preprocedure Evaluation (Addendum)
Anesthesia Evaluation  Patient identified by MRN, date of birth, ID band Patient awake    Reviewed: Allergy & Precautions, NPO status , Patient's Chart, lab work & pertinent test results  History of Anesthesia Complications (+) DIFFICULT AIRWAY and history of anesthetic complications  Airway Mallampati: IV  TM Distance: <3 FB Neck ROM: Limited  Mouth opening: Limited Mouth Opening  Dental  (+) Teeth Intact, Dental Advisory Given   Pulmonary sleep apnea , former smoker,    breath sounds clear to auscultation       Cardiovascular + Valvular Problems/Murmurs AS  Rhythm:Regular + Systolic murmurs 1. Left ventricular ejection fraction, by estimation, is 60 to 65%. The  left ventricle has normal function. The left ventricle has no regional  wall motion abnormalities. There is mild left ventricular hypertrophy of  the basal-septal segment. Left  ventricular diastolic parameters are consistent with Grade I diastolic  dysfunction (impaired relaxation).  2. Right ventricular systolic function is normal. The right ventricular  size is normal. There is normal pulmonary artery systolic pressure. The  estimated right ventricular systolic pressure is 50.5 mmHg.  3. The mitral valve is normal in structure. No evidence of mitral valve  regurgitation. No evidence of mitral stenosis.  4. The aortic valve is calcified. There is severe calcifcation of the  aortic valve. There is severe thickening of the aortic valve. Aortic valve  regurgitation is not visualized. Severe aortic valve stenosis. Aortic  valve area, by VTI measures 0.72 cm.  Aortic valve mean gradient measures 41.0 mmHg. Aortic valve Vmax measures  3.97 m/s.  5. The inferior vena cava is normal in size with greater than 50%  respiratory variability, suggesting right atrial pressure of 3 mmHg.   Right and left heart catheterization 04/16/2021: Hemodynamic data: RA 2/1, mean 0 mmHg.   RV 24/-2, EDP 1 mmHg.  PA 24/6, mean 14 mmHg.  PW 9/4, mean 5 mmHg. PA saturation 78%, aortic saturation 100%.  Cardiac output by Fick was 5.16 with a cardiac index of 2.98. LV: 177/2, EDP 13 mmHg.  Aorta: 137/67, mean 95 mmHg. Peak to peak gradient 33 with a mean gradient of 29.4 mmHg.  Calculated aortic valve area 0.88 cm. Angiographic data: Left main: Mildly calcified with mild luminal irregularity. CX: Large vessel, giving origin to small marginals and continues in the AV groove.  Proximal CX has calcified 60 to 70% stenosis.  OM 3 is the largest vessel with a proximal 80% calcific stenosis.  OM 1 and 2 are very small. LAD: Large-caliber vessel, mild diffuse disease, mild calcification is evident in the proximal and mid segment.  D1 and D2 are moderate sized without significant disease. RCA: Large-caliber vessel with mild diffuse disease and mild coronary calcification. Rec: Patient has severe aortic stenosis by echocardiogram with a mean gradient >40 mmHg.  She now has severe aortic stenosis with symptomatic shortness of breath and aortic stenosis related angina pectoris at night, no exertional chest pain.  Although she has significant stenosis in the circumflex, do not think she needs intervention to the circumflex.  She will benefit from TAVR evaluation.  50 mL contrast utilized.    Neuro/Psych negative neurological ROS  negative psych ROS   GI/Hepatic negative GI ROS, Neg liver ROS,   Endo/Other  Hypothyroidism   Renal/GU negative Renal ROS     Musculoskeletal  (+) Arthritis ,   Abdominal   Peds  Hematology negative hematology ROS (+)   Anesthesia Other Findings   Reproductive/Obstetrics  Anesthesia Physical Anesthesia Plan  ASA: 4  Anesthesia Plan: MAC   Post-op Pain Management:    Induction:   PONV Risk Score and Plan: 2 and Ondansetron, Propofol infusion and Treatment may vary due to age or medical  condition  Airway Management Planned: Nasal Cannula  Additional Equipment: Arterial line  Intra-op Plan:   Post-operative Plan:   Informed Consent: I have reviewed the patients History and Physical, chart, labs and discussed the procedure including the risks, benefits and alternatives for the proposed anesthesia with the patient or authorized representative who has indicated his/her understanding and acceptance.     Dental advisory given  Plan Discussed with: CRNA and Anesthesiologist  Anesthesia Plan Comments: (See APP note by Durel Salts, FNP )      Anesthesia Quick Evaluation

## 2021-08-05 NOTE — Progress Notes (Signed)
Anesthesia Chart Review:   Case: [id number] Date/Time: 08/06/21 1030   Procedures:      TRANSCATHETER AORTIC VALVE REPLACEMENT, TRANSFEMORAL with possible Shockwave lithotripsy (Chest)     TRANSESOPHAGEAL ECHOCARDIOGRAM (TEE)     LOWER EXTREMITY ANGIOGRAM     AORTOGRAM   Anesthesia type: General   Pre-op diagnosis: AO   Location: [facility] OR ROOM 35 / [place] OR   Surgeons: Sherren Mocha, MD       DISCUSSION: Pt is 80 years old with hx CAD (mild diffuse calcification/disease except proximal CX 60-70%, OM3 80% - in the absence of angina, medical management recommended), severe aortic stenosis, psoriatic arthritis  Pt to hold enbrel 2 weeks before surgery.   See note 07/25/21 by Gilford Raid, MD for complete H&P and diagnostic testing results.    Anesthesia history includes difficult intubation "20-25 years ago." Anesthesia records from [place] (care everywhere) from 2015 eye surgeries document airway as below:  12/14/13:  Airway/HEENT/Dental: Mallampati score: IV Thyromental distance is >3 FB finger breadths. Mouth opening: normal and >2 FB Neck ROM is full. Additional airway comments: Limited extension and lateral to 45 degrees  Pt then has 4 subsequent eye surgeries and last airway assessment is below:  08/15/14:  Airway/HEENT/Dental: Mallampati score: I Thyromental distance is >3 FB finger breadths. Mouth opening: normal and >2 FB Neck ROM is full.    VS: BP 130/86   Pulse 91   Temp 36.7 C (Oral)   Resp 18   Ht 5\' 5"  (1.651 m)   Wt 66.5 kg   SpO2 100%   BMI 24.38 kg/m   PROVIDERS: - PCP is [name], NP - Cardiologist is Adrian Prows, MD   LABS: Labs reviewed: Acceptable for surgery. (all labs ordered are listed, but only abnormal results are displayed)  Labs Reviewed  CBC - Abnormal; Notable for the following components:      Result Value   RBC 5.62 (*)    HCT 46.6 (*)    All other components within normal limits  COMPREHENSIVE METABOLIC PANEL - Abnormal;  Notable for the following components:   CO2 21 (*)    All other components within normal limits  URINALYSIS, ROUTINE W REFLEX MICROSCOPIC - Abnormal; Notable for the following components:   Color, Urine STRAW (*)    Hgb urine dipstick SMALL (*)    All other components within normal limits  BLOOD GAS, ARTERIAL - Abnormal; Notable for the following components:   pO2, Arterial 118 (*)    Acid-base deficit 2.7 (*)    All other components within normal limits  SURGICAL PCR SCREEN  PROTIME-INR  TYPE AND SCREEN    Past Medical History:  Diagnosis Date   Complication of anesthesia    Difficult intubation    20-25 years ago   H/O total knee replacement 10/28/1999   Right   High cholesterol    History of tonsillectomy and adenoidectomy 10/27/1944   Psoriatic arthritis ([place]) 10/27/1988   Sleep apnea with use of continuous positive airway pressure (CPAP) 10/27/2002   Thyroid disease     Past Surgical History:  Procedure Laterality Date   CATARACT EXTRACTION W/ INTRAOCULAR LENS IMPLANT Bilateral    FOOT SURGERY     fused arch in left foot   RIGHT/LEFT HEART CATH AND CORONARY ANGIOGRAPHY N/A 04/16/2021   Procedure: RIGHT/LEFT HEART CATH AND CORONARY ANGIOGRAPHY;  Surgeon: Adrian Prows, MD;  Location: [facility];  Service: Cardiovascular;  Laterality: N/A;   SPINE SURGERY  TONSILLECTOMY     TOTAL KNEE ARTHROPLASTY Left     MEDICATIONS:  ammonium lactate (LAC-HYDRIN) 12 % lotion   aspirin EC 81 MG tablet   Cholecalciferol (VITAMIN D3) 250 MCG (10000 UT) capsule   etanercept (ENBREL) 50 MG/ML injection   ezetimibe (ZETIA) 10 MG tablet   HYDROcodone-acetaminophen (NORCO) 5-325 MG tablet   levothyroxine (SYNTHROID) 88 MCG tablet   simvastatin (ZOCOR) 10 MG tablet   No current facility-administered medications for this encounter.    Pt airway will need further assessment by assigned anesthesiologist day of surgery.   Willeen Cass, PhD, FNP-BC [facility] Short Stay Surgical  Center/Anesthesiology Phone: [phone] 08/05/2021 9:41 AM

## 2021-08-06 ENCOUNTER — Other Ambulatory Visit: Payer: Self-pay | Admitting: Cardiology

## 2021-08-06 ENCOUNTER — Other Ambulatory Visit: Payer: Self-pay

## 2021-08-06 ENCOUNTER — Inpatient Hospital Stay (HOSPITAL_COMMUNITY): Payer: Medicare Other | Admitting: Anesthesiology

## 2021-08-06 ENCOUNTER — Encounter (HOSPITAL_COMMUNITY)
Admission: RE | Disposition: A | Discharge status: Home / Self Care | Payer: Medicare Other | Source: Home / Self Care | Attending: Cardiovascular Disease

## 2021-08-06 ENCOUNTER — Inpatient Hospital Stay (HOSPITAL_COMMUNITY): Payer: Medicare Other

## 2021-08-06 ENCOUNTER — Encounter (HOSPITAL_COMMUNITY): Payer: Self-pay | Admitting: Cardiovascular Disease

## 2021-08-06 ENCOUNTER — Inpatient Hospital Stay (HOSPITAL_COMMUNITY)
Admission: RE | Admit: 2021-08-06 | Discharge: 2021-08-07 | DRG: 267 | Disposition: A | Discharge status: Home / Self Care | Payer: Medicare Other | Attending: Cardiovascular Disease | Admitting: Cardiovascular Disease

## 2021-08-06 DIAGNOSIS — I70202 Unspecified atherosclerosis of native arteries of extremities, left leg: Secondary | ICD-10-CM | POA: Diagnosis not present

## 2021-08-06 DIAGNOSIS — Z8249 Family history of ischemic heart disease and other diseases of the circulatory system: Secondary | ICD-10-CM | POA: Diagnosis not present

## 2021-08-06 DIAGNOSIS — E785 Hyperlipidemia, unspecified: Secondary | ICD-10-CM | POA: Diagnosis present

## 2021-08-06 DIAGNOSIS — Z79890 Hormone replacement therapy (postmenopausal): Secondary | ICD-10-CM | POA: Diagnosis not present

## 2021-08-06 DIAGNOSIS — Z7982 Long term (current) use of aspirin: Secondary | ICD-10-CM | POA: Diagnosis not present

## 2021-08-06 DIAGNOSIS — I2510 Atherosclerotic heart disease of native coronary artery without angina pectoris: Secondary | ICD-10-CM | POA: Diagnosis present

## 2021-08-06 DIAGNOSIS — Z952 Presence of prosthetic heart valve: Secondary | ICD-10-CM | POA: Diagnosis not present

## 2021-08-06 DIAGNOSIS — Z79899 Other long term (current) drug therapy: Secondary | ICD-10-CM

## 2021-08-06 DIAGNOSIS — E034 Atrophy of thyroid (acquired): Secondary | ICD-10-CM | POA: Diagnosis not present

## 2021-08-06 DIAGNOSIS — Z006 Encounter for examination for normal comparison and control in clinical research program: Secondary | ICD-10-CM | POA: Diagnosis not present

## 2021-08-06 DIAGNOSIS — L4050 Arthropathic psoriasis, unspecified: Secondary | ICD-10-CM | POA: Diagnosis present

## 2021-08-06 DIAGNOSIS — G894 Chronic pain syndrome: Secondary | ICD-10-CM | POA: Diagnosis present

## 2021-08-06 DIAGNOSIS — I350 Nonrheumatic aortic (valve) stenosis: Principal | ICD-10-CM | POA: Diagnosis present

## 2021-08-06 DIAGNOSIS — L4052 Psoriatic arthritis mutilans: Secondary | ICD-10-CM | POA: Diagnosis present

## 2021-08-06 DIAGNOSIS — Z23 Encounter for immunization: Secondary | ICD-10-CM | POA: Diagnosis not present

## 2021-08-06 DIAGNOSIS — Z885 Allergy status to narcotic agent status: Secondary | ICD-10-CM | POA: Diagnosis not present

## 2021-08-06 DIAGNOSIS — G4733 Obstructive sleep apnea (adult) (pediatric): Secondary | ICD-10-CM | POA: Diagnosis present

## 2021-08-06 DIAGNOSIS — Z87891 Personal history of nicotine dependence: Secondary | ICD-10-CM

## 2021-08-06 DIAGNOSIS — Z888 Allergy status to other drugs, medicaments and biological substances status: Secondary | ICD-10-CM

## 2021-08-06 DIAGNOSIS — N1831 Chronic kidney disease, stage 3a: Secondary | ICD-10-CM | POA: Diagnosis present

## 2021-08-06 DIAGNOSIS — E7800 Pure hypercholesterolemia, unspecified: Secondary | ICD-10-CM | POA: Diagnosis present

## 2021-08-06 DIAGNOSIS — D45 Polycythemia vera: Secondary | ICD-10-CM | POA: Diagnosis not present

## 2021-08-06 DIAGNOSIS — I743 Embolism and thrombosis of arteries of the lower extremities: Secondary | ICD-10-CM | POA: Diagnosis not present

## 2021-08-06 HISTORY — PX: LOWER EXTREMITY ANGIOGRAM: SHX5508

## 2021-08-06 HISTORY — PX: PATCH ANGIOPLASTY: SHX6230

## 2021-08-06 HISTORY — DX: Presence of prosthetic heart valve: Z95.2

## 2021-08-06 HISTORY — PX: GROIN DISSECTION: SHX5250

## 2021-08-06 HISTORY — PX: INTRAOPERATIVE TRANSTHORACIC ECHOCARDIOGRAM: SHX6523

## 2021-08-06 HISTORY — PX: TRANSCATHETER AORTIC VALVE REPLACEMENT, TRANSFEMORAL: SHX6400

## 2021-08-06 LAB — POCT I-STAT, CHEM 8
BUN: 12 mg/dL (ref 8–23)
Calcium, Ion: 1.29 mmol/L (ref 1.15–1.40)
Chloride: 108 mmol/L (ref 98–111)
Creatinine, Ser: 0.8 mg/dL (ref 0.44–1.00)
Glucose, Bld: 119 mg/dL — ABNORMAL HIGH (ref 70–99)
HCT: 40 % (ref 36.0–46.0)
Hemoglobin: 13.6 g/dL (ref 12.0–15.0)
Potassium: 4.1 mmol/L (ref 3.5–5.1)
Sodium: 142 mmol/L (ref 135–145)
TCO2: 25 mmol/L (ref 22–32)

## 2021-08-06 LAB — ABO/RH: ABO/RH(D): A POS

## 2021-08-06 SURGERY — IMPLANTATION, AORTIC VALVE, TRANSCATHETER, FEMORAL APPROACH
Anesthesia: General | Site: Chest

## 2021-08-06 MED ORDER — HYDROCODONE-ACETAMINOPHEN 5-325 MG PO TABS
1.0000 | ORAL_TABLET | Freq: Every day | ORAL | Status: DC
  Administered 2021-08-06: 1 via ORAL
  Filled 2021-08-06: qty 1

## 2021-08-06 MED ORDER — INFLUENZA VAC A&B SA ADJ QUAD 0.5 ML IM PRSY
0.5000 mL | PREFILLED_SYRINGE | INTRAMUSCULAR | Status: AC
  Administered 2021-08-07: 0.5 mL via INTRAMUSCULAR
  Filled 2021-08-06: qty 0.5

## 2021-08-06 MED ORDER — GLYCOPYRROLATE PF 0.2 MG/ML IJ SOSY
PREFILLED_SYRINGE | INTRAMUSCULAR | Status: DC | PRN
  Administered 2021-08-06: .1 mg via INTRAVENOUS

## 2021-08-06 MED ORDER — CHLORHEXIDINE GLUCONATE 0.12 % MT SOLN
15.0000 mL | Freq: Once | OROMUCOSAL | Status: AC
  Administered 2021-08-06: 15 mL via OROMUCOSAL
  Filled 2021-08-06: qty 15

## 2021-08-06 MED ORDER — SODIUM CHLORIDE 0.9 % IV SOLN: 250.0000 mL | INTRAVENOUS | Status: DC

## 2021-08-06 MED ORDER — NITROGLYCERIN IN D5W 200-5 MCG/ML-% IV SOLN: 0.0000 ug/min | INTRAVENOUS | Status: DC

## 2021-08-06 MED ORDER — SODIUM CHLORIDE 0.9 % IV SOLN: INTRAVENOUS | Status: DC

## 2021-08-06 MED ORDER — ACETAMINOPHEN 10 MG/ML IV SOLN: 1000.0000 mg | Freq: Once | INTRAVENOUS | Status: DC | PRN

## 2021-08-06 MED ORDER — CEFAZOLIN SODIUM-DEXTROSE 2-4 GM/100ML-% IV SOLN
2.0000 g | Freq: Three times a day (TID) | INTRAVENOUS | Status: AC
  Administered 2021-08-06 – 2021-08-07 (×2): 2 g via INTRAVENOUS
  Filled 2021-08-06 (×2): qty 100

## 2021-08-06 MED ORDER — HEPARIN 6000 UNIT IRRIGATION SOLUTION
Status: DC | PRN
  Administered 2021-08-06 (×2): 1

## 2021-08-06 MED ORDER — EZETIMIBE 10 MG PO TABS
10.0000 mg | ORAL_TABLET | Freq: Every day | ORAL | Status: DC
  Administered 2021-08-06 – 2021-08-07 (×2): 10 mg via ORAL
  Filled 2021-08-06 (×2): qty 1

## 2021-08-06 MED ORDER — PROTAMINE SULFATE 10 MG/ML IV SOLN
INTRAVENOUS | Status: DC | PRN
Start: 2021-08-06 — End: 2021-08-06
  Administered 2021-08-06 (×3): 10 mg via INTRAVENOUS
  Administered 2021-08-06 (×4): 30 mg via INTRAVENOUS
  Administered 2021-08-06 (×2): 10 mg via INTRAVENOUS

## 2021-08-06 MED ORDER — LIDOCAINE HCL 1 % IJ SOLN
INTRAMUSCULAR | Status: AC
  Filled 2021-08-06: qty 20

## 2021-08-06 MED ORDER — LEVOTHYROXINE SODIUM 88 MCG PO TABS
88.0000 ug | ORAL_TABLET | Freq: Every day | ORAL | Status: DC
  Administered 2021-08-07: 88 ug via ORAL
  Filled 2021-08-06: qty 1

## 2021-08-06 MED ORDER — PHENYLEPHRINE HCL-NACL 20-0.9 MG/250ML-% IV SOLN
0.0000 ug/min | INTRAVENOUS | Status: DC
Start: 2021-08-06 — End: 2021-08-06

## 2021-08-06 MED ORDER — HEPARIN SODIUM (PORCINE) 1000 UNIT/ML IJ SOLN
INTRAMUSCULAR | Status: DC | PRN
  Administered 2021-08-06: 12000 [IU] via INTRAVENOUS
  Administered 2021-08-06: 6000 [IU] via INTRAVENOUS

## 2021-08-06 MED ORDER — PROPOFOL 500 MG/50ML IV EMUL
INTRAVENOUS | Status: AC
  Filled 2021-08-06: qty 50

## 2021-08-06 MED ORDER — SODIUM CHLORIDE 0.9% FLUSH
3.0000 mL | Freq: Two times a day (BID) | INTRAVENOUS | Status: DC
  Administered 2021-08-07: 3 mL via INTRAVENOUS

## 2021-08-06 MED ORDER — HEMOSTATIC AGENTS (NO CHARGE) OPTIME
TOPICAL | Status: DC | PRN
  Administered 2021-08-06: 1 via TOPICAL

## 2021-08-06 MED ORDER — PROTAMINE SULFATE 10 MG/ML IV SOLN
INTRAVENOUS | Status: AC
  Filled 2021-08-06: qty 5

## 2021-08-06 MED ORDER — ASPIRIN 81 MG PO CHEW
81.0000 mg | CHEWABLE_TABLET | Freq: Every day | ORAL | Status: DC
  Administered 2021-08-07: 81 mg via ORAL
  Filled 2021-08-06: qty 1

## 2021-08-06 MED ORDER — ACETAMINOPHEN 500 MG PO TABS: 1000.0000 mg | ORAL_TABLET | Freq: Once | ORAL | Status: DC | PRN

## 2021-08-06 MED ORDER — DEXAMETHASONE SODIUM PHOSPHATE 10 MG/ML IJ SOLN
INTRAMUSCULAR | Status: DC | PRN
  Administered 2021-08-06: 5 mg via INTRAVENOUS

## 2021-08-06 MED ORDER — CHLORHEXIDINE GLUCONATE 4 % EX LIQD: 30.0000 mL | CUTANEOUS | Status: DC

## 2021-08-06 MED ORDER — PROPOFOL 10 MG/ML IV BOLUS
INTRAVENOUS | Status: AC
  Filled 2021-08-06: qty 20

## 2021-08-06 MED ORDER — CHLORHEXIDINE GLUCONATE 4 % EX LIQD: 60.0000 mL | Freq: Once | CUTANEOUS | Status: DC

## 2021-08-06 MED ORDER — IODIXANOL 320 MG/ML IV SOLN
INTRAVENOUS | Status: DC | PRN
  Administered 2021-08-06: 70.8 mL via INTRA_ARTERIAL

## 2021-08-06 MED ORDER — SODIUM CHLORIDE 0.9 % IV SOLN: INTRAVENOUS | Status: AC

## 2021-08-06 MED ORDER — SODIUM CHLORIDE 0.9% FLUSH: 3.0000 mL | INTRAVENOUS | Status: DC | PRN

## 2021-08-06 MED ORDER — LACTATED RINGERS IV SOLN: INTRAVENOUS | Status: DC | PRN

## 2021-08-06 MED ORDER — FENTANYL CITRATE (PF) 250 MCG/5ML IJ SOLN
INTRAMUSCULAR | Status: AC
  Filled 2021-08-06: qty 5

## 2021-08-06 MED ORDER — DEXAMETHASONE SODIUM PHOSPHATE 10 MG/ML IJ SOLN
INTRAMUSCULAR | Status: AC
  Filled 2021-08-06: qty 1

## 2021-08-06 MED ORDER — ONDANSETRON HCL 4 MG/2ML IJ SOLN
INTRAMUSCULAR | Status: AC
  Filled 2021-08-06: qty 2

## 2021-08-06 MED ORDER — ONDANSETRON HCL 4 MG/2ML IJ SOLN
INTRAMUSCULAR | Status: DC | PRN
  Administered 2021-08-06: 4 mg via INTRAVENOUS

## 2021-08-06 MED ORDER — ACETAMINOPHEN 325 MG PO TABS: 650.0000 mg | ORAL_TABLET | Freq: Four times a day (QID) | ORAL | Status: DC | PRN

## 2021-08-06 MED ORDER — HEPARIN 6000 UNIT IRRIGATION SOLUTION
Status: AC
  Filled 2021-08-06: qty 1500

## 2021-08-06 MED ORDER — HEPARIN 6000 UNIT IRRIGATION SOLUTION
Status: AC
  Filled 2021-08-06: qty 500

## 2021-08-06 MED ORDER — SODIUM CHLORIDE 0.9 % IV SOLN: 250.0000 mL | INTRAVENOUS | Status: DC | PRN

## 2021-08-06 MED ORDER — ACETAMINOPHEN 650 MG RE SUPP
650.0000 mg | Freq: Four times a day (QID) | RECTAL | Status: DC | PRN
Start: 2021-08-06 — End: 2021-08-07

## 2021-08-06 MED ORDER — CLOPIDOGREL BISULFATE 75 MG PO TABS
75.0000 mg | ORAL_TABLET | Freq: Every day | ORAL | Status: DC
  Administered 2021-08-07: 75 mg via ORAL
  Filled 2021-08-06: qty 1

## 2021-08-06 MED ORDER — ACETAMINOPHEN 160 MG/5ML PO SOLN: 1000.0000 mg | Freq: Once | ORAL | Status: DC | PRN

## 2021-08-06 MED ORDER — PROPOFOL 500 MG/50ML IV EMUL
INTRAVENOUS | Status: DC | PRN
  Administered 2021-08-06: 50 ug/kg/min via INTRAVENOUS
  Administered 2021-08-06: 40 mg via INTRAVENOUS

## 2021-08-06 MED ORDER — FENTANYL CITRATE (PF) 100 MCG/2ML IJ SOLN
INTRAMUSCULAR | Status: DC | PRN
  Administered 2021-08-06 (×4): 25 ug via INTRAVENOUS

## 2021-08-06 MED ORDER — FENTANYL CITRATE (PF) 100 MCG/2ML IJ SOLN: 25.0000 ug | INTRAMUSCULAR | Status: DC | PRN

## 2021-08-06 MED ORDER — LIDOCAINE HCL 1 % IJ SOLN
INTRAMUSCULAR | Status: DC | PRN
  Administered 2021-08-06: 10 mL

## 2021-08-06 MED ORDER — LACTATED RINGERS IV SOLN: INTRAVENOUS | Status: DC

## 2021-08-06 SURGICAL SUPPLY — 102 items
ADH SKN CLS APL DERMABOND .7 (GAUZE/BANDAGES/DRESSINGS) ×3
APL PRP STRL LF DISP 70% ISPRP (MISCELLANEOUS) ×3
BAG COUNTER SPONGE SURGICOUNT (BAG) ×4 IMPLANT
BAG DECANTER FOR FLEXI CONT (MISCELLANEOUS) IMPLANT
BAG SNAP BAND KOVER 36X36 (MISCELLANEOUS) ×4 IMPLANT
BAG SPNG CNTER NS LX DISP (BAG) ×3
BLADE CLIPPER SURG (BLADE) IMPLANT
BLADE STERNUM SYSTEM 6 (BLADE) IMPLANT
BLADE SURG 10 STRL SS (BLADE) IMPLANT
CABLE ADAPT CONN TEMP 6FT (ADAPTER) ×4 IMPLANT
CANISTER SUCT 3000ML PPV (MISCELLANEOUS) IMPLANT
CATH CROSS OVER TEMPO 5F (CATHETERS) ×4 IMPLANT
CATH DIAG EXPO 6F AL1 (CATHETERS) ×4 IMPLANT
CATH DIAG EXPO 6F VENT PIG 145 (CATHETERS) ×8 IMPLANT
CATH EXTERNAL FEMALE PUREWICK (CATHETERS) IMPLANT
CATH INFINITI 6F AL2 (CATHETERS) IMPLANT
CATH S G BIP PACING (CATHETERS) ×4 IMPLANT
CATH SOFT-VU 4F 65 STRAIGHT (CATHETERS) ×3 IMPLANT
CATH SOFT-VU STRAIGHT 4F 65CM (CATHETERS) ×4
CHLORAPREP W/TINT 26 (MISCELLANEOUS) ×4 IMPLANT
CLIP VESOCCLUDE MED 24/CT (CLIP) IMPLANT
CLIP VESOCCLUDE SM WIDE 24/CT (CLIP) IMPLANT
CLOSURE MYNX CONTROL 5F (Vascular Products) ×4 IMPLANT
CLOSURE MYNX CONTROL 6F/7F (Vascular Products) ×4 IMPLANT
CNTNR URN SCR LID CUP LEK RST (MISCELLANEOUS) ×6 IMPLANT
CONT SPEC 4OZ STRL OR WHT (MISCELLANEOUS) ×8
COVER BACK TABLE 80X110 HD (DRAPES) IMPLANT
COVER PROBE W GEL 5X96 (DRAPES) ×4 IMPLANT
DECANTER SPIKE VIAL GLASS SM (MISCELLANEOUS) ×4 IMPLANT
DERMABOND ADVANCED (GAUZE/BANDAGES/DRESSINGS) ×1
DERMABOND ADVANCED .7 DNX12 (GAUZE/BANDAGES/DRESSINGS) ×3 IMPLANT
DEVICE CLOSURE PERCLS PRGLD 6F (VASCULAR PRODUCTS) ×6 IMPLANT
DRAPE INCISE IOBAN 66X45 STRL (DRAPES) IMPLANT
DRSG TEGADERM 4X4.75 (GAUZE/BANDAGES/DRESSINGS) ×8 IMPLANT
ELECT CAUTERY BLADE 6.4 (BLADE) IMPLANT
ELECT REM PT RETURN 9FT ADLT (ELECTROSURGICAL) ×8
ELECTRODE REM PT RTRN 9FT ADLT (ELECTROSURGICAL) ×6 IMPLANT
FELT TEFLON 6X6 (MISCELLANEOUS) ×4 IMPLANT
GAUZE SPONGE 4X4 12PLY STRL (GAUZE/BANDAGES/DRESSINGS) ×4 IMPLANT
GLOVE EUDERMIC 7 POWDERFREE (GLOVE) IMPLANT
GLOVE SURG ENC MOIS LTX SZ7.5 (GLOVE) IMPLANT
GLOVE SURG ENC MOIS LTX SZ8 (GLOVE) IMPLANT
GLOVE SURG ORTHO LTX SZ7.5 (GLOVE) IMPLANT
GOWN STRL REUS W/ TWL LRG LVL3 (GOWN DISPOSABLE) IMPLANT
GOWN STRL REUS W/ TWL XL LVL3 (GOWN DISPOSABLE) ×3 IMPLANT
GOWN STRL REUS W/TWL LRG LVL3 (GOWN DISPOSABLE)
GOWN STRL REUS W/TWL XL LVL3 (GOWN DISPOSABLE) ×4
GUIDEWIRE SAF TJ AMPL .035X180 (WIRE) ×4 IMPLANT
GUIDEWIRE SAFE TJ AMPLATZ EXST (WIRE) ×4 IMPLANT
INSERT FOGARTY SM (MISCELLANEOUS) IMPLANT
KIT BASIN OR (CUSTOM PROCEDURE TRAY) ×4 IMPLANT
KIT HEART LEFT (KITS) ×4 IMPLANT
KIT MICROPUNCTURE NIT STIFF (SHEATH) ×12 IMPLANT
KIT SUCTION CATH 14FR (SUCTIONS) IMPLANT
KIT TURNOVER KIT B (KITS) ×4 IMPLANT
LOOP VESSEL MAXI BLUE (MISCELLANEOUS) IMPLANT
LOOP VESSEL MINI RED (MISCELLANEOUS) IMPLANT
NS IRRIG 1000ML POUR BTL (IV SOLUTION) ×4 IMPLANT
PACK ENDO MINOR (CUSTOM PROCEDURE TRAY) ×4 IMPLANT
PACK PERIPHERAL VASCULAR (CUSTOM PROCEDURE TRAY) ×4 IMPLANT
PAD ARMBOARD 7.5X6 YLW CONV (MISCELLANEOUS) ×8 IMPLANT
PAD ELECT DEFIB RADIOL ZOLL (MISCELLANEOUS) ×4 IMPLANT
PENCIL BUTTON HOLSTER BLD 10FT (ELECTRODE) ×4 IMPLANT
PERCLOSE PROGLIDE 6F (VASCULAR PRODUCTS) ×8
POSITIONER HEAD DONUT 9IN (MISCELLANEOUS) ×4 IMPLANT
POWDER SURGICEL 3.0 GRAM (HEMOSTASIS) ×4 IMPLANT
SET MICROPUNCTURE 5F STIFF (MISCELLANEOUS) ×4 IMPLANT
SHEATH BRITE TIP 7FR 35CM (SHEATH) ×4 IMPLANT
SHEATH PINNACLE 6F 10CM (SHEATH) ×4 IMPLANT
SHEATH PINNACLE 8F 10CM (SHEATH) ×4 IMPLANT
SLEEVE REPOSITIONING LENGTH 30 (MISCELLANEOUS) ×4 IMPLANT
STOPCOCK MORSE 400PSI 3WAY (MISCELLANEOUS) ×8 IMPLANT
SUT ETHIBOND X763 2 0 SH 1 (SUTURE) IMPLANT
SUT GORETEX CV 4 TH 22 36 (SUTURE) IMPLANT
SUT GORETEX CV4 TH-18 (SUTURE) IMPLANT
SUT MNCRL AB 3-0 PS2 18 (SUTURE) IMPLANT
SUT MNCRL AB 4-0 PS2 18 (SUTURE) ×4 IMPLANT
SUT PROLENE 5 0 C 1 24 (SUTURE) ×12 IMPLANT
SUT PROLENE 5 0 C 1 36 (SUTURE) IMPLANT
SUT PROLENE 6 0 BV (SUTURE) ×4 IMPLANT
SUT PROLENE 6 0 C 1 30 (SUTURE) IMPLANT
SUT SILK  1 MH (SUTURE) ×4
SUT SILK 1 MH (SUTURE) ×3 IMPLANT
SUT VIC AB 2-0 CT1 27 (SUTURE) ×4
SUT VIC AB 2-0 CT1 TAPERPNT 27 (SUTURE) ×3 IMPLANT
SUT VIC AB 2-0 CTX 36 (SUTURE) IMPLANT
SUT VIC AB 3-0 SH 27 (SUTURE) ×4
SUT VIC AB 3-0 SH 27X BRD (SUTURE) ×3 IMPLANT
SUT VIC AB 3-0 SH 8-18 (SUTURE) IMPLANT
SYR 50ML LL SCALE MARK (SYRINGE) ×4 IMPLANT
SYR BULB IRRIG 60ML STRL (SYRINGE) IMPLANT
SYR MEDRAD MARK V 150ML (SYRINGE) ×4 IMPLANT
TOWEL GREEN STERILE (TOWEL DISPOSABLE) ×8 IMPLANT
TRANSDUCER W/STOPCOCK (MISCELLANEOUS) ×8 IMPLANT
TRAY FOLEY SLVR 14FR TEMP STAT (SET/KITS/TRAYS/PACK) IMPLANT
TUBE SUCT INTRACARD DLP 20F (MISCELLANEOUS) IMPLANT
TUBING CIL FLEX 10 FLL-RA (TUBING) ×4 IMPLANT
VALVE 23 ULTRA SAPIEN KIT (Valve) ×4 IMPLANT
WATER STERILE IRR 1000ML POUR (IV SOLUTION) ×8 IMPLANT
WIRE EMERALD 3MM-J .035X150CM (WIRE) ×4 IMPLANT
WIRE EMERALD 3MM-J .035X260CM (WIRE) ×4 IMPLANT
WIRE HI TORQ VERSACORE-J 145CM (WIRE) ×4 IMPLANT

## 2021-08-06 NOTE — Progress Notes (Signed)
  [place] VALVE TEAM  Patient doing well s/p TAVR. She is hemodynamically stable. Groin sites stable, cut down looks good. ECG not completed yet.  Plan to DC arterial line and transfer to 4E.  Plan for early ambulation after bedrest completed and hopeful discharge over the next 24-48 hours.   Angelena Form PA-C  [facility]  Pager [phone]

## 2021-08-06 NOTE — Anesthesia Procedure Notes (Signed)
Procedure Name: LMA Insertion Date/Time: 08/06/2021 2:00 PM Performed by: Barrington Ellison, CRNA Pre-anesthesia Checklist: Patient identified, Emergency Drugs available, Suction available and Patient being monitored Patient Re-evaluated:Patient Re-evaluated prior to induction Oxygen Delivery Method: Circle System Utilized Preoxygenation: Pre-oxygenation with 100% oxygen Induction Type: IV induction Ventilation: Mask ventilation without difficulty LMA: LMA inserted LMA Size: 3.0 Number of attempts: 1 Placement Confirmation: positive ETCO2 Tube secured with: Tape Dental Injury: Teeth and Oropharynx as per pre-operative assessment

## 2021-08-06 NOTE — Interval H&P Note (Signed)
History and Physical Interval Note:  08/06/2021 10:16 AM  Sydney Reid  has presented today for surgery, with the diagnosis of AO.  The various methods of treatment have been discussed with the patient and family. After consideration of risks, benefits and other options for treatment, the patient has consented to  Procedure(s): TRANSCATHETER AORTIC VALVE REPLACEMENT, TRANSFEMORAL with possible Shockwave lithotripsy (N/A) TRANSESOPHAGEAL ECHOCARDIOGRAM (TEE) (N/A) LOWER EXTREMITY ANGIOGRAM (N/A) AORTOGRAM (N/A) as a surgical intervention.  The patient's history has been reviewed, patient examined, no change in status, stable for surgery.  I have reviewed the patient's chart and labs.  Questions were answered to the patient's satisfaction.     Sherren Mocha

## 2021-08-06 NOTE — Anesthesia Procedure Notes (Signed)
Procedure Name: MAC Date/Time: 08/06/2021 11:40 AM Performed by: Barrington Ellison, CRNA Pre-anesthesia Checklist: Patient identified, Emergency Drugs available, Suction available, Patient being monitored and Timeout performed Patient Re-evaluated:Patient Re-evaluated prior to induction Oxygen Delivery Method: Simple face mask

## 2021-08-06 NOTE — Op Note (Signed)
Patient name: Sydney Reid MRN: [id number] DOB: 05/13/1941 Sex: female  08/06/2021 Pre-operative Diagnosis: Acute left lower extremity ischemia Post-operative diagnosis:  Same Surgeon:  Erlene Quan C. Donzetta Matters, MD Assistant: Paul Half, PA Procedure Performed: 1.  Left common femoral endarterectomy with vein patch angioplasty 2.  Mynx device closure right common femoral artery sheath   Indications: 80 year old female underwent TAVR today via left common femoral approach.  At completion she was having decreased sensation in her left foot with absent distal signals and angiography was performed which demonstrated occlusion of the left common femoral artery she was subsequently indicated for the above-noted procedure.  We are proceeding emergently given patient was Intra-Op at the time of the above findings and no consent was obtained though her family was updated prior to proceeding.  An assistant was necessary to facilitate exposure and expedite the case  Findings: Left common femoral artery was diseased there was a soft area for clamping in the distal external iliac artery.  The SFA and profunda were free of disease.  Upon opening the artery we encountered acute thrombus with heaped up intima tacked with the previously placed closure device.  We performed endarterectomy removing all the intact intima.  We had very strong inflow.  Distally we had good tapering at the level of the SFA.  There was strong backbleeding from the SFA as well as pulsatile backbleeding from the profunda.  There was a large greater saphenous vein that was harvested and used for patch.  At completion she had strong dorsalis pedis signals bilaterally after placement of closure device on the right side.   Procedure:  The patient was first identified on the operating table.  There were no identifiable signals in her left lower extremity.  She did have venous and arterial sheath in the right groin.  The arterial sheath was 5  [place] the venous sheath was 7 [place].  Angiographic images were reviewed we elected to proceed emergently with the above procedure.  General anesthesia was induced.  She was sterilely reprepped and draped in the bilateral groins in usual fashion.  Ultrasound was used to identify the left common femoral artery which was noted to be thrombosed.  Longitudinal incision was made we dissected down to the level of the inguinal ligament.  We dissected under the inguinal ligament and placed a vessel loop around the external iliac artery.  There was a pulse.  It was a vessel was soft and amenable for clamping.  We dissected down to the profunda and SFA and placed Vesseloops around this.  Through the same incision we identified the greater saphenous vein.  We dissected out this vein for approximately 8 cm.  Branches were divided between clips and ties.  Distally we clamped it and transected and tied off with 2-0 silk suture.  At the saphenofemoral junction I placed a side-biting clamp transected and oversewed the junction with 5-0 Prolene suture in a running mattress fashion.  Patient was then fully heparinized.  We clamped the SFA and profunda followed by the external leg artery.  We open the common femoral artery longitudinally just down to the SFA profunda bifurcation.  There we encountered acute thrombus and heaped up intima.  We performed endarterectomy.  We extended this above the inguinal ligament we had very strong inflow.  We performed endarterectomy down to the profunda where we then establish very strong backbleeding.  We trimmed the endarterectomy specimen at the level of the SFA and had good backbleeding there.  We  brought our vein to the table.  We opened this longitudinally.  We then reversed it and spatulated the end and sewed in place as a patch with 5-0 Prolene suture.  Prior completion without flushing all directions thoroughly irrigated the common femoral artery with heparinized saline.  We then released  our profunda followed by external leg artery clamps and in the SFA.  There was good signal in the SFA and profunda.  The left foot signal was checked she was noted to have good DP there as well as on the right foot where the previous sheath was placed.  50 mg of protamine was administered.  We then deployed a minx device in the right common femoral sheath and the venous sheath was pulled from the right common femoral vein and pressure was held till hemostasis obtained.  We thoroughly irrigated the wound obtaining the stasis on the left and closed in layers with Vicryl and Monocryl.  Dermabond was placed at the skin level.  She was again noted to have good signals bilaterally dorsalis pedis.  She was then awakened from anesthesia having tolerated procedure without any complication.  All counts were correct at completion.  EBL: 100cc  Naisha Wisdom C. Donzetta Matters, MD [facility] Office: [phone] Pager: [phone]

## 2021-08-06 NOTE — Progress Notes (Signed)
Pt arrived from PACU, CHG complete, VSS, orders released, groins level 0, oriented to unit, call light within reach.   Chrisandra Carota, RN 08/06/2021 5:41 PM

## 2021-08-06 NOTE — Anesthesia Procedure Notes (Signed)
Arterial Line Insertion Start/End10/08/2021 10:01 AM, 08/06/2021 10:01 AM Performed by: Renato Shin, CRNA, CRNA  Patient location: Pre-op. Preanesthetic checklist: patient identified, IV checked, site marked, risks and benefits discussed, surgical consent, monitors and equipment checked, pre-op evaluation, timeout performed and anesthesia consent Lidocaine 1% used for infiltration Right, radial was placed Catheter size: 20 G Hand hygiene performed , maximum sterile barriers used  and Seldinger technique used Allen's test indicative of satisfactory collateral circulation Attempts: 1 Procedure performed without using ultrasound guided technique. Following insertion, dressing applied and Biopatch. Post procedure assessment: normal  Patient tolerated the procedure well with no immediate complications.

## 2021-08-06 NOTE — Transfer of Care (Signed)
Immediate Anesthesia Transfer of Care Note  Patient: Sydney Reid  Procedure(s) Performed: TRANSCATHETER AORTIC VALVE REPLACEMENT, TRANSFEMORAL (Chest) INTRAOPERATIVE TRANSTHORACIC ECHOCARDIOGRAM LEFT LOWER EXTREMITY ANGIOGRAM GROIN EXPLORATION  PATCH ANGIOPLASTY OF FEMORAL ARTERY USING LEFT GREATER SAPHENOUS VEIN  Patient Location: PACU  Anesthesia Type:General  Level of Consciousness: awake, alert  and oriented  Airway & Oxygen Therapy: Patient Spontanous Breathing  Post-op Assessment: Report given to RN  Post vital signs: Reviewed and stable  Last Vitals:  Vitals Value Taken Time  BP 101/45 08/06/21 1607  Temp    Pulse 80 08/06/21 1608  Resp 16 08/06/21 1608  SpO2 95 % 08/06/21 1608  Vitals shown include unvalidated device data.  Last Pain:  Vitals:   08/06/21 0921  TempSrc:   PainSc: 0-No pain      Patients Stated Pain Goal: 0 (41/14/64 3142)  Complications: No notable events documented.

## 2021-08-06 NOTE — Consult Note (Signed)
Intraop consult   History of Present Illness This is a 80 y.o. female undergoing TAVR procedure.  At completion she was complaining of left foot pain.  Angiography was performed appears to show occlusion at the level to common femoral artery.  Patient currently sedated on the operating room table.  Past Medical History:  Diagnosis Date   Complication of anesthesia    Difficult intubation    20-25 years ago   H/O total knee replacement 10/28/1999   Right   High cholesterol    History of tonsillectomy and adenoidectomy 10/27/1944   History of transcatheter aortic valve replacement (TAVR) 08/06/2021   s/p Edwards 35mm S3 vis TF approach with Dr. Burt Knack and Dr. Cyndia Bent   Psoriatic arthritis [facility]) 10/27/1988   Sleep apnea with use of continuous positive airway pressure (CPAP) 10/27/2002   Thyroid disease     Past Surgical History:  Procedure Laterality Date   CATARACT EXTRACTION W/ INTRAOCULAR LENS IMPLANT Bilateral    FOOT SURGERY     fused arch in left foot   RIGHT/LEFT HEART CATH AND CORONARY ANGIOGRAPHY N/A 04/16/2021   Procedure: RIGHT/LEFT HEART CATH AND CORONARY ANGIOGRAPHY;  Surgeon: Adrian Prows, MD;  Location: [facility];  Service: Cardiovascular;  Laterality: N/A;   SPINE SURGERY     TONSILLECTOMY     TOTAL KNEE ARTHROPLASTY Left     Allergies  Allergen Reactions   Codeine Nausea And Vomiting    Weakness and dysequilibrium   Statins Hives    Myalgias     Prior to Admission medications   Medication Sig Start Date End Date Taking? Authorizing Provider  ammonium lactate (LAC-HYDRIN) 12 % lotion Apply 1 application topically as needed for dry skin. 01/08/21  Yes [provider]  aspirin EC 81 MG tablet Take 81 mg by mouth daily. Swallow whole.   Yes [provider]  Cholecalciferol (VITAMIN D3) 250 MCG (10000 UT) capsule Take 4,000 Units by mouth daily. Except none on Saturday and Sunday   Yes [provider]  etanercept (ENBREL) 50  MG/ML injection Inject 50 mg into the skin every Sunday.    Yes [provider]  ezetimibe (ZETIA) 10 MG tablet Take 1 tablet (10 mg total) by mouth daily. 06/12/21  Yes [name], NP  HYDROcodone-acetaminophen (NORCO) 5-325 MG tablet Take 1 tablet by mouth at bedtime. 07/11/21  Yes [name], NP  levothyroxine (SYNTHROID) 88 MCG tablet Take one tablet by mouth once daily 30 minutes before breakfast on an empty stomach for thyroid. 02/26/21  Yes [name], NP  simvastatin (ZOCOR) 10 MG tablet TAKE 1 TABLET BY MOUTH EVERYDAY AT BEDTIME 06/26/21  Yes Adrian Prows, MD    Social History   Socioeconomic History   Marital status: Married    Spouse name: Not on file   Number of children: Not on file   Years of education: Not on file   Highest education level: Not on file  Occupational History   Not on file  Tobacco Use   Smoking status: Former    Packs/day: 2.00    Years: 18.00    Pack years: 36.00    Types: Cigarettes    Quit date: 48    Years since quitting: 47.8   Smokeless tobacco: Never  Vaping Use   Vaping Use: Never used  Substance and Sexual Activity   Alcohol use: Yes    Alcohol/week: 1.0 - 2.0 standard drink    Types: 1 - 2 Standard drinks  or equivalent per week    Comment: Social drinker, couple times a week   Drug use: No   Sexual activity: Not on file  Other Topics Concern   Not on file  Social History Narrative   Diet? Normal      Do you drink/eat things with caffeine? Yes, 1 mt. Dew per day      Marital status?              m                      What year were you married? 1988      Do you live in a house, apartment, assisted living, condo, trailer, etc.? Townhouse      Is it one or more stories? 2 story      How many persons live in your home? 2      Do you have any pets in your home? (please list) no      Current or past profession: Is manager      Do you exercise?     No (did it for 50 years)                       Type &  how often?      Do you have a living will? yes      Do you have a DNR form?        yes                          If not, do you want to discuss one?      Do you have signed POA/HPOA for forms?       Social Determinants of Health   Financial Resource Strain: Not on file  Food Insecurity: Not on file  Transportation Needs: Not on file  Physical Activity: Not on file  Stress: Not on file  Social Connections: Not on file  Intimate Partner Violence: Not on file     Family History  Problem Relation Age of Onset   Heart attack Father    Heart disease Sister    Review of systems could not be obtained secondary to patient sedated on operating table    Physical Examination  Vitals:   08/06/21 0851  BP: (!) 153/71  Pulse: (!) 103  Resp: 20  Temp: 97.9 F (36.6 C)  SpO2: 99%   Body mass index is 23.96 kg/m.  Patient operating room table without signals left foot  CBC    Component Value Date/Time   WBC 7.3 08/02/2021 1441   RBC 5.62 (H) 08/02/2021 1441   HGB 13.6 08/06/2021 1159   HGB 14.3 05/16/2021 1124   HGB 14.9 04/12/2021 1002   HCT 40.0 08/06/2021 1159   HCT 47.9 (H) 04/12/2021 1002   PLT 254 08/02/2021 1441   PLT 229 05/16/2021 1124   PLT 278 04/12/2021 1002   MCV 82.9 08/02/2021 1441   MCV 83 04/12/2021 1002   MCH 26.0 08/02/2021 1441   MCHC 31.3 08/02/2021 1441   RDW 15.3 08/02/2021 1441   RDW 13.1 04/12/2021 1002   LYMPHSABS 1.6 05/16/2021 1124   MONOABS 0.7 05/16/2021 1124   EOSABS 0.0 05/16/2021 1124   BASOSABS 0.0 05/16/2021 1124    BMET    Component Value Date/Time   NA 142 08/06/2021 1159   NA 142 06/11/2021 1035   K 4.1 08/06/2021 1159  CL 108 08/06/2021 1159   CO2 21 (L) 08/02/2021 1441   GLUCOSE 119 (H) 08/06/2021 1159   BUN 12 08/06/2021 1159   BUN 15 06/11/2021 1035   CREATININE 0.80 08/06/2021 1159   CREATININE 1.05 (H) 03/14/2021 1141   CREATININE 1.21 (H) 03/06/2021 0856   CALCIUM 9.3 08/02/2021 1441   GFRNONAA >60  08/02/2021 1441   GFRNONAA 53 (L) 03/14/2021 1141   GFRNONAA 42 (L) 03/06/2021 0856   GFRAA 49 (L) 03/06/2021 0856    COAGS: Lab Results  Component Value Date   INR 0.9 08/02/2021     Non-Invasive Vascular Imaging:   I reviewed imaging with Dr. Burt Knack of preoperative CT as well as current angiography.  She appears to have diseased SFA chronically with large profunda femoris.  ASSESSMENT/PLAN: This is a 80 y.o. female status post TAVR now with occlusion and what appears to be the left common femoral level.  CT was reviewed and demonstrates preoperative disease.  We will plan for cutdown left common femoral endarterectomy possible thrombectomy and possible patch angioplasty.  Corry Storie C. Donzetta Matters, MD [facility] Office: [phone] Pager: [phone]

## 2021-08-06 NOTE — Op Note (Signed)
[facility]   MULTIDISCIPLINARY HEART VALVE TEAM   TAVR OPERATIVE NOTE   Date of Procedure:  08/06/2021  Preoperative Diagnosis: Severe Aortic Stenosis   Postoperative Diagnosis: Same   Procedure:   Transcatheter Aortic Valve Replacement - Percutaneous Right Transfemoral Approach  Edwards Sapien 3 Ultra THV (size 23 mm, model # 9750TFX, serial # [id number])   Co-Surgeons:  Gaye Pollack, MD and Sherren Mocha, MD and Lenna Sciara, Md   Anesthesiologist:  C. Ermalene Postin, MD  Echocardiographer:  Salley Hews  Pre-operative Echo Findings: Severe aortic stenosis Normal left ventricular systolic function  Post-operative Echo Findings: NO paravalvular leak Normal left ventricular systolic function   BRIEF CLINICAL NOTE AND INDICATIONS FOR SURGERY  This 80 year old woman has stage D, severe, symptomatic aortic stenosis with New York Heart Association class II symptoms of exertional fatigue and shortness of breath consistent with chronic diastolic congestive heart failure.  I have personally reviewed her 2D echocardiogram, cardiac catheterization, and CTA studies.  Her echocardiogram in April 2022 showed a trileaflet aortic valve that was heavily calcified and thickened with restricted leaflet mobility.  The mean gradient was 41 mmHg with a peak gradient of 63 mmHg.  Dimensionless index was 0.2 with a valve area of 0.63 cm.  Left ventricular systolic function was normal.  Cardiac catheterization showed significant single-vessel coronary disease involving the left circumflex and obtuse marginal branch.  With no angina this disease can be treated medically.  I agree that aortic valve replacement is indicated in this patient for relief of her symptoms and to prevent progressive left ventricular deterioration.  Given her age and debilitation from a long history of psoriatic arthritis I think the best option for her would be transcatheter aortic valve replacement.  Her gated  cardiac CTA shows anatomy suitable for TAVR using a SAPIEN 3 valve.  Her abdominal and pelvic CTA shows some calcified atherosclerosis of the proximal common iliac arteries and relatively small iliac and femoral vessels but I think she probably has adequate pelvic vascular access.   The patient and her husband were counseled at length regarding treatment alternatives for management of severe symptomatic aortic stenosis. The risks and benefits of surgical intervention has been discussed in detail. Long-term prognosis with medical therapy was discussed. Alternative approaches such as conventional surgical aortic valve replacement, transcatheter aortic valve replacement, and palliative medical therapy were compared and contrasted at length. This discussion was placed in the context of the patient's own specific clinical presentation and past medical history. All of their questions have been addressed.    Following the decision to proceed with transcatheter aortic valve replacement, a discussion was held regarding what types of management strategies would be attempted intraoperatively in the event of life-threatening complications, including whether or not the patient would be considered a candidate for the use of cardiopulmonary bypass and/or conversion to open sternotomy for attempted surgical intervention.  I think she would be a candidate for emergent sternotomy to manage any intraoperative complications although she would have a very difficult recovery given her debilitation from psoriatic arthritis.  The patient is aware of the fact that transient use of cardiopulmonary bypass may be necessary. The patient has been advised of a variety of complications that might develop including but not limited to risks of death, stroke, paravalvular leak, aortic dissection or other major vascular complications, aortic annulus rupture, device embolization, cardiac rupture or perforation, mitral regurgitation, acute  myocardial infarction, arrhythmia, heart block or bradycardia requiring permanent pacemaker placement, congestive heart failure,  respiratory failure, renal failure, pneumonia, infection, other late complications related to structural valve deterioration or migration, or other complications that might ultimately cause a temporary or permanent loss of functional independence or other long term morbidity. The patient provides full informed consent for the procedure as described and all questions were answered.       DETAILS OF THE OPERATIVE PROCEDURE  PREPARATION:    The patient was brought to the operating room on the above mentioned date and appropriate monitoring was established by the anesthesia team. The patient was placed in the supine position on the operating table.  Intravenous antibiotics were administered. The patient was monitored closely throughout the procedure under conscious sedation.    Baseline transthoracic echocardiogram was performed. The patient's abdomen and both groins were prepped and draped in a sterile manner. A time out procedure was performed.   PERIPHERAL ACCESS:    Using the modified Seldinger technique, femoral arterial and venous access was obtained with placement of 6 Fr sheaths on the left side.  A pigtail diagnostic catheter was passed through the left arterial sheath under fluoroscopic guidance into the aortic root.  A temporary transvenous pacemaker catheter was passed through the left femoral venous sheath under fluoroscopic guidance into the right ventricle.  The pacemaker was tested to ensure stable lead placement and pacemaker capture. Aortic root angiography was performed in order to determine the optimal angiographic angle for valve deployment.   TRANSFEMORAL ACCESS:   Percutaneous transfemoral access and sheath placement was performed using ultrasound guidance.  The right common femoral artery was cannulated using a micropuncture needle and appropriate  location was verified using hand injection angiogram.  A pair of Abbott Perclose percutaneous closure devices were placed and a 6 French sheath replaced into the femoral artery.  The patient was heparinized systemically and ACT verified > 250 seconds.    A 14 Fr transfemoral E-sheath was introduced into the right common femoral artery after progressively dilating over an Amplatz superstiff wire. An AL-1 catheter was used to direct a straight-tip exchange length wire across the native aortic valve into the left ventricle. This was exchanged out for a pigtail catheter and position was confirmed in the LV apex. Simultaneous LV and Ao pressures were recorded.  The pigtail catheter was exchanged for an Amplatz Extra-stiff wire in the LV apex.   BALLOON AORTIC VALVULOPLASTY:   Not performed   TRANSCATHETER HEART VALVE DEPLOYMENT:   An Edwards Sapien 3 Ultra transcatheter heart valve (size 23 mm) was prepared and crimped per manufacturer's guidelines, and the proper orientation of the valve is confirmed on the [employer] delivery system. The valve was advanced through the introducer sheath using normal technique until in an appropriate position in the abdominal aorta beyond the sheath tip. The balloon was then retracted and using the fine-tuning wheel was centered on the valve. The valve was then advanced across the aortic arch using appropriate flexion of the catheter. The valve was carefully positioned across the aortic valve annulus. The Commander catheter was retracted using normal technique. Once final position of the valve has been confirmed by angiographic assessment, the valve is deployed while temporarily holding ventilation and during rapid ventricular pacing to maintain systolic blood pressure < 50 mmHg and pulse pressure < 10 mmHg. The balloon inflation is held for >3 seconds after reaching full deployment volume. Once the balloon has fully deflated the balloon is retracted into the ascending  aorta and valve function is assessed using echocardiography. There is felt to be  no paravalvular leak and no central aortic insufficiency.  The patient's hemodynamic recovery following valve deployment is good.  The deployment balloon and guidewire are both removed.    PROCEDURE COMPLETION:   The sheath was removed and femoral artery closure performed.  Protamine was administered once femoral arterial repair was complete. The temporary pacemaker, pigtail catheters and femoral sheaths were removed with manual pressure used for hemostasis.  A Mynx femoral closure device was utilized following removal of the diagnostic sheath in the left femoral artery.  The patient reported pain in the left lower extremity and peripheral angiography was performed showing occlusion of the left common femoral artery. Dr. Donzetta Matters of vascular surgery was consulted  and performed operative repair.  The patient tolerated the procedure well and is transported to the cath lab recovery area in stable condition. There were no immediate intraoperative complications. All sponge instrument and needle counts are verified correct at completion of the operation.   No blood products were administered during the operation.  The patient received a total of 71 mL of intravenous contrast during the procedure.   Gaye Pollack, MD 08/06/2021

## 2021-08-06 NOTE — Progress Notes (Signed)
  Echocardiogram 2D Echocardiogram has been performed.  Sydney Reid 08/06/2021, 1:58 PM

## 2021-08-06 NOTE — Op Note (Signed)
[facility]  TAVR OPERATIVE NOTE   Date of Procedure:  08/06/2021  Preoperative Diagnosis: Severe Aortic Stenosis   Postoperative Diagnosis: Same   Procedure:   Transcatheter Aortic Valve Replacement - Transfemoral Approach Edwards Sapien 3 THV (size  67mm, model # [id number], serial # [id number]); complicated by left femoral occlusion requiring operative repair   Co-Surgeons:  Lenna Sciara, MD and Gaye Pollack, MD; proctoring Sherren Mocha, MD  Anesthesiologist:  Oleta Mouse, MD  Echocardiographer:  Trellis Moment, MD  Pre-operative Echo Findings: Severe aortic stenosis Normal left ventricular systolic function  Post-operative Echo Findings: No paravalvular leak Unchanged left ventricular systolic function   BRIEF CLINICAL NOTE AND INDICATIONS FOR SURGERY  The patient is an 80 year old woman with a history of psoriatic arthritis on immunotherapy, obstructive sleep apnea on CPAP, hypercholesterolemia, and severe aortic stenosis who presents with dyspnea on exertion and fatigue.  She was referred for consideration for aortic valve intervention.  During the course of the patient's preoperative work up they have been evaluated comprehensively by a multidisciplinary team of specialists coordinated through the [facility] in the [facility].  They have been demonstrated to suffer from symptomatic severe aortic stenosis as noted above. The patient has been counseled extensively as to the relative risks and benefits of all options for the treatment of severe aortic stenosis including long term medical therapy, conventional surgery for aortic valve replacement, and transcatheter aortic valve replacement.  The patient has been independently evaluated by Dr. Cyndia Bent with CT surgery and they are felt to be at high risk for conventional surgical aortic valve replacement. The surgeon indicated the patient would be  a poor candidate for conventional surgery. Based upon review of all of the patient's preoperative diagnostic tests they are felt to be candidate for transcatheter aortic valve replacement using the transfemoral approach as an alternative to high risk conventional surgery.    Following the decision to proceed with transcatheter aortic valve replacement, a discussion has been held regarding what types of management strategies would be attempted intraoperatively in the event of life-threatening complications, including whether or not the patient would be considered a candidate for the use of cardiopulmonary bypass and/or conversion to open sternotomy for attempted surgical intervention.  The patient has been advised of a variety of complications that might develop peculiar to this approach including but not limited to risks of death, stroke, paravalvular leak, aortic dissection or other major vascular complications, aortic annulus rupture, device embolization, cardiac rupture or perforation, acute myocardial infarction, arrhythmia, heart block or bradycardia requiring permanent pacemaker placement, congestive heart failure, respiratory failure, renal failure, pneumonia, infection, other late complications related to structural valve deterioration or migration, or other complications that might ultimately cause a temporary or permanent loss of functional independence or other long term morbidity.  The patient provides full informed consent for the procedure as described and all questions were answered preoperatively.    DETAILS OF THE OPERATIVE PROCEDURE  PREPARATION:   The patient is brought to the operating room on the above mentioned date and central monitoring was established by the anesthesia team including placement of a radial arterial line. The patient is placed in the supine position on the operating table.  Intravenous antibiotics are administered. Conscious sedation is used.   Baseline  transthoracic echocardiogram was performed. The patient's chest, abdomen, both groins, and both lower extremities are prepared and draped in a sterile manner. A time out procedure is performed.  PERIPHERAL ACCESS:   Using the modified Seldinger technique, femoral arterial and venous access were obtained with placement of a 6 Fr sheath in the artery and a 7 Fr sheath in the vein on the right side using u/s guidance.  A pigtail diagnostic catheter was passed through the femoral arterial sheath under fluoroscopic guidance into the aortic root.  A temporary transvenous pacemaker catheter was passed through the femoral venous sheath under fluoroscopic guidance into the right ventricle.  The pacemaker was tested to ensure stable lead placement and pacemaker capture. Aortic root angiography was performed in order to determine the optimal angiographic angle for valve deployment.  TRANSFEMORAL ACCESS:  A micropuncture kit was used to gain access to the left femoral artery using u/s guidance. Position confirmed with angiography. Pre-closure with double ProGlide closure devices. The patient was heparinized systemically and ACT verified > 250 seconds.    A 14Fr transfemoral E-sheath was introduced into the left femoral artery after progressively dilating over an Amplatz superstiff wire. An AL-1 catheter was used to direct a straight-tip exchange length wire across the native aortic valve into the left ventricle. This was exchanged out for a pigtail catheter and position was confirmed in the LV apex.  Simultaneous LV and Ao pressures were recorded.  The pigtail catheter was then exchanged for an Amplatz Extra-stiff wire in the LV apex.   TRANSCATHETER HEART VALVE DEPLOYMENT:  An Edwards Sapien 3 THV (size 23 mm) was prepared and crimped per manufacturer's guidelines, and the proper orientation of the valve is confirmed on the [employer] delivery system. The valve was advanced through the introducer sheath  using normal technique until in an appropriate position in the abdominal aorta beyond the sheath tip. The balloon was then retracted and using the fine-tuning wheel was centered on the valve. The valve was then advanced across the aortic arch using appropriate flexion of the catheter. The valve was carefully positioned across the aortic valve annulus. The Commander catheter was retracted using normal technique. Once final position of the valve has been confirmed by angiographic assessment, the valve is deployed while temporarily holding ventilation and during rapid ventricular pacing to maintain systolic blood pressure < 50 mmHg and pulse pressure < 10 mmHg. The balloon inflation is held for >3 seconds after reaching full deployment volume. Once the balloon has fully deflated the balloon is retracted into the ascending aorta and valve function is assessed using TTE. There is felt to be no paravalvular leak and no central aortic insufficiency.  The patient's hemodynamic recovery following valve deployment is good.  The deployment balloon and guidewire are both removed. Echo demostrated acceptable post-procedural gradients, stable mitral valve function, and no AI.   PROCEDURE COMPLETION:  The sheath was then removed and closure devices were completed. Protamine was administered once femoral arterial repair was complete. The temporary pacemaker, pigtail catheters and femoral sheaths were removed with a Mynx closure device placed in the artery and manual pressure used for venous hemostasis.    The patient reported left lower extremity pain and peripheral angiography was pursued.  This demonstrated that the left external iliac after was occluded.  Dr. Donzetta Matters of Vascular Surgery was consulted and after review of imaging, was referred for an operative repair. All sponge instrument and needle counts are verified correct at completion of the operation.   No blood products were administered during the operation.  The  patient received a total of 71 mL of intravenous contrast during the procedure.  Early Osmond MD  08/06/2021 3:05 PM

## 2021-08-07 ENCOUNTER — Encounter (HOSPITAL_COMMUNITY): Payer: Self-pay | Admitting: Internal Medicine

## 2021-08-07 ENCOUNTER — Inpatient Hospital Stay (HOSPITAL_COMMUNITY): Payer: Medicare Other

## 2021-08-07 DIAGNOSIS — Z9889 Other specified postprocedural states: Secondary | ICD-10-CM

## 2021-08-07 DIAGNOSIS — Z23 Encounter for immunization: Secondary | ICD-10-CM | POA: Diagnosis present

## 2021-08-07 DIAGNOSIS — Z006 Encounter for examination for normal comparison and control in clinical research program: Secondary | ICD-10-CM | POA: Diagnosis not present

## 2021-08-07 DIAGNOSIS — L4050 Arthropathic psoriasis, unspecified: Secondary | ICD-10-CM | POA: Diagnosis not present

## 2021-08-07 DIAGNOSIS — Z952 Presence of prosthetic heart valve: Secondary | ICD-10-CM

## 2021-08-07 DIAGNOSIS — I350 Nonrheumatic aortic (valve) stenosis: Secondary | ICD-10-CM | POA: Diagnosis not present

## 2021-08-07 LAB — CBC
HCT: 39.5 % (ref 36.0–46.0)
Hemoglobin: 12.1 g/dL (ref 12.0–15.0)
MCH: 25.5 pg — ABNORMAL LOW (ref 26.0–34.0)
MCHC: 30.6 g/dL (ref 30.0–36.0)
MCV: 83.3 fL (ref 80.0–100.0)
Platelets: 188 10*3/uL (ref 150–400)
RBC: 4.74 MIL/uL (ref 3.87–5.11)
RDW: 15 % (ref 11.5–15.5)
WBC: 8.3 10*3/uL (ref 4.0–10.5)
nRBC: 0 % (ref 0.0–0.2)

## 2021-08-07 LAB — BASIC METABOLIC PANEL
Anion gap: 8 (ref 5–15)
BUN: 10 mg/dL (ref 8–23)
CO2: 20 mmol/L — ABNORMAL LOW (ref 22–32)
Calcium: 8.6 mg/dL — ABNORMAL LOW (ref 8.9–10.3)
Chloride: 109 mmol/L (ref 98–111)
Creatinine, Ser: 0.85 mg/dL (ref 0.44–1.00)
GFR, Estimated: >60 mL/min (ref >60)
Glucose, Bld: 139 mg/dL — ABNORMAL HIGH (ref 70–99)
Potassium: 4.3 mmol/L (ref 3.5–5.1)
Sodium: 137 mmol/L (ref 135–145)

## 2021-08-07 MED ORDER — CLOPIDOGREL BISULFATE 75 MG PO TABS: 75.0000 mg | ORAL_TABLET | Freq: Every day | ORAL | 0 refills | Status: DC

## 2021-08-07 MED FILL — Potassium Chloride Inj 2 mEq/ML: INTRAVENOUS | Qty: 40 | Status: AC

## 2021-08-07 MED FILL — Magnesium Sulfate Inj 50%: INTRAMUSCULAR | Qty: 10 | Status: AC

## 2021-08-07 MED FILL — Heparin Sodium (Porcine) Inj 1000 Unit/ML: Qty: 1000 | Status: AC

## 2021-08-07 NOTE — Progress Notes (Signed)
Discharge instructions reviewed with pt and her husband.  Copy of instructions given to pt. Informed that script sent to her home pharmacy.  Pt d/c'd via wheelchair with belongings, with husband.           Escorted by unit staff/NT.

## 2021-08-07 NOTE — Progress Notes (Signed)
Echocardiogram 2D Echocardiogram has been performed.  Oneal Deputy Ruthy Forry RDCS 08/07/2021, 10:08 AM

## 2021-08-07 NOTE — Progress Notes (Signed)
Walked with MT, did have elevated HR and SOB toward end. Discussed with pt and husband restrictions, walking, and CRPII. Pt is interested in CRPII as she has been unable to walk long distance due to her back in recent years. Will refer to [place].  [place] CES, ACSM 12:18 PM 08/07/2021

## 2021-08-07 NOTE — Discharge Summary (Addendum)
[facility] [facility] VALVE TEAM  Discharge Summary    Patient ID: Sydney Reid MRN: [id number]; DOB: 29-Feb-1941  Admit date: 08/06/2021 Discharge date: 08/07/2021  Primary Care Provider: [name], NP  Primary Cardiologist: Dr. Einar Gip / Dr. Burt Knack & Dr. 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[place])   Hypothyroidism due to acquired atrophy of thyroid   Polycythemia vera ([place])   Severe aortic stenosis   Allergies Allergies  Allergen Reactions   Codeine Nausea And Vomiting    Weakness and dysequilibrium   Statins Hives    Myalgias     Diagnostic Studies/Procedures    [facility]  TAVR OPERATIVE NOTE     Date of Procedure:                08/06/2021   Preoperative Diagnosis:      Severe Aortic Stenosis    Postoperative Diagnosis:    Same    Procedure:        Transcatheter Aortic Valve Replacement - Transfemoral Approach Edwards Sapien 3 THV (size  30mm, model # [id number], serial # [id number]); complicated by left femoral occlusion requiring operative repair              Co-Surgeons:  Lenna Sciara, MD and Gaye Pollack, MD; proctoring Sherren Mocha, MD   Anesthesiologist:  Oleta Mouse, MD   Echocardiographer:   Trellis Moment, MD   Pre-operative Echo Findings: Severe aortic stenosis Normal left ventricular systolic function   Post-operative Echo Findings: No paravalvular leak Unchanged left ventricular systolic function _____________    Echo 08/07/21: completed but pending formal read at the time of discharge   History of Present Illness     Sydney Reid is a 80 y.o. female with a history of of psoriatic arthritis, OSA on CPAP, HLD, CAD, polycythemia vera and aortic stenosis who presented to [facility] on  08/06/21 for planned TAVR.   She has remained relatively active considering her arthritis but reports progressive exertional shortness of breath and fatigue. She has been followed by Dr. Einar Gip for aortic stenosis. Her most recent echo on 02/19/2021 showed EF 60-65% with severe AS with a mean gradient of 41 mmHg with a valve area of 0.72 cm by VTI. Cardiac catheterization showed moderate stenosis of the left circumflex with 60 to 70% proximal stenosis and an 80% calcific stenosis of a third marginal branch.  The LAD had mild nonobstructive disease.  The RCA had mild calcific plaque but no stenosis.  The mean gradient at cath was 29 mmHg with a peak to peak gradient of 33 mmHg.  Aortic valve area was calculated at 0.88 cm.  Right heart pressures and cardiac index were normal.  The patient has been evaluated by the multidisciplinary valve team and felt to have severe, symptomatic aortic stenosis and to be a suitable candidate for TAVR, which was set up for 08/06/21.   Hospital Course     Consultants: none   Severe AS: s/p successful TAVR with a 23 mm Edwards Sapien 3 Ultra THV via the TF approach on 08/06/21. Post operative echo completed but pending formal read, but looks like EF is normal with normally functioning TAVR with a mean gradient of 12 mm hg and no PVL. Groin sites are stable. ECG with sinus and no high  grade heart block. Continued on Asprin with the addition of Plavix 75 mg daily x 3 months. Plan for discharge home today with close follow up in the office next week.  Acute left lower extremity ischemia: at completion of case she was having decreased sensation in her left foot with absent distal signals and angiography was performed which demonstrated occlusion of the left common femoral artery. She underwent successful left common femoral endarterectomy with vein patch angioplasty by Dr. Donzetta Matters. This looks good this morning. Vascular follow up to be arranged.   Psoriatic arthritis: resume home  Enbrel, which was held prior to surgery.  _____________  Discharge Vitals Blood pressure 97/62, pulse 70, temperature 97.9 F (36.6 C), temperature source Oral, resp. rate 18, height 5\' 5"  (1.651 m), weight 65.3 kg, SpO2 98 %.  Filed Weights   08/06/21 0851 08/07/21 0530  Weight: 65.3 kg 65.3 kg     GEN: Well nourished, well developed, in no acute distress HEENT: normal Neck: no JVD or masses Cardiac: RRR; very soft flow murmur @ RUSB. No rubs, or gallops,no edema  Respiratory:  clear to auscultation bilaterally, normal work of breathing GI: soft, nontender, nondistended, + BS MS: bilateral hand deformities.  Groin sites clear without hematoma or ecchymosis. Cut down healing well.  Skin: warm and dry, no rash Neuro:  Alert and Oriented x 3, Strength and sensation are intact Psych: euthymic mood, full affect   Labs & Radiologic Studies    CBC Recent Labs    08/06/21 1159 08/07/21 0143  WBC  --  8.3  HGB 13.6 12.1  HCT 40.0 39.5  MCV  --  83.3  PLT  --  644   Basic Metabolic Panel Recent Labs    08/06/21 1159 08/07/21 0143  NA 142 137  K 4.1 4.3  CL 108 109  CO2  --  20*  GLUCOSE 119* 139*  BUN 12 10  CREATININE 0.80 0.85  CALCIUM  --  8.6*   Liver Function Tests No results for input(s): AST, ALT, ALKPHOS, BILITOT, PROT, ALBUMIN in the last 72 hours. No results for input(s): LIPASE, AMYLASE in the last 72 hours. Cardiac Enzymes No results for input(s): CKTOTAL, CKMB, CKMBINDEX, TROPONINI in the last 72 hours. BNP Invalid input(s): POCBNP D-Dimer No results for input(s): DDIMER in the last 72 hours. Hemoglobin A1C No results for input(s): HGBA1C in the last 72 hours. Fasting Lipid Panel No results for input(s): CHOL, HDL, LDLCALC, TRIG, CHOLHDL, LDLDIRECT in the last 72 hours. Thyroid Function Tests No results for input(s): TSH, T4TOTAL, T3FREE, THYROIDAB in the last 72 hours.  Invalid input(s): FREET3 _____________  DG Chest 2 View  Result Date:  08/05/2021 CLINICAL DATA:  80 year old female under preoperative evaluation. EXAM: CHEST - 2 VIEW COMPARISON:  No priors. FINDINGS: Lung volumes are normal. No consolidative airspace disease. No pleural effusions. No pneumothorax. No pulmonary nodule or mass noted. Pulmonary vasculature and the cardiomediastinal silhouette are within normal limits. Atherosclerosis in the thoracic aorta. IMPRESSION: 1.  No radiographic evidence of acute cardiopulmonary disease. 2. Aortic atherosclerosis. Electronically Signed   By: Vinnie Langton M.D.   On: 08/05/2021 09:24   ECHOCARDIOGRAM LIMITED  Result Date: 08/06/2021    ECHOCARDIOGRAM LIMITED REPORT   Patient Name:   Sydney Reid Date of Exam: 08/06/2021 Medical Rec #:  [id number]          Height:       65.0 in Accession #:    [id number]  Weight:       144.0 lb Date of Birth:  June 30, 1941           BSA:          1.720 m Patient Age:    23 years           BP:           153/71 mmHg Patient Gender: F                  HR:           103 bpm. Exam Location:  Inpatient Procedure: 2D Echo, Cardiac Doppler and Color Doppler Indications:    Aortic stenosis  History:        Patient has prior history of Echocardiogram examinations, most                 recent 02/19/2021. CAD, Aortic Valve Disease and severe AS,                 Signs/Symptoms:Shortness of Breath; Risk Factors:Dyslipidemia.  Sonographer:    Dustin Flock RDCS Referring Phys: [id number] [place]  1. Interventional echocardiogram for TAVR Placement. Prior to procedure, the aortic valve was calcified with at least moderate aortic valve stenosis and mean gradient of 23 mm Hg. Status post implantation of a 23 mm Edwards Sapien Valve, there was no significant aortic stenosis with mean gradient of 3 mm Hg and no paravalular leak. Procedure Date: 08/06/2021.  2. Left ventricular ejection fraction, by estimation, is 60 to 65%. The left ventricle has normal function. The left ventricle has no  regional wall motion abnormalities.  3. The mitral valve is normal in structure. FINDINGS  Left Ventricle: Left ventricular ejection fraction, by estimation, is 60 to 65%. The left ventricle has normal function. The left ventricle has no regional wall motion abnormalities. Pericardium: Presence of pericardial fat pad. Mitral Valve: The mitral valve is normal in structure. Tricuspid Valve: The tricuspid valve is not well visualized. Tricuspid valve regurgitation is not demonstrated. Aortic Valve: The aortic valve is calcified. Moderate aortic stenosis is present. Aortic valve mean gradient measures 12.0 mmHg. Aortic valve peak gradient measures 13.5 mmHg. Aortic valve area, by VTI measures 1.36 cm. There is a 23 mm Sapien prosthetic, stented (TAVR) valve present in the aortic position. LEFT VENTRICLE PLAX 2D LVOT diam:     2.20 cm LV SV:         63 LV SV Index:   37 LVOT Area:     3.80 cm  AORTIC VALVE AV Area (Vmax):    1.42 cm AV Area (Vmean):   1.32 cm AV Area (VTI):     1.36 cm AV Vmax:           183.80 cm/s AV Vmean:          142.100 cm/s AV VTI:            0.467 m AV Peak Grad:      13.5 mmHg AV Mean Grad:      12.0 mmHg LVOT Vmax:         68.67 cm/s LVOT Vmean:        49.200 cm/s LVOT VTI:          0.166 m LVOT/AV VTI ratio: 0.36  SHUNTS Systemic VTI:  0.17 m Systemic Diam: 2.20 cm Rudean Haskell MD Electronically signed by Rudean Haskell MD Signature Date/Time: 08/06/2021/2:10:56 PM    Final    Disposition   Pt  is being discharged home today in good condition.  Follow-up Plans & Appointments     Follow-up Information     Eileen Stanford, PA-C. Go on 08/14/2021.   Specialties: Cardiology, Radiology Why: @ 3:30pm. Please arrive at least 10 minutes early. Contact information: [place] [address] [phone]                  Discharge Medications   Allergies as of 08/07/2021       Reactions   Codeine Nausea And Vomiting    Weakness and dysequilibrium   Statins Hives   Myalgias        Medication List     TAKE these medications    ammonium lactate 12 % lotion Commonly known as: LAC-HYDRIN Apply 1 application topically as needed for dry skin.   aspirin EC 81 MG tablet Take 81 mg by mouth daily. Swallow whole.   clopidogrel 75 MG tablet Commonly known as: PLAVIX Take 1 tablet (75 mg total) by mouth daily with breakfast. Start taking on: August 08, 2021   etanercept 50 MG/ML injection Commonly known as: ENBREL Inject 50 mg into the skin every Sunday.   ezetimibe 10 MG tablet Commonly known as: ZETIA Take 1 tablet (10 mg total) by mouth daily.   HYDROcodone-acetaminophen 5-325 MG tablet Commonly known as: Norco Take 1 tablet by mouth at bedtime.   levothyroxine 88 MCG tablet Commonly known as: SYNTHROID Take one tablet by mouth once daily 30 minutes before breakfast on an empty stomach for thyroid.   simvastatin 10 MG tablet Commonly known as: ZOCOR TAKE 1 TABLET BY MOUTH EVERYDAY AT BEDTIME   Vitamin D3 250 MCG (10000 UT) capsule Take 4,000 Units by mouth daily. Except none on Saturday and Sunday          Outstanding Labs/Studies   none  Duration of Discharge Encounter   Greater than 30 minutes including physician time.  Mable Fill, PA-C 08/07/2021, 10:43 AM [phone]  Patient seen, examined. Available data reviewed. Agree with findings, assessment, and plan as outlined by Nell Range, PA-C.  The patient is independently interviewed and examined.  She is alert, oriented, in no distress.  HEENT is normal, lungs are clear, heart is regular rate and rhythm with a soft systolic ejection murmur at the right upper sternal border, abdomen is soft and nontender, the right groin site is clear, the left femoral incision is clear without firm hematoma or erythema.  The feet are warm bilaterally and there is no pretibial edema.  The patient has done well with TAVR  complicated by femoral occlusion requiring patch angioplasty repair of the left femoral artery.  Fortunately, the patient has had a very good recovery and is medically stable to go home today.  Her postoperative day #1 echo is reviewed and shows normal function of her TAVR bioprosthesis, normal LV and RV function, and no pericardial effusion.  The patient's mean transvalvular gradient is 12 mmHg with no paravalvular regurgitation.  Postoperative instructions were reviewed with the patient and her husband today.  Sherren Mocha, M.D. 08/07/2021 3:14 PM

## 2021-08-07 NOTE — Progress Notes (Addendum)
[facility]  Subjective  - Doing well   Objective 100/64 76 97.9 F (36.6 C) (Oral) 18 100%  Intake/Output Summary (Last 24 hours) at 08/07/2021 0741 Last data filed at 08/07/2021 0409 Gross per 24 hour  Intake 1667.73 ml  Output 200 ml  Net 1467.73 ml    Left groin incision healing well, B groins soft without hematoma Good B doppler signals Lungs non labored breathing  Assessment/Planning: POD # 1  Procedure Performed: 1.  Left common femoral endarterectomy with vein patch angioplasty 2.  Mynx device closure right common femoral artery sheath F/U with VVS in 2-3 weeks for incision check Stable from a vascular point of view  Roxy Horseman 08/07/2021 7:41 AM --  Laboratory Lab Results: Recent Labs    08/06/21 1159 08/07/21 0143  WBC  --  8.3  HGB 13.6 12.1  HCT 40.0 39.5  PLT  --  188   BMET Recent Labs    08/06/21 1159 08/07/21 0143  NA 142 137  K 4.1 4.3  CL 108 109  CO2  --  20*  GLUCOSE 119* 139*  BUN 12 10  CREATININE 0.80 0.85  CALCIUM  --  8.6*    COAG Lab Results  Component Value Date   INR 0.9 08/02/2021   No results found for: PTT  I have independently interviewed and examined patient and agree with PA assessment and plan above.   Seyed Heffley C. Donzetta Matters, MD [facility] Office: [phone] Pager: [phone]

## 2021-08-07 NOTE — Progress Notes (Signed)
Mobility Specialist Progress Note    08/07/21 1052  Mobility  Activity Ambulated in hall  Level of Assistance Independent  Assistive Device None  Distance Ambulated (ft) 470 ft  Mobility Ambulated independently in hallway  Mobility Response Tolerated well  Mobility performed by Mobility specialist  $Mobility charge 1 Mobility   Pre-Mobility: 91 HR During Mobility: 142 HR  Pt received in bed and agreeable. C/o tightness at L groin site but no complaints otherwise. Monitor alerted to tachy rhythm on walk. Returned to bed with family member present in room.   Hildred Alamin Mobility Specialist  Mobility Specialist Phone: [phone]

## 2021-08-07 NOTE — Anesthesia Postprocedure Evaluation (Signed)
Anesthesia Post Note  Patient: SAMAR VENNEMAN  Procedure(s) Performed: TRANSCATHETER AORTIC VALVE REPLACEMENT, TRANSFEMORAL (Chest) INTRAOPERATIVE TRANSTHORACIC ECHOCARDIOGRAM LEFT LOWER EXTREMITY ANGIOGRAM GROIN EXPLORATION  PATCH ANGIOPLASTY OF FEMORAL ARTERY USING LEFT GREATER SAPHENOUS VEIN     Patient location during evaluation: PACU Anesthesia Type: General Level of consciousness: awake and alert Pain management: pain level controlled Vital Signs Assessment: post-procedure vital signs reviewed and stable Respiratory status: spontaneous breathing, nonlabored ventilation, respiratory function stable and patient connected to nasal cannula oxygen Cardiovascular status: blood pressure returned to baseline and stable Postop Assessment: no apparent nausea or vomiting Anesthetic complications: no   No notable events documented.  Last Vitals:  Vitals:   08/07/21 0404 08/07/21 0748  BP: 100/64 97/62  Pulse: 76 70  Resp: 18 18  Temp: 36.6 C 36.6 C  SpO2: 100% 98%    Last Pain:  Vitals:   08/07/21 0920  TempSrc:   PainSc: 0-No pain                 Farin Buhman

## 2021-08-08 ENCOUNTER — Telehealth: Payer: Self-pay | Admitting: Physician Assistant

## 2021-08-08 ENCOUNTER — Other Ambulatory Visit: Payer: Self-pay | Admitting: *Deleted

## 2021-08-08 ENCOUNTER — Telehealth: Payer: Self-pay | Admitting: *Deleted

## 2021-08-08 DIAGNOSIS — G894 Chronic pain syndrome: Secondary | ICD-10-CM

## 2021-08-08 DIAGNOSIS — L4052 Psoriatic arthritis mutilans: Secondary | ICD-10-CM

## 2021-08-08 LAB — ECHOCARDIOGRAM COMPLETE
AR max vel: 1.35 cm2
AV Area VTI: 1.71 cm2
AV Area mean vel: 1.73 cm2
AV Mean grad: 12 mmHg
AV Peak grad: 25.6 mmHg
Ao pk vel: 2.53 m/s
Area-P 1/2: 2.91 cm2
Height: 65 in
S' Lateral: 1.9 cm
Weight: 2303.37 oz

## 2021-08-08 LAB — ECHOCARDIOGRAM LIMITED
AR max vel: 1.42 cm2
AV Area VTI: 1.36 cm2
AV Area mean vel: 1.32 cm2
AV Mean grad: 12 mmHg
AV Peak grad: 13.5 mmHg
Ao pk vel: 1.84 m/s

## 2021-08-08 MED ORDER — HYDROCODONE-ACETAMINOPHEN 5-325 MG PO TABS: 1.0000 | ORAL_TABLET | Freq: Two times a day (BID) | ORAL | 0 refills | Status: DC

## 2021-08-08 NOTE — Telephone Encounter (Signed)
We did discuss this and I am agreeable to increase hydrocodone-apap during this time. Will continue to monitor and she will only use if needed

## 2021-08-08 NOTE — Telephone Encounter (Signed)
  [place] VALVE TEAM   Patient contacted regarding discharge from [facility] on 10/12  Patient understands to follow up with provider Nell Range on 10/19 at [place].  Patient understands discharge instructions? yes Patient understands medications and regimen? yes Patient understands to bring all medications to this visit? yes  Angelena Form PA-C  [facility]

## 2021-08-08 NOTE — Telephone Encounter (Signed)
I called patient for a TOC Call. Documented.  Patient stated that she has had to take 2 Hydrocodone a day because she cannot take her Enbrel until 08/23/2021 because of the surgery she just had.   Patient stated that she is having a lot of Arthritis pain.   Requesting to take her Hydrocodone Twice daily instead of Once.   Last Epic RF 07/11/2021 Contract on File.  Please Advise.

## 2021-08-08 NOTE — Telephone Encounter (Signed)
Transition Care Management Follow-up Telephone Call Date of discharge and from where: 08/07/2021 [place] How have you been since you were released from the hospital? Sore in the groin. In pain due to Arthritis because she cannot take her Enbrel until 10/28 Any questions or concerns? Yes Requesting to increase her Hydrocodone to 2/day. (Refill sent to [facility])  Items Reviewed: Did the pt receive and understand the discharge instructions provided? Yes  Medications obtained and verified? Yes  Other? No  Any new allergies since your discharge? No  Dietary orders reviewed? Yes Do you have support at home? Yes   Home Care and Equipment/Supplies: Were home health services ordered? no If so, what is the name of the agency? na  Has the agency set up a time to come to the patient's home? not applicable Were any new equipment or medical supplies ordered?  No What is the name of the medical supply agency? na Were you able to get the supplies/equipment? not applicable Do you have any questions related to the use of the equipment or supplies? No  Functional Questionnaire: (I = Independent and D = Dependent) ADLs: I  Bathing/Dressing- I  Meal Prep- I  Eating- I  Maintaining continence- I  Transferring/Ambulation- I  Managing Meds- I  Follow up appointments reviewed:  PCP Hospital f/u appt confirmed? Yes  Patient does not wants to schedule a sooner appointment than the 09/02/2021 appointment because she has other appointments that she has to be at right now.  [facility] f/u appt confirmed? Yes  Scheduled to see Cardiologist on 10/19 Are transportation arrangements needed? No  If their condition worsens, is the pt aware to call PCP or go to the Emergency Dept.? Yes Was the patient provided with contact information for the PCP's office or ED? Yes Was to pt encouraged to call back with questions or concerns? Yes

## 2021-08-09 ENCOUNTER — Encounter (HOSPITAL_COMMUNITY): Payer: Self-pay | Admitting: Internal Medicine

## 2021-08-09 ENCOUNTER — Ambulatory Visit: Payer: Medicare Other | Admitting: Cardiology

## 2021-08-13 ENCOUNTER — Ambulatory Visit: Payer: Medicare Other | Admitting: Internal Medicine

## 2021-08-13 ENCOUNTER — Other Ambulatory Visit: Payer: Medicare Other

## 2021-08-13 ENCOUNTER — Ambulatory Visit: Payer: Medicare Other

## 2021-08-13 ENCOUNTER — Telehealth: Payer: Self-pay

## 2021-08-13 ENCOUNTER — Telehealth (HOSPITAL_COMMUNITY): Payer: Self-pay

## 2021-08-13 NOTE — Telephone Encounter (Signed)
Pt insurance is active and benefits verified through Medicare a/b Co-pay Medicare a/b, DED $233/$233 met, out of pocket 0/0 met, co-insurance 20%. no pre-authorization required. Passport, 08/13/2021@1 :58pm, REF# [id number]  2ndary insurance is active and benefits verified through [facility]. Co-pay 0, DED 0/0 met, out of pocket 0/0 met, co-insurance 0%. No pre-authorization required. Passport, 08/13/2021@1 :53pm, REF# [id number]   Will contact patient to see if she is interested in the Cardiac Rehab Program. If interested, patient will need to complete follow up appt. Once completed, patient will be contacted for scheduling upon review by the RN Navigator.

## 2021-08-13 NOTE — Telephone Encounter (Signed)
Pt is s/p left common femoral endarterectomy on 08/06/21. Pt called concerned about left leg swelling extending down towards her foot on yesterday evening. Reports she went to church, attended a meeting that lasted four hours, and walked for five minutes intermittently throughout the day. She denied any additional symptoms. This morning she reports the swelling has subsided.   Advised pt because her feet were hanging down for an extended period of time while sitting after surgery, it is not uncommon for swelling to occur. Advised to avoid sitting for long periods without moving, taking short walk breaks every 1-2 hours, and elevate legs above level of heart while sitting or lying to improve blood flow and decrease swelling. Pt verbalized understanding. She will follow up as scheduled on 08/28/21 and contact office back earlier if needed.

## 2021-08-13 NOTE — Telephone Encounter (Signed)
Called patient to see if she is interested in the Cardiac Rehab Program. Patient expressed interest. Explained scheduling process and went over insurance, patient verbalized understanding. Will contact patient for scheduling once f/u has been completed. 

## 2021-08-13 NOTE — Progress Notes (Signed)
HE[facility] AND [place]                                     Cardiology Office Note:    Date:  08/14/2021   ID:  Sydney Reid, DOB September 30, 1941, MRN [id number]  PCP:  Sydney Chandler, NP  [facility] Cardiologist: Dr. Einar Gip / Dr. Burt Knack & Dr. Cyndia Bent (TAVR) [facility] Electrophysiologist:  None   Referring MD: Sydney Chandler, NP   [facility] s/p TAVR  History of Present Illness:    Sydney Reid is a 80 y.o. female with a hx of psoriatic arthritis, OSA on CPAP, HLD, CAD, polycythemia vera and aortic stenosis s/p TAVR (08/06/21) who presents to clinic for follow up.   She has remained relatively active considering her arthritis but reports progressive exertional shortness of breath and fatigue. She has been followed by Dr. Einar Gip for aortic stenosis. Her most recent echo on 02/19/2021 showed EF 60-65% with severe AS with a mean gradient of 41 mmHg with a valve area of 0.72 cm by VTI. Cardiac catheterization showed moderate stenosis of the left circumflex with 60 to 70% proximal stenosis and an 80% calcific stenosis of a third marginal branch.  The LAD had mild nonobstructive disease.  The RCA had mild calcific plaque but no stenosis.  The mean gradient at cath was 29 mmHg with a peak to peak gradient of 33 mmHg.  Aortic valve area was calculated at 0.88 cm.  Right heart pressures and cardiac index were normal.   She was evaluated by the multidisciplinary valve team and underwent successful TAVR with a 23 mm Edwards Sapien 3 Ultra THV via the TF approach on 08/06/21. At completion of case she was having decreased sensation in her left foot with absent distal signals and angiography was performed which demonstrated occlusion of the left common femoral artery. She underwent successful left common femoral endarterectomy with vein patch angioplasty by Dr. Donzetta Matters. Post operative echo showed EF 60%, normally functioning TAVR with a mean gradient of  12 mmHg and no PVL. She was continued on Asprin with the addition of Plavix 75 mg daily x 3 months.  Today the patient presents to clinic for follow up. Has a little dizziness on a occasion that occurs sporadically. No syncope. Had swelling in left leg one day after doing too much. Resolved by next day and now normal.  No CP or SOB. No LE edema, orthopnea or PND. No blood in stool or urine. No palpitations.    Past Medical History:  Diagnosis Date   Complication of anesthesia    Difficult intubation    20-25 years ago   H/O total knee replacement 10/28/1999   Right   High cholesterol    History of tonsillectomy and adenoidectomy 10/27/1944   History of transcatheter aortic valve replacement (TAVR) 08/06/2021   s/p Edwards 92mm S3 vis TF approach with Dr. Burt Knack and Dr. Cyndia Bent   Psoriatic arthritis [facility]) 10/27/1988   Sleep apnea with use of continuous positive airway pressure (CPAP) 10/27/2002   Thyroid disease     Past Surgical History:  Procedure Laterality Date   CATARACT EXTRACTION W/ INTRAOCULAR LENS IMPLANT Bilateral    FOOT SURGERY     fused arch in left foot   GROIN DISSECTION  08/06/2021   Procedure: Virl Son EXPLORATION ;  Surgeon: Waynetta Sandy, MD;  Location: [facility]  OR;  Service: Vascular;;   INTRAOPERATIVE TRANSTHORACIC ECHOCARDIOGRAM N/A 08/06/2021   Procedure: INTRAOPERATIVE TRANSTHORACIC ECHOCARDIOGRAM;  Surgeon: Early Osmond, MD;  Location: [place];  Service: Cardiovascular;  Laterality: N/A;   LOWER EXTREMITY ANGIOGRAM  08/06/2021   Procedure: LEFT LOWER EXTREMITY ANGIOGRAM;  Surgeon: Early Osmond, MD;  Location: [place];  Service: Cardiovascular;;   PATCH ANGIOPLASTY  08/06/2021   Procedure: PATCH ANGIOPLASTY OF FEMORAL ARTERY USING LEFT GREATER SAPHENOUS VEIN;  Surgeon: Waynetta Sandy, MD;  Location: [place];  Service: Vascular;;   RIGHT/LEFT HEART CATH AND CORONARY ANGIOGRAPHY N/A 04/16/2021   Procedure: RIGHT/LEFT HEART CATH AND CORONARY  ANGIOGRAPHY;  Surgeon: Adrian Prows, MD;  Location: [place] CV [facility];  Service: Cardiovascular;  Laterality: N/A;   SPINE SURGERY     TONSILLECTOMY     TOTAL KNEE ARTHROPLASTY Left    TRANSCATHETER AORTIC VALVE REPLACEMENT, TRANSFEMORAL N/A 08/06/2021   Procedure: TRANSCATHETER AORTIC VALVE REPLACEMENT, TRANSFEMORAL;  Surgeon: Early Osmond, MD;  Location: [facility] OR;  Service: Cardiovascular;  Laterality: N/A;    Current Medications: Current Meds  Medication Sig   ammonium lactate (LAC-HYDRIN) 12 % lotion Apply 1 application topically as needed for dry skin.   amoxicillin (AMOXIL) 500 MG tablet Take 1 tablet (500 mg total) by mouth as directed. Take 4 tablets by mouth 1 hour prior to dental work and cleanings   aspirin EC 81 MG tablet Take 81 mg by mouth daily. Swallow whole.   Cholecalciferol (VITAMIN D3) 250 MCG (10000 UT) capsule Take 4,000 Units by mouth daily.   clopidogrel (PLAVIX) 75 MG tablet Take 1 tablet (75 mg total) by mouth daily with breakfast.   etanercept (ENBREL) 50 MG/ML injection Inject 50 mg into the skin every Sunday.    ezetimibe (ZETIA) 10 MG tablet Take 1 tablet (10 mg total) by mouth daily.   HYDROcodone-acetaminophen (NORCO) 5-325 MG tablet Take 1 tablet by mouth 2 (two) times daily.   levothyroxine (SYNTHROID) 88 MCG tablet Take one tablet by mouth once daily 30 minutes before breakfast on an empty stomach for thyroid.   simvastatin (ZOCOR) 10 MG tablet TAKE 1 TABLET BY MOUTH EVERYDAY AT BEDTIME     Allergies:   Codeine and Statins   Social History   Socioeconomic History   Marital status: Married    Spouse name: Not on file   Number of children: Not on file   Years of education: Not on file   Highest education level: Not on file  Occupational History   Not on file  Tobacco Use   Smoking status: Former    Packs/day: 2.00    Years: 18.00    Pack years: 36.00    Types: Cigarettes    Quit date: 22    Years since quitting: 47.8   Smokeless tobacco:  Never  Vaping Use   Vaping Use: Never used  Substance and Sexual Activity   Alcohol use: Yes    Alcohol/week: 1.0 - 2.0 standard drink    Types: 1 - 2 Standard drinks or equivalent per week    Comment: Social drinker, couple times a week   Drug use: No   Sexual activity: Not on file  Other Topics Concern   Not on file  Social History Narrative   Diet? Normal      Do you drink/eat things with caffeine? Yes, 1 mt. Dew per day      Marital status?  m                      What year were you married? 1988      Do you live in a house, apartment, assisted living, condo, trailer, etc.? Townhouse      Is it one or more stories? 2 story      How many persons live in your home? 2      Do you have any pets in your home? (please list) no      Current or past profession: Is manager      Do you exercise?     No (did it for 50 years)                       Type & how often?      Do you have a living will? yes      Do you have a DNR form?        yes                          If not, do you want to discuss one?      Do you have signed POA/HPOA for forms?       Social Determinants of Health   Financial Resource Strain: Not on file  Food Insecurity: Not on file  Transportation Needs: Not on file  Physical Activity: Not on file  Stress: Not on file  Social Connections: Not on file     Family History: The patient's family history includes Heart attack in her father; Heart disease in her sister.  ROS:   Please see the history of present illness.    All other systems reviewed and are negative.  EKGs/Labs/Other Studies Reviewed:    The following studies were reviewed today:  [facility]  TAVR OPERATIVE NOTE     Date of Procedure:                08/06/2021   Preoperative Diagnosis:      Severe Aortic Stenosis    Postoperative Diagnosis:    Same    Procedure:        Transcatheter Aortic Valve Replacement - Transfemoral Approach Edwards Sapien 3 THV  (size  53mm, model # [id number], serial # [id number]); complicated by left femoral occlusion requiring operative repair              Co-Surgeons:  Lenna Sciara, MD and Gaye Pollack, MD; proctoring Sherren Mocha, MD   Anesthesiologist:  Oleta Mouse, MD   Echocardiographer:   Trellis Moment, MD   Pre-operative Echo Findings: Severe aortic stenosis Normal left ventricular systolic function   Post-operative Echo Findings: No paravalvular leak Unchanged left ventricular systolic function _____________     Echo 08/07/21:  IMPRESSIONS   1. The aortic valve has been replaced with a 23 mm Edward Sapien Valve.  Aortic valve regurgitation is not visualized. Aortic valve mean gradient  measures 12.0 mmHg. Aortic valve acceleration time measures 60 msec. DVI  of 0.54.   2. Post procedure peak gradient 26 mm Hg, mean gradient 12 mm Hg, DVI  0.54, EOA 1.7 cm2.   3. Left ventricular ejection fraction, by estimation, is 60 to 65%. The  left ventricle has normal function. The left ventricle has no regional  wall motion abnormalities. There is mild concentric left ventricular  hypertrophy. Left ventricular diastolic  parameters are consistent  with Grade I diastolic dysfunction (impaired  relaxation).   4. Right ventricular systolic function is normal. The right ventricular  size is normal. There is normal pulmonary artery systolic pressure.   5. The mitral valve is normal in structure. No evidence of mitral valve  regurgitation.   6. The inferior vena cava is normal in size with greater than 50%  respiratory variability, suggesting right atrial pressure of 3 mmHg.   Comparison(s): A prior study was performed on 08/06/2021. Stable  prosthetic valve findings.   EKG:  EKG is ordered today.  The ekg ordered today demonstrates sinus with HR 78  Recent Labs: 03/06/2021: TSH 1.11 08/02/2021: ALT 23 08/07/2021: BUN 10; Creatinine, Ser 0.85; Hemoglobin 12.1; Platelets 188;  Potassium 4.3; Sodium 137  Recent Lipid Panel    Component Value Date/Time   CHOL 155 06/06/2021 1012   TRIG 107 06/06/2021 1012   HDL 48 06/06/2021 1012   CHOLHDL 3.5 03/06/2021 0856   VLDL 23 04/09/2017 0844   LDLCALC 87 06/06/2021 1012   LDLCALC 114 (H) 03/06/2021 0856     Risk Assessment/Calculations:       Physical Exam:    VS:  BP 110/70 (BP Location: Left Arm, Patient Position: Sitting, Cuff Size: Normal)   Pulse 78   Ht 5\' 5"  (1.651 m)   Wt 146 lb (66.2 kg)   BMI 24.30 kg/m     Wt Readings from Last 3 Encounters:  08/14/21 146 lb (66.2 kg)  08/07/21 143 lb 15.4 oz (65.3 kg)  08/02/21 146 lb 8 oz (66.5 kg)     GEN: \ Well nourished, well developed in no acute distress HEENT: Normal NECK: No JVD; No carotid bruits LYMPHATICS: No lymphadenopathy CARDIAC: RRR, very soft flow murmur. No rubs, gallops RESPIRATORY:  Clear to auscultation without rales, wheezing or rhonchi  ABDOMEN: Soft, non-tender, non-distended MUSCULOSKELETAL: bilateral hand deformities  SKIN: Warm and dry.  Groin sites clear without hematoma or ecchymosis. Cut down healing well.  NEUROLOGIC:  Alert and oriented x 3 PSYCHIATRIC:  Normal affect   ASSESSMENT:    1. S/P TAVR (transcatheter aortic valve replacement)   2. Femoral artery occlusion, left (HCC)   3. Psoriatic arthritis, 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HCC)   4. Esophageal thickening    PLAN:    In order of problems listed above:  Severe AS s/p TAVR: doing great 1 week out from TAVR. Groin sites are healing well. ECG with no HAVB. SBE prophylaxis discussed; I have RX'd amoxicillin. Continue aspirin and plavix. I will see her back for 1 month follow up and echo.    S/p left femoral endarterectomy with vein patch angioplasty: healing well. Has VVS follow up early November.   Psoriatic arthritis: resume home Enbrel  Esophageal thickening: pre TAVR CT showed "moderate to large hiatal hernia with severe thickening of the middle to distal thirds  of the esophagus without discrete esophageal mass. This may simply reflect chronic reflux esophagitis, however, further evaluation with endoscopy should be considered if there is any clinical concern for esophageal metaplasia or neoplasia." She is not interested in a GI eval at this time.    Cardiac Rehabilitation Eligibility Assessment: The patient is ready to start Cardiac Rehab pending clearance from the cardiac surgeon.         Medication Adjustments/Labs and Tests Ordered: Current medicines are reviewed at length with the patient today.  Concerns regarding medicines are outlined above.  Orders Placed This Encounter  Procedures   EKG 12-Lead    Meds  ordered this encounter  Medications   amoxicillin (AMOXIL) 500 MG tablet    Sig: Take 1 tablet (500 mg total) by mouth as directed. Take 4 tablets by mouth 1 hour prior to dental work and cleanings    Dispense:  12 tablet    Refill:  12     Patient Instructions  Medication Instructions:  We have sent in a prescription for Amoxicillin 500 mg, Take 4 tablets by mouth 1 hour prior to dental work and cleanings   *If you need a refill on your cardiac medications before your next appointment, please call your pharmacy*   Lab Work: None ordered   If you have labs (blood work) drawn today and your tests are completely normal, you will receive your results only by: [place] (if you have MyChart) OR A paper copy in the mail If you have any lab test that is abnormal or we need to change your treatment, we will call you to review the results.   Testing/Procedures: None ordered    Follow-Up: Follow up as scheduled    Other Instructions None     Signed, Angelena Form, PA-C  08/14/2021 4:29 PM    [facility]

## 2021-08-14 ENCOUNTER — Ambulatory Visit: Payer: Medicare Other | Admitting: Internal Medicine

## 2021-08-14 ENCOUNTER — Encounter: Payer: Self-pay | Admitting: Physician Assistant

## 2021-08-14 ENCOUNTER — Other Ambulatory Visit: Payer: Medicare Other

## 2021-08-14 ENCOUNTER — Ambulatory Visit (INDEPENDENT_AMBULATORY_CARE_PROVIDER_SITE_OTHER): Payer: Medicare Other | Admitting: Physician Assistant

## 2021-08-14 ENCOUNTER — Other Ambulatory Visit: Payer: Self-pay

## 2021-08-14 VITALS — BP 110/70 | HR 78 | Ht 65.0 in | Wt 146.0 lb

## 2021-08-14 DIAGNOSIS — L4052 Psoriatic arthritis mutilans: Secondary | ICD-10-CM | POA: Diagnosis not present

## 2021-08-14 DIAGNOSIS — K2289 Other specified disease of esophagus: Secondary | ICD-10-CM

## 2021-08-14 DIAGNOSIS — Z952 Presence of prosthetic heart valve: Secondary | ICD-10-CM | POA: Diagnosis not present

## 2021-08-14 DIAGNOSIS — I70202 Unspecified atherosclerosis of native arteries of extremities, left leg: Secondary | ICD-10-CM | POA: Diagnosis not present

## 2021-08-14 MED ORDER — AMOXICILLIN 500 MG PO TABS: 500.0000 mg | ORAL_TABLET | ORAL | 12 refills | Status: DC

## 2021-08-14 NOTE — Patient Instructions (Addendum)
Medication Instructions:  We have sent in a prescription for Amoxicillin 500 mg, Take 4 tablets by mouth 1 hour prior to dental work and cleanings   *If you need a refill on your cardiac medications before your next appointment, please call your pharmacy*   Lab Work: None ordered   If you have labs (blood work) drawn today and your tests are completely normal, you will receive your results only by: [place] (if you have MyChart) OR A paper copy in the mail If you have any lab test that is abnormal or we need to change your treatment, we will call you to review the results.   Testing/Procedures: None ordered    Follow-Up: Follow up as scheduled    Other Instructions None

## 2021-08-19 DIAGNOSIS — Z20822 Contact with and (suspected) exposure to covid-19: Secondary | ICD-10-CM | POA: Diagnosis not present

## 2021-08-20 DIAGNOSIS — L603 Nail dystrophy: Secondary | ICD-10-CM | POA: Diagnosis not present

## 2021-08-20 DIAGNOSIS — I739 Peripheral vascular disease, unspecified: Secondary | ICD-10-CM | POA: Diagnosis not present

## 2021-08-20 DIAGNOSIS — L84 Corns and callosities: Secondary | ICD-10-CM | POA: Diagnosis not present

## 2021-08-20 DIAGNOSIS — B351 Tinea unguium: Secondary | ICD-10-CM | POA: Diagnosis not present

## 2021-08-22 ENCOUNTER — Telehealth: Payer: Self-pay | Admitting: Medical Oncology

## 2021-08-22 NOTE — Telephone Encounter (Signed)
Should she keep appt 08/26/21?  Started  Plavix after heart valve replacement  oct 11th and  wants to postpone her appt 3 months  or whatever  Dr Julien Nordmann advises.  "I think my blood is okay".

## 2021-08-26 ENCOUNTER — Inpatient Hospital Stay: Payer: Medicare Other

## 2021-08-26 ENCOUNTER — Inpatient Hospital Stay: Payer: Medicare Other | Admitting: Internal Medicine

## 2021-08-28 ENCOUNTER — Ambulatory Visit (INDEPENDENT_AMBULATORY_CARE_PROVIDER_SITE_OTHER): Payer: Medicare Other | Admitting: Physician Assistant

## 2021-08-28 ENCOUNTER — Other Ambulatory Visit: Payer: Self-pay

## 2021-08-28 VITALS — BP 119/74 | HR 97 | Temp 98.6°F | Resp 20 | Ht 65.0 in | Wt 144.0 lb

## 2021-08-28 DIAGNOSIS — H18519 Endothelial corneal dystrophy, unspecified eye: Secondary | ICD-10-CM | POA: Insufficient documentation

## 2021-08-28 DIAGNOSIS — I70203 Unspecified atherosclerosis of native arteries of extremities, bilateral legs: Secondary | ICD-10-CM | POA: Insufficient documentation

## 2021-08-28 DIAGNOSIS — I739 Peripheral vascular disease, unspecified: Secondary | ICD-10-CM | POA: Insufficient documentation

## 2021-08-28 DIAGNOSIS — H269 Unspecified cataract: Secondary | ICD-10-CM | POA: Insufficient documentation

## 2021-08-28 DIAGNOSIS — H35371 Puckering of macula, right eye: Secondary | ICD-10-CM | POA: Insufficient documentation

## 2021-08-28 DIAGNOSIS — S75092D Other specified injury of femoral artery, left leg, subsequent encounter: Secondary | ICD-10-CM

## 2021-08-28 NOTE — Progress Notes (Signed)
  POST OPERATIVE OFFICE NOTE    CC:  F/u for surgery  HPI:  This is a 80 y.o. female who is s/p left common femoral endarterectomy with vein patch angioplasty; Mynx device closure right common femoral artery sheath. This was performed by Dr. Donzetta Matters on 08/06/2021. She presents today for incision check. She denies LE pain or drainage from her groin.  She underwent TAVR on 08/06/2021 via left common femoral approach.  At completion she was having decreased sensation in her left foot with absent distal signals and angiography was performed which demonstrated occlusion of the left common femoral artery she was subsequently indicated for the above-noted procedure.  Allergies  Allergen Reactions   Codeine Nausea And Vomiting    Weakness and dysequilibrium   Statins Hives    Myalgias     Current Outpatient Medications  Medication Sig Dispense Refill   ammonium lactate (LAC-HYDRIN) 12 % lotion Apply 1 application topically as needed for dry skin.     amoxicillin (AMOXIL) 500 MG tablet Take 1 tablet (500 mg total) by mouth as directed. Take 4 tablets by mouth 1 hour prior to dental work and cleanings 12 tablet 12   aspirin EC 81 MG tablet Take 81 mg by mouth daily. Swallow whole.     Cholecalciferol (VITAMIN D3) 250 MCG (10000 UT) capsule Take 4,000 Units by mouth daily.     clopidogrel (PLAVIX) 75 MG tablet Take 1 tablet (75 mg total) by mouth daily with breakfast. 90 tablet 0   etanercept (ENBREL) 50 MG/ML injection Inject 50 mg into the skin every Sunday.      ezetimibe (ZETIA) 10 MG tablet Take 1 tablet (10 mg total) by mouth daily. 30 tablet 0   HYDROcodone-acetaminophen (NORCO) 5-325 MG tablet Take 1 tablet by mouth 2 (two) times daily. 60 tablet 0   levothyroxine (SYNTHROID) 88 MCG tablet Take one tablet by mouth once daily 30 minutes before breakfast on an empty stomach for thyroid. 90 tablet 3   simvastatin (ZOCOR) 10 MG tablet TAKE 1 TABLET BY MOUTH EVERYDAY AT BEDTIME 90 tablet 3   No  current facility-administered medications for this visit.     ROS:  See HPI  BP 119/74 (BP Location: Left Arm, Patient Position: Sitting, Cuff Size: Normal)   Pulse 97   Temp 98.6 F (37 C) (Temporal)   Resp 20   Ht 5\' 5"  (1.651 m)   Wt 144 lb (65.3 kg)   SpO2 97%   BMI 23.96 kg/m   Physical Exam:  General appearance: Awake, alert in no apparent distress Cardiac: Heart rate and rhythm are regular Respirations: Nonlabored Incisions: Left groin incision is well approximated without drainage. Extremities: Both feet are warm with intact sensation and motor function.  2+ left femoral pulse  Assessment/Plan:  This is a 80 y.o. female who is s/p: left femoral endarterectomy with VPA. She is recovering well from TAVR. She will remain on Plavix for 30-60 days. Continue aspirin and statin.  Follow-up in one month with LLE arterial duplex.   Risa Grill, PA-C [facility] [phone]  Clinic MD:  Dr. Donzetta Matters

## 2021-08-29 ENCOUNTER — Other Ambulatory Visit: Payer: Self-pay

## 2021-08-29 DIAGNOSIS — S75092D Other specified injury of femoral artery, left leg, subsequent encounter: Secondary | ICD-10-CM

## 2021-09-02 ENCOUNTER — Other Ambulatory Visit: Payer: Self-pay

## 2021-09-02 ENCOUNTER — Ambulatory Visit (INDEPENDENT_AMBULATORY_CARE_PROVIDER_SITE_OTHER): Payer: Medicare Other | Admitting: Nurse Practitioner

## 2021-09-02 ENCOUNTER — Encounter: Payer: Self-pay | Admitting: Nurse Practitioner

## 2021-09-02 VITALS — BP 122/84 | HR 90 | Temp 97.5°F | Ht 65.0 in | Wt 144.0 lb

## 2021-09-02 DIAGNOSIS — L4052 Psoriatic arthritis mutilans: Secondary | ICD-10-CM | POA: Diagnosis not present

## 2021-09-02 DIAGNOSIS — I350 Nonrheumatic aortic (valve) stenosis: Secondary | ICD-10-CM

## 2021-09-02 DIAGNOSIS — G894 Chronic pain syndrome: Secondary | ICD-10-CM | POA: Diagnosis not present

## 2021-09-02 DIAGNOSIS — F39 Unspecified mood [affective] disorder: Secondary | ICD-10-CM

## 2021-09-02 DIAGNOSIS — R42 Dizziness and giddiness: Secondary | ICD-10-CM | POA: Diagnosis not present

## 2021-09-02 DIAGNOSIS — Z952 Presence of prosthetic heart valve: Secondary | ICD-10-CM | POA: Diagnosis not present

## 2021-09-02 DIAGNOSIS — I70202 Unspecified atherosclerosis of native arteries of extremities, left leg: Secondary | ICD-10-CM

## 2021-09-02 NOTE — Progress Notes (Signed)
Careteam: Patient Care Team: [name], NP as PCP - General (Geriatric Medicine) Tomma Rakers, MD as Referring Physician (Ophthalmology)  PLACE OF SERVICE:  [place]  Advanced Directive information    Allergies  Allergen Reactions   Codeine Nausea And Vomiting    Weakness and dysequilibrium   Statins Hives    Myalgias     Chief Complaint  Patient presents with   Medical Management of Chronic Issues    6 month follow-up. Discuss need for td/tdap and covid # 5 or postpone/exclude if patient refuses. Patient going Mid November for [employer].      HPI: Patient is a 80 y.o. female here for routine follow up   She reports that over the past week she has noticed changes in her mood. She has noticed that she is becoming more irritable, has decreased energy and fatigue. She also reports decreased sleep and appetite. Denies feeling down or depressed remains active and has no issues following her daily routine.   Aortic stenosis- s/p TAVR procedure 08/06/21. Patient started on plavix 75 mg daily and continues daily aspirin. Continues to experience dizziness. ECHO 08/07/21 showed EF of 60-65%% with a mean gradient of 12 mmHg and no PVL.  Plans to follow up with cardiology this week Wednesday.   Patient with decreased sensation in left foot after TAVR procedure. Angiography revealed occlusion of left femoral artery and patient underwent femoral endarterectomy with vein patch angioplasty by Dr. Donzetta Matters.  Vascular & vein specialists following.   Chronic pain- Stable continues to take hydrocodone once a day due to arthritis  Has started back on enbrel was off for 4 weeks due to surgical procedure.   Polycythemia vera-Follows up with phlebotomy per hematology   Review of Systems:  Review of Systems  Constitutional:  Positive for malaise/fatigue. Negative for chills, fever and weight loss.  HENT:  Negative for hearing loss.   Respiratory:  Negative for cough and  shortness of breath.   Cardiovascular:  Negative for chest pain, palpitations and leg swelling.  Gastrointestinal:  Negative for blood in stool, constipation and diarrhea.  Genitourinary:  Negative for dysuria.  Musculoskeletal:  Negative for falls.  Neurological:  Positive for dizziness.   Past Medical History:  Diagnosis Date   Complication of anesthesia    Difficult intubation    20-25 years ago   H/O total knee replacement 10/28/1999   Right   High cholesterol    History of tonsillectomy and adenoidectomy 10/27/1944   History of transcatheter aortic valve replacement (TAVR) 08/06/2021   s/p Edwards 75mm S3 vis TF approach with Dr. Burt Knack and Dr. Cyndia Bent   Psoriatic arthritis [facility]) 10/27/1988   Sleep apnea with use of continuous positive airway pressure (CPAP) 10/27/2002   Thyroid disease    Past Surgical History:  Procedure Laterality Date   CATARACT EXTRACTION W/ INTRAOCULAR LENS IMPLANT Bilateral    FOOT SURGERY     fused arch in left foot   GROIN DISSECTION  08/06/2021   Procedure: Virl Son EXPLORATION ;  Surgeon: Waynetta Sandy, MD;  Location: [place];  Service: Vascular;;   INTRAOPERATIVE TRANSTHORACIC ECHOCARDIOGRAM N/A 08/06/2021   Procedure: INTRAOPERATIVE TRANSTHORACIC ECHOCARDIOGRAM;  Surgeon: Early Osmond, MD;  Location: [place];  Service: Cardiovascular;  Laterality: N/A;   LOWER EXTREMITY ANGIOGRAM  08/06/2021   Procedure: LEFT LOWER EXTREMITY ANGIOGRAM;  Surgeon: Early Osmond, MD;  Location: [place] ;  Service: Cardiovascular;;   PATCH ANGIOPLASTY  08/06/2021   Procedure: [facility]  ANGIOPLASTY OF FEMORAL ARTERY USING LEFT GREATER SAPHENOUS VEIN;  Surgeon: Waynetta Sandy, MD;  Location: [place];  Service: Vascular;;   RIGHT/LEFT HEART CATH AND CORONARY ANGIOGRAPHY N/A 04/16/2021   Procedure: RIGHT/LEFT HEART CATH AND CORONARY ANGIOGRAPHY;  Surgeon: Adrian Prows, MD;  Location: [place] [facility];  Service: Cardiovascular;  Laterality: N/A;   SPINE  SURGERY     TONSILLECTOMY     TOTAL KNEE ARTHROPLASTY Left    TRANSCATHETER AORTIC VALVE REPLACEMENT, TRANSFEMORAL N/A 08/06/2021   Procedure: TRANSCATHETER AORTIC VALVE REPLACEMENT, TRANSFEMORAL;  Surgeon: Early Osmond, MD;  Location: [place] [facility];  Service: Cardiovascular;  Laterality: N/A;   Social History:   reports that she quit smoking about 47 years ago. Her smoking use included cigarettes. She has a 36.00 pack-year smoking history. She has never used smokeless tobacco. She reports current alcohol use of about 1.0 - 2.0 standard drink per week. She reports that she does not use drugs.  Family History  Problem Relation Age of Onset   Heart attack Father    Heart disease Sister     Medications: Patient's Medications  New Prescriptions   No medications on file  Previous Medications   AMMONIUM LACTATE (LAC-HYDRIN) 12 % LOTION    Apply 1 application topically as needed for dry skin.   AMOXICILLIN (AMOXIL) 500 MG TABLET    Take 1 tablet (500 mg total) by mouth as directed. Take 4 tablets by mouth 1 hour prior to dental work and cleanings   ASPIRIN EC 81 MG TABLET    Take 81 mg by mouth daily. Swallow whole.   CHOLECALCIFEROL (VITAMIN D3) 250 MCG (10000 UT) CAPSULE    Take 4,000 Units by mouth daily.   CLOPIDOGREL (PLAVIX) 75 MG TABLET    Take 1 tablet (75 mg total) by mouth daily with breakfast.   ETANERCEPT (ENBREL) 50 MG/ML INJECTION    Inject 50 mg into the skin every Sunday.    EZETIMIBE (ZETIA) 10 MG TABLET    Take 1 tablet (10 mg total) by mouth daily.   HYDROCODONE-ACETAMINOPHEN (NORCO) 5-325 MG TABLET    Take 1 tablet by mouth 2 (two) times daily.   LEVOTHYROXINE (SYNTHROID) 88 MCG TABLET    Take one tablet by mouth once daily 30 minutes before breakfast on an empty stomach for thyroid.   SIMVASTATIN (ZOCOR) 10 MG TABLET    TAKE 1 TABLET BY MOUTH EVERYDAY AT BEDTIME  Modified Medications   No medications on file  Discontinued Medications   No medications on file    Physical  Exam:  Vitals:   09/02/21 1253  BP: 122/84  Pulse: 90  Temp: (!) 97.5 F (36.4 C)  TempSrc: Skin  SpO2: 99%  Weight: 144 lb (65.3 kg)  Height: 5\' 5"  (1.651 m)   Body mass index is 23.96 kg/m. Wt Readings from Last 3 Encounters:  09/02/21 144 lb (65.3 kg)  08/28/21 144 lb (65.3 kg)  08/14/21 146 lb (66.2 kg)    Physical Exam Constitutional:      Appearance: Normal appearance.  HENT:     Head: Normocephalic.  Cardiovascular:     Rate and Rhythm: Normal rate.     Heart sounds: Murmur heard.  Pulmonary:     Effort: Pulmonary effort is normal.     Breath sounds: Normal breath sounds.  Abdominal:     Hernia: A hernia is present.  Musculoskeletal:     Cervical back: Normal range of motion and neck supple.  Right lower leg: No edema.     Left lower leg: No edema.  Skin:    General: Skin is warm and dry.  Neurological:     Mental Status: She is alert and oriented to person, place, and time.    Labs reviewed: Basic Metabolic Panel: Recent Labs    01/21/21 1630 01/29/21 1125 03/06/21 0856 03/14/21 1141 06/11/21 1035 08/02/21 1441 08/06/21 1159 08/07/21 0143  NA 141   < > 141   < > 142 138 142 137  K 4.4   < > 4.5   < > 4.7 4.6 4.1 4.3  CL 108   < > 107   < > 104 109 108 109  CO2 22   < > 24   < > 23 21*  --  20*  GLUCOSE 90   < > 90   < > 98 97 119* 139*  BUN 16   < > 15   < > 15 14 12 10   CREATININE 0.98*   < > 1.21*   < > 0.91 0.88 0.80 0.85  CALCIUM 9.8   < > 9.5   < > 9.7 9.3  --  8.6*  TSH 0.58  --  1.11  --   --   --   --   --    < > = values in this interval not displayed.   Liver Function Tests: Recent Labs    01/29/21 1125 03/06/21 0856 03/14/21 1141 08/02/21 1441  AST 24 17 21 29   ALT 28 14 13 23   ALKPHOS 71  --  68 59  BILITOT 0.9 0.5 0.5 0.9  PROT 8.3* 7.4 7.9 7.8  ALBUMIN 4.2  --  3.9 4.0   No results for input(s): LIPASE, AMYLASE in the last 8760 hours. No results for input(s): AMMONIA in the last 8760 hours. CBC: Recent Labs     03/06/21 0856 03/14/21 1141 04/12/21 1002 05/16/21 1124 08/02/21 1441 08/06/21 1159 08/07/21 0143  WBC 6.5 6.1   < > 5.7 7.3  --  8.3  NEUTROABS 3,835 3.5  --  3.4  --   --   --   HGB 13.4 13.8   < > 14.3 14.6 13.6 12.1  HCT 43.3 42.8   < > 45.2 46.6* 40.0 39.5  MCV 83.9 82.9   < > 82.9 82.9  --  83.3  PLT 319 252   < > 229 254  --  188   < > = values in this interval not displayed.   Lipid Panel: Recent Labs    03/06/21 0856 06/06/21 1012  CHOL 193 155  HDL 55 48  LDLCALC 114* 87  TRIG 129 107  CHOLHDL 3.5  --    TSH: Recent Labs    01/21/21 1630 03/06/21 0856  TSH 0.58 1.11   A1C: No results found for: HGBA1C   Assessment/Plan 1. S/P TAVR (transcatheter aortic valve replacement) Doing well. No shortness of breath or chest pains. Continue plavix and aspirin. Has amoxicillin rx. for dental procedures. Cardiology following.   2. Nonrheumatic aortic valve stenosis S/p TAVR, doing well. ECHO 08/07/21 showed EF of 60-65% with a mean gradient of 12 mmHg and no PVL. Plans to follow up with cardiology this week Wednesday.   3. Dizziness Ongoing, reports that symptoms continue after TAVR procedure. Cardiology following.   4. Psoriatic arthritis, 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HCC) Stable, followed by rheumatologist, Continue Enbrel and hydrocodone for pain PRN  5. Chronic pain syndrome Stable. Continue Norco  for pain   6. Mood disorder -denies depression or anxiety. Increase in irritability recently but still able to maintain her day to day. There is more stressors at home which likely contributing. Encouraged to increase physical activity, mediatation, and positive self talk. To monitor for depression and notify if mood worsens.    Next appt: 6 months.   I personally was present during the history, physical exam and medical decision-making activities of this service and have verified that the service and findings are accurately documented in the student's note  Caine Barfield  K. [place], [facility] [phone]

## 2021-09-03 NOTE — Progress Notes (Signed)
HEART AND [place]                                     Cardiology Office Note:    Date:  09/05/2021   ID:  Sydney Reid, DOB [birth year], MRN [id number]  PCP:  [name], NP  [facility] Cardiologist: Dr. Einar Gip / Dr. Burt Knack & Dr. Cyndia Bent (TAVR) [facility] Electrophysiologist:  None   Referring MD: [name], NP   1 month s/p TAVR  History of Present Illness:    Sydney Reid is a 80 y.o. female with a hx of psoriatic arthritis, OSA on CPAP, HLD, CAD, polycythemia vera and aortic stenosis s/p TAVR (08/06/21) who presents to clinic for follow up.   She has remained relatively active considering her arthritis but reports progressive exertional shortness of breath and fatigue. She has been followed by Dr. Einar Gip for aortic stenosis. Her most recent echo on 02/19/2021 showed EF 60-65% with severe AS with a mean gradient of 41 mmHg with a valve area of 0.72 cm by VTI. Cardiac catheterization showed moderate stenosis of the left circumflex with 60 to 70% proximal stenosis and an 80% calcific stenosis of a third marginal branch.  The LAD had mild nonobstructive disease.  The RCA had mild calcific plaque but no stenosis.  The mean gradient at cath was 29 mmHg with a peak to peak gradient of 33 mmHg.  Aortic valve area was calculated at 0.88 cm.  Right heart pressures and cardiac index were normal.  She was evaluated by the multidisciplinary valve team and underwent successful TAVR with a 23 mm Edwards Sapien 3 Ultra THV via the TF approach on 08/06/21. At completion of case she was having decreased sensation in her left foot with absent distal signals and angiography was performed which demonstrated occlusion of the left common femoral artery. She underwent successful left common femoral endarterectomy with vein patch angioplasty by Dr. Donzetta Matters. Post operative echo showed EF 60%, normally functioning TAVR with a mean gradient  of 12 mmHg and no PVL. She was continued on Asprin with the addition of Plavix 75 mg daily x 3 months.  Today the patient presents to clinic for follow up. She is here with her husband. No CP or SOB. No LE edema, orthopnea or PND. No dizziness or syncope. No blood in stool or urine. No palpitations. She has felt angry this past week which is unlike her. Talked with PCP about this.    Past Medical History:  Diagnosis Date   Complication of anesthesia    Difficult intubation    20-25 years ago   H/O total knee replacement 10/28/1999   Right   High cholesterol    History of tonsillectomy and adenoidectomy 10/27/1944   History of transcatheter aortic valve replacement (TAVR) 08/06/2021   s/p Edwards 71mm S3 vis TF approach with Dr. Burt Knack and Dr. Cyndia Bent   Psoriatic arthritis [facility]) 10/27/1988   Sleep apnea with use of continuous positive airway pressure (CPAP) 10/27/2002   Thyroid disease     Past Surgical History:  Procedure Laterality Date   CATARACT EXTRACTION W/ INTRAOCULAR LENS IMPLANT Bilateral    FOOT SURGERY     fused arch in left foot   GROIN DISSECTION  08/06/2021   Procedure: Virl Son EXPLORATION ;  Surgeon: Waynetta Sandy, MD;  Location: [place];  Service: Vascular;;  INTRAOPERATIVE TRANSTHORACIC ECHOCARDIOGRAM N/A 08/06/2021   Procedure: INTRAOPERATIVE TRANSTHORACIC ECHOCARDIOGRAM;  Surgeon: Early Osmond, MD;  Location: [place];  Service: Cardiovascular;  Laterality: N/A;   LOWER EXTREMITY ANGIOGRAM  08/06/2021   Procedure: LEFT LOWER EXTREMITY ANGIOGRAM;  Surgeon: Early Osmond, MD;  Location: [place];  Service: Cardiovascular;;   PATCH ANGIOPLASTY  08/06/2021   Procedure: PATCH ANGIOPLASTY OF FEMORAL ARTERY USING LEFT GREATER SAPHENOUS VEIN;  Surgeon: Waynetta Sandy, MD;  Location: [place];  Service: Vascular;;   RIGHT/LEFT HEART CATH AND CORONARY ANGIOGRAPHY N/A 04/16/2021   Procedure: RIGHT/LEFT HEART CATH AND CORONARY ANGIOGRAPHY;  Surgeon: Adrian Prows, MD;  Location: [place] [facility];  Service: Cardiovascular;  Laterality: N/A;   SPINE SURGERY     TONSILLECTOMY     TOTAL KNEE ARTHROPLASTY Left    TRANSCATHETER AORTIC VALVE REPLACEMENT, TRANSFEMORAL N/A 08/06/2021   Procedure: TRANSCATHETER AORTIC VALVE REPLACEMENT, TRANSFEMORAL;  Surgeon: Early Osmond, MD;  Location: [facility];  Service: Cardiovascular;  Laterality: N/A;    Current Medications: Current Meds  Medication Sig   ammonium lactate (LAC-HYDRIN) 12 % lotion Apply 1 application topically as needed for dry skin.   amoxicillin (AMOXIL) 500 MG tablet Take 1 tablet (500 mg total) by mouth as directed. Take 4 tablets by mouth 1 hour prior to dental work and cleanings   aspirin EC 81 MG tablet Take 81 mg by mouth daily. Swallow whole.   Cholecalciferol (VITAMIN D3) 250 MCG (10000 UT) capsule Take 4,000 Units by mouth daily.   clopidogrel (PLAVIX) 75 MG tablet Take 75 mg by mouth daily. Pt will stop medication after she finishes current bottle   etanercept (ENBREL) 50 MG/ML injection Inject 50 mg into the skin every Sunday.    ezetimibe (ZETIA) 10 MG tablet Take 1 tablet (10 mg total) by mouth daily.   HYDROcodone-acetaminophen (NORCO) 5-325 MG tablet Take 1 tablet by mouth 2 (two) times daily. (Patient taking differently: Take 1 tablet by mouth at bedtime.)   levothyroxine (SYNTHROID) 88 MCG tablet Take one tablet by mouth once daily 30 minutes before breakfast on an empty stomach for thyroid.   simvastatin (ZOCOR) 10 MG tablet TAKE 1 TABLET BY MOUTH EVERYDAY AT BEDTIME   [DISCONTINUED] clopidogrel (PLAVIX) 75 MG tablet Take 1 tablet (75 mg total) by mouth daily with breakfast.     Allergies:   Codeine and Statins   Social History   Socioeconomic History   Marital status: Married    Spouse name: Not on file   Number of children: Not on file   Years of education: Not on file   Highest education level: Not on file  Occupational History   Not on file  Tobacco Use    Smoking status: Former    Packs/day: 2.00    Years: 18.00    Pack years: 36.00    Types: Cigarettes    Quit date: 28    Years since quitting: 47.8   Smokeless tobacco: Never  Vaping Use   Vaping Use: Never used  Substance and Sexual Activity   Alcohol use: Yes    Alcohol/week: 1.0 - 2.0 standard drink    Types: 1 - 2 Standard drinks or equivalent per week    Comment: Social drinker, couple times a week   Drug use: No   Sexual activity: Not on file  Other Topics Concern   Not on file  Social History Narrative   Diet? Normal      Do you drink/eat things  with caffeine? Yes, 1 mt. Dew per day      Marital status?              m                      What year were you married? 1988      Do you live in a house, apartment, assisted living, condo, trailer, etc.? Townhouse      Is it one or more stories? 2 story      How many persons live in your home? 2      Do you have any pets in your home? (please list) no      Current or past profession: Is manager      Do you exercise?     No (did it for 50 years)                       Type & how often?      Do you have a living will? yes      Do you have a DNR form?        yes                          If not, do you want to discuss one?      Do you have signed POA/HPOA for forms?       Social Determinants of Health   Financial Resource Strain: Not on file  Food Insecurity: Not on file  Transportation Needs: Not on file  Physical Activity: Not on file  Stress: Not on file  Social Connections: Not on file     Family History: The patient's family history includes Heart attack in her father; Heart disease in her sister.  ROS:   Please see the history of present illness.    All other systems reviewed and are negative.  EKGs/Labs/Other Studies Reviewed:    The following studies were reviewed today:  [facility]  TAVR OPERATIVE NOTE     Date of Procedure:                08/06/2021   Preoperative  Diagnosis:      Severe Aortic Stenosis    Postoperative Diagnosis:    Same    Procedure:        Transcatheter Aortic Valve Replacement - Transfemoral Approach Edwards Sapien 3 THV (size  44mm, model # [id number], serial # [id number]); complicated by left femoral occlusion requiring operative repair              Co-Surgeons:  Lenna Sciara, MD and Gaye Pollack, MD; proctoring Sherren Mocha, MD   Anesthesiologist:  Oleta Mouse, MD   Echocardiographer:   Trellis Moment, MD   Pre-operative Echo Findings: Severe aortic stenosis Normal left ventricular systolic function   Post-operative Echo Findings: No paravalvular leak Unchanged left ventricular systolic function _____________     Echo 08/07/21:  IMPRESSIONS   1. The aortic valve has been replaced with a 23 mm Edward Sapien Valve.  Aortic valve regurgitation is not visualized. Aortic valve mean gradient  measures 12.0 mmHg. Aortic valve acceleration time measures 60 msec. DVI  of 0.54.   2. Post procedure peak gradient 26 mm Hg, mean gradient 12 mm Hg, DVI  0.54, EOA 1.7 cm2.   3. Left ventricular ejection fraction, by estimation, is 60 to 65%. The  left ventricle  has normal function. The left ventricle has no regional  wall motion abnormalities. There is mild concentric left ventricular  hypertrophy. Left ventricular diastolic  parameters are consistent with Grade I diastolic dysfunction (impaired  relaxation).   4. Right ventricular systolic function is normal. The right ventricular  size is normal. There is normal pulmonary artery systolic pressure.   5. The mitral valve is normal in structure. No evidence of mitral valve  regurgitation.   6. The inferior vena cava is normal in size with greater than 50%  respiratory variability, suggesting right atrial pressure of 3 mmHg.   Comparison(s): A prior study was performed on 08/06/2021. Stable  prosthetic valve findings.   _______________________  Echo  09/04/21 IMPRESSIONS  1. Left ventricular ejection fraction, by estimation, is 60 to 65%. The left ventricle has normal function. The left ventricle has no regional wall motion abnormalities. Left ventricular diastolic parameters are indeterminate.  2. Right ventricular systolic function is normal. The right ventricular size is normal.  3. The mitral valve is normal in structure. No evidence of mitral valve regurgitation. No evidence of mitral stenosis.  4. The aortic valve has been repaired/replaced. Aortic valve regurgitation is not visualized. No aortic stenosis is present. There is a 23 mm Sapien prosthetic (TAVR) valve present in the aortic position. Echo findings are consistent with normal  structure and function of the aortic valve prosthesis. Aortic valve mean gradient measures 8.2 mmHg. Aortic valve Vmax measures 1.98 m/s.  5. The inferior vena cava is normal in size with greater than 50% respiratory variability, suggesting right atrial pressure of 3 mmHg.   Comparison(s): TAVR is new.    EKG:  EKG is NOT ordered today.   Recent Labs: 03/06/2021: TSH 1.11 08/02/2021: ALT 23 08/07/2021: BUN 10; Creatinine, Ser 0.85; Hemoglobin 12.1; Platelets 188; Potassium 4.3; Sodium 137  Recent Lipid Panel    Component Value Date/Time   CHOL 155 06/06/2021 1012   TRIG 107 06/06/2021 1012   HDL 48 06/06/2021 1012   CHOLHDL 3.5 03/06/2021 0856   VLDL 23 04/09/2017 0844   LDLCALC 87 06/06/2021 1012   LDLCALC 114 (H) 03/06/2021 0856     Risk Assessment/Calculations:       Physical Exam:    VS:  BP 124/68   Pulse 74   Ht 5\' 5"  (1.651 m)   Wt 141 lb (64 kg)   SpO2 98%   BMI 23.46 kg/m     Wt Readings from Last 3 Encounters:  09/04/21 141 lb (64 kg)  09/02/21 144 lb (65.3 kg)  08/28/21 144 lb (65.3 kg)     GEN: \ Well nourished, well developed in no acute distress HEENT: Normal NECK: No JVD LYMPHATICS: No lymphadenopathy CARDIAC: RRR, very soft flow murmur. No rubs,  gallops RESPIRATORY:  Clear to auscultation without rales, wheezing or rhonchi  ABDOMEN: Soft, non-tender, non-distended MUSCULOSKELETAL: bilateral hand deformities  SKIN: Warm and dry.   NEUROLOGIC:  Alert and oriented x 3 PSYCHIATRIC:  Normal affect   ASSESSMENT:    1. S/P TAVR (transcatheter aortic valve replacement)   2. Femoral artery occlusion, left (HCC)   3. Psoriatic arthritis, 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HCC)   4. Esophageal thickening     PLAN:    In order of problems listed above:  Severe AS s/p TAVR: echo today showed EF 60%, normally functioning TAVR with a mean gradient of 8 mmHg and no PVL. She has NYHA class I symptoms. SBE prophylaxis discussed; she has amoxicillin. Continue on Aspirin and  Plavix. Plavix can be discontinued after 3 months of therapy. I will see her back in 1 year with an echo.   S/p left femoral endarterectomy with vein patch angioplasty: followed by vascular.    Psoriatic arthritis: resume home Enbrel  Esophageal thickening: pre TAVR CT showed "moderate to large hiatal hernia with severe thickening of the middle to distal thirds of the esophagus without discrete esophageal mass. This may simply reflect chronic reflux esophagitis, however, further evaluation with endoscopy should be considered if there is any clinical concern for esophageal metaplasia or neoplasia." This was discussed at last visit and she was clear that she is not interested in a GI eval.   Cardiac Rehabilitation Eligibility Assessment: Patient is cleared to start cardiac rehab  Medication Adjustments/Labs and Tests Ordered: Current medicines are reviewed at length with the patient today.  Concerns regarding medicines are outlined above.  Orders Placed This Encounter  Procedures   ECHOCARDIOGRAM COMPLETE     No orders of the defined types were placed in this encounter.    Patient Instructions  Medication Instructions:  Stop Plavix after you finish up your current bottle   *If  you need a refill on your cardiac medications before your next appointment, please call your pharmacy*   Lab Work: None ordered   If you have labs (blood work) drawn today and your tests are completely normal, you will receive your results only by: [place] (if you have MyChart) OR A paper copy in the mail If you have any lab test that is abnormal or we need to change your treatment, we will call you to review the results.   Testing/Procedures: Your physician has requested that you have an echocardiogram in 1 year on the same day you come in for your office visit to see Katie. Echocardiography is a painless test that uses sound waves to create images of your heart. It provides your doctor with information about the size and shape of your heart and how well your heart's chambers and valves are working. This procedure takes approximately one hour. There are no restrictions for this procedure.    Follow-Up: Follow up with Angelena Form, PA-C on 09/03/22 @ 2:00 PM  Follow up with Dr. Einar Gip in 6 months   Other Instructions None     Signed, Angelena Form, PA-C  09/05/2021 8:41 AM    [place]

## 2021-09-04 ENCOUNTER — Other Ambulatory Visit: Payer: Self-pay

## 2021-09-04 ENCOUNTER — Encounter: Payer: Self-pay | Admitting: Physician Assistant

## 2021-09-04 ENCOUNTER — Ambulatory Visit (INDEPENDENT_AMBULATORY_CARE_PROVIDER_SITE_OTHER): Payer: Medicare Other | Admitting: Physician Assistant

## 2021-09-04 ENCOUNTER — Ambulatory Visit (HOSPITAL_COMMUNITY): Payer: Medicare Other | Attending: Cardiology

## 2021-09-04 VITALS — BP 124/68 | HR 74 | Ht 65.0 in | Wt 141.0 lb

## 2021-09-04 DIAGNOSIS — K2289 Other specified disease of esophagus: Secondary | ICD-10-CM | POA: Diagnosis not present

## 2021-09-04 DIAGNOSIS — L4052 Psoriatic arthritis mutilans: Secondary | ICD-10-CM

## 2021-09-04 DIAGNOSIS — I70202 Unspecified atherosclerosis of native arteries of extremities, left leg: Secondary | ICD-10-CM

## 2021-09-04 DIAGNOSIS — Z952 Presence of prosthetic heart valve: Secondary | ICD-10-CM | POA: Diagnosis not present

## 2021-09-04 LAB — ECHOCARDIOGRAM COMPLETE
AR max vel: 1.36 cm2
AV Area VTI: 0.97 cm2
AV Area mean vel: 1.15 cm2
AV Mean grad: 8.2 mmHg
AV Peak grad: 15.6 mmHg
Ao pk vel: 1.98 m/s
Area-P 1/2: 2.76 cm2
S' Lateral: 2.6 cm

## 2021-09-04 NOTE — Patient Instructions (Addendum)
Medication Instructions:  Stop Plavix after you finish up your current bottle   *If you need a refill on your cardiac medications before your next appointment, please call your pharmacy*   Lab Work: None ordered   If you have labs (blood work) drawn today and your tests are completely normal, you will receive your results only by: [place] (if you have MyChart) OR A paper copy in the mail If you have any lab test that is abnormal or we need to change your treatment, we will call you to review the results.   Testing/Procedures: Your physician has requested that you have an echocardiogram in 1 year on the same day you come in for your office visit to see Katie. Echocardiography is a painless test that uses sound waves to create images of your heart. It provides your doctor with information about the size and shape of your heart and how well your heart's chambers and valves are working. This procedure takes approximately one hour. There are no restrictions for this procedure.    Follow-Up: Follow up with Angelena Form, PA-C on 09/03/22 @ 2:00 PM  Follow up with Dr. Einar Gip in 6 months   Other Instructions None

## 2021-09-05 ENCOUNTER — Other Ambulatory Visit (HOSPITAL_COMMUNITY): Payer: Medicare Other

## 2021-09-05 ENCOUNTER — Ambulatory Visit: Payer: Medicare Other | Admitting: Physician Assistant

## 2021-09-11 ENCOUNTER — Telehealth: Payer: Self-pay

## 2021-09-11 NOTE — Telephone Encounter (Signed)
A nurse with cardiac rehab called regarding an appointment the patient had with [place] 08/09/2021. She canceled and in order for her to be seen by cardiac rehab she has to follow up with [place] first. They want to know if the pt should still proceed with these plans and reschedule her appointment with [place]. Pt had a TAVER done 08/06/2021. Please advise.   [place]

## 2021-09-11 NOTE — Telephone Encounter (Signed)
Yes postpone her visit with me to later date

## 2021-09-12 NOTE — Telephone Encounter (Signed)
Please call and have pt schedule an appointment with [place].

## 2021-09-30 ENCOUNTER — Other Ambulatory Visit: Payer: Self-pay | Admitting: *Deleted

## 2021-09-30 DIAGNOSIS — L4052 Psoriatic arthritis mutilans: Secondary | ICD-10-CM

## 2021-09-30 DIAGNOSIS — G894 Chronic pain syndrome: Secondary | ICD-10-CM

## 2021-09-30 MED ORDER — HYDROCODONE-ACETAMINOPHEN 5-325 MG PO TABS: 1.0000 | ORAL_TABLET | Freq: Two times a day (BID) | ORAL | 0 refills | Status: DC

## 2021-09-30 NOTE — Telephone Encounter (Signed)
Patient requested refill.  Epic LR: 08/08/2021 Contract date: 02/18/2021 Pended Rx and sent to [facility] for approval.

## 2021-10-02 DIAGNOSIS — Z23 Encounter for immunization: Secondary | ICD-10-CM | POA: Diagnosis not present

## 2021-10-03 ENCOUNTER — Other Ambulatory Visit: Payer: Self-pay

## 2021-10-03 ENCOUNTER — Ambulatory Visit (HOSPITAL_COMMUNITY)
Admission: RE | Admit: 2021-10-03 | Discharge: 2021-10-03 | Disposition: A | Discharge status: Home / Self Care | Payer: Medicare Other | Source: Ambulatory Visit | Attending: Physician Assistant | Admitting: Physician Assistant

## 2021-10-03 ENCOUNTER — Ambulatory Visit (INDEPENDENT_AMBULATORY_CARE_PROVIDER_SITE_OTHER): Payer: Medicare Other | Admitting: Physician Assistant

## 2021-10-03 ENCOUNTER — Ambulatory Visit (INDEPENDENT_AMBULATORY_CARE_PROVIDER_SITE_OTHER)
Admission: RE | Admit: 2021-10-03 | Discharge: 2021-10-03 | Disposition: A | Discharge status: Home / Self Care | Payer: Medicare Other | Source: Ambulatory Visit | Attending: Physician Assistant | Admitting: Physician Assistant

## 2021-10-03 VITALS — BP 106/69 | HR 92 | Temp 98.2°F | Resp 20 | Ht 65.0 in | Wt 143.4 lb

## 2021-10-03 DIAGNOSIS — S75092D Other specified injury of femoral artery, left leg, subsequent encounter: Secondary | ICD-10-CM

## 2021-10-03 NOTE — Progress Notes (Signed)
Office Note     CC:  follow up Requesting Provider:  [name], NP  HPI: Sydney Reid is a 80 y.o. (July 13, 1941) female who underwent TAVR in October of this year.  Intraoperatively she was noted to have lost her Doppler signals in her left foot.  Dr. Donzetta Matters was consulted intraoperatively and she underwent left common femoral endarterectomy with vein patch angioplasty due to injury to the common femoral artery.  Patient denies any claudication, rest pain, or nonhealing wounds of bilateral lower extremities.  She states her left groin incision is well-healed.  She is taking aspirin and Plavix daily.   Past Medical History:  Diagnosis Date   Complication of anesthesia    Difficult intubation    20-25 years ago   H/O total knee replacement 10/28/1999   Right   High cholesterol    History of tonsillectomy and adenoidectomy 10/27/1944   History of transcatheter aortic valve replacement (TAVR) 08/06/2021   s/p Edwards 21mm S3 vis TF approach with Dr. Burt Knack and Dr. Cyndia Bent   Psoriatic arthritis [facility]) 10/27/1988   Sleep apnea with use of continuous positive airway pressure (CPAP) 10/27/2002   Thyroid disease     Past Surgical History:  Procedure Laterality Date   CATARACT EXTRACTION W/ INTRAOCULAR LENS IMPLANT Bilateral    FOOT SURGERY     fused arch in left foot   GROIN DISSECTION  08/06/2021   Procedure: Virl Son EXPLORATION ;  Surgeon: Waynetta Sandy, MD;  Location: [place];  Service: Vascular;;   INTRAOPERATIVE TRANSTHORACIC ECHOCARDIOGRAM N/A 08/06/2021   Procedure: INTRAOPERATIVE TRANSTHORACIC ECHOCARDIOGRAM;  Surgeon: Early Osmond, MD;  Location: [place];  Service: Cardiovascular;  Laterality: N/A;   LOWER EXTREMITY ANGIOGRAM  08/06/2021   Procedure: LEFT LOWER EXTREMITY ANGIOGRAM;  Surgeon: Early Osmond, MD;  Location: [place];  Service: Cardiovascular;;   PATCH ANGIOPLASTY  08/06/2021   Procedure: PATCH ANGIOPLASTY OF FEMORAL ARTERY USING LEFT GREATER  SAPHENOUS VEIN;  Surgeon: Waynetta Sandy, MD;  Location: [place];  Service: Vascular;;   RIGHT/LEFT HEART CATH AND CORONARY ANGIOGRAPHY N/A 04/16/2021   Procedure: RIGHT/LEFT HEART CATH AND CORONARY ANGIOGRAPHY;  Surgeon: Adrian Prows, MD;  Location: [place] [facility];  Service: Cardiovascular;  Laterality: N/A;   SPINE SURGERY     TONSILLECTOMY     TOTAL KNEE ARTHROPLASTY Left    TRANSCATHETER AORTIC VALVE REPLACEMENT, TRANSFEMORAL N/A 08/06/2021   Procedure: TRANSCATHETER AORTIC VALVE REPLACEMENT, TRANSFEMORAL;  Surgeon: Early Osmond, MD;  Location: [facility];  Service: Cardiovascular;  Laterality: N/A;    Social History   Socioeconomic History   Marital status: Married    Spouse name: Not on file   Number of children: Not on file   Years of education: Not on file   Highest education level: Not on file  Occupational History   Not on file  Tobacco Use   Smoking status: Former    Packs/day: 2.00    Years: 18.00    Pack years: 36.00    Types: Cigarettes    Quit date: 40    Years since quitting: 47.9   Smokeless tobacco: Never  Vaping Use   Vaping Use: Never used  Substance and Sexual Activity   Alcohol use: Yes    Alcohol/week: 1.0 - 2.0 standard drink    Types: 1 - 2 Standard drinks or equivalent per week    Comment: Social drinker, couple times a week   Drug use: No   Sexual activity: Not on  file  Other Topics Concern   Not on file  Social History Narrative   Diet? Normal      Do you drink/eat things with caffeine? Yes, 1 mt. Dew per day      Marital status?              m                      What year were you married? 1988      Do you live in a house, apartment, assisted living, condo, trailer, etc.? Townhouse      Is it one or more stories? 2 story      How many persons live in your home? 2      Do you have any pets in your home? (please list) no      Current or past profession: Is manager      Do you exercise?     No (did it for 50 years)                        Type & how often?      Do you have a living will? yes      Do you have a DNR form?        yes                          If not, do you want to discuss one?      Do you have signed POA/HPOA for forms?       Social Determinants of Health   Financial Resource Strain: Not on file  Food Insecurity: Not on file  Transportation Needs: Not on file  Physical Activity: Not on file  Stress: Not on file  Social Connections: Not on file  Intimate Partner Violence: Not on file    Family History  Problem Relation Age of Onset   Heart attack Father    Heart disease Sister     Current Outpatient Medications  Medication Sig Dispense Refill   ammonium lactate (LAC-HYDRIN) 12 % lotion Apply 1 application topically as needed for dry skin.     amoxicillin (AMOXIL) 500 MG tablet Take 1 tablet (500 mg total) by mouth as directed. Take 4 tablets by mouth 1 hour prior to dental work and cleanings 12 tablet 12   aspirin EC 81 MG tablet Take 81 mg by mouth daily. Swallow whole.     Cholecalciferol (VITAMIN D3) 250 MCG (10000 UT) capsule Take 4,000 Units by mouth daily.     clopidogrel (PLAVIX) 75 MG tablet Take 75 mg by mouth daily. Pt will stop medication after she finishes current bottle     etanercept (ENBREL) 50 MG/ML injection Inject 50 mg into the skin every Sunday.      ezetimibe (ZETIA) 10 MG tablet Take 1 tablet (10 mg total) by mouth daily. 30 tablet 0   HYDROcodone-acetaminophen (NORCO) 5-325 MG tablet Take 1 tablet by mouth 2 (two) times daily. 60 tablet 0   levothyroxine (SYNTHROID) 88 MCG tablet Take one tablet by mouth once daily 30 minutes before breakfast on an empty stomach for thyroid. 90 tablet 3   simvastatin (ZOCOR) 10 MG tablet TAKE 1 TABLET BY MOUTH EVERYDAY AT BEDTIME 90 tablet 3   No current facility-administered medications for this visit.    Allergies  Allergen Reactions   Codeine Nausea And Vomiting  Weakness and dysequilibrium   Statins Hives     Myalgias      REVIEW OF SYSTEMS:   [X]  denotes positive finding, [ ]  denotes negative finding Cardiac  Comments:  Chest pain or chest pressure:    Shortness of breath upon exertion:    Short of breath when lying flat:    Irregular heart rhythm:        Vascular    Pain in calf, thigh, or hip brought on by ambulation:    Pain in feet at night that wakes you up from your sleep:     Blood clot in your veins:    Leg swelling:         Pulmonary    Oxygen at home:    Productive cough:     Wheezing:         Neurologic    Sudden weakness in arms or legs:     Sudden numbness in arms or legs:     Sudden onset of difficulty speaking or slurred speech:    Temporary loss of vision in one eye:     Problems with dizziness:         Gastrointestinal    Blood in stool:     Vomited blood:         Genitourinary    Burning when urinating:     Blood in urine:        Psychiatric    Major depression:         Hematologic    Bleeding problems:    Problems with blood clotting too easily:        Skin    Rashes or ulcers:        Constitutional    Fever or chills:      PHYSICAL EXAMINATION:  Vitals:   10/03/21 1407  BP: 106/69  Pulse: 92  Resp: 20  Temp: 98.2 F (36.8 C)  TempSrc: Temporal  SpO2: 97%  Weight: 143 lb 6.4 oz (65 kg)  Height: 5\' 5"  (1.651 m)    General:  WDWN in NAD; vital signs documented above Gait: Not observed HENT: WNL, normocephalic Pulmonary: normal non-labored breathing Cardiac: regular HR Abdomen: soft, NT, no masses Skin: without rashes Vascular Exam/Pulses: Brisk DP doppler signals bilaterally Extremities: without BLE wounds  Neurologic: A&O X 3   Non-Invasive Vascular Imaging:    Arterial duplex demonstrates SFA disease with monophasic flow distally including diffusely diseased popliteal artery and no flow observed in the posterior tibial artery of the left lower extremity   ABI/TBIToday's ABIToday's TBI     Previous ABIPrevious  TBI  +-------+-----------+----------------+------------+------------+  Right  0.67       0.41                                      +-------+-----------+----------------+------------+------------+  Left   0.61       unable to obtain                          +-------+-----------+----------------+------------+------------+    ASSESSMENT/PLAN:: 80 y.o. 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  No indication for further work-up or revascularization. -I offered the patient a repeat ABI study in 1 year.  I also think it would be reasonable to follow-up on an as-needed basis.  Patient elected to follow-up as needed and call us with any questions or concerns. -I encouraged the patient to continue 81 mg aspirin and statin daily.  Okay to discontinue Plavix from a vascular surgery standpoint.   Dagoberto Ligas, PA-C [facility] [phone]  Clinic MD:   Scot Dock

## 2021-10-18 NOTE — Progress Notes (Deleted)
[facility] OFFICE PROGRESS NOTE  [name], NP [address] [place] [address]  DIAGNOSIS: Polycythemia vera with positive JAK2 mutation diagnosed in April 2022  PRIOR THERAPY: None  CURRENT THERAPY: Phlebotomy on as-needed basis  INTERVAL HISTORY: Sydney Reid 80 y.o. female returns to the clinic today for a follow up visit. The patient is feeling well today without any concerning adverse side effects. Regarding his polycythemia and suspicious hematochromatosis, the patient denies any skin itching, flushing, or rashes.  Denies any early satiety, bleeding, or bruising. Fatigue.  She denies any abdominal pain, nausea, or vomiting.  She is here today for evaluation and repeat blood work and consideration of a repeat therapeutic phlebotomy.  MEDICAL HISTORY: Past Medical History:  Diagnosis Date   Complication of anesthesia    Difficult intubation    20-25 years ago   H/O total knee replacement 10/28/1999   Right   High cholesterol    History of tonsillectomy and adenoidectomy 10/27/1944   History of transcatheter aortic valve replacement (TAVR) 08/06/2021   s/p Edwards 46mm S3 vis TF approach with Dr. Burt Knack and Dr. Cyndia Bent   Psoriatic arthritis [facility]) 10/27/1988   Sleep apnea with use of continuous positive airway pressure (CPAP) 10/27/2002   Thyroid disease     ALLERGIES:  is allergic to codeine and statins.  MEDICATIONS:  Current Outpatient Medications  Medication Sig Dispense Refill   ammonium lactate (LAC-HYDRIN) 12 % lotion Apply 1 application topically as needed for dry skin.     amoxicillin (AMOXIL) 500 MG tablet Take 1 tablet (500 mg total) by mouth as directed. Take 4 tablets by mouth 1 hour prior to dental work and cleanings 12 tablet 12   aspirin EC 81 MG tablet Take 81 mg by mouth daily. Swallow whole.     Cholecalciferol (VITAMIN D3) 250 MCG (10000 UT) capsule Take 4,000 Units by mouth daily.     clopidogrel (PLAVIX) 75 MG  tablet Take 75 mg by mouth daily. Pt will stop medication after she finishes current bottle     etanercept (ENBREL) 50 MG/ML injection Inject 50 mg into the skin every Sunday.      ezetimibe (ZETIA) 10 MG tablet Take 1 tablet (10 mg total) by mouth daily. 30 tablet 0   HYDROcodone-acetaminophen (NORCO) 5-325 MG tablet Take 1 tablet by mouth 2 (two) times daily. 60 tablet 0   levothyroxine (SYNTHROID) 88 MCG tablet Take one tablet by mouth once daily 30 minutes before breakfast on an empty stomach for thyroid. 90 tablet 3   simvastatin (ZOCOR) 10 MG tablet TAKE 1 TABLET BY MOUTH EVERYDAY AT BEDTIME 90 tablet 3   No current facility-administered medications for this visit.    SURGICAL HISTORY:  Past Surgical History:  Procedure Laterality Date   CATARACT EXTRACTION W/ INTRAOCULAR LENS IMPLANT Bilateral    FOOT SURGERY     fused arch in left foot   GROIN DISSECTION  08/06/2021   Procedure: GROIN EXPLORATION ;  Surgeon: Waynetta Sandy, MD;  Location: [facility];  Service: Vascular;;   INTRAOPERATIVE TRANSTHORACIC ECHOCARDIOGRAM N/A 08/06/2021   Procedure: INTRAOPERATIVE TRANSTHORACIC ECHOCARDIOGRAM;  Surgeon: Early Osmond, MD;  Location: [facility];  Service: Cardiovascular;  Laterality: N/A;   LOWER EXTREMITY ANGIOGRAM  08/06/2021   Procedure: LEFT LOWER EXTREMITY ANGIOGRAM;  Surgeon: Early Osmond, MD;  Location: [facility];  Service: Cardiovascular;;   PATCH ANGIOPLASTY  08/06/2021   Procedure: PATCH ANGIOPLASTY OF FEMORAL ARTERY USING LEFT GREATER SAPHENOUS  VEIN;  Surgeon: Waynetta Sandy, MD;  Location: [facility];  Service: Vascular;;   RIGHT/LEFT HEART CATH AND CORONARY ANGIOGRAPHY N/A 04/16/2021   Procedure: RIGHT/LEFT HEART CATH AND CORONARY ANGIOGRAPHY;  Surgeon: Adrian Prows, MD;  Location: [facility];  Service: Cardiovascular;  Laterality: N/A;   SPINE SURGERY     TONSILLECTOMY     TOTAL KNEE ARTHROPLASTY Left    TRANSCATHETER AORTIC VALVE REPLACEMENT, TRANSFEMORAL  N/A 08/06/2021   Procedure: TRANSCATHETER AORTIC VALVE REPLACEMENT, TRANSFEMORAL;  Surgeon: Early Osmond, MD;  Location: [facility];  Service: Cardiovascular;  Laterality: N/A;    REVIEW OF SYSTEMS:   Review of Systems  Constitutional: Negative for appetite change, chills, fatigue, fever and unexpected weight change.  HENT:   Negative for mouth sores, nosebleeds, sore throat and trouble swallowing.   Eyes: Negative for eye problems and icterus.  Respiratory: Negative for cough, hemoptysis, shortness of breath and wheezing.   Cardiovascular: Negative for chest pain and leg swelling.  Gastrointestinal: Negative for abdominal pain, constipation, diarrhea, nausea and vomiting.  Genitourinary: Negative for bladder incontinence, difficulty urinating, dysuria, frequency and hematuria.   Musculoskeletal: Negative for back pain, gait problem, neck pain and neck stiffness.  Skin: Negative for itching and rash.  Neurological: Negative for dizziness, extremity weakness, gait problem, headaches, light-headedness and seizures.  Hematological: Negative for adenopathy. Does not bruise/bleed easily.  Psychiatric/Behavioral: Negative for confusion, depression and sleep disturbance. The patient is not nervous/anxious.     PHYSICAL EXAMINATION:  There were no vitals taken for this visit.  ECOG PERFORMANCE STATUS: {CHL ONC ECOG [id number]  Physical Exam  Constitutional: Oriented to person, place, and time and well-developed, well-nourished, and in no distress. No distress.  HENT:  Head: Normocephalic and atraumatic.  Mouth/Throat: Oropharynx is clear and moist. No oropharyngeal exudate.  Eyes: Conjunctivae are normal. Right eye exhibits no discharge. Left eye exhibits no discharge. No scleral icterus.  Neck: Normal range of motion. Neck supple.  Cardiovascular: Normal rate, regular rhythm, normal heart sounds and intact distal pulses.   Pulmonary/Chest: Effort normal and breath sounds normal. No  respiratory distress. No wheezes. No rales.  Abdominal: Soft. Bowel sounds are normal. Exhibits no distension and no mass. There is no tenderness.  Musculoskeletal: Normal range of motion. Exhibits no edema.  Lymphadenopathy:    No cervical adenopathy.  Neurological: Alert and oriented to person, place, and time. Exhibits normal muscle tone. Gait normal. Coordination normal.  Skin: Skin is warm and dry. No rash noted. Not diaphoretic. No erythema. No pallor.  Psychiatric: Mood, memory and judgment normal.  Vitals reviewed.  LABORATORY DATA: Lab Results  Component Value Date   WBC 8.3 08/07/2021   HGB 12.1 08/07/2021   HCT 39.5 08/07/2021   MCV 83.3 08/07/2021   PLT 188 08/07/2021      Chemistry      Component Value Date/Time   NA 137 08/07/2021 0143   NA 142 06/11/2021 1035   K 4.3 08/07/2021 0143   CL 109 08/07/2021 0143   CO2 20 (L) 08/07/2021 0143   BUN 10 08/07/2021 0143   BUN 15 06/11/2021 1035   CREATININE 0.85 08/07/2021 0143   CREATININE 1.05 (H) 03/14/2021 1141   CREATININE 1.21 (H) 03/06/2021 0856   GLU 93 04/02/2016 0000      Component Value Date/Time   CALCIUM 8.6 (L) 08/07/2021 0143   ALKPHOS 59 08/02/2021 1441   AST 29 08/02/2021 1441   AST 21 03/14/2021 1141   ALT 23 08/02/2021 1441  ALT 13 03/14/2021 1141   BILITOT 0.9 08/02/2021 1441   BILITOT 0.5 03/14/2021 1141       RADIOGRAPHIC STUDIES:  VAS [place] ABI WITH/WO TBI  Result Date: 10/03/2021  LOWER EXTREMITY DOPPLER STUDY Patient Name:  ANESSIA OAKLAND  Date of Exam:   10/03/2021 Medical Rec #: [id number]           Accession #:    [id number] Date of Birth: [birth year]            Patient Gender: F Patient Age:   66 years Exam Location:  [facility] Procedure:      VAS [place] ABI WITH/WO TBI Referring Phys: --------------------------------------------------------------------------------  Indications: Patrint reports left leg High Risk Factors: Hyperlipidemia, past history of smoking.   Vascular Interventions: 08/06/2021 following TAVR via left common femoral                         approach                         Pre-operative Diagnosis: Acute left lower extremity                         ischemia                         Post-operative diagnosis: Same                         Surgeon: Erlene Quan C. Donzetta Matters, MD                         Assistant: Paul Half, PA                         Procedure Performed: 1. Left common femoral                         endarterectomy with vein patch angioplasty 2. Mynx                         device closure right common femoral artery sheath. Performing Technologist: Ronal Fear RVS, RCS  Examination Guidelines: A complete evaluation includes at minimum, Doppler waveform signals and systolic blood pressure reading at the level of bilateral brachial, anterior tibial, and posterior tibial arteries, when vessel segments are accessible. Bilateral testing is considered an integral part of a complete examination. Photoelectric Plethysmograph (PPG) waveforms and toe systolic pressure readings are included as required and additional duplex testing as needed. Limited examinations for reoccurring indications may be performed as noted.  ABI Findings: +---------+------------------+-----+----------+--------+  Right     Rt Pressure (mmHg) Index Waveform   Comment   +---------+------------------+-----+----------+--------+  Brachial  127                                           +---------+------------------+-----+----------+--------+  PTA       65                 0.51  monophasic           +---------+------------------+-----+----------+--------+  DP        85  0.67  biphasic             +---------+------------------+-----+----------+--------+  Great Toe 52                 0.41                       +---------+------------------+-----+----------+--------+ +--------+------------------+-----+----------+-------+  Left     Lt Pressure (mmHg) Index Waveform   Comment   +--------+------------------+-----+----------+-------+  Brachial 127                                          +--------+------------------+-----+----------+-------+  PTA      69                 0.54  monophasic          +--------+------------------+-----+----------+-------+  DP       77                 0.61  monophasic          +--------+------------------+-----+----------+-------+ +-------+-----------+----------------+------------+------------+  ABI/TBI Today's ABI Today's TBI      Previous ABI Previous TBI  +-------+-----------+----------------+------------+------------+  Right   0.67        0.41                                        +-------+-----------+----------------+------------+------------+  Left    0.61        unable to obtain                            +-------+-----------+----------------+------------+------------+   Summary: Right: Resting right ankle-brachial index indicates moderate right lower extremity arterial disease. The right toe-brachial index is abnormal. Left: Resting left ankle-brachial index indicates moderate left lower extremity arterial disease.  *See table(s) above for measurements and observations.  Electronically signed by Deitra Mayo MD on 10/03/2021 at 2:46:24 PM.    Final    VAS [place] LOWER EXTREMITY ARTERIAL DUPLEX  Result Date: 10/03/2021 LOWER EXTREMITY ARTERIAL DUPLEX STUDY Patient Name:  SEYNABOU FULTS  Date of Exam:   10/03/2021 Medical Rec #: [id number]           Accession #:    [id number] Date of Birth: 1941/03/07            Patient Gender: F Patient Age:   34 years Exam Location:  [facility] Procedure:      VAS [place] LOWER EXTREMITY ARTERIAL DUPLEX Referring Phys: Risa Grill --------------------------------------------------------------------------------  Indications: Rest pain, peripheral artery disease, and Patient reports left leg              pain at night. High Risk Factors: Hyperlipidemia, past history of smoking.  Vascular Interventions:  08/06/2021                         Pre-operative Diagnosis: Acute left lower extremity                         ischemia                         Post-operative diagnosis: Same  Surgeon: Eda Paschal. Donzetta Matters, MD                         Assistant: Paul Half, PA                         Procedure Performed: 1. Left common femoral                         endarterectomy with vein patch angioplasty 2. Mynx                         device closure right common femoral artery sheath. Current ABI:            R=0.67, L=0.61 Performing Technologist: Ronal Fear RVS, RCS  Examination Guidelines: A complete evaluation includes B-mode imaging, spectral Doppler, color Doppler, and power Doppler as needed of all accessible portions of each vessel. Bilateral testing is considered an integral part of a complete examination. Limited examinations for reoccurring indications may be performed as noted.   +----------+--------+-----+---------------+----------+--------+  LEFT       PSV cm/s Ratio Stenosis        Waveform   Comments  +----------+--------+-----+---------------+----------+--------+  CFA Distal 116                            biphasic             +----------+--------+-----+---------------+----------+--------+  DFA        87                             biphasic             +----------+--------+-----+---------------+----------+--------+  SFA Prox   157            30-49% stenosis biphasic             +----------+--------+-----+---------------+----------+--------+  SFA Mid    157            30-49% stenosis biphasic             +----------+--------+-----+---------------+----------+--------+  SFA Distal 40                             monophasic           +----------+--------+-----+---------------+----------+--------+  POP Prox   43                             monophasic           +----------+--------+-----+---------------+----------+--------+  POP Distal 58                             monophasic            +----------+--------+-----+---------------+----------+--------+  ATA Distal 28                             monophasic           +----------+--------+-----+---------------+----------+--------+  Summary: Left: 30-49% stenosis noted in the superficial femoral artery. Widely patent common femoral artery. Patent diffusely diseased superficial femoral artery. Minimal flow observed at the level of the adductor canal. Patent diffusely diseased popliteal artery.  No flow observed in the posterior tibial artery.  See table(s) above for measurements and observations. Electronically signed by Deitra Mayo MD on 10/03/2021 at 2:45:43 PM.    Final      ASSESSMENT/PLAN:  This is a very pleasant 80 year old Caucasian female recently diagnosed with polycythemia vera with positive JAK2 mutation.  The patient undergoes therapeutic phlebotomies to keep her hematocrit less than 45%.  The patient CBC today shows a hematocrit of ***  We will not arrange for phlebotomy at this time.  We will see her back for follow-up visit in 4 months for evaluation and repeat CBC in phlebotomy if needed.  The patient was advised to call immediately if she has any concerning symptoms in the interval. The patient voices understanding of current disease status and treatment options and is in agreement with the current care plan. All questions were answered. The patient knows to call the clinic with any problems, questions or concerns. We can certainly see the patient much sooner if necessary      No orders of the defined types were placed in this encounter.    I spent {CHL ONC TIME VISIT - PBDHD:[id number]} counseling the patient face to face. The total time spent in the appointment was {CHL ONC TIME VISIT - KSKSH:[id number]}.  Vernessa Likes L Andrzej Scully, PA-C 10/18/21

## 2021-10-22 ENCOUNTER — Other Ambulatory Visit: Payer: Self-pay

## 2021-10-22 ENCOUNTER — Ambulatory Visit: Payer: Medicare Other

## 2021-10-22 DIAGNOSIS — I6523 Occlusion and stenosis of bilateral carotid arteries: Secondary | ICD-10-CM | POA: Diagnosis not present

## 2021-10-24 ENCOUNTER — Other Ambulatory Visit: Payer: Medicare Other

## 2021-10-24 ENCOUNTER — Ambulatory Visit: Payer: Medicare Other | Admitting: Physician Assistant

## 2021-10-30 ENCOUNTER — Other Ambulatory Visit: Payer: Self-pay

## 2021-10-30 ENCOUNTER — Ambulatory Visit: Payer: Medicare Other | Admitting: Cardiology

## 2021-10-30 ENCOUNTER — Encounter: Payer: Self-pay | Admitting: Cardiology

## 2021-10-30 VITALS — BP 139/72 | HR 73 | Temp 98.0°F | Resp 17 | Ht 65.0 in | Wt 146.0 lb

## 2021-10-30 DIAGNOSIS — R42 Dizziness and giddiness: Secondary | ICD-10-CM

## 2021-10-30 DIAGNOSIS — I739 Peripheral vascular disease, unspecified: Secondary | ICD-10-CM | POA: Diagnosis not present

## 2021-10-30 DIAGNOSIS — I6523 Occlusion and stenosis of bilateral carotid arteries: Secondary | ICD-10-CM

## 2021-10-30 DIAGNOSIS — E782 Mixed hyperlipidemia: Secondary | ICD-10-CM

## 2021-10-30 DIAGNOSIS — Z952 Presence of prosthetic heart valve: Secondary | ICD-10-CM

## 2021-10-30 DIAGNOSIS — M792 Neuralgia and neuritis, unspecified: Secondary | ICD-10-CM | POA: Diagnosis not present

## 2021-10-30 DIAGNOSIS — L603 Nail dystrophy: Secondary | ICD-10-CM | POA: Diagnosis not present

## 2021-10-30 DIAGNOSIS — L4052 Psoriatic arthritis mutilans: Secondary | ICD-10-CM | POA: Diagnosis not present

## 2021-10-30 DIAGNOSIS — B351 Tinea unguium: Secondary | ICD-10-CM | POA: Diagnosis not present

## 2021-10-30 DIAGNOSIS — L84 Corns and callosities: Secondary | ICD-10-CM | POA: Diagnosis not present

## 2021-10-30 NOTE — Progress Notes (Signed)
Primary Physician/Referring:  [name], NP  Patient ID: Sydney Reid, female    DOB: 1941/04/08, 81 y.o.   MRN: [id number]  Chief Complaint  Patient presents with   Follow-up   Aortic Stenosis   Hyperlipidemia   HPI:    Sydney Reid  is a 81 y.o. Caucasian female patient with aortic stenosis, stage IIIa chronic kidney disease, hyperlipidemia, obstructive sleep apnea on CPAP, lechocardiogram on 02/19/2021 revealing severe aortic stenosis. Underwent TAVR on 08/17/2021 with placement of a 23 mm Edwards CPN valve.  She has noticed improvement in dyspnea and also chest pain at night.   Her other complaint is gait instability and she feels loss of coordination and runs into objects, no history of injury or fall, has frequent dizziness, no syncope.     Past Medical History:  Diagnosis Date   Complication of anesthesia    Difficult intubation    20-25 years ago   H/O total knee replacement 10/28/1999   Right   High cholesterol    History of tonsillectomy and adenoidectomy 10/27/1944   History of transcatheter aortic valve replacement (TAVR) 08/06/2021   s/p Edwards 62mm S3 vis TF approach with Dr. Burt Knack and Dr. Cyndia Bent   Psoriatic arthritis [facility]) 10/27/1988   Sleep apnea with use of continuous positive airway pressure (CPAP) 10/27/2002   Thyroid disease    Past Surgical History:  Procedure Laterality Date   CATARACT EXTRACTION W/ INTRAOCULAR LENS IMPLANT Bilateral    FOOT SURGERY     fused arch in left foot   GROIN DISSECTION  08/06/2021   Procedure: Virl Son EXPLORATION ;  Surgeon: Waynetta Sandy, MD;  Location: [place];  Service: Vascular;;   INTRAOPERATIVE TRANSTHORACIC ECHOCARDIOGRAM N/A 08/06/2021   Procedure: INTRAOPERATIVE TRANSTHORACIC ECHOCARDIOGRAM;  Surgeon: Early Osmond, MD;  Location: [place];  Service: Cardiovascular;  Laterality: N/A;   LOWER EXTREMITY ANGIOGRAM  08/06/2021   Procedure: LEFT LOWER EXTREMITY ANGIOGRAM;  Surgeon: Early Osmond, MD;  Location: [place];  Service: Cardiovascular;;   PATCH ANGIOPLASTY  08/06/2021   Procedure: PATCH ANGIOPLASTY OF FEMORAL ARTERY USING LEFT GREATER SAPHENOUS VEIN;  Surgeon: Waynetta Sandy, MD;  Location: [place];  Service: Vascular;;   RIGHT/LEFT HEART CATH AND CORONARY ANGIOGRAPHY N/A 04/16/2021   Procedure: RIGHT/LEFT HEART CATH AND CORONARY ANGIOGRAPHY;  Surgeon: Adrian Prows, MD;  Location: [place] [facility];  Service: Cardiovascular;  Laterality: N/A;   SPINE SURGERY     TONSILLECTOMY     TOTAL KNEE ARTHROPLASTY Left    TRANSCATHETER AORTIC VALVE REPLACEMENT, TRANSFEMORAL N/A 08/06/2021   Procedure: TRANSCATHETER AORTIC VALVE REPLACEMENT, TRANSFEMORAL;  Surgeon: Early Osmond, MD;  Location: [facility];  Service: Cardiovascular;  Laterality: N/A;   Family History  Problem Relation Age of Onset   Heart attack Father    Heart disease Sister     Social History   Tobacco Use   Smoking status: Former    Packs/day: 2.00    Years: 18.00    Pack years: 36.00    Types: Cigarettes    Quit date: 1975    Years since quitting: 48.0   Smokeless tobacco: Never  Substance Use Topics   Alcohol use: Yes    Alcohol/week: 1.0 - 2.0 standard drink    Types: 1 - 2 Standard drinks or equivalent per week    Comment: Social drinker, couple times a week   Marital Status: Married  ROS  Review of Systems  Cardiovascular:  Positive for dyspnea  on exertion. Negative for chest pain and leg swelling.  Musculoskeletal:  Positive for arthritis and back pain.  Gastrointestinal:  Negative for melena.  Neurological:  Positive for disturbances in coordination.  Objective  Blood pressure 139/72, pulse 73, temperature 98 F (36.7 C), temperature source Temporal, resp. rate 17, height 5\' 5"  (1.651 m), weight 146 lb (66.2 kg), SpO2 99 %. Body mass index is 24.3 kg/m.  Vitals with BMI 10/30/2021 10/03/2021 09/04/2021  Height 5\' 5"  5\' 5"  5\' 5"   Weight 146 lbs 143 lbs 6 oz 141 lbs  BMI 24.3 16.10  96.04  Systolic 540 981 191  Diastolic 72 69 68  Pulse 73 92 74    Orthostatic VS for the past 72 hrs (Last 3 readings):  Orthostatic BP Patient Position BP Location Cuff Size Orthostatic Pulse  10/30/21 1510 131/75 Standing Left Arm Normal 96  10/30/21 1509 122/71 Sitting Left Arm Normal 88  10/30/21 1508 111/69 Supine Left Arm Normal 82     Physical Exam Constitutional:      Comments: Short stature  Neck:     Vascular: No carotid bruit or JVD.  Cardiovascular:     Rate and Rhythm: Normal rate and regular rhythm.     Pulses: Intact distal pulses.     Heart sounds: Murmur heard.  Harsh crescendo midsystolic murmur is present with a grade of 3/6 at the upper right sternal border radiating to the neck.    No gallop.  Pulmonary:     Effort: Pulmonary effort is normal.     Breath sounds: Normal breath sounds.  Abdominal:     General: Bowel sounds are normal.     Palpations: Abdomen is soft.  Musculoskeletal:        General: No swelling.     Laboratory examination:   Recent Labs    01/21/21 1630 01/29/21 1125 03/06/21 0856 03/14/21 1141 04/12/21 1002 06/11/21 1035 08/02/21 1441 08/06/21 1159 08/07/21 0143  NA 141   < > 141 140   < > 142 138 142 137  K 4.4   < > 4.5 4.3   < > 4.7 4.6 4.1 4.3  CL 108   < > 107 108   < > 104 109 108 109  CO2 22   < > 24 24   < > 23 21*  --  20*  GLUCOSE 90   < > 90 95   < > 98 97 119* 139*  BUN 16   < > 15 17   < > 15 14 12 10   CREATININE 0.98*   < > 1.21* 1.05*   < > 0.91 0.88 0.80 0.85  CALCIUM 9.8   < > 9.5 9.5   < > 9.7 9.3  --  8.6*  GFRNONAA 54*   < > 42* 53*  --   --  >60  --  >60  GFRAA 63  --  49*  --   --   --   --   --   --    < > = values in this interval not displayed.   CrCl cannot be calculated (Patient's most recent lab result is older than the maximum 21 days allowed.).  CMP Latest Ref Rng & Units 08/07/2021 08/06/2021 08/02/2021  Glucose 70 - 99 mg/dL 139(H) 119(H) 97  BUN 8 - 23 mg/dL 10 12 14   Creatinine 0.44 -  1.00 mg/dL 0.85 0.80 0.88  Sodium 135 - 145 mmol/L 137 142 138  Potassium 3.5 - 5.1  mmol/L 4.3 4.1 4.6  Chloride 98 - 111 mmol/L 109 108 109  CO2 22 - 32 mmol/L 20(L) - 21(L)  Calcium 8.9 - 10.3 mg/dL 8.6(L) - 9.3  Total Protein 6.5 - 8.1 g/dL - - 7.8  Total Bilirubin 0.3 - 1.2 mg/dL - - 0.9  Alkaline Phos 38 - 126 U/L - - 59  AST 15 - 41 U/L - - 29  ALT 0 - 44 U/L - - 23   CBC Latest Ref Rng & Units 08/07/2021 08/06/2021 08/02/2021  WBC 4.0 - 10.5 K/uL 8.3 - 7.3  Hemoglobin 12.0 - 15.0 g/dL 12.1 13.6 14.6  Hematocrit 36.0 - 46.0 % 39.5 40.0 46.6(H)  Platelets 150 - 400 K/uL 188 - 254    Lipid Panel Recent Labs    03/06/21 0856 06/06/21 1012  CHOL 193 155  TRIG 129 107  LDLCALC 114* 87  HDL 55 48  CHOLHDL 3.5  --    HEMOGLOBIN A1C No results found for: HGBA1C, MPG TSH Recent Labs    01/21/21 1630 03/06/21 0856  TSH 0.58 1.11   Medications and allergies   Allergies  Allergen Reactions   Codeine Nausea And Vomiting    Weakness and dysequilibrium   Statins Hives    Myalgias     Medication prior to this encounter:   Outpatient Medications Prior to Visit  Medication Sig Dispense Refill   ammonium lactate (LAC-HYDRIN) 12 % lotion Apply 1 application topically as needed for dry skin.     amoxicillin (AMOXIL) 500 MG tablet Take 1 tablet (500 mg total) by mouth as directed. Take 4 tablets by mouth 1 hour prior to dental work and cleanings 12 tablet 12   aspirin EC 81 MG tablet Take 81 mg by mouth daily. Swallow whole.     Cholecalciferol (VITAMIN D3) 250 MCG (10000 UT) capsule Take 4,000 Units by mouth daily.     clopidogrel (PLAVIX) 75 MG tablet Take 75 mg by mouth daily. Pt will stop medication after she finishes current bottle     etanercept (ENBREL) 50 MG/ML injection Inject 50 mg into the skin every Sunday.      ezetimibe (ZETIA) 10 MG tablet Take 1 tablet (10 mg total) by mouth daily. 30 tablet 0   HYDROcodone-acetaminophen (NORCO) 5-325 MG tablet Take 1 tablet  by mouth 2 (two) times daily. 60 tablet 0   levothyroxine (SYNTHROID) 88 MCG tablet Take one tablet by mouth once daily 30 minutes before breakfast on an empty stomach for thyroid. 90 tablet 3   simvastatin (ZOCOR) 10 MG tablet TAKE 1 TABLET BY MOUTH EVERYDAY AT BEDTIME 90 tablet 3   No facility-administered medications prior to visit.    FINAL MEDICATION AS OF TODAY:   Current Meds  Medication Sig   ammonium lactate (LAC-HYDRIN) 12 % lotion Apply 1 application topically as needed for dry skin.   amoxicillin (AMOXIL) 500 MG tablet Take 1 tablet (500 mg total) by mouth as directed. Take 4 tablets by mouth 1 hour prior to dental work and cleanings   aspirin EC 81 MG tablet Take 81 mg by mouth daily. Swallow whole.   Cholecalciferol (VITAMIN D3) 250 MCG (10000 UT) capsule Take 4,000 Units by mouth daily.   clopidogrel (PLAVIX) 75 MG tablet Take 75 mg by mouth daily. Pt will stop medication after she finishes current bottle   etanercept (ENBREL) 50 MG/ML injection Inject 50 mg into the skin every Sunday.    ezetimibe (ZETIA) 10 MG tablet Take 1 tablet (  10 mg total) by mouth daily.   HYDROcodone-acetaminophen (NORCO) 5-325 MG tablet Take 1 tablet by mouth 2 (two) times daily.   levothyroxine (SYNTHROID) 88 MCG tablet Take one tablet by mouth once daily 30 minutes before breakfast on an empty stomach for thyroid.   simvastatin (ZOCOR) 10 MG tablet TAKE 1 TABLET BY MOUTH EVERYDAY AT BEDTIME   Medications after current encounter Current Outpatient Medications  Medication Instructions   ammonium lactate (LAC-HYDRIN) 12 % lotion 1 application, Topical, As needed   amoxicillin (AMOXIL) 500 mg, Oral, As directed, Take 4 tablets by mouth 1 hour prior to dental work and cleanings   aspirin EC 81 mg, Oral, Daily, Swallow whole.   clopidogrel (PLAVIX) 75 mg, Oral, Daily, Pt will stop medication after she finishes current bottle   etanercept (ENBREL) 50 mg, Subcutaneous, Every Sun   ezetimibe (ZETIA) 10  mg, Oral, Daily   HYDROcodone-acetaminophen (NORCO) 5-325 MG tablet 1 tablet, Oral, 2 times daily   levothyroxine (SYNTHROID) 88 MCG tablet Take one tablet by mouth once daily 30 minutes before breakfast on an empty stomach for thyroid.   simvastatin (ZOCOR) 10 MG tablet TAKE 1 TABLET BY MOUTH EVERYDAY AT BEDTIME   Vitamin D3 4,000 Units, Oral, Daily    Radiology:   No results found.  Cardiac Studies:   Right and left heart catheterization 04/16/2021: Hemodynamic data: RA 2/1, mean 0 mmHg.  RV 24/-2, EDP 1 mmHg.  PA 24/6, mean 14 mmHg.  PW 9/4, mean 5 mmHg. PA saturation 78%, aortic saturation 100%.  Cardiac output by Fick was 5.16 with a cardiac index of 2.98. LV: 177/2, EDP 13 mmHg.  Aorta: 137/67, mean 95 mmHg. Peak to peak gradient 33 with a mean gradient of 29.4 mmHg.  Calculated aortic valve area 0.88 cm.  Angiographic data: Left main: Mildly calcified with mild luminal irregularity. CX: Large vessel, giving origin to small marginals and continues in the AV groove.  Proximal CX has calcified 60 to 70% stenosis.  OM 3 is the largest vessel with a proximal 80% calcific stenosis.  OM 1 and 2 are very small. LAD: Large-caliber vessel, mild diffuse disease, mild calcification is evident in the proximal and mid segment.  D1 and D2 are moderate sized without significant disease. RCA: Large-caliber vessel with mild diffuse disease and mild coronary calcification.  Carotid artery duplex 10/22/2021: Duplex suggests stenosis in the right internal carotid artery (50-69%). Duplex suggests stenosis in the left internal carotid artery (16-49%). Duplex suggests stenosis in the left external carotid artery (<50%). Antegrade right vertebral artery flow.  No significant change from 04/12/2021. Follow up in six months is appropriate if clinically indicated    Echocardiogram 09/04/21:   1. Left ventricular ejection fraction, by estimation, is 60 to 65%. The left ventricle has normal function. The  left ventricle has no regional wall motion abnormalities. Left ventricular diastolic parameters are indeterminate.  2. Right ventricular systolic function is normal. The right ventricular size is normal.  3. The mitral valve is normal in structure. No evidence of mitral valve regurgitation. No evidence of mitral stenosis.  4. The aortic valve has been repaired/replaced. Aortic valve regurgitation is not visualized. No aortic stenosis is present. There is a 23 mm Sapien prosthetic (TAVR) valve present in the aortic position. Echo findings are consistent with normal  structure and function of the aortic valve prosthesis. Aortic valve mean gradient measures 8.2 mmHg. Aortic valve Vmax measures 1.98 m/s.  5. The inferior vena cava is normal in size  with greater than 50% respiratory variability, suggesting right atrial pressure of 3 mmHg  EKG:   EKG 10/30/2021: Normal sinus rhythm at rate of 77 bpm, left renal enlargement, normal axis.  Incomplete right bundle branch block.  Otherwise normal EKG. No significant change from 03/29/2021.    Assessment     ICD-10-CM   1. Asymptomatic bilateral carotid artery stenosis  I65.23 PCV CAROTID DUPLEX (BILATERAL)    2. Mixed hyperlipidemia  E78.2     3. S/P TAVR (transcatheter aortic valve replacement)  23 mm Particia Lather Valve 08/17/2021  Z95.2 EKG 12-Lead    AMB referral to cardiac rehabilitation    4. Dizziness and giddiness  R42       There are no discontinued medications.  No orders of the defined types were placed in this encounter.   Orders Placed This Encounter  Procedures   AMB referral to cardiac rehabilitation    Referral Priority:   Routine    Referral Type:   Consultation    Number of Visits Requested:   1   EKG 12-Lead   Recommendations:   POLLYANNA LEVAY is a 81 y.o. Caucasian female patient with aortic stenosis, stage IIIa chronic kidney disease, hyperlipidemia, obstructive sleep apnea on CPAP, lechocardiogram on 02/19/2021  revealing severe aortic stenosis. Underwent TAVR on 08/17/2021 with placement of a 23 mm Edwards CPN valve.  She has noticed improvement in dyspnea and also chest pain at night.  Her main complaint today is marked dizziness that occurs at any time especially when she is standing.  No syncope.  No other associated symptoms.  I performed orthostatics which is negative.  Her symptoms are very atypical, do not suspect inner ear problems or vestibular problems.  Patient states that the symptoms are mild.  With regard to hypertension, blood pressure is very well controlled.  Lipids are at goal and carotid artery duplex revealed mild to moderate disease bilaterally, will continue to do surveillance duplex in 6 months.  I like to see her back then.  She has not heard back from cardiac rehab, I will make another referral.      Adrian Prows, MD, [facility] 10/30/2021, 5:32 PM Office: [phone]

## 2021-11-14 ENCOUNTER — Telehealth (HOSPITAL_COMMUNITY): Payer: Self-pay

## 2021-11-14 NOTE — Telephone Encounter (Signed)
Called and spoke with pt in regards to CR, ask pt questions per the note below. Pt stated she is not very mobile due to her back issues and severe psoriatic arthritis. Gave pt a very detail description of CR, pt then stated that we will need to order "special equipment" for her due medical issues. She also stated her dizziness has been going on for 4 yrs now and she doesn't walk due to her psoriatic arthritis and her back issues   Adv pt she may need to contact her dr for PT.

## 2021-12-05 ENCOUNTER — Encounter: Payer: Self-pay | Admitting: Cardiology

## 2021-12-05 ENCOUNTER — Telehealth: Payer: Self-pay | Admitting: Cardiology

## 2021-12-05 ENCOUNTER — Other Ambulatory Visit: Payer: Self-pay

## 2021-12-05 ENCOUNTER — Ambulatory Visit: Payer: Medicare Other | Admitting: Cardiology

## 2021-12-05 VITALS — BP 135/84 | HR 94 | Temp 97.4°F | Resp 16 | Ht 65.0 in | Wt 147.6 lb

## 2021-12-05 DIAGNOSIS — R0609 Other forms of dyspnea: Secondary | ICD-10-CM

## 2021-12-05 DIAGNOSIS — Z952 Presence of prosthetic heart valve: Secondary | ICD-10-CM | POA: Diagnosis not present

## 2021-12-05 DIAGNOSIS — R42 Dizziness and giddiness: Secondary | ICD-10-CM | POA: Diagnosis not present

## 2021-12-05 DIAGNOSIS — I6523 Occlusion and stenosis of bilateral carotid arteries: Secondary | ICD-10-CM | POA: Diagnosis not present

## 2021-12-05 NOTE — Progress Notes (Signed)
Primary Physician/Referring:  [name], NP  Patient ID: Sydney Reid, female    DOB: 1940-11-13, 81 y.o.   MRN: [id number]  Chief Complaint  Patient presents with   Coronary Artery Disease   HPI:    Sydney Reid  is a 81 y.o. Caucasian female patient with aortic stenosis, stage IIIa chronic kidney disease, hyperlipidemia, obstructive sleep apnea on CPAP, echocardiogram on 02/19/2021 revealing severe aortic stenosis. Underwent TAVR on 08/17/2021 with placement of a 23 mm Edwards CPN valve.    She has chronic dizziness but still very independent.  She has psoriatic arthritis with marked degenerative joint disease involving her hands but still able to cook and maintain all physical activities without limitations. No history of injury or fall, has frequent dizziness, no syncope.     Past Medical History:  Diagnosis Date   Complication of anesthesia    Difficult intubation    20-25 years ago   H/O total knee replacement 10/28/1999   Right   High cholesterol    History of tonsillectomy and adenoidectomy 10/27/1944   History of transcatheter aortic valve replacement (TAVR) 08/06/2021   s/p Edwards 39mm S3 vis TF approach with Dr. Burt Knack and Dr. Cyndia Bent   Psoriatic arthritis [facility]) 10/27/1988   Sleep apnea with use of continuous positive airway pressure (CPAP) 10/27/2002   Thyroid disease    Past Surgical History:  Procedure Laterality Date   CATARACT EXTRACTION W/ INTRAOCULAR LENS IMPLANT Bilateral    FOOT SURGERY     fused arch in left foot   GROIN DISSECTION  08/06/2021   Procedure: Virl Son EXPLORATION ;  Surgeon: Waynetta Sandy, MD;  Location: [place];  Service: Vascular;;   INTRAOPERATIVE TRANSTHORACIC ECHOCARDIOGRAM N/A 08/06/2021   Procedure: INTRAOPERATIVE TRANSTHORACIC ECHOCARDIOGRAM;  Surgeon: Early Osmond, MD;  Location: [place];  Service: Cardiovascular;  Laterality: N/A;   LOWER EXTREMITY ANGIOGRAM  08/06/2021   Procedure: LEFT LOWER EXTREMITY  ANGIOGRAM;  Surgeon: Early Osmond, MD;  Location: [place];  Service: Cardiovascular;;   PATCH ANGIOPLASTY  08/06/2021   Procedure: PATCH ANGIOPLASTY OF FEMORAL ARTERY USING LEFT GREATER SAPHENOUS VEIN;  Surgeon: Waynetta Sandy, MD;  Location: [place];  Service: Vascular;;   RIGHT/LEFT HEART CATH AND CORONARY ANGIOGRAPHY N/A 04/16/2021   Procedure: RIGHT/LEFT HEART CATH AND CORONARY ANGIOGRAPHY;  Surgeon: Adrian Prows, MD;  Location: [place] [facility];  Service: Cardiovascular;  Laterality: N/A;   SPINE SURGERY     TONSILLECTOMY     TOTAL KNEE ARTHROPLASTY Left    TRANSCATHETER AORTIC VALVE REPLACEMENT, TRANSFEMORAL N/A 08/06/2021   Procedure: TRANSCATHETER AORTIC VALVE REPLACEMENT, TRANSFEMORAL;  Surgeon: Early Osmond, MD;  Location[facility];  Service: Cardiovascular;  Laterality: N/A;   Family History  Problem Relation Age of Onset   Heart attack Father    Heart disease Sister     Social History   Tobacco Use   Smoking status: Former    Packs/day: 2.00    Years: 18.00    Pack years: 36.00    Types: Cigarettes    Quit date: 1975    Years since quitting: 48.1   Smokeless tobacco: Never  Substance Use Topics   Alcohol use: Yes    Alcohol/week: 1.0 - 2.0 standard drink    Types: 1 - 2 Standard drinks or equivalent per week    Comment: Social drinker, couple times a week   Marital Status: Married  ROS  Review of Systems  Cardiovascular:  Positive for dyspnea  on exertion. Negative for chest pain and leg swelling.  Musculoskeletal:  Positive for arthritis and back pain.  Gastrointestinal:  Negative for melena.  Neurological:  Positive for disturbances in coordination.  Objective  Blood pressure 135/84, pulse 94, temperature (!) 97.4 F (36.3 C), temperature source Temporal, resp. rate 16, height 5\' 5"  (1.651 m), weight 147 lb 9.6 oz (67 kg), SpO2 99 %. Body mass index is 24.56 kg/m.  Vitals with BMI 12/05/2021 10/30/2021 10/03/2021  Height 5\' 5"  5\' 5"  5\' 5"   Weight 147  lbs 10 oz 146 lbs 143 lbs 6 oz  BMI 24.56 48.5 46.27  Systolic 035 009 381  Diastolic 84 72 69  Pulse 94 73 92    Orthostatic VS for the past 72 hrs (Last 3 readings):  Patient Position BP Location Cuff Size  12/05/21 1449 Sitting Left Arm Normal     Physical Exam Constitutional:      Comments: Short stature  Neck:     Vascular: No carotid bruit or JVD.  Cardiovascular:     Rate and Rhythm: Normal rate and regular rhythm.     Pulses: Intact distal pulses.     Heart sounds: Murmur heard.  Harsh crescendo midsystolic murmur is present with a grade of 3/6 at the upper right sternal border radiating to the neck.    No gallop.  Pulmonary:     Effort: Pulmonary effort is normal.     Breath sounds: Normal breath sounds.  Musculoskeletal:        General: Deformity (bilateral atrophied fingers) present.     Right lower leg: No edema.     Left lower leg: No edema.     Laboratory examination:   Recent Labs    01/21/21 1630 01/29/21 1125 03/06/21 0856 03/14/21 1141 04/12/21 1002 06/11/21 1035 08/02/21 1441 08/06/21 1159 08/07/21 0143  NA 141   < > 141 140   < > 142 138 142 137  K 4.4   < > 4.5 4.3   < > 4.7 4.6 4.1 4.3  CL 108   < > 107 108   < > 104 109 108 109  CO2 22   < > 24 24   < > 23 21*  --  20*  GLUCOSE 90   < > 90 95   < > 98 97 119* 139*  BUN 16   < > 15 17   < > 15 14 12 10   CREATININE 0.98*   < > 1.21* 1.05*   < > 0.91 0.88 0.80 0.85  CALCIUM 9.8   < > 9.5 9.5   < > 9.7 9.3  --  8.6*  GFRNONAA 54*   < > 42* 53*  --   --  >60  --  >60  GFRAA 63  --  49*  --   --   --   --   --   --    < > = values in this interval not displayed.   CrCl cannot be calculated (Patient's most recent lab result is older than the maximum 21 days allowed.).  CMP Latest Ref Rng & Units 08/07/2021 08/06/2021 08/02/2021  Glucose 70 - 99 mg/dL 139(H) 119(H) 97  BUN 8 - 23 mg/dL 10 12 14   Creatinine 0.44 - 1.00 mg/dL 0.85 0.80 0.88  Sodium 135 - 145 mmol/L 137 142 138  Potassium 3.5 -  5.1 mmol/L 4.3 4.1 4.6  Chloride 98 - 111 mmol/L 109 108 109  CO2 22 -  32 mmol/L 20(L) - 21(L)  Calcium 8.9 - 10.3 mg/dL 8.6(L) - 9.3  Total Protein 6.5 - 8.1 g/dL - - 7.8  Total Bilirubin 0.3 - 1.2 mg/dL - - 0.9  Alkaline Phos 38 - 126 U/L - - 59  AST 15 - 41 U/L - - 29  ALT 0 - 44 U/L - - 23   CBC Latest Ref Rng & Units 08/07/2021 08/06/2021 08/02/2021  WBC 4.0 - 10.5 K/uL 8.3 - 7.3  Hemoglobin 12.0 - 15.0 g/dL 12.1 13.6 14.6  Hematocrit 36.0 - 46.0 % 39.5 40.0 46.6(H)  Platelets 150 - 400 K/uL 188 - 254    Lipid Panel Recent Labs    03/06/21 0856 06/06/21 1012  CHOL 193 155  TRIG 129 107  LDLCALC 114* 87  HDL 55 48  CHOLHDL 3.5  --    HEMOGLOBIN A1C No results found for: HGBA1C, MPG TSH Recent Labs    01/21/21 1630 03/06/21 0856  TSH 0.58 1.11   Medications and allergies   Allergies  Allergen Reactions   Codeine Nausea And Vomiting    Weakness and dysequilibrium   Statins Hives    Myalgias     Medication prior to this encounter:   Outpatient Medications Prior to Visit  Medication Sig Dispense Refill   ammonium lactate (LAC-HYDRIN) 12 % lotion Apply 1 application topically as needed for dry skin.     aspirin EC 81 MG tablet Take 81 mg by mouth daily. Swallow whole.     Cholecalciferol (VITAMIN D3) 250 MCG (10000 UT) capsule Take 4,000 Units by mouth daily.     etanercept (ENBREL) 50 MG/ML injection Inject 50 mg into the skin every Sunday.      ezetimibe (ZETIA) 10 MG tablet Take 1 tablet (10 mg total) by mouth daily. 30 tablet 0   HYDROcodone-acetaminophen (NORCO) 5-325 MG tablet Take 1 tablet by mouth 2 (two) times daily. 60 tablet 0   levothyroxine (SYNTHROID) 88 MCG tablet Take one tablet by mouth once daily 30 minutes before breakfast on an empty stomach for thyroid. 90 tablet 3   simvastatin (ZOCOR) 10 MG tablet TAKE 1 TABLET BY MOUTH EVERYDAY AT BEDTIME 90 tablet 3   amoxicillin (AMOXIL) 500 MG tablet Take 1 tablet (500 mg total) by mouth as directed.  Take 4 tablets by mouth 1 hour prior to dental work and cleanings (Patient not taking: Reported on 12/05/2021) 12 tablet 12   clopidogrel (PLAVIX) 75 MG tablet Take 75 mg by mouth daily. Pt will stop medication after she finishes current bottle     No facility-administered medications prior to visit.    FINAL MEDICATION AS OF TODAY:   Current Meds  Medication Sig   ammonium lactate (LAC-HYDRIN) 12 % lotion Apply 1 application topically as needed for dry skin.   aspirin EC 81 MG tablet Take 81 mg by mouth daily. Swallow whole.   Cholecalciferol (VITAMIN D3) 250 MCG (10000 UT) capsule Take 4,000 Units by mouth daily.   etanercept (ENBREL) 50 MG/ML injection Inject 50 mg into the skin every Sunday.    ezetimibe (ZETIA) 10 MG tablet Take 1 tablet (10 mg total) by mouth daily.   HYDROcodone-acetaminophen (NORCO) 5-325 MG tablet Take 1 tablet by mouth 2 (two) times daily.   levothyroxine (SYNTHROID) 88 MCG tablet Take one tablet by mouth once daily 30 minutes before breakfast on an empty stomach for thyroid.   simvastatin (ZOCOR) 10 MG tablet TAKE 1 TABLET BY MOUTH EVERYDAY AT BEDTIME  Medications after current encounter Current Outpatient Medications  Medication Instructions   ammonium lactate (LAC-HYDRIN) 12 % lotion 1 application, Topical, As needed   amoxicillin (AMOXIL) 500 mg, Oral, As directed, Take 4 tablets by mouth 1 hour prior to dental work and cleanings   aspirin EC 81 mg, Oral, Daily, Swallow whole.   etanercept (ENBREL) 50 mg, Subcutaneous, Every Sun   ezetimibe (ZETIA) 10 mg, Oral, Daily   HYDROcodone-acetaminophen (NORCO) 5-325 MG tablet 1 tablet, Oral, 2 times daily   levothyroxine (SYNTHROID) 88 MCG tablet Take one tablet by mouth once daily 30 minutes before breakfast on an empty stomach for thyroid.   simvastatin (ZOCOR) 10 MG tablet TAKE 1 TABLET BY MOUTH EVERYDAY AT BEDTIME   Vitamin D3 4,000 Units, Oral, Daily    Radiology:   No results found.  Cardiac Studies:    Right and left heart catheterization 04/16/2021: Hemodynamic data: RA 2/1, mean 0 mmHg.  RV 24/-2, EDP 1 mmHg.  PA 24/6, mean 14 mmHg.  PW 9/4, mean 5 mmHg. PA saturation 78%, aortic saturation 100%.  Cardiac output by Fick was 5.16 with a cardiac index of 2.98. LV: 177/2, EDP 13 mmHg.  Aorta: 137/67, mean 95 mmHg. Peak to peak gradient 33 with a mean gradient of 29.4 mmHg.  Calculated aortic valve area 0.88 cm.  Angiographic data: Left main: Mildly calcified with mild luminal irregularity. CX: Large vessel, giving origin to small marginals and continues in the AV groove.  Proximal CX has calcified 60 to 70% stenosis.  OM 3 is the largest vessel with a proximal 80% calcific stenosis.  OM 1 and 2 are very small. LAD: Large-caliber vessel, mild diffuse disease, mild calcification is evident in the proximal and mid segment.  D1 and D2 are moderate sized without significant disease. RCA: Large-caliber vessel with mild diffuse disease and mild coronary calcification.  Carotid artery duplex 10/22/2021: Duplex suggests stenosis in the right internal carotid artery (50-69%). Duplex suggests stenosis in the left internal carotid artery (16-49%). Duplex suggests stenosis in the left external carotid artery (<50%). Antegrade right vertebral artery flow.  No significant change from 04/12/2021. Follow up in six months is appropriate if clinically indicated    Echocardiogram 09/04/21:   1. Left ventricular ejection fraction, by estimation, is 60 to 65%. The left ventricle has normal function. The left ventricle has no regional wall motion abnormalities. Left ventricular diastolic parameters are indeterminate.  2. Right ventricular systolic function is normal. The right ventricular size is normal.  3. The mitral valve is normal in structure. No evidence of mitral valve regurgitation. No evidence of mitral stenosis.  4. The aortic valve has been repaired/replaced. Aortic valve regurgitation is not  visualized. No aortic stenosis is present. There is a 23 mm Sapien prosthetic (TAVR) valve present in the aortic position. Echo findings are consistent with normal  structure and function of the aortic valve prosthesis. Aortic valve mean gradient measures 8.2 mmHg. Aortic valve Vmax measures 1.98 m/s.  5. The inferior vena cava is normal in size with greater than 50% respiratory variability, suggesting right atrial pressure of 3 mmHg  EKG:   EKG 10/30/2021: Normal sinus rhythm at rate of 77 bpm, left renal enlargement, normal axis.  Incomplete right bundle branch block.  Otherwise normal EKG. No significant change from 03/29/2021.    Assessment     ICD-10-CM   1. Dyspnea on exertion  R06.09     2. S/P TAVR (transcatheter aortic valve replacement)  23 mm Particia Lather  Valve 08/17/2021  Z95.2     3. Dizziness and giddiness  R42       Medications Discontinued During This Encounter  Medication Reason   clopidogrel (PLAVIX) 75 MG tablet     No orders of the defined types were placed in this encounter.   No orders of the defined types were placed in this encounter.  Recommendations:   Sydney Reid is a 81 y.o. Caucasian female patient with aortic stenosis, stage IIIa chronic kidney disease, hyperlipidemia, obstructive sleep apnea on CPAP, echocardiogram on 02/19/2021 revealing severe aortic stenosis. Underwent TAVR on 08/17/2021 with placement of a 23 mm Edwards CPN valve.    She has chronic dizziness but still very independent.  She has psoriatic arthritis with marked degenerative joint disease involving her hands but still able to cook and maintain all physical activities without limitations.  She has not had any fall.  I referred her to cardiac rehab but they had recommended physical therapy which I do not think is necessary, she is safe to perform cardiac rehab, hence a second referral will be sent.  I will see her back as previously scheduled in 6 months.  Her dyspnea on exertion  will certainly improve, this is due to diastolic dysfunction, cardiac rehab will help her to get back to her baseline but faster.  Today there is no clinical evidence of heart failure.  Adrian Prows, MD, [facility] 12/05/2021, 3:33 PM Office: [phone]

## 2021-12-09 ENCOUNTER — Encounter (HOSPITAL_COMMUNITY): Payer: Self-pay

## 2021-12-09 ENCOUNTER — Telehealth (HOSPITAL_COMMUNITY): Payer: Self-pay

## 2021-12-09 ENCOUNTER — Encounter: Payer: Self-pay | Admitting: Internal Medicine

## 2021-12-09 NOTE — Telephone Encounter (Signed)
Pt insurance is active and benefits verified through Medicare A/B. Co-pay $0.00, DED $226.00/$0.00 met, out of pocket $0.00/$0.00 met, co-insurance 20%. No pre-authorization required. Passport, 12/09/21 @ 11:19AM, TYV#[id number]   2ndary insurance is active and benefits verified through [employer]. Co-pay $0.00, DED $0.00/$0.00 met, out of pocket $0.00/$0.00 met, co-insurance 0%. No pre-authorization required. Passport, 12/09/21 @ 11:30AM, CHT#[id number]

## 2021-12-09 NOTE — Telephone Encounter (Signed)
Attempted to call patient in regards to Cardiac Rehab - LM on VM Mailed letter 

## 2021-12-09 NOTE — Telephone Encounter (Signed)
Pt returned CR phone call with update Medicare ID number and stated she interested in CR. Patient will come in for orientation on 01/02/22 @ 1:15PM and will attend the 10:30AM exercise class.   Tourist information centre manager.

## 2021-12-10 ENCOUNTER — Other Ambulatory Visit: Payer: Self-pay | Admitting: *Deleted

## 2021-12-10 DIAGNOSIS — L4052 Psoriatic arthritis mutilans: Secondary | ICD-10-CM

## 2021-12-10 DIAGNOSIS — G894 Chronic pain syndrome: Secondary | ICD-10-CM

## 2021-12-10 MED ORDER — HYDROCODONE-ACETAMINOPHEN 5-325 MG PO TABS: 1.0000 | ORAL_TABLET | Freq: Two times a day (BID) | ORAL | 0 refills | Status: DC

## 2021-12-10 NOTE — Telephone Encounter (Signed)
Cardiac rehab reinitiated

## 2021-12-10 NOTE — Telephone Encounter (Signed)
Patient requested refill.  Epic LR: 09/30/2021 Contract Date: 02/18/2021 Pended Rx and sent to [facility] for approval.

## 2021-12-13 ENCOUNTER — Telehealth: Payer: Self-pay

## 2021-12-13 NOTE — Telephone Encounter (Signed)
Patient requesting refill on Hydrocodone 5/325 mg. Patient states it was sent to [facility] but they don't have. She is requesting it be sent to [facility] on [address]. Opioid agreement states that she is to use [facility] for prescription.  Message routed to Sherrie Mustache, NP

## 2021-12-16 ENCOUNTER — Other Ambulatory Visit: Payer: Self-pay

## 2021-12-16 DIAGNOSIS — G894 Chronic pain syndrome: Secondary | ICD-10-CM

## 2021-12-16 DIAGNOSIS — L4052 Psoriatic arthritis mutilans: Secondary | ICD-10-CM

## 2021-12-16 MED ORDER — HYDROCODONE-ACETAMINOPHEN 5-325 MG PO TABS: 1.0000 | ORAL_TABLET | Freq: Two times a day (BID) | ORAL | 0 refills | Status: DC

## 2021-12-16 NOTE — Telephone Encounter (Signed)
Allergy alert came up. Medication pended and sent to Sherrie Mustache, NP for approval.

## 2021-12-16 NOTE — Telephone Encounter (Signed)
HYDROcodone-acetaminophen (NORCO) 5-325 MG tablet 60 tablet 0 12/16/2021    Sig - Route: Take 1 tablet by mouth 2 (two) times daily. - Oral   Sent to pharmacy as: HYDROcodone-acetaminophen ([place]) 5-325 MG tablet   Earliest Fill Date: 12/16/2021   E-Prescribing Status: Receipt confirmed by pharmacy (12/16/2021 11:51 AM EST)

## 2021-12-16 NOTE — Telephone Encounter (Signed)
Okay to send to [facility] ( will need to confirm pharmacy) [facility] call  [facility] prescription

## 2021-12-26 DIAGNOSIS — Z79899 Other long term (current) drug therapy: Secondary | ICD-10-CM | POA: Diagnosis not present

## 2021-12-26 DIAGNOSIS — M8580 Other specified disorders of bone density and structure, unspecified site: Secondary | ICD-10-CM | POA: Diagnosis not present

## 2021-12-26 DIAGNOSIS — L4050 Arthropathic psoriasis, unspecified: Secondary | ICD-10-CM | POA: Diagnosis not present

## 2021-12-26 DIAGNOSIS — M1990 Unspecified osteoarthritis, unspecified site: Secondary | ICD-10-CM | POA: Diagnosis not present

## 2021-12-26 DIAGNOSIS — M2550 Pain in unspecified joint: Secondary | ICD-10-CM | POA: Diagnosis not present

## 2021-12-26 DIAGNOSIS — H538 Other visual disturbances: Secondary | ICD-10-CM | POA: Diagnosis not present

## 2021-12-26 DIAGNOSIS — L409 Psoriasis, unspecified: Secondary | ICD-10-CM | POA: Diagnosis not present

## 2021-12-26 DIAGNOSIS — M5459 Other low back pain: Secondary | ICD-10-CM | POA: Diagnosis not present

## 2021-12-26 NOTE — Progress Notes (Signed)
[facility] OFFICE PROGRESS NOTE  [name], NP [address] [place] [address]  DIAGNOSIS: Polycythemia vera with positive JAK2 mutation diagnosed in April 2022  PRIOR THERAPY: None  CURRENT THERAPY: Phlebotomy on as-needed basis  INTERVAL HISTORY: Sydney Reid 81 y.o. female returns to the clinic today for a follow-up visit.  The patient is feeling fine today with no concerning complaints. She was last seen in the clinic on 05/16/21.  In the interval since last being seen, the patient underwent a TAVR on 08/17/2021 under the care of Dr. Donzetta Matters.   She denies headaches, flushing, abnormal bleeding or bruising, early satiety, or fatigue.  She sometimes has itching without rash.  She has not had a phlebotomy since July 2022.  She is here today for evaluation and repeat blood work and consideration of another therapeutic phlebotomy.   MEDICAL HISTORY: Past Medical History:  Diagnosis Date   Complication of anesthesia    Difficult intubation    20-25 years ago   H/O total knee replacement 10/28/1999   Right   High cholesterol    History of tonsillectomy and adenoidectomy 10/27/1944   History of transcatheter aortic valve replacement (TAVR) 08/06/2021   s/p Edwards 59mm S3 vis TF approach with Dr. Burt Knack and Dr. Cyndia Bent   Psoriatic arthritis [facility]) 10/27/1988   Sleep apnea with use of continuous positive airway pressure (CPAP) 10/27/2002   Thyroid disease     ALLERGIES:  is allergic to codeine and statins.  MEDICATIONS:  Current Outpatient Medications  Medication Sig Dispense Refill   ammonium lactate (LAC-HYDRIN) 12 % lotion Apply 1 application topically as needed for dry skin.     amoxicillin (AMOXIL) 500 MG tablet Take 1 tablet (500 mg total) by mouth as directed. Take 4 tablets by mouth 1 hour prior to dental work and cleanings (Patient not taking: Reported on 12/05/2021) 12 tablet 12   aspirin EC 81 MG tablet Take 81 mg by mouth daily. Swallow  whole.     Cholecalciferol (VITAMIN D3) 250 MCG (10000 UT) capsule Take 4,000 Units by mouth daily.     etanercept (ENBREL) 50 MG/ML injection Inject 50 mg into the skin every Sunday.      ezetimibe (ZETIA) 10 MG tablet Take 1 tablet (10 mg total) by mouth daily. 30 tablet 0   HYDROcodone-acetaminophen (NORCO) 5-325 MG tablet Take 1 tablet by mouth 2 (two) times daily. 60 tablet 0   levothyroxine (SYNTHROID) 88 MCG tablet Take one tablet by mouth once daily 30 minutes before breakfast on an empty stomach for thyroid. 90 tablet 3   simvastatin (ZOCOR) 10 MG tablet TAKE 1 TABLET BY MOUTH EVERYDAY AT BEDTIME 90 tablet 3   No current facility-administered medications for this visit.    SURGICAL HISTORY:  Past Surgical History:  Procedure Laterality Date   CATARACT EXTRACTION W/ INTRAOCULAR LENS IMPLANT Bilateral    FOOT SURGERY     fused arch in left foot   GROIN DISSECTION  08/06/2021   Procedure: GROIN EXPLORATION ;  Surgeon: Waynetta Sandy, MD;  Location: [facility];  Service: Vascular;;   INTRAOPERATIVE TRANSTHORACIC ECHOCARDIOGRAM N/A 08/06/2021   Procedure: INTRAOPERATIVE TRANSTHORACIC ECHOCARDIOGRAM;  Surgeon: Early Osmond, MD;  Location: [facility];  Service: Cardiovascular;  Laterality: N/A;   LOWER EXTREMITY ANGIOGRAM  08/06/2021   Procedure: LEFT LOWER EXTREMITY ANGIOGRAM;  Surgeon: Early Osmond, MD;  Location: [facility];  Service: Cardiovascular;;   PATCH ANGIOPLASTY  08/06/2021   Procedure: [facility]  ANGIOPLASTY OF FEMORAL ARTERY USING LEFT GREATER SAPHENOUS VEIN;  Surgeon: Waynetta Sandy, MD;  Location: [facility];  Service: Vascular;;   RIGHT/LEFT HEART CATH AND CORONARY ANGIOGRAPHY N/A 04/16/2021   Procedure: RIGHT/LEFT HEART CATH AND CORONARY ANGIOGRAPHY;  Surgeon: Adrian Prows, MD;  Location: [facility];  Service: Cardiovascular;  Laterality: N/A;   SPINE SURGERY     TONSILLECTOMY     TOTAL KNEE ARTHROPLASTY Left    TRANSCATHETER AORTIC VALVE REPLACEMENT,  TRANSFEMORAL N/A 08/06/2021   Procedure: TRANSCATHETER AORTIC VALVE REPLACEMENT, TRANSFEMORAL;  Surgeon: Early Osmond, MD;  Location: [facility];  Service: Cardiovascular;  Laterality: N/A;    REVIEW OF SYSTEMS:   Review of Systems  Constitutional: Negative for appetite change, chills, fatigue, fever and unexpected weight change.  HENT: Negative for mouth sores, nosebleeds, sore throat and trouble swallowing.   Eyes: Negative for eye problems and icterus.  Respiratory: Negative for cough, hemoptysis, shortness of breath and wheezing.   Cardiovascular: Negative for chest pain and leg swelling.  Gastrointestinal: Negative for abdominal pain, constipation, diarrhea, nausea and vomiting.  Genitourinary: Negative for bladder incontinence, difficulty urinating, dysuria, frequency and hematuria.   Musculoskeletal: Negative for back pain, gait problem, neck pain and neck stiffness.  Skin: Positive for occasional itching without rash.  Negative for rash.  Neurological: Negative for dizziness, extremity weakness, gait problem, headaches, light-headedness and seizures.  Hematological: Negative for adenopathy. Does not bruise/bleed easily.  Psychiatric/Behavioral: Negative for confusion, depression and sleep disturbance. The patient is not nervous/anxious.     PHYSICAL EXAMINATION:  There were no vitals taken for this visit.  ECOG PERFORMANCE STATUS: 1  Physical Exam  Constitutional: Oriented to person, place, and time and well-developed, well-nourished, and in no distress.  HENT:  Head: Normocephalic and atraumatic.  Mouth/Throat: Oropharynx is clear and moist. No oropharyngeal exudate.  Eyes: Conjunctivae are normal. Right eye exhibits no discharge. Left eye exhibits no discharge. No scleral icterus.  Neck: Normal range of motion. Neck supple.  Cardiovascular: Normal rate, regular rhythm, normal heart sounds and intact distal pulses.   Pulmonary/Chest: Effort normal and breath sounds normal. No  respiratory distress. No wheezes. No rales.  Abdominal: Soft. Bowel sounds are normal. Exhibits no distension and no mass. There is no tenderness.  Musculoskeletal: Normal range of motion. Exhibits no edema.  Lymphadenopathy:    No cervical adenopathy.  Neurological: Alert and oriented to person, place, and time. Exhibits normal muscle tone. Gait normal. Coordination normal.  Skin: Skin is warm and dry. No rash noted. Not diaphoretic. No erythema. No pallor.  Psychiatric: Mood, memory and judgment normal.  Vitals reviewed.  LABORATORY DATA: Lab Results  Component Value Date   WBC 7.3 12/30/2021   HGB 15.1 (H) 12/30/2021   HCT 48.9 (H) 12/30/2021   MCV 82.7 12/30/2021   PLT 197 12/30/2021      Chemistry      Component Value Date/Time   NA 137 08/07/2021 0143   NA 142 06/11/2021 1035   K 4.3 08/07/2021 0143   CL 109 08/07/2021 0143   CO2 20 (L) 08/07/2021 0143   BUN 10 08/07/2021 0143   BUN 15 06/11/2021 1035   CREATININE 0.85 08/07/2021 0143   CREATININE 1.05 (H) 03/14/2021 1141   CREATININE 1.21 (H) 03/06/2021 0856   GLU 93 04/02/2016 0000      Component Value Date/Time   CALCIUM 8.6 (L) 08/07/2021 0143   ALKPHOS 59 08/02/2021 1441   AST 29 08/02/2021 1441   AST 21  03/14/2021 1141   ALT 23 08/02/2021 1441   ALT 13 03/14/2021 1141   BILITOT 0.9 08/02/2021 1441   BILITOT 0.5 03/14/2021 1141       RADIOGRAPHIC STUDIES:  No results found.   ASSESSMENT/PLAN:  This is a very pleasant 81 years old Caucasian female diagnosed with polycythemia vera with positive JAK2 mutation.  The patient is currently undergoing therapeutic phlebotomies in order to keep her hematocrit around 45%.   The patient was seen with Dr. Julien Nordmann. Labs were reviewed. Her hematocrit is 48.9 today.  Her hemoglobin is 15.1.  Dr. Julien Nordmann recommends that she proceed with a therapeutic phlebotomy today as scheduled.  She has done well without needed phlebotomy since this time. We will see her back  for a follow up visit in 6 months for evaluation and repeat blood work and consideration of phlebotomy if needed.   The patient was advised to call immediately if she has any concerning symptoms in the interval. The patient voices understanding of current disease status and treatment options and is in agreement with the current care plan. All questions were answered. The patient knows to call the clinic with any problems, questions or concerns. We can certainly see the patient much sooner if necessary     No orders of the defined types were placed in this encounter.     Irene Collings L Callia Swim, PA-C 12/26/21  ADDENDUM: Hematology/Oncology Attending: I had a face-to-face encounter with the patient today.  I reviewed her record, lab and recommended her care plan.  This is a very pleasant 81 years old white female with polycythemia vera with positive JAK2 mutation and currently on phlebotomy on as-needed basis.  The patient was lost to follow-up for almost 1 year.  She underwent TAVR in October 2022. The patient is here today for evaluation and repeat blood work.  Her hematocrit is elevated at 48.9%. I recommended for the patient to proceed with phlebotomy in the next few days. We will see her back for follow-up visit in 6 months with repeat CBC, comprehensive metabolic panel and LDH. She was advised to call immediately if she has any other concerning symptoms in the interval. Disclaimer: This note was dictated with voice recognition software. Similar sounding words can inadvertently be transcribed and may be missed upon review. Eilleen Kempf, MD

## 2021-12-30 ENCOUNTER — Other Ambulatory Visit: Payer: Self-pay

## 2021-12-30 ENCOUNTER — Inpatient Hospital Stay: Payer: Medicare Other | Attending: Physician Assistant

## 2021-12-30 ENCOUNTER — Inpatient Hospital Stay (HOSPITAL_BASED_OUTPATIENT_CLINIC_OR_DEPARTMENT_OTHER): Payer: Medicare Other | Admitting: Physician Assistant

## 2021-12-30 ENCOUNTER — Inpatient Hospital Stay: Payer: Medicare Other

## 2021-12-30 ENCOUNTER — Encounter: Payer: Self-pay | Admitting: Physician Assistant

## 2021-12-30 VITALS — BP 119/88 | HR 103 | Temp 98.1°F | Resp 18 | Wt 146.1 lb

## 2021-12-30 VITALS — BP 146/84 | HR 83 | Temp 97.7°F | Resp 18

## 2021-12-30 DIAGNOSIS — D45 Polycythemia vera: Secondary | ICD-10-CM | POA: Insufficient documentation

## 2021-12-30 DIAGNOSIS — Z79899 Other long term (current) drug therapy: Secondary | ICD-10-CM | POA: Diagnosis not present

## 2021-12-30 DIAGNOSIS — L4050 Arthropathic psoriasis, unspecified: Secondary | ICD-10-CM | POA: Diagnosis not present

## 2021-12-30 DIAGNOSIS — Z888 Allergy status to other drugs, medicaments and biological substances status: Secondary | ICD-10-CM | POA: Insufficient documentation

## 2021-12-30 DIAGNOSIS — E079 Disorder of thyroid, unspecified: Secondary | ICD-10-CM | POA: Insufficient documentation

## 2021-12-30 DIAGNOSIS — L299 Pruritus, unspecified: Secondary | ICD-10-CM | POA: Diagnosis not present

## 2021-12-30 DIAGNOSIS — Z885 Allergy status to narcotic agent status: Secondary | ICD-10-CM | POA: Insufficient documentation

## 2021-12-30 LAB — CBC WITH DIFFERENTIAL (CANCER CENTER ONLY)
Abs Immature Granulocytes: 0.01 10*3/uL (ref 0.00–0.07)
Basophils Absolute: 0 10*3/uL (ref 0.0–0.1)
Basophils Relative: 0 %
Eosinophils Absolute: 0 10*3/uL (ref 0.0–0.5)
Eosinophils Relative: 0 %
HCT: 48.9 % — ABNORMAL HIGH (ref 36.0–46.0)
Hemoglobin: 15.1 g/dL — ABNORMAL HIGH (ref 12.0–15.0)
Immature Granulocytes: 0 %
Lymphocytes Relative: 28 %
Lymphs Abs: 2.1 10*3/uL (ref 0.7–4.0)
MCH: 25.5 pg — ABNORMAL LOW (ref 26.0–34.0)
MCHC: 30.9 g/dL (ref 30.0–36.0)
MCV: 82.7 fL (ref 80.0–100.0)
Monocytes Absolute: 0.8 10*3/uL (ref 0.1–1.0)
Monocytes Relative: 10 %
Neutro Abs: 4.4 10*3/uL (ref 1.7–7.7)
Neutrophils Relative %: 62 %
Platelet Count: 197 10*3/uL (ref 150–400)
RBC: 5.91 MIL/uL — ABNORMAL HIGH (ref 3.87–5.11)
RDW: 14.8 % (ref 11.5–15.5)
WBC Count: 7.3 10*3/uL (ref 4.0–10.5)
nRBC: 0 % (ref 0.0–0.2)

## 2021-12-30 LAB — CMP (CANCER CENTER ONLY)
ALT: 15 U/L (ref 0–44)
AST: 20 U/L (ref 15–41)
Albumin: 4.3 g/dL (ref 3.5–5.0)
Alkaline Phosphatase: 69 U/L (ref 38–126)
Anion gap: 7 (ref 5–15)
BUN: 15 mg/dL (ref 8–23)
CO2: 27 mmol/L (ref 22–32)
Calcium: 9.9 mg/dL (ref 8.9–10.3)
Chloride: 106 mmol/L (ref 98–111)
Creatinine: 1.1 mg/dL — ABNORMAL HIGH (ref 0.44–1.00)
GFR, Estimated: 50 mL/min — ABNORMAL LOW (ref >60)
Glucose, Bld: 90 mg/dL (ref 70–99)
Potassium: 4.1 mmol/L (ref 3.5–5.1)
Sodium: 140 mmol/L (ref 135–145)
Total Bilirubin: 0.5 mg/dL (ref 0.3–1.2)
Total Protein: 8.2 g/dL — ABNORMAL HIGH (ref 6.5–8.1)

## 2021-12-30 NOTE — Progress Notes (Signed)
BP (!) 146/84 (BP Location: Right Arm, Patient Position: Sitting)   Pulse 83   Temp 97.7 ?F (36.5 ?C) (Oral)   Resp 18   SpO2 100%  ?Phlebotomy procedure ordered- ?Patient arrived feeling well with her mild baseline dizziness. 18g needle inserted in her L AC. VSS. Procedure started at 1239 and finished at 1310. 499g of blood removed for a hematocrit of 48.9 (greater than 45% per order). IV removed with tip intact. Food and drink offered and patient stayed for 15 minutes instead of 30 minutes per her preference. Above VS and her stating that she felt appropriate to leave- Husband to drive her home. Overall, procedure tolerated well. ? ?

## 2021-12-30 NOTE — Patient Instructions (Signed)

## 2022-01-01 ENCOUNTER — Telehealth (HOSPITAL_COMMUNITY): Payer: Self-pay | Admitting: *Deleted

## 2022-01-01 NOTE — Telephone Encounter (Signed)
Spoke with the patient. Confirmed appointment for tomorrow's orientation. Will complete health history in person.Barnet Pall, RN,BSN ?01/01/2022 4:26 PM  ?

## 2022-01-02 ENCOUNTER — Other Ambulatory Visit: Payer: Self-pay

## 2022-01-02 ENCOUNTER — Encounter (HOSPITAL_COMMUNITY): Payer: Self-pay

## 2022-01-02 ENCOUNTER — Encounter (HOSPITAL_COMMUNITY)
Admission: RE | Admit: 2022-01-02 | Discharge: 2022-01-02 | Disposition: A | Discharge status: Home / Self Care | Payer: Medicare Other | Source: Ambulatory Visit | Attending: Cardiology | Admitting: Cardiology

## 2022-01-02 VITALS — BP 106/70 | HR 86 | Ht 63.0 in | Wt 145.3 lb

## 2022-01-02 DIAGNOSIS — Z952 Presence of prosthetic heart valve: Secondary | ICD-10-CM

## 2022-01-02 NOTE — Progress Notes (Signed)
Cardiac Individual Treatment Plan  Patient Details  Name: Sydney Reid MRN: [id number] Date of Birth: 1940-12-04 Referring Provider:   Flowsheet Row CARDIAC REHAB PHASE II ORIENTATION from 01/02/2022 in [place]  Referring Provider Dr. Adrian Prows MD       Initial Encounter Date:  [place] from 01/02/2022 in [place]  Date 01/02/22       Visit Diagnosis: 08/06/21 S/P TAVR  Patient's Home Medications on Admission:  Current Outpatient Medications:    ammonium lactate (LAC-HYDRIN) 12 % lotion, Apply 1 application topically as needed for dry skin., Disp: , Rfl:    amoxicillin (AMOXIL) 500 MG tablet, Take 1 tablet (500 mg total) by mouth as directed. Take 4 tablets by mouth 1 hour prior to dental work and cleanings (Patient not taking: Reported on 12/05/2021), Disp: 12 tablet, Rfl: 12   aspirin EC 81 MG tablet, Take 81 mg by mouth daily. Swallow whole., Disp: , Rfl:    Cholecalciferol (VITAMIN D3) 250 MCG (10000 UT) capsule, Take 4,000 Units by mouth daily., Disp: , Rfl:    etanercept (ENBREL) 50 MG/ML injection, Inject 50 mg into the skin every Sunday. , Disp: , Rfl:    ezetimibe (ZETIA) 10 MG tablet, Take 1 tablet (10 mg total) by mouth daily., Disp: 30 tablet, Rfl: 0   HYDROcodone-acetaminophen (NORCO) 5-325 MG tablet, Take 1 tablet by mouth 2 (two) times daily., Disp: 60 tablet, Rfl: 0   levothyroxine (SYNTHROID) 88 MCG tablet, Take one tablet by mouth once daily 30 minutes before breakfast on an empty stomach for thyroid., Disp: 90 tablet, Rfl: 3   simvastatin (ZOCOR) 10 MG tablet, TAKE 1 TABLET BY MOUTH EVERYDAY AT BEDTIME, Disp: 90 tablet, Rfl: 3  Past Medical History: Past Medical History:  Diagnosis Date   Complication of anesthesia    Difficult intubation    20-25 years ago   H/O total knee replacement 10/28/1999   Right   High cholesterol    History of tonsillectomy  and adenoidectomy 10/27/1944   History of transcatheter aortic valve replacement (TAVR) 08/06/2021   s/p Edwards 73mm S3 vis TF approach with Dr. Burt Knack and Dr. Cyndia Bent   Psoriatic arthritis [facility]) 10/27/1988   Sleep apnea with use of continuous positive airway pressure (CPAP) 10/27/2002   Thyroid disease     Tobacco Use: Social History   Tobacco Use  Smoking Status Former   Packs/day: 2.00   Years: 18.00   Pack years: 36.00   Types: Cigarettes   Quit date: 1975   Years since quitting: 48.2  Smokeless Tobacco Never    Labs: Recent Review Scientist, physiological     Labs for ITP Cardiac and Pulmonary Rehab Latest Ref Rng & Units 04/16/2021 04/16/2021 06/06/2021 08/02/2021 08/06/2021   Cholestrol 100 - 199 mg/dL - - 155 - -   LDLCALC 0 - 99 mg/dL - - 87 - -   HDL >39 mg/dL - - 48 - -   Trlycerides 0 - 149 mg/dL - - 107 - -   PHART 7.350 - 7.450 - 7.347(L) - 7.398 -   PCO2ART 32.0 - 48.0 mmHg - 43.0 - 35.4 -   HCO3 20.0 - 28.0 mmol/L 23.2 23.6 - 21.4 -   TCO2 22 - 32 mmol/L 25 25 - - 25   ACIDBASEDEF 0.0 - 2.0 mmol/L 3.0(H) 2.0 - 2.7(H) -   O2SAT % 78.0 100.0 - 98.3 -  Capillary Blood Glucose: No results found for: GLUCAP   Exercise Target Goals: Exercise Program Goal: Individual exercise prescription set using results from initial 6 min walk test and THRR while considering  patients activity barriers and safety.   Exercise Prescription Goal: Starting with aerobic activity 30 plus minutes a day, 3 days per week for initial exercise prescription. Provide home exercise prescription and guidelines that participant acknowledges understanding prior to discharge.  Activity Barriers & Risk Stratification:  Activity Barriers & Cardiac Risk Stratification - 01/02/22 1454       Activity Barriers & Cardiac Risk Stratification   Activity Barriers Neck/Spine Problems;Arthritis;Back Problems;Joint Problems;Balance Concerns;Deconditioning;History of Falls;Right Knee Replacement;Shortness  of Breath    Cardiac Risk Stratification High             6 Minute Walk:  6 Minute Walk     Row Name 01/02/22 1450         6 Minute Walk   Phase Initial     Distance 600 feet     Walk Time 6 minutes     # of Rest Breaks 2  2.20-2-55 and 5.46-6.00 due to high HR     MPH 1.14     METS 1.54     RPE 13     Perceived Dyspnea  2     VO2 Peak 5.4     Symptoms Yes (comment)     Comments 5/10 chronic back pain, relieved with rest. 2 breaks due to elevated HR     Resting HR 68 bpm     Resting BP 106/70     Resting Oxygen Saturation  99 %     Exercise Oxygen Saturation  during 6 min walk 99 %     Max Ex. HR 133 bpm     Max Ex. BP 144/70     2 Minute Post BP 106/68              Oxygen Initial Assessment:   Oxygen Re-Evaluation:   Oxygen Discharge (Final Oxygen Re-Evaluation):   Initial Exercise Prescription:  Initial Exercise Prescription - 01/02/22 1400       Date of Initial Exercise RX and Referring Provider   Date 01/02/22    Referring Provider Dr. Adrian Prows MD    Expected Discharge Date 03/07/22      NuStep   Level 1    SPM 60    METs 1      Prescription Details   Frequency (times per week) 3    Duration Progress to 30 minutes of continuous aerobic without signs/symptoms of physical distress      Intensity   THRR 40-80% of Max Heartrate 56-111    Ratings of Perceived Exertion 11-13    Perceived Dyspnea 0-4      Progression   Progression Continue progressive overload as per policy without signs/symptoms or physical distress.      Resistance Training   Training Prescription No   Pt unable to grip. Will seek out assistive wts            Perform Capillary Blood Glucose checks as needed.  Exercise Prescription Changes:   Exercise Comments:   Exercise Goals and Review:   Exercise Goals     Row Name 01/02/22 1459             Exercise Goals   Increase Physical Activity Yes       Intervention Provide advice, education, support  and counseling about physical activity/exercise needs.;Develop an individualized exercise  prescription for aerobic and resistive training based on initial evaluation findings, risk stratification, comorbidities and participant's personal goals.       Expected Outcomes Short Term: Attend rehab on a regular basis to increase amount of physical activity.;Long Term: Add in home exercise to make exercise part of routine and to increase amount of physical activity.;Long Term: Exercising regularly at least 3-5 days a week.       Increase Strength and Stamina Yes       Intervention Provide advice, education, support and counseling about physical activity/exercise needs.;Develop an individualized exercise prescription for aerobic and resistive training based on initial evaluation findings, risk stratification, comorbidities and participant's personal goals.       Expected Outcomes Short Term: Increase workloads from initial exercise prescription for resistance, speed, and METs.;Short Term: Perform resistance training exercises routinely during rehab and add in resistance training at home;Long Term: Improve cardiorespiratory fitness, muscular endurance and strength as measured by increased METs and functional capacity (6MWT)       Able to understand and use rate of perceived exertion (RPE) scale Yes       Intervention Provide education and explanation on how to use RPE scale       Expected Outcomes Short Term: Able to use RPE daily in rehab to express subjective intensity level;Long Term:  Able to use RPE to guide intensity level when exercising independently       Able to understand and use Dyspnea scale Yes       Intervention Provide education and explanation on how to use Dyspnea scale       Expected Outcomes Short Term: Able to use Dyspnea scale daily in rehab to express subjective sense of shortness of breath during exertion;Long Term: Able to use Dyspnea scale to guide intensity level when exercising  independently       Knowledge and understanding of Target Heart Rate Range (THRR) Yes       Intervention Provide education and explanation of THRR including how the numbers were predicted and where they are located for reference       Expected Outcomes Short Term: Able to state/look up THRR;Long Term: Able to use THRR to govern intensity when exercising independently;Short Term: Able to use daily as guideline for intensity in rehab       Understanding of Exercise Prescription Yes       Intervention Provide education, explanation, and written materials on patient's individual exercise prescription       Expected Outcomes Short Term: Able to explain program exercise prescription;Long Term: Able to explain home exercise prescription to exercise independently                Exercise Goals Re-Evaluation :    Discharge Exercise Prescription (Final Exercise Prescription Changes):   Nutrition:  Target Goals: Understanding of nutrition guidelines, daily intake of sodium '1500mg'$ , cholesterol '200mg'$ , calories 30% from fat and 7% or less from saturated fats, daily to have 5 or more servings of fruits and vegetables.  Biometrics:  Pre Biometrics - 01/02/22 1352       Pre Biometrics   Waist Circumference 40.5 inches    Hip Circumference 40.25 inches    Waist to Hip Ratio 1.01 %    Triceps Skinfold 15 mm    % Body Fat 37.9 %    Grip Strength --   Pt unable to grip   Flexibility 5 in    Single Leg Stand 4.7 seconds  Nutrition Therapy Plan and Nutrition Goals:   Nutrition Assessments:  MEDIFICTS Score Key: ?70 Need to make dietary changes  40-70 Heart Healthy Diet ? 40 Therapeutic Level Cholesterol Diet   Picture Your Plate Scores: <96 Unhealthy dietary pattern with much room for improvement. 41-50 Dietary pattern unlikely to meet recommendations for good health and room for improvement. 51-60 More healthful dietary pattern, with some room for improvement.  >60  Healthy dietary pattern, although there may be some specific behaviors that could be improved.    Nutrition Goals Re-Evaluation:   Nutrition Goals Discharge (Final Nutrition Goals Re-Evaluation):   Psychosocial: Target Goals: Acknowledge presence or absence of significant depression and/or stress, maximize coping skills, provide positive support system. Participant is able to verbalize types and ability to use techniques and skills needed for reducing stress and depression.  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 Yes   [name] has her husband and frieds who live in the area for support     Barriers   Psychosocial barriers to participate in program The patient should benefit from training in stress management and relaxation.      Screening Interventions   Interventions Encouraged to exercise    Expected Outcomes Long Term Goal: Stressors or current issues are controlled or eliminated.             Quality of Life Scores:  Quality of Life - 01/02/22 1502       Quality of Life   Select Quality of Life      Quality of Life Scores   Health/Function Pre 25 %    Socioeconomic Pre 26.56 %    Psych/Spiritual Pre 27.86 %    Family Pre 26.6 %    GLOBAL Pre 26.16 %            Scores of 19 and below usually indicate a poorer quality of life in these areas.  A difference of  2-3 points is a clinically meaningful difference.  A difference of 2-3 points in the total score of the Quality of Life Index has been associated with significant improvement in overall quality of life, self-image, physical symptoms, and general health in studies assessing change in quality of life.  PHQ-9: Recent Review Flowsheet Data     Depression screen  [facility] 2/9 01/02/2022 08/01/2021 05/15/2020 10/17/2019 05/16/2019   Decreased Interest 0 0 0 0 0   Down, Depressed, Hopeless 0 0 0 0 0   PHQ - 2 Score 0 0 0 0 0      Interpretation of Total Score  Total Score Depression Severity:  1-4 = Minimal depression, 5-9 = Mild depression, 10-14 = Moderate depression, 15-19 = Moderately severe depression, 20-27 = Severe depression   Psychosocial Evaluation and Intervention:   Psychosocial Re-Evaluation:   Psychosocial Discharge (Final Psychosocial Re-Evaluation):   Vocational Rehabilitation: Provide vocational rehab assistance to qualifying candidates.   Vocational Rehab Evaluation & Intervention:  Vocational Rehab - 01/02/22 1357       Initial Vocational Rehab Evaluation & Intervention   Assessment shows need for Vocational Rehabilitation No   [name] is retired and does not need vocational rehab at this time  Education: Education Goals: Education classes will be provided on a weekly basis, covering required topics. Participant will state understanding/return demonstration of topics presented.  Learning Barriers/Preferences:  Learning Barriers/Preferences - 01/02/22 1507       Learning Barriers/Preferences   Learning Barriers Sight;Exercise Concerns   pt wears glasses, pt has dizziness daily, pt is SOB, pt has balance issues   Learning Preferences Audio;Group Instruction;Individual Instruction;Skilled Demonstration;Verbal Instruction             Education Topics: Hypertension, Hypertension Reduction -Define heart disease and high blood pressure. Discus how high blood pressure affects the body and ways to reduce high blood pressure.   Exercise and Your Heart -Discuss why it is important to exercise, the FITT principles of exercise, normal and abnormal responses to exercise, and how to exercise safely.   Angina -Discuss definition of angina, causes of angina, treatment of angina, and how to decrease risk of having  angina.   Cardiac Medications -Review what the following cardiac medications are used for, how they affect the body, and side effects that may occur when taking the medications.  Medications include Aspirin, Beta blockers, calcium channel blockers, ACE Inhibitors, angiotensin receptor blockers, diuretics, digoxin, and antihyperlipidemics.   Congestive Heart Failure -Discuss the definition of CHF, how to live with CHF, the signs and symptoms of CHF, and how keep track of weight and sodium intake.   Heart Disease and Intimacy -Discus the effect sexual activity has on the heart, how changes occur during intimacy as we age, and safety during sexual activity.   Smoking Cessation / COPD -Discuss different methods to quit smoking, the health benefits of quitting smoking, and the definition of COPD.   Nutrition I: Fats -Discuss the types of cholesterol, what cholesterol does to the heart, and how cholesterol levels can be controlled.   Nutrition II: Labels -Discuss the different components of food labels and how to read food label   Heart Parts/Heart Disease and PAD -Discuss the anatomy of the heart, the pathway of blood circulation through the heart, and these are affected by heart disease.   Stress I: Signs and Symptoms -Discuss the causes of stress, how stress may lead to anxiety and depression, and ways to limit stress.   Stress II: Relaxation -Discuss different types of relaxation techniques to limit stress.   Warning Signs of Stroke / TIA -Discuss definition of a stroke, what the signs and symptoms are of a stroke, and how to identify when someone is having stroke.   Knowledge Questionnaire Score:  Knowledge Questionnaire Score - 01/02/22 1507       Knowledge Questionnaire Score   Pre Score 22/24             Core Components/Risk Factors/Patient Goals at Admission:  Personal Goals and Risk Factors at Admission - 01/02/22 1509       Core Components/Risk  Factors/Patient Goals on Admission    Weight Management Yes;Weight Loss    Intervention Weight Management: Develop a combined nutrition and exercise program designed to reach desired caloric intake, while maintaining appropriate intake of nutrient and fiber, sodium and fats, and appropriate energy expenditure required for the weight goal.;Weight Management: Provide education and appropriate resources to help participant work on and attain dietary goals.    Admit Weight 145 lb 4.5 oz (65.9 kg)    Expected Outcomes Short Term: Continue to assess and modify interventions until short term weight is achieved;Long Term: Adherence to nutrition and physical activity/exercise program aimed toward attainment of established weight  goal;Weight Loss: Understanding of general recommendations for a balanced deficit meal plan, which promotes 1-2 lb weight loss per week and includes a negative energy balance of [phone] kcal/d;Understanding recommendations for meals to include 15-35% energy as protein, 25-35% energy from fat, 35-60% energy from carbohydrates, less than '200mg'$  of dietary cholesterol, 20-35 gm of total fiber daily;Understanding of distribution of calorie intake throughout the day with the consumption of 4-5 meals/snacks    Lipids Yes    Intervention Provide education and support for participant on nutrition & aerobic/resistive exercise along with prescribed medications to achieve LDL '70mg'$ , HDL >'40mg'$ .    Expected Outcomes Short Term: Participant states understanding of desired cholesterol values and is compliant with medications prescribed. Participant is following exercise prescription and nutrition guidelines.;Long Term: Cholesterol controlled with medications as prescribed, with individualized exercise RX and with personalized nutrition plan. Value goals: LDL < '70mg'$ , HDL > 40 mg.    Personal Goal Other Yes    Personal Goal Short and long the same: control SOB and be able to dance without SOB     Intervention Will continue to monitor pt and progress workloads as tolerated without sign or symptom    Expected Outcomes Pt will achieve her goals with time and gradual progression             Core Components/Risk Factors/Patient Goals Review:    Core Components/Risk Factors/Patient Goals at Discharge (Final Review):    ITP Comments:  ITP Comments     Row Name 01/02/22 1355           ITP Comments Dr Fransico Him MD, Medical Director                Comments: [name] attended orientation on 01/02/2022 to review rules and guidelines for program.  Completed 6 minute walk test, Intitial ITP, and exercise prescription.  VSS. Telemetry-Sinus Rhythm. Patient had to stop twice due to tachycardia reported shortness of breath  and chronic back painsee previous documentation . Safety measures and social distancing in place per CDC guidelines. Harrell Gave RN BSN

## 2022-01-02 NOTE — Progress Notes (Signed)
Sydney Reid is  here for cardiac rehab orientation. Telemetry rhythm Sinus rhythm. Heart rate noted to go up to the 120's just walking from one part of the gym to another. Resting heart noted in the mid 80's to upper 90's. Patient's target heart rate is 111 for her age. Proceeded with 6 minute walk test. Had to stop the patient as her heart rate went up the 140's to 150's twice from 2:20-2:55- 5:46-6:00.  Sydney Reid reported having level 2 dyspnea and chronic lower back pain  Sydney Reid's heart rate went back to the low 100's after rest some occasional PVC's, PAC's and a couplet were noted. Dr Irven Shelling office called and notified. Spoke with Magda Paganini. Will fax exercise flow sheets to Dr. Irven Shelling office for review. Sydney Reid had no complaints upon exit from phase 2 cardiac rehab. Exit heart rate blood pressure 106/68. Heart rate 94. Will check with Dr Nadyne Coombes about acceptable target heart rate and proceeding with exercise next week.Barnet Pall, RN,BSN ?01/02/2022 3:25 PM  ?

## 2022-01-02 NOTE — Progress Notes (Signed)
Cardiac Rehab Medication Review by a Nurse ? ?Does the patient  feel that his/her medications are working for him/her?  yes ? ?Has the patient been experiencing any side effects to the medications prescribed?  no ? ?Does the patient measure his/her own blood pressure or blood glucose at home?  yes  ? ?Does the patient have any problems obtaining medications due to transportation or finances?   no ? ?Understanding of regimen: good ?Understanding of indications: good ?Potential of compliance: good ? ? ? ?Nurse comments: [name] is taking her medications as prescribed and has a good understanding of what her medications are for. [name] has a BP cuff monitor but does not check her blood pressures on a regular basis ? ? ? ?Harrell Gave RN ?01/02/2022 2:02 PM ?  ?

## 2022-01-06 ENCOUNTER — Other Ambulatory Visit: Payer: Self-pay

## 2022-01-06 ENCOUNTER — Ambulatory Visit (HOSPITAL_COMMUNITY): Payer: Medicare Other

## 2022-01-06 ENCOUNTER — Encounter (HOSPITAL_COMMUNITY)
Admission: RE | Admit: 2022-01-06 | Discharge: 2022-01-06 | Disposition: A | Discharge status: Home / Self Care | Payer: Medicare Other | Source: Ambulatory Visit | Attending: Cardiology | Admitting: Cardiology

## 2022-01-06 DIAGNOSIS — Z1231 Encounter for screening mammogram for malignant neoplasm of breast: Secondary | ICD-10-CM | POA: Diagnosis not present

## 2022-01-06 DIAGNOSIS — Z952 Presence of prosthetic heart valve: Secondary | ICD-10-CM

## 2022-01-06 LAB — HM MAMMOGRAPHY

## 2022-01-06 NOTE — Progress Notes (Signed)
Daily Session Note ? ?Patient Details  ?Name: Sydney Reid ?MRN: [id number] ?Date of Birth: 1941/07/09 ?Referring Provider:   ?Flowsheet Row CARDIAC REHAB PHASE II ORIENTATION from 01/02/2022 in [place]  ?Referring Provider Dr. Adrian Prows MD  ? ?  ? ? ?Encounter Date: 01/06/2022 ? ?Check In: ? Session Check In - 01/06/22 1453   ? ?  ? Check-In  ? Supervising physician immediately available to respond to emergencies [facility] immediately available   ? Physician(s) Dr Alfredia Ferguson   ? Location [facility]   ? Staff Present Esmeralda Links BS, ACSM EP-C, Exercise Physiologist;Zakry Caso, RN, Deland Pretty, MS, ACSM CEP, Exercise Physiologist;David Makemson, MS, ACSM-CEP, CCRP, Exercise Physiologist   ? Virtual Visit No   ? Medication changes reported     No   ? Fall or balance concerns reported    No   ? Tobacco Cessation No Change   ? Warm-up and Cool-down Performed as group-led instruction   ? Resistance Training Performed Yes   ? VAD Patient? No   ? PAD/SET Patient? No   ?  ? Pain Assessment  ? Currently in Pain? No/denies   ? Pain Score 0-No pain   ? Multiple Pain Sites No   ? ?  ?  ? ?  ? ? ?Capillary Blood Glucose: ?No results found for this or any previous visit (from the past 24 hour(s)). ? ? Exercise Prescription Changes - 01/06/22 1615   ? ?  ? Response to Exercise  ? Blood Pressure (Admit) 128/72   ? Blood Pressure (Exercise) 152/78   ? Blood Pressure (Exit) 102/60   ? Heart Rate (Admit) 100 bpm   ? Heart Rate (Exercise) 127 bpm   ? Heart Rate (Exit) 96 bpm   ? Rating of Perceived Exertion (Exercise) 9   ? Perceived Dyspnea (Exercise) 0   ? Symptoms HR exceeded THRR during warmup and stretches, will have pt seated going farward. Some occasional dizziness   ? Comments Pt first day of exercise in the crp2 program   ? Duration Progress to 30 minutes of  aerobic without signs/symptoms of physical distress   ? Intensity THRR New   ?  ? Progression  ?  Progression Continue to progress workloads to maintain intensity without signs/symptoms of physical distress.   ? Average METs 3.5   ?  ? Resistance Training  ? Training Prescription Yes   Pt unable to grip. Acquired adaptive wts  ? Weight 1   ? Reps 10-15   ? Time 10 Minutes   ?  ? NuStep  ? Level 2   ? SPM 60   ? METs 3.5   ? ?  ?  ? ?  ? ? ?Social History  ? ?Tobacco Use  ?Smoking Status Former  ? Packs/day: 2.00  ? Years: 18.00  ? Pack years: 36.00  ? Types: Cigarettes  ? Quit date: 25  ? Years since quitting: 48.2  ?Smokeless Tobacco Never  ? ? ?Goals Met:  ?Exercise tolerated well ?No report of concerns or symptoms today ?Strength training completed today ? ?Goals Unmet:  ?Not Applicable ? ?Comments: Sydney Reid started cardiac rehab today.  Pt tolerated light exercise without difficulty. VSS, telemetry-Sinus Rhythm Sinus Tach. Dr Belva Chimes reported feeling a little lightheaded this afternoon. Orthostatic BP's checked WNL. Sydney Reid was given water and reported feeling better. Proceeded to exercise. Medication list reconciled. Pt denies barriers to medicaiton compliance.  PSYCHOSOCIAL ASSESSMENT:  PHQ-0.  Pt exhibits positive coping skills, hopeful outlook with supportive family. No psychosocial needs identified at this time, no psychosocial interventions necessary.    Pt enjoys reading, playing cards, having lunch with friends, working in [employer].   Pt oriented to exercise equipment and routine.    Understanding verbalized. Harrell Gave RN BSN  ? ? ?Dr. Fransico Him is Medical Director for Cardiac Rehab at [facility]. ?

## 2022-01-06 NOTE — Progress Notes (Incomplete)
Cardiac Individual Treatment Plan  Patient Details  Name: Sydney Reid MRN: [id number] Date of Birth: 1940-12-04 Referring Provider:   Flowsheet Row CARDIAC REHAB PHASE II ORIENTATION from 01/02/2022 in [place]  Referring Provider Dr. Adrian Prows MD       Initial Encounter Date:  [place] from 01/02/2022 in [place]  Date 01/02/22       Visit Diagnosis: 08/06/21 S/P TAVR  Patient's Home Medications on Admission:  Current Outpatient Medications:    ammonium lactate (LAC-HYDRIN) 12 % lotion, Apply 1 application topically as needed for dry skin., Disp: , Rfl:    amoxicillin (AMOXIL) 500 MG tablet, Take 1 tablet (500 mg total) by mouth as directed. Take 4 tablets by mouth 1 hour prior to dental work and cleanings (Patient not taking: Reported on 12/05/2021), Disp: 12 tablet, Rfl: 12   aspirin EC 81 MG tablet, Take 81 mg by mouth daily. Swallow whole., Disp: , Rfl:    Cholecalciferol (VITAMIN D3) 250 MCG (10000 UT) capsule, Take 4,000 Units by mouth daily., Disp: , Rfl:    etanercept (ENBREL) 50 MG/ML injection, Inject 50 mg into the skin every Sunday. , Disp: , Rfl:    ezetimibe (ZETIA) 10 MG tablet, Take 1 tablet (10 mg total) by mouth daily., Disp: 30 tablet, Rfl: 0   HYDROcodone-acetaminophen (NORCO) 5-325 MG tablet, Take 1 tablet by mouth 2 (two) times daily., Disp: 60 tablet, Rfl: 0   levothyroxine (SYNTHROID) 88 MCG tablet, Take one tablet by mouth once daily 30 minutes before breakfast on an empty stomach for thyroid., Disp: 90 tablet, Rfl: 3   simvastatin (ZOCOR) 10 MG tablet, TAKE 1 TABLET BY MOUTH EVERYDAY AT BEDTIME, Disp: 90 tablet, Rfl: 3  Past Medical History: Past Medical History:  Diagnosis Date   Complication of anesthesia    Difficult intubation    20-25 years ago   H/O total knee replacement 10/28/1999   Right   High cholesterol    History of tonsillectomy  and adenoidectomy 10/27/1944   History of transcatheter aortic valve replacement (TAVR) 08/06/2021   s/p Edwards 73mm S3 vis TF approach with Dr. Burt Knack and Dr. Cyndia Bent   Psoriatic arthritis [facility]) 10/27/1988   Sleep apnea with use of continuous positive airway pressure (CPAP) 10/27/2002   Thyroid disease     Tobacco Use: Social History   Tobacco Use  Smoking Status Former   Packs/day: 2.00   Years: 18.00   Pack years: 36.00   Types: Cigarettes   Quit date: 1975   Years since quitting: 48.2  Smokeless Tobacco Never    Labs: Recent Review Scientist, physiological     Labs for ITP Cardiac and Pulmonary Rehab Latest Ref Rng & Units 04/16/2021 04/16/2021 06/06/2021 08/02/2021 08/06/2021   Cholestrol 100 - 199 mg/dL - - 155 - -   LDLCALC 0 - 99 mg/dL - - 87 - -   HDL >39 mg/dL - - 48 - -   Trlycerides 0 - 149 mg/dL - - 107 - -   PHART 7.350 - 7.450 - 7.347(L) - 7.398 -   PCO2ART 32.0 - 48.0 mmHg - 43.0 - 35.4 -   HCO3 20.0 - 28.0 mmol/L 23.2 23.6 - 21.4 -   TCO2 22 - 32 mmol/L 25 25 - - 25   ACIDBASEDEF 0.0 - 2.0 mmol/L 3.0(H) 2.0 - 2.7(H) -   O2SAT % 78.0 100.0 - 98.3 -  Capillary Blood Glucose: No results found for: GLUCAP   Exercise Target Goals: Exercise Program Goal: Individual exercise prescription set using results from initial 6 min walk test and THRR while considering  patients activity barriers and safety.   Exercise Prescription Goal: Starting with aerobic activity 30 plus minutes a day, 3 days per week for initial exercise prescription. Provide home exercise prescription and guidelines that participant acknowledges understanding prior to discharge.  Activity Barriers & Risk Stratification:  Activity Barriers & Cardiac Risk Stratification - 01/02/22 1454       Activity Barriers & Cardiac Risk Stratification   Activity Barriers Neck/Spine Problems;Arthritis;Back Problems;Joint Problems;Balance Concerns;Deconditioning;History of Falls;Right Knee Replacement;Shortness  of Breath    Cardiac Risk Stratification High             6 Minute Walk:  6 Minute Walk     Row Name 01/02/22 1450         6 Minute Walk   Phase Initial     Distance 600 feet     Walk Time 6 minutes     # of Rest Breaks 2  2.20-2-55 and 5.46-6.00 due to high HR     MPH 1.14     METS 1.54     RPE 13     Perceived Dyspnea  2     VO2 Peak 5.4     Symptoms Yes (comment)     Comments 5/10 chronic back pain, relieved with rest. 2 breaks due to elevated HR     Resting HR 68 bpm     Resting BP 106/70     Resting Oxygen Saturation  99 %     Exercise Oxygen Saturation  during 6 min walk 99 %     Max Ex. HR 133 bpm     Max Ex. BP 144/70     2 Minute Post BP 106/68              Oxygen Initial Assessment:   Oxygen Re-Evaluation:   Oxygen Discharge (Final Oxygen Re-Evaluation):   Initial Exercise Prescription:  Initial Exercise Prescription - 01/02/22 1400       Date of Initial Exercise RX and Referring Provider   Date 01/02/22    Referring Provider Dr. Adrian Prows MD    Expected Discharge Date 03/07/22      NuStep   Level 1    SPM 60    METs 1      Prescription Details   Frequency (times per week) 3    Duration Progress to 30 minutes of continuous aerobic without signs/symptoms of physical distress      Intensity   THRR 40-80% of Max Heartrate 56-111    Ratings of Perceived Exertion 11-13    Perceived Dyspnea 0-4      Progression   Progression Continue progressive overload as per policy without signs/symptoms or physical distress.      Resistance Training   Training Prescription No   Pt unable to grip. Will seek out assistive wts            Perform Capillary Blood Glucose checks as needed.  Exercise Prescription Changes:   Exercise Prescription Changes     Row Name 01/06/22 1615             Response to Exercise   Blood Pressure (Admit) 128/72       Blood Pressure (Exercise) 152/78       Blood Pressure (Exit) 102/60       Heart  Rate (  Admit) 100 bpm       Heart Rate (Exercise) 127 bpm       Heart Rate (Exit) 96 bpm       Rating of Perceived Exertion (Exercise) 9       Perceived Dyspnea (Exercise) 0       Symptoms HR exceeded THRR during warmup and stretches, will have pt seated going farward. Some occasional dizziness       Comments Pt first day of exercise in the crp2 program       Duration Progress to 30 minutes of  aerobic without signs/symptoms of physical distress       Intensity THRR New         Progression   Progression Continue to progress workloads to maintain intensity without signs/symptoms of physical distress.       Average METs 3.5         Resistance Training   Training Prescription Yes  Pt unable to grip. Acquired adaptive wts       Weight 1       Reps 10-15       Time 10 Minutes         NuStep   Level 2       SPM 60       METs 3.5                Exercise Comments:   Exercise Comments     Row Name 01/06/22 1625           Exercise Comments Pt first day in the CRP2 program. Pt tolerated exercise well with an average MET level of 3.5. Acquired adaptive 1 lb wts for pt to use. Dr. Einar Gip approved new THRR of 125 (90% of THR). Pt exceeded her new THRR during standing stretches and warm up, will have her do seated exercise going forward. Some mild dizziness that was resolved with rest. Overall off to a good start.                Exercise Goals and Review:   Exercise Goals     Row Name 01/02/22 1459             Exercise Goals   Increase Physical Activity Yes       Intervention Provide advice, education, support and counseling about physical activity/exercise needs.;Develop an individualized exercise prescription for aerobic and resistive training based on initial evaluation findings, risk stratification, comorbidities and participant's personal goals.       Expected Outcomes Short Term: Attend rehab on a regular basis to increase amount of physical activity.;Long Term: Add  in home exercise to make exercise part of routine and to increase amount of physical activity.;Long Term: Exercising regularly at least 3-5 days a week.       Increase Strength and Stamina Yes       Intervention Provide advice, education, support and counseling about physical activity/exercise needs.;Develop an individualized exercise prescription for aerobic and resistive training based on initial evaluation findings, risk stratification, comorbidities and participant's personal goals.       Expected Outcomes Short Term: Increase workloads from initial exercise prescription for resistance, speed, and METs.;Short Term: Perform resistance training exercises routinely during rehab and add in resistance training at home;Long Term: Improve cardiorespiratory fitness, muscular endurance and strength as measured by increased METs and functional capacity (6MWT)       Able to understand and use rate of perceived exertion (RPE) scale Yes  Intervention Provide education and explanation on how to use RPE scale       Expected Outcomes Short Term: Able to use RPE daily in rehab to express subjective intensity level;Long Term:  Able to use RPE to guide intensity level when exercising independently       Able to understand and use Dyspnea scale Yes       Intervention Provide education and explanation on how to use Dyspnea scale       Expected Outcomes Short Term: Able to use Dyspnea scale daily in rehab to express subjective sense of shortness of breath during exertion;Long Term: Able to use Dyspnea scale to guide intensity level when exercising independently       Knowledge and understanding of Target Heart Rate Range (THRR) Yes       Intervention Provide education and explanation of THRR including how the numbers were predicted and where they are located for reference       Expected Outcomes Short Term: Able to state/look up THRR;Long Term: Able to use THRR to govern intensity when exercising independently;Short  Term: Able to use daily as guideline for intensity in rehab       Understanding of Exercise Prescription Yes       Intervention Provide education, explanation, and written materials on patient's individual exercise prescription       Expected Outcomes Short Term: Able to explain program exercise prescription;Long Term: Able to explain home exercise prescription to exercise independently                Exercise Goals Re-Evaluation :  Exercise Goals Re-Evaluation     Row Name 01/06/22 1621             Exercise Goal Re-Evaluation   Exercise Goals Review Increase Physical Activity;Increase Strength and Stamina;Able to understand and use rate of perceived exertion (RPE) scale;Knowledge and understanding of Target Heart Rate Range (THRR);Understanding of Exercise Prescription       Comments Pt first day in the CRP2 program. Pt tolerated exercise well with an average MET level of 3.5. Acquired adaptive 1 lb wts for pt to use. Pt exceeded her THRR during standing stretches and warm up, will have her do seated exercise going forward. Some mild dizziness that was resolved with rest. Pt is learning her THRR, RPE and ExRx. Overall off to a good start.       Expected Outcomes Will continue to monitor pt and progress workloads as tolerated by her [facility]                 Discharge Exercise Prescription (Final Exercise Prescription Changes):  Exercise Prescription Changes - 01/06/22 1615       Response to Exercise   Blood Pressure (Admit) 128/72    Blood Pressure (Exercise) 152/78    Blood Pressure (Exit) 102/60    Heart Rate (Admit) 100 bpm    Heart Rate (Exercise) 127 bpm    Heart Rate (Exit) 96 bpm    Rating of Perceived Exertion (Exercise) 9    Perceived Dyspnea (Exercise) 0    Symptoms HR exceeded THRR during warmup and stretches, will have pt seated going farward. Some occasional dizziness    Comments Pt first day of exercise in the crp2 program    Duration Progress to 30 minutes of   aerobic without signs/symptoms of physical distress    Intensity THRR New      Progression   Progression Continue to progress workloads to maintain intensity without signs/symptoms of  physical distress.    Average METs 3.5      Resistance Training   Training Prescription Yes   Pt unable to grip. Acquired adaptive wts   Weight 1    Reps 10-15    Time 10 Minutes      NuStep   Level 2    SPM 60    METs 3.5             Nutrition:  Target Goals: Understanding of nutrition guidelines, daily intake of sodium '1500mg'$ , cholesterol '200mg'$ , calories 30% from fat and 7% or less from saturated fats, daily to have 5 or more servings of fruits and vegetables.  Biometrics:  Pre Biometrics - 01/02/22 1352       Pre Biometrics   Waist Circumference 40.5 inches    Hip Circumference 40.25 inches    Waist to Hip Ratio 1.01 %    Triceps Skinfold 15 mm    % Body Fat 37.9 %    Grip Strength --   Pt unable to grip   Flexibility 5 in    Single Leg Stand 4.7 seconds              Nutrition Therapy Plan and Nutrition Goals:   Nutrition Assessments:  MEDIFICTS Score Key: ?70 Need to make dietary changes  40-70 Heart Healthy Diet ? 40 Therapeutic Level Cholesterol Diet   Picture Your Plate Scores: <10 Unhealthy dietary pattern with much room for improvement. 41-50 Dietary pattern unlikely to meet recommendations for good health and room for improvement. 51-60 More healthful dietary pattern, with some room for improvement.  >60 Healthy dietary pattern, although there may be some specific behaviors that could be improved.    Nutrition Goals Re-Evaluation:   Nutrition Goals Discharge (Final Nutrition Goals Re-Evaluation):   Psychosocial: Target Goals: Acknowledge presence or absence of significant depression and/or stress, maximize coping skills, provide positive support system. Participant is able to verbalize types and ability to use techniques and skills needed for reducing  stress and depression.  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 Yes   [name] has her husband and frieds who live in the area for support     Barriers   Psychosocial barriers to participate in program The patient should benefit from training in stress management and relaxation.      Screening Interventions   Interventions Encouraged to exercise    Expected Outcomes Long Term Goal: Stressors or current issues are controlled or eliminated.             Quality of Life Scores:  Quality of Life - 01/02/22 1502       Quality of Life   Select Quality of Life      Quality of Life Scores   Health/Function Pre 25 %    Socioeconomic Pre 26.56 %    Psych/Spiritual Pre 27.86 %    Family Pre 26.6 %    GLOBAL Pre 26.16 %            Scores of 19 and below usually indicate a poorer quality of life in these areas.  A difference of  2-3 points is a clinically meaningful difference.  A difference of 2-3 points  in the total score of the Quality of Life Index has been associated with significant improvement in overall quality of life, self-image, physical symptoms, and general health in studies assessing change in quality of life.  PHQ-9: Recent Review Flowsheet Data     Depression screen [facility] 2/9 01/02/2022 08/01/2021 05/15/2020 10/17/2019 05/16/2019   Decreased Interest 0 0 0 0 0   Down, Depressed, Hopeless 0 0 0 0 0   PHQ - 2 Score 0 0 0 0 0      Interpretation of Total Score  Total Score Depression Severity:  1-4 = Minimal depression, 5-9 = Mild depression, 10-14 = Moderate depression, 15-19 = Moderately severe depression, 20-27 = Severe depression   Psychosocial Evaluation and Intervention:   Psychosocial Re-Evaluation:  Psychosocial  Re-Evaluation     [place] Name 01/06/22 1552             Psychosocial Re-Evaluation   Current issues with Current Stress Concerns       Comments [name] did reports she continues to have stress concerns related to her health.       Expected Outcomes Pati wii have controlled or decreased stress upon completion of phase 2 cardiac rehab       Interventions Stress management education;Relaxation education;Encouraged to attend Cardiac Rehabilitation for the exercise       Continue Psychosocial Services  Follow up required by staff         Initial Review   Source of Stress Concerns Chronic Illness       Comments Will continue to monitor and provide support as needed                Psychosocial Discharge (Final Psychosocial Re-Evaluation):  Psychosocial Re-Evaluation - 01/06/22 1552       Psychosocial Re-Evaluation   Current issues with Current Stress Concerns    Comments [name] did reports she continues to have stress concerns related to her health.    Expected Outcomes Pati wii have controlled or decreased stress upon completion of phase 2 cardiac rehab    Interventions Stress management education;Relaxation education;Encouraged to attend Cardiac Rehabilitation for the exercise    Continue Psychosocial Services  Follow up required by staff      Initial Review   Source of Stress Concerns Chronic Illness    Comments Will continue to monitor and provide support as needed             Vocational Rehabilitation: Provide vocational rehab assistance to qualifying candidates.   Vocational Rehab Evaluation & Intervention:  Vocational Rehab - 01/02/22 1357       Initial Vocational Rehab Evaluation & Intervention   Assessment shows need for Vocational Rehabilitation No   [name] is retired and does not need vocational rehab at this time            Education: Education Goals: Education classes will be provided on a weekly basis, covering required topics. Participant will state  understanding/return demonstration of topics presented.  Learning Barriers/Preferences:  Learning Barriers/Preferences - 01/02/22 1507       Learning Barriers/Preferences   Learning Barriers Sight;Exercise Concerns   pt wears glasses, pt has dizziness daily, pt is SOB, pt has balance issues   Learning Preferences Audio;Group Instruction;Individual Instruction;Skilled Demonstration;Verbal Instruction             Education Topics: Hypertension, Hypertension Reduction -Define heart disease and high blood pressure. Discus how high blood pressure affects the body and ways to reduce high blood pressure.   Exercise  and Your Heart -Discuss why it is important to exercise, the FITT principles of exercise, normal and abnormal responses to exercise, and how to exercise safely.   Angina -Discuss definition of angina, causes of angina, treatment of angina, and how to decrease risk of having angina.   Cardiac Medications -Review what the following cardiac medications are used for, how they affect the body, and side effects that may occur when taking the medications.  Medications include Aspirin, Beta blockers, calcium channel blockers, ACE Inhibitors, angiotensin receptor blockers, diuretics, digoxin, and antihyperlipidemics.   Congestive Heart Failure -Discuss the definition of CHF, how to live with CHF, the signs and symptoms of CHF, and how keep track of weight and sodium intake.   Heart Disease and Intimacy -Discus the effect sexual activity has on the heart, how changes occur during intimacy as we age, and safety during sexual activity.   Smoking Cessation / COPD -Discuss different methods to quit smoking, the health benefits of quitting smoking, and the definition of COPD.   Nutrition I: Fats -Discuss the types of cholesterol, what cholesterol does to the heart, and how cholesterol levels can be controlled.   Nutrition II: Labels -Discuss the different components of food  labels and how to read food label   Heart Parts/Heart Disease and PAD -Discuss the anatomy of the heart, the pathway of blood circulation through the heart, and these are affected by heart disease.   Stress I: Signs and Symptoms -Discuss the causes of stress, how stress may lead to anxiety and depression, and ways to limit stress.   Stress II: Relaxation -Discuss different types of relaxation techniques to limit stress.   Warning Signs of Stroke / TIA -Discuss definition of a stroke, what the signs and symptoms are of a stroke, and how to identify when someone is having stroke.   Knowledge Questionnaire Score:  Knowledge Questionnaire Score - 01/02/22 1507       Knowledge Questionnaire Score   Pre Score 22/24             Core Components/Risk Factors/Patient Goals at Admission:  Personal Goals and Risk Factors at Admission - 01/02/22 1509       Core Components/Risk Factors/Patient Goals on Admission    Weight Management Yes;Weight Loss    Intervention Weight Management: Develop a combined nutrition and exercise program designed to reach desired caloric intake, while maintaining appropriate intake of nutrient and fiber, sodium and fats, and appropriate energy expenditure required for the weight goal.;Weight Management: Provide education and appropriate resources to help participant work on and attain dietary goals.    Admit Weight 145 lb 4.5 oz (65.9 kg)    Expected Outcomes Short Term: Continue to assess and modify interventions until short term weight is achieved;Long Term: Adherence to nutrition and physical activity/exercise program aimed toward attainment of established weight goal;Weight Loss: Understanding of general recommendations for a balanced deficit meal plan, which promotes 1-2 lb weight loss per week and includes a negative energy balance of [phone] kcal/d;Understanding recommendations for meals to include 15-35% energy as protein, 25-35% energy from fat, 35-60%  energy from carbohydrates, less than $RemoveB'200mg'LWUfdccL$  of dietary cholesterol, 20-35 gm of total fiber daily;Understanding of distribution of calorie intake throughout the day with the consumption of 4-5 meals/snacks    Lipids Yes    Intervention Provide education and support for participant on nutrition & aerobic/resistive exercise along with prescribed medications to achieve LDL '70mg'$ , HDL >$Remo'40mg'iVTMQ$ .    Expected Outcomes Short Term: Participant states understanding of  desired cholesterol values and is compliant with medications prescribed. Participant is following exercise prescription and nutrition guidelines.;Long Term: Cholesterol controlled with medications as prescribed, with individualized exercise RX and with personalized nutrition plan. Value goals: LDL < $Rem'70mg'nPIT$ , HDL > 40 mg.    Personal Goal Other Yes    Personal Goal Short and long the same: control SOB and be able to dance without SOB    Intervention Will continue to monitor pt and progress workloads as tolerated without sign or symptom    Expected Outcomes Pt will achieve her goals with time and gradual progression             Core Components/Risk Factors/Patient Goals Review:   Goals and Risk Factor Review     Row Name 01/06/22 1555             Core Components/Risk Factors/Patient Goals Review   Personal Goals Review Weight Management/Obesity;Lipids;Stress       Review [name] started cardiac rehab on 01/06/22. Pat did well with exercise for her fitness level as [name] is somewhat deconditoned       Expected Outcomes [name] will continue to participate in phase 2 cardiac rehab for exercise, nutrition and lifestyle modifications                Core Components/Risk Factors/Patient Goals at Discharge (Final Review):   Goals and Risk Factor Review - 01/06/22 1555       Core Components/Risk Factors/Patient Goals Review   Personal Goals Review Weight Management/Obesity;Lipids;Stress    Review [name] started cardiac rehab on 01/06/22. Pat did well  with exercise for her fitness level as [name] is somewhat deconditoned    Expected Outcomes [name] will continue to participate in phase 2 cardiac rehab for exercise, nutrition and lifestyle modifications             ITP Comments:  ITP Comments     Row Name 01/02/22 1355 01/06/22 1550         ITP Comments Dr Fransico Him MD, Medical Director 30 Day ITP Review. Pat started cardiac rehab on 01/06/22. Pat did well with exercise and stayed within her age predicted heart rate for her age per Dr Einar Gip               Comments: See ITP comments.Barnet Pall, RN,BSN 01/06/2022 4:55 PM

## 2022-01-07 ENCOUNTER — Encounter: Payer: Self-pay | Admitting: *Deleted

## 2022-01-08 ENCOUNTER — Ambulatory Visit (HOSPITAL_COMMUNITY): Payer: Medicare Other

## 2022-01-08 ENCOUNTER — Other Ambulatory Visit: Payer: Self-pay

## 2022-01-08 ENCOUNTER — Encounter (HOSPITAL_COMMUNITY)
Admission: RE | Admit: 2022-01-08 | Discharge: 2022-01-08 | Disposition: A | Discharge status: Home / Self Care | Payer: Medicare Other | Source: Ambulatory Visit | Attending: Cardiology | Admitting: Cardiology

## 2022-01-08 DIAGNOSIS — Z952 Presence of prosthetic heart valve: Secondary | ICD-10-CM | POA: Diagnosis not present

## 2022-01-10 ENCOUNTER — Other Ambulatory Visit: Payer: Self-pay

## 2022-01-10 ENCOUNTER — Encounter (HOSPITAL_COMMUNITY)
Admission: RE | Admit: 2022-01-10 | Discharge: 2022-01-10 | Disposition: A | Discharge status: Home / Self Care | Payer: Medicare Other | Source: Ambulatory Visit | Attending: Cardiology | Admitting: Cardiology

## 2022-01-10 ENCOUNTER — Ambulatory Visit (HOSPITAL_COMMUNITY): Payer: Medicare Other

## 2022-01-10 DIAGNOSIS — Z952 Presence of prosthetic heart valve: Secondary | ICD-10-CM

## 2022-01-13 ENCOUNTER — Other Ambulatory Visit: Payer: Self-pay

## 2022-01-13 ENCOUNTER — Ambulatory Visit (HOSPITAL_COMMUNITY): Payer: Medicare Other

## 2022-01-13 ENCOUNTER — Encounter (HOSPITAL_COMMUNITY)
Admission: RE | Admit: 2022-01-13 | Discharge: 2022-01-13 | Disposition: A | Discharge status: Home / Self Care | Payer: Medicare Other | Source: Ambulatory Visit | Attending: Cardiology | Admitting: Cardiology

## 2022-01-13 DIAGNOSIS — Z952 Presence of prosthetic heart valve: Secondary | ICD-10-CM

## 2022-01-15 ENCOUNTER — Encounter (HOSPITAL_COMMUNITY): Payer: Medicare Other

## 2022-01-15 ENCOUNTER — Ambulatory Visit (HOSPITAL_COMMUNITY): Payer: Medicare Other

## 2022-01-16 ENCOUNTER — Telehealth: Payer: Self-pay | Admitting: Medical Oncology

## 2022-01-16 DIAGNOSIS — L4050 Arthropathic psoriasis, unspecified: Secondary | ICD-10-CM | POA: Diagnosis not present

## 2022-01-16 NOTE — Telephone Encounter (Signed)
I called several times to contact pt . Left vm to return my call. ?

## 2022-01-16 NOTE — Telephone Encounter (Signed)
Pt called and said "I can't  put one foot in front of the other.I am so tired". ? ?03/06-phlebotomy -500 gms blood removed. ? ?F/U 6 months ?

## 2022-01-17 ENCOUNTER — Encounter (HOSPITAL_BASED_OUTPATIENT_CLINIC_OR_DEPARTMENT_OTHER): Payer: Self-pay

## 2022-01-17 ENCOUNTER — Encounter (HOSPITAL_COMMUNITY): Payer: Medicare Other

## 2022-01-17 ENCOUNTER — Telehealth (HOSPITAL_COMMUNITY): Payer: Self-pay | Admitting: Nurse Practitioner

## 2022-01-17 ENCOUNTER — Emergency Department (HOSPITAL_BASED_OUTPATIENT_CLINIC_OR_DEPARTMENT_OTHER): Payer: Medicare Other | Admitting: Radiology

## 2022-01-17 ENCOUNTER — Emergency Department (HOSPITAL_BASED_OUTPATIENT_CLINIC_OR_DEPARTMENT_OTHER)
Admission: EM | Admit: 2022-01-17 | Discharge: 2022-01-17 | Disposition: A | Discharge status: Home / Self Care | Payer: Medicare Other | Attending: Emergency Medicine | Admitting: Emergency Medicine

## 2022-01-17 ENCOUNTER — Ambulatory Visit (HOSPITAL_COMMUNITY): Payer: Medicare Other

## 2022-01-17 ENCOUNTER — Other Ambulatory Visit: Payer: Self-pay

## 2022-01-17 DIAGNOSIS — Z96651 Presence of right artificial knee joint: Secondary | ICD-10-CM | POA: Diagnosis not present

## 2022-01-17 DIAGNOSIS — R531 Weakness: Secondary | ICD-10-CM | POA: Diagnosis not present

## 2022-01-17 DIAGNOSIS — Z7982 Long term (current) use of aspirin: Secondary | ICD-10-CM | POA: Insufficient documentation

## 2022-01-17 DIAGNOSIS — R5383 Other fatigue: Secondary | ICD-10-CM | POA: Insufficient documentation

## 2022-01-17 LAB — CBC WITH DIFFERENTIAL/PLATELET
Abs Immature Granulocytes: 0.01 10*3/uL (ref 0.00–0.07)
Basophils Absolute: 0 10*3/uL (ref 0.0–0.1)
Basophils Relative: 0 %
Eosinophils Absolute: 0 10*3/uL (ref 0.0–0.5)
Eosinophils Relative: 0 %
HCT: 44.2 % (ref 36.0–46.0)
Hemoglobin: 13.3 g/dL (ref 12.0–15.0)
Immature Granulocytes: 0 %
Lymphocytes Relative: 33 %
Lymphs Abs: 2 10*3/uL (ref 0.7–4.0)
MCH: 25 pg — ABNORMAL LOW (ref 26.0–34.0)
MCHC: 30.1 g/dL (ref 30.0–36.0)
MCV: 82.9 fL (ref 80.0–100.0)
Monocytes Absolute: 0.6 10*3/uL (ref 0.1–1.0)
Monocytes Relative: 10 %
Neutro Abs: 3.5 10*3/uL (ref 1.7–7.7)
Neutrophils Relative %: 57 %
Platelets: 218 10*3/uL (ref 150–400)
RBC: 5.33 MIL/uL — ABNORMAL HIGH (ref 3.87–5.11)
RDW: 15.2 % (ref 11.5–15.5)
WBC: 6.1 10*3/uL (ref 4.0–10.5)
nRBC: 0 % (ref 0.0–0.2)

## 2022-01-17 LAB — COMPREHENSIVE METABOLIC PANEL
ALT: 11 U/L (ref 0–44)
AST: 19 U/L (ref 15–41)
Albumin: 4.3 g/dL (ref 3.5–5.0)
Alkaline Phosphatase: 64 U/L (ref 38–126)
Anion gap: 8 (ref 5–15)
BUN: 12 mg/dL (ref 8–23)
CO2: 25 mmol/L (ref 22–32)
Calcium: 9.7 mg/dL (ref 8.9–10.3)
Chloride: 106 mmol/L (ref 98–111)
Creatinine, Ser: 0.99 mg/dL (ref 0.44–1.00)
GFR, Estimated: 57 mL/min — ABNORMAL LOW (ref >60)
Glucose, Bld: 83 mg/dL (ref 70–99)
Potassium: 4.3 mmol/L (ref 3.5–5.1)
Sodium: 139 mmol/L (ref 135–145)
Total Bilirubin: 0.5 mg/dL (ref 0.3–1.2)
Total Protein: 8.1 g/dL (ref 6.5–8.1)

## 2022-01-17 LAB — TSH: TSH: 0.577 u[IU]/mL (ref 0.350–4.500)

## 2022-01-17 LAB — LACTATE DEHYDROGENASE: LDH: 219 U/L — ABNORMAL HIGH (ref 98–192)

## 2022-01-17 IMAGING — DX DG CHEST 2V
2 series · 2 of 2 positions shown · non-contrast
Comparison: [DATE]

CLINICAL DATA: Generalized weakness

EXAM:
CHEST - 2 VIEW

[chest pa]
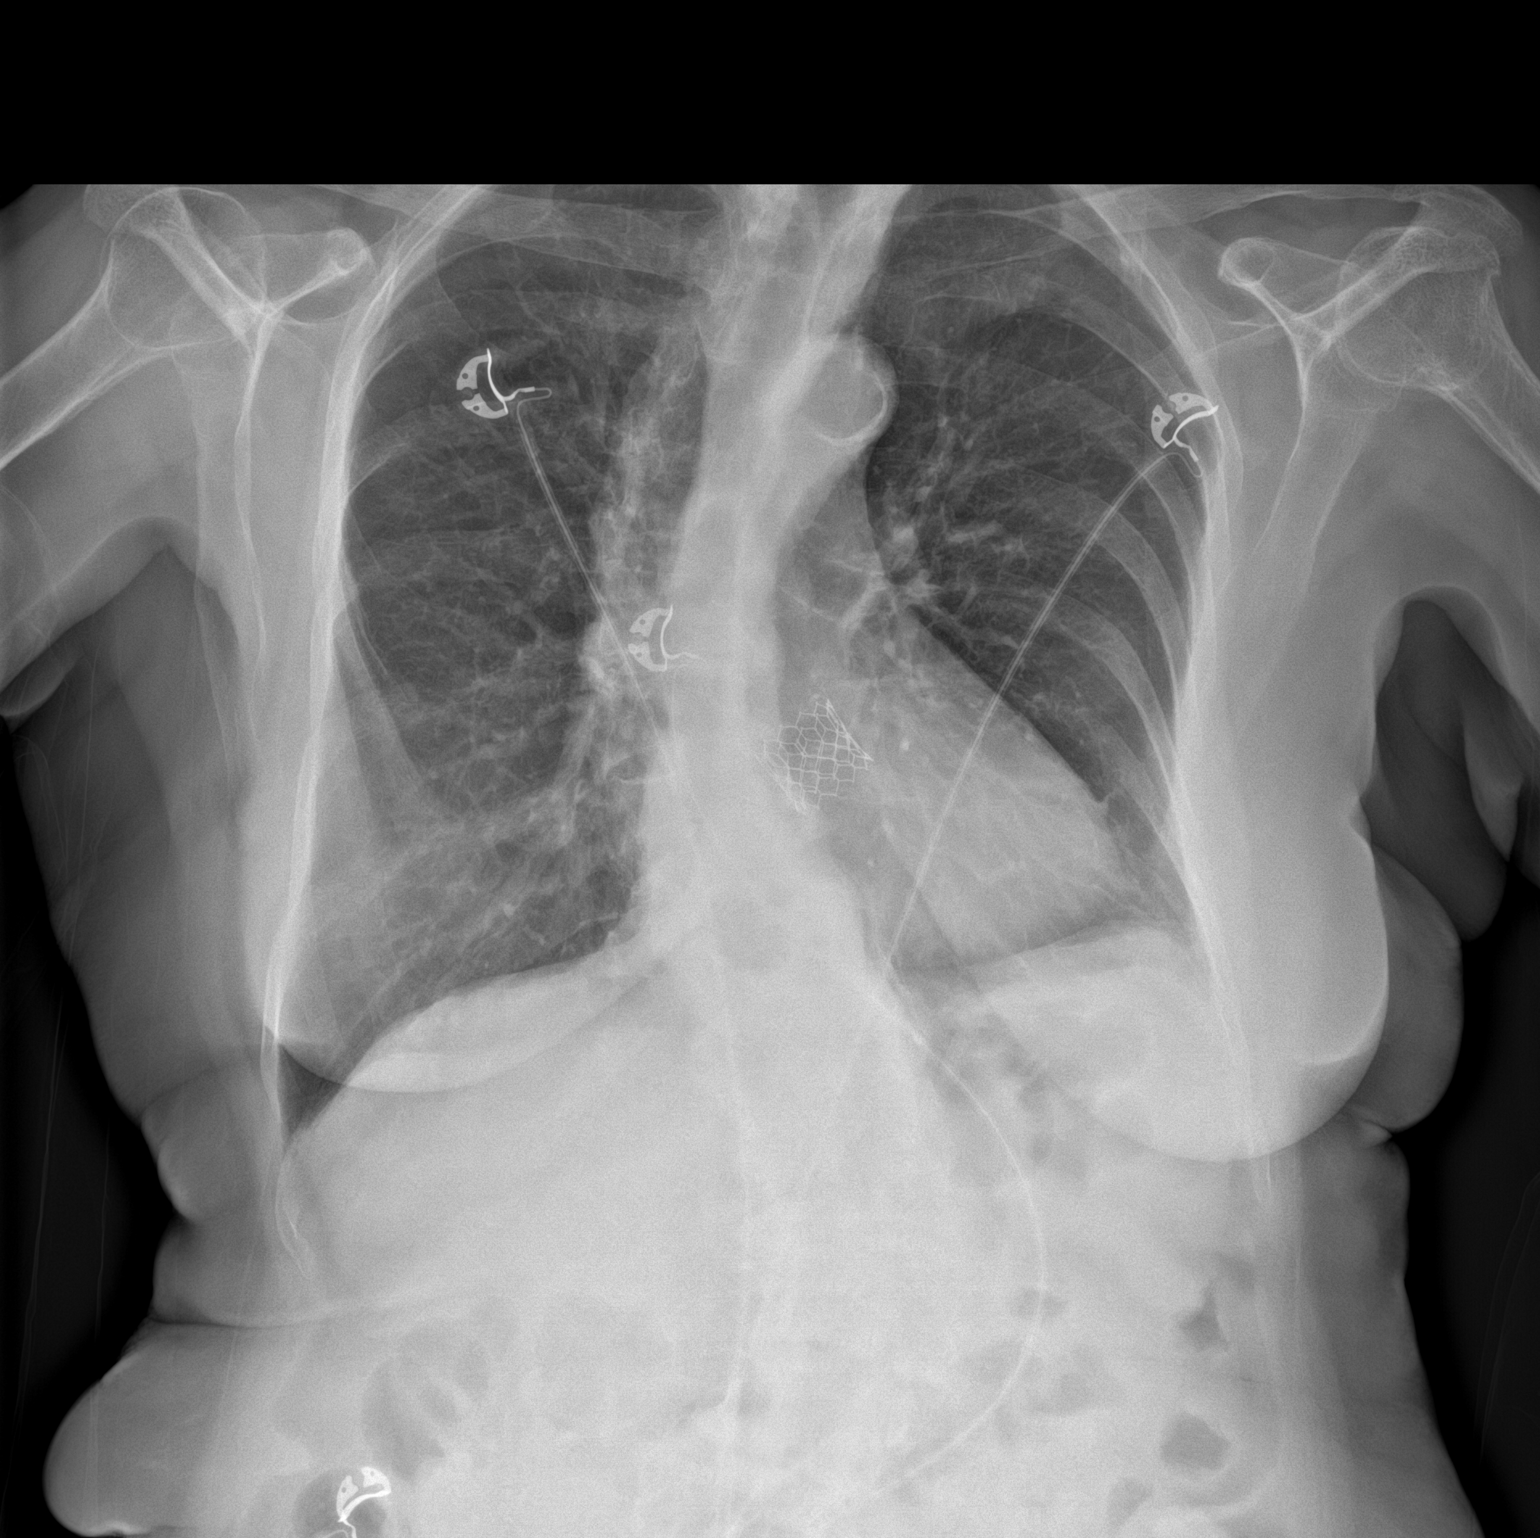

[chest lat]
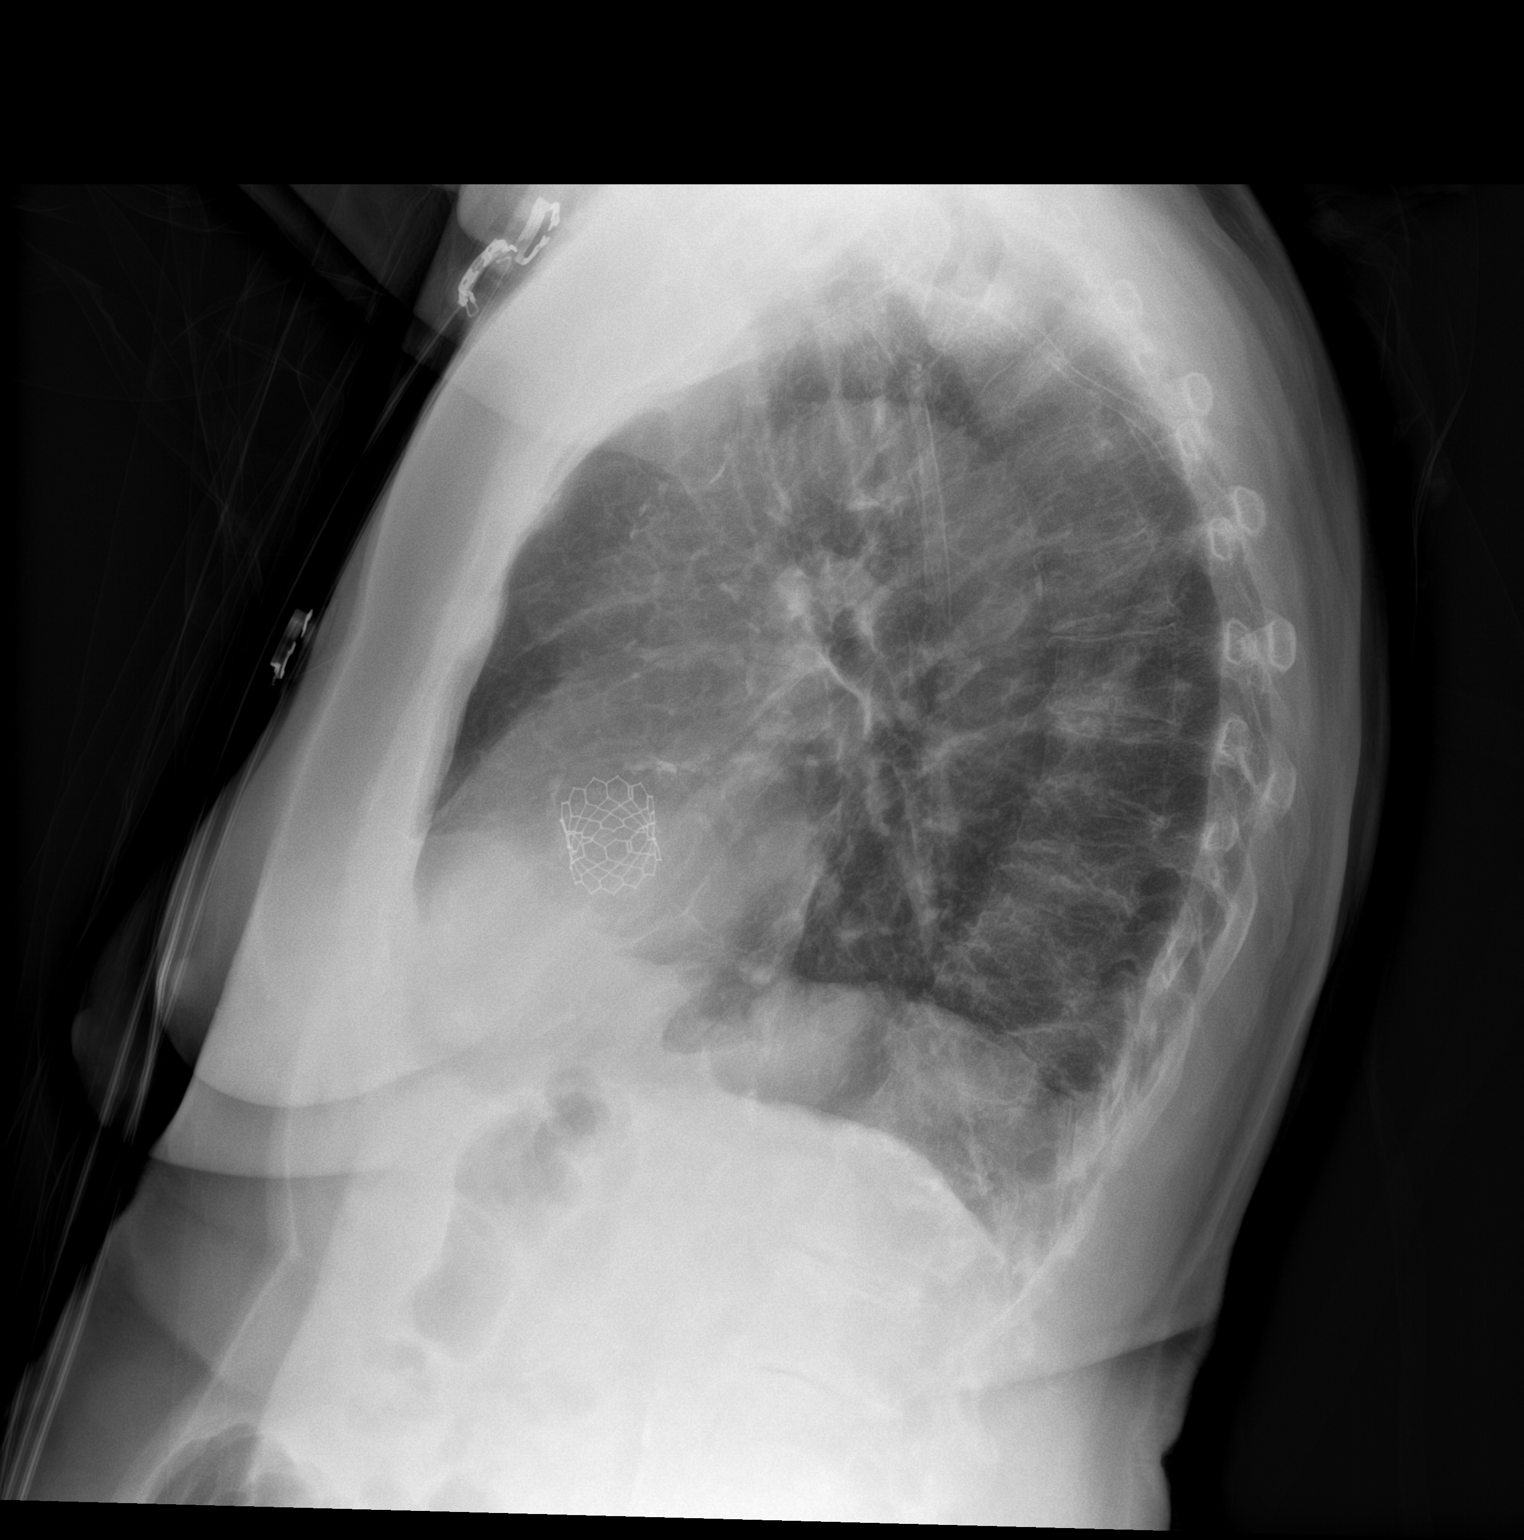

[2 of 2 positions shown; findings below may reference images not displayed]

FINDINGS: Cardiac size is within normal limits. Stent is seen in the region of
aortic valve. Low position of diaphragms suggests COPD. There are no
signs of pulmonary edema or focal pulmonary consolidation. There is
no pleural effusion or pneumothorax. S shaped scoliosis is seen in
the thoracic and lumbar spine.
IMPRESSION: COPD. There are no signs of pulmonary edema or focal pulmonary
consolidation.

## 2022-01-17 NOTE — Telephone Encounter (Signed)
Pt request I send her last OV note and labs to there [facility], Dr. Kathlene November. Records have been sent. ?

## 2022-01-17 NOTE — ED Notes (Signed)
Patient transported to X-ray 

## 2022-01-17 NOTE — Telephone Encounter (Signed)
I have called the pt and advised as indicated. Pt declined to go to the ER. I strongly encouraged the pt to the ER as recommended by Dr. Nicanor Alcon but pt states she "does not have time because she has other things going on". Pt was again encouraged to go and offered location options, including [facility]. Pt expressed understanding of this information. ? ?Of note, pt is in cardiac rehab and recently had a TAVR. This information was highlighted for the pt as additional encouragement to go to the ER and again pt declined the recommendation. ?

## 2022-01-17 NOTE — ED Triage Notes (Signed)
States have been feeling weak with some dizziness.  Called MD who ask her to come to ED to  bee seen.  States not sure why MD told her to go to ED.  Onset of dizziness and tiredness beginning of March.  States has had work up showing suffers from having too much Iron in blood. ?

## 2022-01-17 NOTE — ED Provider Notes (Signed)
?[facility] EMERGENCY DEPT ?Provider Note ? ? ?CSN: [id number] ?Arrival date & time: 01/17/22  1306 ? ?  ? ?History ? ?Chief Complaint  ?Patient presents with  ? Fatigue  ? ? ?Sydney Reid is a 81 y.o. female. ? ?HPI ?Patient presents with increasing fatigue.  Has had for around 2 weeks now.  States she has been sleeping more and tired more.  Also started cardiac rehab about that time.  Has had a TAVR somewhat recently.  Has a history of polycythemia vera and around 3 weeks ago had a phlebotomy of 500 cc.  Was feeling good for about a week after but then began to feel worse.  No chest pain.  States the cardiac rehab is rather intense and she goes 3-4 times a week.  No weight loss.  No fevers or chills.  Is on Synthroid and states the last time the level was checked it was around 3 months ago.  Reportedly went to PCP for some other blood work that was just due to be drawn because of her biologic use.  States her platelets were little low. ?  ?Past Medical History:  ?Diagnosis Date  ? Complication of anesthesia   ? Difficult intubation   ? 20-25 years ago  ? H/O total knee replacement 10/28/1999  ? Right  ? High cholesterol   ? History of tonsillectomy and adenoidectomy 10/27/1944  ? History of transcatheter aortic valve replacement (TAVR) 08/06/2021  ? s/p Edwards 33m S3 vis TF approach with Dr. CBurt Knackand Dr. BCyndia Bent ? [place]  ? Sleep apnea with use of continuous positive airway pressure (CPAP) 10/27/2002  ? Thyroid disease   ? ? ?Home Medications ?Prior to Admission medications   ?Medication Sig Start Date End Date Taking? Authorizing Provider  ?ammonium lactate (LAC-HYDRIN) 12 % lotion Apply 1 application topically as needed for dry skin. 01/08/21   [provider]  ?amoxicillin (AMOXIL) 500 MG tablet Take 1 tablet (500 mg total) by mouth as directed. Take 4 tablets by mouth 1 hour prior to dental work and cleanings ?Patient not taking: Reported on 12/05/2021  08/14/21   TEileen Stanford PA-C  ?aspirin EC 81 MG tablet Take 81 mg by mouth daily. Swallow whole.    [provider]  ?Cholecalciferol (VITAMIN D3) 250 MCG (10000 UT) capsule Take 4,000 Units by mouth daily.    [provider]  ?etanercept (ENBREL) 50 MG/ML injection Inject 50 mg into the skin every Sunday.     [provider]  ?ezetimibe (ZETIA) 10 MG tablet Take 1 tablet (10 mg total) by mouth daily. 06/12/21   ELauree Chandler NP  ?HYDROcodone-acetaminophen (NORCO) 5-325 MG tablet Take 1 tablet by mouth 2 (two) times daily. 12/16/21   ELauree Chandler NP  ?levothyroxine (SYNTHROID) 88 MCG tablet Take one tablet by mouth once daily 30 minutes before breakfast on an empty stomach for thyroid. 02/26/21   ELauree Chandler NP  ?simvastatin (ZOCOR) 10 MG tablet TAKE 1 TABLET BY MOUTH EVERYDAY AT BEDTIME 06/26/21   GAdrian Prows MD  ?   ? ?Allergies    ?Codeine and Statins   ? ?Review of Systems   ?Review of Systems ? ?Physical Exam ?Updated Vital Signs ?BP (!) 128/99   Pulse 80   Temp 97.6 ?F (36.4 ?C) (Oral)   Resp 19   Ht '5\' 3"'$  (1.6 m)   Wt 64.4 kg   SpO2 98%   BMI 25.15 kg/m?  ?Physical  Exam ?Constitutional:   ?   Appearance: Normal appearance.  ?HENT:  ?   Head: Normocephalic.  ?Eyes:  ?   Pupils: Pupils are equal, round, and reactive to light.  ?Cardiovascular:  ?   Rate and Rhythm: Regular rhythm.  ?Pulmonary:  ?   Breath sounds: No wheezing or rhonchi.  ?Abdominal:  ?   Tenderness: There is no abdominal tenderness.  ?Musculoskeletal:  ?   Right lower leg: No edema.  ?   Left lower leg: No edema.  ?Skin: ?   General: Skin is warm.  ?   Capillary Refill: Capillary refill takes less than 2 seconds.  ?Neurological:  ?   Mental Status: She is alert and oriented to person, place, and time.  ? ? ?ED Results / Procedures / Treatments   ?Labs ?(all labs ordered are listed, but only abnormal results are displayed) ?Labs Reviewed  ?COMPREHENSIVE METABOLIC PANEL - Abnormal; Notable  for the following components:  ?    Result Value  ? GFR, Estimated 57 (*)   ? All other components within normal limits  ?LACTATE DEHYDROGENASE - Abnormal; Notable for the following components:  ? LDH 219 (*)   ? All other components within normal limits  ?CBC WITH DIFFERENTIAL/PLATELET - Abnormal; Notable for the following components:  ? RBC 5.33 (*)   ? MCH 25.0 (*)   ? All other components within normal limits  ?TSH  ?CBC WITH DIFFERENTIAL/PLATELET  ?CBC  ?DIFFERENTIAL  ? ? ?EKG ?None ? ?Radiology ?DG Chest 2 View ? ?Result Date: 01/17/2022 ?CLINICAL DATA:  Generalized weakness EXAM: CHEST - 2 VIEW COMPARISON:  08/02/2021 FINDINGS: Cardiac size is within normal limits. Stent is seen in the region of aortic valve. Low position of diaphragms suggests COPD. There are no signs of pulmonary edema or focal pulmonary consolidation. There is no pleural effusion or pneumothorax. S shaped scoliosis is seen in the thoracic and lumbar spine. IMPRESSION: COPD. There are no signs of pulmonary edema or focal pulmonary consolidation. Electronically Signed   By: Elmer Picker M.D.   On: 01/17/2022 16:20   ? ?Procedures ?Procedures  ? ? ?Medications Ordered in ED ?Medications - No data to display ? ?ED Course/ Medical Decision Making/ A&P ?  ?                        ?Medical Decision Making ?Amount and/or Complexity of Data Reviewed ?Labs: ordered. ?Radiology: ordered. ? ? ?Patient presents with fatigue.  More sleepiness.  Has had over the last weeks.  Has a history of polycythemia.  Had phlebotomy done a week or 2 before began feeling more fatigued.  Although did start cardiac rehab.  States it appears that that is a lot at work.  Lab work done and reassuring.  Hemoglobin 13 which is pretty good for her.  TSH reassuring.  Chest x-ray independently interpreted and reassuring.  Vitals reassuring.  No clear cause of the fatigue although potentially could be due to the extra work she has had to do.  Appears stable for discharge  home.  I reviewed previous hematology notes. ? ? ? ? ? ? ? ?Final Clinical Impression(s) / ED Diagnoses ?Final diagnoses:  ?Fatigue, unspecified type  ? ? ?Rx / DC Orders ?ED Discharge Orders   ? ? None  ? ?  ? ? ?  ?Davonna Belling, MD ?01/17/22 2318 ? ?

## 2022-01-17 NOTE — ED Notes (Signed)
States had blood drawn yesterday and would farther wait to be seen by MD before having more drawn. ?

## 2022-01-20 ENCOUNTER — Ambulatory Visit (HOSPITAL_COMMUNITY): Payer: Medicare Other

## 2022-01-20 ENCOUNTER — Telehealth (HOSPITAL_COMMUNITY): Payer: Self-pay | Admitting: *Deleted

## 2022-01-20 ENCOUNTER — Encounter (HOSPITAL_COMMUNITY)
Admission: RE | Admit: 2022-01-20 | Discharge: 2022-01-20 | Disposition: A | Discharge status: Home / Self Care | Payer: Medicare Other | Source: Ambulatory Visit | Attending: Cardiology | Admitting: Cardiology

## 2022-01-20 ENCOUNTER — Other Ambulatory Visit: Payer: Self-pay

## 2022-01-20 DIAGNOSIS — Z952 Presence of prosthetic heart valve: Secondary | ICD-10-CM | POA: Diagnosis not present

## 2022-01-20 NOTE — Telephone Encounter (Signed)
Left message to call cardiac rehab.Barnet Pall, RN,BSN ?01/20/2022 1:13 PM  ?

## 2022-01-20 NOTE — Telephone Encounter (Signed)
-----   Message from Curt Bears, MD sent at 01/20/2022 12:13 PM EDT ----- ?Regarding: RE: retrun to cardiac rehab ?From the hematology standpoint, it is ok with me for her to resume her rehab.  I do know if there is any other issues from her PCP or cardiology.  Thank you. ?----- Message ----- ?From: Magda Kiel, RN ?Sent: 01/20/2022  12:01 PM EDT ?To: Curt Bears, MD ?Subject: retrun to cardiac rehab                       ? ?Good afternoon Dr Julien Nordmann, ? ?Mrs Madie Reno went to the ED as requested on 01/17/22. I wanted to ask if Mrs Valentina Gu is okay to return to exercise at cardiac rehab?  I spoke to Mrs Flannigan she says she still feels tired but otherwise feels okay. ? ? ?Thanks for your input! ? ?Sincerely, ?Barnet Pall RN ?Cardiac Rehab ? ? ?

## 2022-01-20 NOTE — Progress Notes (Signed)
Pat returned to exercise at cardiac rehab per Dr Julien Nordmann and exercised without difficulty today.Barnet Pall, RN,BSN ?01/20/2022 4:01 PM  ?

## 2022-01-20 NOTE — Telephone Encounter (Signed)
Spoke with Mrs Sydney Reid will get clearance from Dr Julien Nordmann as she went to the ED for evaluation on 01/20/22 as requested. Mrs Sydney Reid says she still feels tired. Will get clearance from Dr Julien Nordmann before [name] returns to exercise.Barnet Pall, RN,BSN ?01/20/2022 11:54 AM  ?

## 2022-01-20 NOTE — Telephone Encounter (Signed)
Left message to call cardiac rehab.Lumi Winslett Walden Jovi Alvizo RN BSN  

## 2022-01-22 ENCOUNTER — Encounter (HOSPITAL_COMMUNITY)
Admission: RE | Admit: 2022-01-22 | Discharge: 2022-01-22 | Disposition: A | Discharge status: Home / Self Care | Payer: Medicare Other | Source: Ambulatory Visit | Attending: Cardiology | Admitting: Cardiology

## 2022-01-22 ENCOUNTER — Ambulatory Visit (HOSPITAL_COMMUNITY): Payer: Medicare Other

## 2022-01-22 DIAGNOSIS — Z952 Presence of prosthetic heart valve: Secondary | ICD-10-CM

## 2022-01-24 ENCOUNTER — Ambulatory Visit (HOSPITAL_COMMUNITY): Payer: Medicare Other

## 2022-01-24 ENCOUNTER — Encounter (HOSPITAL_COMMUNITY)
Admission: RE | Admit: 2022-01-24 | Discharge: 2022-01-24 | Disposition: A | Discharge status: Home / Self Care | Payer: Medicare Other | Source: Ambulatory Visit | Attending: Cardiology | Admitting: Cardiology

## 2022-01-24 DIAGNOSIS — Z952 Presence of prosthetic heart valve: Secondary | ICD-10-CM

## 2022-01-27 ENCOUNTER — Ambulatory Visit (HOSPITAL_COMMUNITY): Payer: Medicare Other

## 2022-01-27 ENCOUNTER — Encounter (HOSPITAL_COMMUNITY)
Admission: RE | Admit: 2022-01-27 | Discharge: 2022-01-27 | Disposition: A | Discharge status: Home / Self Care | Payer: Medicare Other | Source: Ambulatory Visit | Attending: Cardiology | Admitting: Cardiology

## 2022-01-27 DIAGNOSIS — Z952 Presence of prosthetic heart valve: Secondary | ICD-10-CM | POA: Diagnosis not present

## 2022-01-27 DIAGNOSIS — Z48812 Encounter for surgical aftercare following surgery on the circulatory system: Secondary | ICD-10-CM | POA: Insufficient documentation

## 2022-01-29 ENCOUNTER — Encounter (HOSPITAL_COMMUNITY): Payer: Medicare Other

## 2022-01-29 ENCOUNTER — Ambulatory Visit (HOSPITAL_COMMUNITY): Payer: Medicare Other

## 2022-01-30 ENCOUNTER — Ambulatory Visit (INDEPENDENT_AMBULATORY_CARE_PROVIDER_SITE_OTHER): Payer: Medicare Other | Admitting: Pulmonary Disease

## 2022-01-30 ENCOUNTER — Encounter: Payer: Self-pay | Admitting: Pulmonary Disease

## 2022-01-30 VITALS — BP 124/76 | HR 89 | Temp 98.4°F | Ht 65.0 in | Wt 144.0 lb

## 2022-01-30 DIAGNOSIS — G4730 Sleep apnea, unspecified: Secondary | ICD-10-CM

## 2022-01-30 DIAGNOSIS — I6523 Occlusion and stenosis of bilateral carotid arteries: Secondary | ICD-10-CM | POA: Diagnosis not present

## 2022-01-30 DIAGNOSIS — G4733 Obstructive sleep apnea (adult) (pediatric): Secondary | ICD-10-CM | POA: Diagnosis not present

## 2022-01-30 NOTE — Assessment & Plan Note (Signed)
Last sleep consultation suggests BiPAP settings of 10/8 cm.  However she seems to be set at 10/4 cm. ?Review of BiPAP download shows residual obstructive events with AHI 22/hour, obstructives 15/hour and central 6/hour.  She has excellent compliance and minimal leak. ?Ideally I would increase her EPAP to 8 cm and see if obstructive events are better treated but I am concerned that her central events would increase. ?We will proceed with a formal titration study and see if BiPAP is even required and whether CPAP would be sufficient to control events especially with an EPR feature.  Based on this, we will try to obtain a new machine for her and renew her CPAP supplies.  She is very compliant and PAPS certainly helped improve her daytime somnolence and fatigue ? ?We also discussed alternative therapy including hypoglossal nerve implant ?

## 2022-01-30 NOTE — Progress Notes (Signed)
? ?Subjective:  ? ? Patient ID: Sydney Reid, female    DOB: 07-11-1941, 81 y.o.   MRN: [id number] ? ?HPI ? ? ?Chief Complaint  ?Patient presents with  ? Follow-up  ?  Sleep consult. Last saw dr Halford Chessman in 2019. Pt is currently on cpap machine. Pt states she needs a new machine and that there is a message that pops up saying life time of battery exceeded.   ? ?81 year old woman presents to establish care for OSA. ?Her husband Ronalee Belts is my patient for OSA.  She is getting an error message on her BiPAP machine that is saying that the lifetime of the motor has been exceeded.  I have reviewed previous consultation with Dr. Irene Pap in 2019.  It seems that OSA was diagnosed in 2004 with a sleep study that showed severe OSA with AHI 27/hour.  For some reason she was placed on a BiPAP, she has no memory of being on a CPAP set presume this was based on her titration study.  She is settled down with nasal pillows she reports low energy over the last 3 weeks but overall Does tolerate CPAP well although she feels this is a nuisance ? ?Epworth sleepiness score is 2 and she denies daytime somnolence and fatigue.  She has an active lifestyle. ?Bedtime is between 11 PM and midnight, sleep latency is minimal, she sleeps with pillow which she uses as a neck roll, reports 2-3 nocturnal awakenings and is out of bed by 7:30 AM feeling rested without dryness of mouth or headaches. ? ?There is no history suggestive of cataplexy, sleep paralysis or parasomnias ? ?PMH -psoriatic arthritis ?TAVR 07/2021 ?Polycythemia vera status post phlebotomy ?Hypothyroidism ? ? ? ?Significant tests/ events reviewed ? ?NPSG 05/2001 - 150 lbs _RDI 27/hour ? ? ?Past Surgical History:  ?Procedure Laterality Date  ? CARDIAC CATHETERIZATION    ? CATARACT EXTRACTION W/ INTRAOCULAR LENS IMPLANT Bilateral   ? FOOT SURGERY    ? fused arch in left foot  ? GROIN DISSECTION  08/06/2021  ? Procedure: GROIN EXPLORATION ;  Surgeon: Waynetta Sandy, MD;  Location: [facility];  Service: Vascular;;  ? INTRAOPERATIVE TRANSTHORACIC ECHOCARDIOGRAM N/A 08/06/2021  ? Procedure: INTRAOPERATIVE TRANSTHORACIC ECHOCARDIOGRAM;  Surgeon: Early Osmond, MD;  Location: [facility];  Service: Cardiovascular;  Laterality: N/A;  ? LOWER EXTREMITY ANGIOGRAM  08/06/2021  ? Procedure: LEFT LOWER EXTREMITY ANGIOGRAM;  Surgeon: Early Osmond, MD;  Location: [facility];  Service: Cardiovascular;;  ? PATCH ANGIOPLASTY  08/06/2021  ? Procedure: PATCH ANGIOPLASTY OF FEMORAL ARTERY USING LEFT GREATER SAPHENOUS VEIN;  Surgeon: Waynetta Sandy, MD;  Location: [facility];  Service: Vascular;;  ? RIGHT/LEFT HEART CATH AND CORONARY ANGIOGRAPHY N/A 04/16/2021  ? Procedure: RIGHT/LEFT HEART CATH AND CORONARY ANGIOGRAPHY;  Surgeon: Adrian Prows, MD;  Location: [facility];  Service: Cardiovascular;  Laterality: N/A;  ? SPINE SURGERY    ? TONSILLECTOMY    ? TOTAL KNEE ARTHROPLASTY Left   ? TRANSCATHETER AORTIC VALVE REPLACEMENT, TRANSFEMORAL N/A 08/06/2021  ? Procedure: TRANSCATHETER AORTIC VALVE REPLACEMENT, TRANSFEMORAL;  Surgeon: Early Osmond, MD;  Location: [facility];  Service: Cardiovascular;  Laterality: N/A;  ? ? ?Past Medical History:  ?Diagnosis Date  ? Complication of anesthesia   ? Difficult intubation   ? 20-25 years ago  ? H/O total knee replacement 10/28/1999  ? Right  ? High cholesterol   ? History of tonsillectomy and adenoidectomy 10/27/1944  ? History of transcatheter aortic valve replacement (  TAVR) 08/06/2021  ? s/p Edwards 86m S3 vis TF approach with Dr. CBurt Knackand Dr. BCyndia Bent ? [place]  ? Sleep apnea with use of continuous positive airway pressure (CPAP) 10/27/2002  ? Thyroid disease   ? ? ?Allergies  ?Allergen Reactions  ? Codeine Nausea And Vomiting  ?  Weakness and dysequilibrium  ? Statins Hives  ?  Myalgias ?  ? ? ?Social History  ? ?Socioeconomic History  ? Marital status: Married  ?  Spouse name: Not on file  ? Number of children: 0  ? Years of education: 162  ? Highest education level: Bachelor's degree (e.g., BA, AB, BS)  ?Occupational History  ? Not on file  ?Tobacco Use  ? Smoking status: Former  ?  Packs/day: 2.00  ?  Years: 18.00  ?  Pack years: 36.00  ?  Types: Cigarettes  ?  Quit date: 165 ?  Years since quitting: 48.2  ? Smokeless tobacco: Never  ?Vaping Use  ? Vaping Use: Never used  ?Substance and Sexual Activity  ? Alcohol use: Yes  ?  Alcohol/week: 1.0 - 2.0 standard drink  ?  Types: 1 - 2 Standard drinks or equivalent per week  ?  Comment: Social drinker, couple times a week  ? Drug use: No  ? Sexual activity: Not on file  ?Other Topics Concern  ? Not on file  ?Social History Narrative  ? Diet? Normal  ?   ? Do you drink/eat things with caffeine? Yes, 1 mt. Dew per day  ?   ? Marital status?              m                      What year were you married? 1988  ?   ? Do you live in a house, apartment, assisted living, condo, trailer, etc.? Townhouse  ?   ? Is it one or more stories? 2 story  ?   ? How many persons live in your home? 2  ?   ? Do you have any pets in your home? (please list) no  ?   ? Current or past profession: Is manager  ?   ? Do you exercise?     No (did it for 50 years)                       Type & how often?  ?   ? Do you have a living will? yes  ?   ? Do you have a DNR form?        yes                          If not, do you want to discuss one?  ?   ? Do you have signed POA/HPOA for forms?   ?   ? ?Social Determinants of Health  ? ?Financial Resource Strain: Not on file  ?Food Insecurity: Not on file  ?Transportation Needs: Not on file  ?Physical Activity: Not on file  ?Stress: Not on file  ?Social Connections: Not on file  ?Intimate Partner Violence: Not on file  ? ? ?Family History  ?Problem Relation Age of Onset  ? Heart attack Father   ? Heart disease Sister   ? ? ? ? ? ? ?Review of Systems ? ?Joint stiffness ?Shortness of breath with  activity ? ?Constitutional: negative for anorexia, fevers and sweats  ?Eyes: negative for  irritation, redness and visual disturbance  ?Ears, nose, mouth, throat, and face: negative for earaches, epistaxis, nasal congestion and sore throat  ?Respiratory: negative for cough, dyspnea on exertion, sputum and wheezing  ?Cardiovascular: negative for chest pain, dyspnea, lower extremity edema, orthopnea, palpitations and syncope  ?Gastrointestinal: negative for abdominal pain, constipation, diarrhea, melena, nausea and vomiting  ?Genitourinary:negative for dysuria, frequency and hematuria  ?Hematologic/lymphatic: negative for bleeding, easy bruising and lymphadenopathy  ?Musculoskeletal:negative for arthralgias, muscle weakness  ?Neurological: negative for coordination problems, gait problems, headaches and weakness  ?Endocrine: negative for diabetic symptoms including polydipsia, polyuria and weight loss ? ?   ?Objective:  ? Physical Exam ? ? ?Gen. Pleasant, thin, frail appearing in no distress, normal affect ?ENT - no pallor,icterus, no post nasal drip ?Neck: No JVD, no thyromegaly, no carotid bruits ?Lungs: no use of accessory muscles, no dullness to percussion, clear without rales or rhonchi  ?Cardiovascular: Rhythm regular, heart sounds  normal, no murmurs or gallops, no peripheral edema ?Abdomen: soft and non-tender, no hepatosplenomegaly, BS normal. ?Musculoskeletal: Hand joint deformities, no cyanosis or clubbing ?Neuro:  alert, non focal ? ? ? ? ?   ?Assessment & Plan:  ? ? ?

## 2022-01-30 NOTE — Patient Instructions (Signed)
?  X PAP titration study ? ?Based on this we will get you a new machine ?

## 2022-01-31 ENCOUNTER — Encounter (HOSPITAL_COMMUNITY): Payer: Medicare Other

## 2022-01-31 ENCOUNTER — Ambulatory Visit (HOSPITAL_COMMUNITY): Payer: Medicare Other

## 2022-02-03 ENCOUNTER — Encounter (HOSPITAL_COMMUNITY)
Admission: RE | Admit: 2022-02-03 | Discharge: 2022-02-03 | Disposition: A | Discharge status: Home / Self Care | Payer: Medicare Other | Source: Ambulatory Visit | Attending: Cardiology | Admitting: Cardiology

## 2022-02-03 ENCOUNTER — Ambulatory Visit (HOSPITAL_COMMUNITY): Payer: Medicare Other

## 2022-02-03 DIAGNOSIS — Z952 Presence of prosthetic heart valve: Secondary | ICD-10-CM

## 2022-02-03 DIAGNOSIS — Z48812 Encounter for surgical aftercare following surgery on the circulatory system: Secondary | ICD-10-CM | POA: Diagnosis not present

## 2022-02-04 NOTE — Progress Notes (Signed)
Cardiac Individual Treatment Plan ? ?Patient Details  ?Name: Sydney Reid ?MRN: [id number] ?Date of Birth: 1941-04-28 ?Referring Provider:   ?Flowsheet Row CARDIAC REHAB PHASE II ORIENTATION from 01/02/2022 in [place]  ?Referring Provider Dr. Adrian Prows MD  ? ?  ? ? ?Initial Encounter Date:  ?Flowsheet Row CARDIAC REHAB PHASE II ORIENTATION from 01/02/2022 in [place]  ?Date 01/02/22  ? ?  ? ? ?Visit Diagnosis: 08/06/21 S/P TAVR ? ?Patient's Home Medications on Admission: ? ?Current Outpatient Medications:  ?  ammonium lactate (LAC-HYDRIN) 12 % lotion, Apply 1 application topically as needed for dry skin., Disp: , Rfl:  ?  amoxicillin (AMOXIL) 500 MG tablet, Take 1 tablet (500 mg total) by mouth as directed. Take 4 tablets by mouth 1 hour prior to dental work and cleanings (Patient not taking: Reported on 01/30/2022), Disp: 12 tablet, Rfl: 12 ?  aspirin EC 81 MG tablet, Take 81 mg by mouth daily. Swallow whole., Disp: , Rfl:  ?  Cholecalciferol (VITAMIN D3) 250 MCG (10000 UT) capsule, Take 4,000 Units by mouth daily., Disp: , Rfl:  ?  etanercept (ENBREL) 50 MG/ML injection, Inject 50 mg into the skin every Sunday. , Disp: , Rfl:  ?  ezetimibe (ZETIA) 10 MG tablet, Take 1 tablet (10 mg total) by mouth daily., Disp: 30 tablet, Rfl: 0 ?  HYDROcodone-acetaminophen (NORCO) 5-325 MG tablet, Take 1 tablet by mouth 2 (two) times daily., Disp: 60 tablet, Rfl: 0 ?  levothyroxine (SYNTHROID) 88 MCG tablet, Take one tablet by mouth once daily 30 minutes before breakfast on an empty stomach for thyroid., Disp: 90 tablet, Rfl: 3 ?  simvastatin (ZOCOR) 10 MG tablet, TAKE 1 TABLET BY MOUTH EVERYDAY AT BEDTIME, Disp: 90 tablet, Rfl: 3 ? ?Past Medical History: ?Past Medical History:  ?Diagnosis Date  ? Complication of anesthesia   ? Difficult intubation   ? 20-25 years ago  ? H/O total knee replacement 10/28/1999  ? Right  ? High cholesterol   ? History of tonsillectomy  and adenoidectomy 10/27/1944  ? History of transcatheter aortic valve replacement (TAVR) 08/06/2021  ? s/p Edwards 80m S3 vis TF approach with Dr. CBurt Knackand Dr. BCyndia Bent ? [place]atic [place]  ? Sleep apnea with use of continuous positive airway pressure (CPAP) 10/27/2002  ? Thyroid disease   ? ? ?Tobacco Use: ?Social History  ? ?Tobacco Use  ?Smoking Status Former  ? Packs/day: 2.00  ? Years: 18.00  ? Pack years: 36.00  ? Types: Cigarettes  ? Quit date: 125 ? Years since quitting: 48.3  ?Smokeless Tobacco Never  ? ? ?Labs: ?Review Flowsheet   ? ?  ?  Latest Ref Rng & Units 03/06/2021 04/16/2021 06/06/2021 08/02/2021  ?Labs for ITP Cardiac and Pulmonary Rehab  ?Cholestrol 100 - 199 mg/dL 193    155     ?LDL (calc) 0 - 99 mg/dL 114    87     ?HDL-C >39 mg/dL 55    48     ?Trlycerides 0 - 149 mg/dL 129    107     ?PH, Arterial 7.350 - 7.450  7.347    7.398    ?PCO2 arterial 32.0 - 48.0 mmHg  43.0    35.4    ?Bicarbonate 20.0 - 28.0 mmol/L  23.6    ? 23.2    ? 23.9    21.4    ?TCO2 22 - 32 mmol/L  25    ?  25    ? 25      ?Acid-base deficit 0.0 - 2.0 mmol/L  2.0    ? 3.0    ? 2.0    2.7    ?O2 Saturation %  100.0    ? 78.0    ? 78.0    98.3    ? ?  08/06/2021  ?Labs for ITP Cardiac and Pulmonary Rehab  ?Cholestrol   ?LDL (calc)   ?HDL-C   ?Trlycerides   ?PH, Arterial   ?PCO2 arterial   ?Bicarbonate   ?TCO2 25    ?Acid-base deficit   ?O2 Saturation   ?  ? ? Multiple values from one day are sorted in reverse-chronological order  ?  ?  ? ? ?Capillary Blood Glucose: ?No results found for: GLUCAP ? ? ?Exercise Target Goals: ?Exercise Program Goal: ?Individual exercise prescription set using results from initial 6 min walk test and THRR while considering  patient?s activity barriers and safety.  ? ?Exercise Prescription Goal: ?Starting with aerobic activity 30 plus minutes a day, 3 days per week for initial exercise prescription. Provide home exercise prescription and guidelines that participant acknowledges  understanding prior to discharge. ? ?Activity Barriers & Risk Stratification: ? Activity Barriers & Cardiac Risk Stratification - 01/02/22 1454   ? ?  ? Activity Barriers & Cardiac Risk Stratification  ? Activity Barriers Neck/Spine Problems;Arthritis;Back Problems;Joint Problems;Balance Concerns;Deconditioning;History of Falls;Right Knee Replacement;Shortness of Breath   ? Cardiac Risk Stratification High   ? ?  ?  ? ?  ? ? ?6 Minute Walk: ? 6 Minute Walk   ? ? [place] Name 01/02/22 1450  ?  ?  ?  ? 6 Minute Walk  ? Phase Initial    ? Distance 600 feet    ? Walk Time 6 minutes    ? # of Rest Breaks 2  2.20-2-55 and 5.46-6.00 due to high HR    ? MPH 1.14    ? METS 1.54    ? RPE 13    ? Perceived Dyspnea  2    ? VO2 Peak 5.4    ? Symptoms Yes (comment)    ? Comments 5/10 chronic back pain, relieved with rest. 2 breaks due to elevated HR    ? Resting HR 68 bpm    ? Resting BP 106/70    ? Resting Oxygen Saturation  99 %    ? Exercise Oxygen Saturation  during 6 min walk 99 %    ? Max Ex. HR 133 bpm    ? Max Ex. BP 144/70    ? 2 Minute Post BP 106/68    ? ?  ?  ? ?  ? ? ?Oxygen Initial Assessment: ? ? ?Oxygen Re-Evaluation: ? ? ?Oxygen Discharge (Final Oxygen Re-Evaluation): ? ? ?Initial Exercise Prescription: ? Initial Exercise Prescription - 01/02/22 1400   ? ?  ? Date of Initial Exercise RX and Referring Provider  ? Date 01/02/22   ? Referring Provider Dr. Adrian Prows MD   ? Expected Discharge Date 03/07/22   ?  ? NuStep  ? Level 1   ? SPM 60   ? METs 1   ?  ? Prescription Details  ? Frequency (times per week) 3   ? Duration Progress to 30 minutes of continuous aerobic without signs/symptoms of physical distress   ?  ? Intensity  ? THRR 40-80% of Max Heartrate 56-111   ? Ratings of Perceived Exertion 11-13   ? Perceived Dyspnea  0-4   ?  ? Progression  ? Progression Continue progressive overload as per policy without signs/symptoms or physical distress.   ?  ? Resistance Training  ? Training Prescription No   Pt unable to  grip. Will seek out assistive wts  ? ?  ?  ? ?  ? ? ?Perform Capillary Blood Glucose checks as needed. ? ?Exercise Prescription Changes: ? ? Exercise Prescription Changes   ? ? [place] Name 01/06/22 1615 01/27/22 1648  ?  ?  ?  ?  ? Response to Exercise  ? Blood Pressure (Admit) 128/72 110/76     ? Blood Pressure (Exercise) 152/78 170/86     ? Blood Pressure (Exit) 102/60 116/78     ? Heart Rate (Admit) 100 bpm 89 bpm     ? Heart Rate (Exercise) 127 bpm 135 bpm     ? Heart Rate (Exit) 96 bpm 89 bpm     ? Rating of Perceived Exertion (Exercise) 9 12     ? Perceived Dyspnea (Exercise) 0 0     ? Symptoms HR exceeded THRR during warmup and stretches, will have pt seated going farward. Some occasional dizziness HR exceeded THRR. Some occasional dizziness     ? Comments Pt first day of exercise in the crp2 program Reviewed MET's and goals     ? Duration Progress to 30 minutes of  aerobic without signs/symptoms of physical distress Progress to 30 minutes of  aerobic without signs/symptoms of physical distress     ? Intensity THRR New THRR New     ?  ? Progression  ? Progression Continue to progress workloads to maintain intensity without signs/symptoms of physical distress. Continue to progress workloads to maintain intensity without signs/symptoms of physical distress.     ? Average METs 3.5 4.1     ?  ? Resistance Training  ? Training Prescription Yes  Pt unable to grip. Acquired adaptive wts Yes  Pt unable to grip. Acquired adaptive wts     ? Weight 1 1     ? Reps 10-15 10-15     ? Time 10 Minutes 10 Minutes     ?  ? NuStep  ? Level 2 3     ? SPM 60 80     ? METs 3.5 4.1     ? ?  ?  ? ?  ? ? ?Exercise Comments: ? ? Exercise Comments   ? ? [place] Name 01/06/22 1625 01/27/22 1653  ?  ?  ?  ? Exercise Comments Pt first day in the CRP2 program. Pt tolerated exercise well with an average MET level of 3.5. Acquired adaptive 1 lb wts for pt to use. Dr. Einar Gip approved new THRR of 125 (90% of THR). Pt exceeded her new THRR during  standing stretches and warm up, will have her do seated exercise going forward. Some mild dizziness that was resolved with rest. Overall off to a good start. Reviewed MET's goals and Home ExRx. Pt tolerated

## 2022-02-05 ENCOUNTER — Ambulatory Visit (HOSPITAL_COMMUNITY): Payer: Medicare Other

## 2022-02-05 ENCOUNTER — Encounter (HOSPITAL_COMMUNITY): Payer: Medicare Other

## 2022-02-05 DIAGNOSIS — L603 Nail dystrophy: Secondary | ICD-10-CM | POA: Diagnosis not present

## 2022-02-05 DIAGNOSIS — B351 Tinea unguium: Secondary | ICD-10-CM | POA: Diagnosis not present

## 2022-02-05 DIAGNOSIS — L4052 Psoriatic arthritis mutilans: Secondary | ICD-10-CM | POA: Diagnosis not present

## 2022-02-05 DIAGNOSIS — M792 Neuralgia and neuritis, unspecified: Secondary | ICD-10-CM | POA: Diagnosis not present

## 2022-02-05 DIAGNOSIS — I739 Peripheral vascular disease, unspecified: Secondary | ICD-10-CM | POA: Diagnosis not present

## 2022-02-05 DIAGNOSIS — L84 Corns and callosities: Secondary | ICD-10-CM | POA: Diagnosis not present

## 2022-02-07 ENCOUNTER — Ambulatory Visit (HOSPITAL_COMMUNITY): Payer: Medicare Other

## 2022-02-07 ENCOUNTER — Encounter (HOSPITAL_COMMUNITY): Payer: Medicare Other

## 2022-02-07 DIAGNOSIS — H18513 Endothelial corneal dystrophy, bilateral: Secondary | ICD-10-CM | POA: Diagnosis not present

## 2022-02-10 ENCOUNTER — Encounter (HOSPITAL_COMMUNITY)
Admission: RE | Admit: 2022-02-10 | Discharge: 2022-02-10 | Disposition: A | Discharge status: Home / Self Care | Payer: Medicare Other | Source: Ambulatory Visit | Attending: Cardiology | Admitting: Cardiology

## 2022-02-10 ENCOUNTER — Ambulatory Visit (HOSPITAL_COMMUNITY): Payer: Medicare Other

## 2022-02-10 DIAGNOSIS — Z952 Presence of prosthetic heart valve: Secondary | ICD-10-CM | POA: Diagnosis not present

## 2022-02-10 DIAGNOSIS — Z48812 Encounter for surgical aftercare following surgery on the circulatory system: Secondary | ICD-10-CM | POA: Diagnosis not present

## 2022-02-11 ENCOUNTER — Telehealth: Payer: Self-pay | Admitting: Internal Medicine

## 2022-02-11 NOTE — Telephone Encounter (Signed)
Called patient regarding upcoming appointments, patient is notified. 

## 2022-02-12 ENCOUNTER — Encounter (HOSPITAL_COMMUNITY): Payer: Medicare Other

## 2022-02-12 ENCOUNTER — Ambulatory Visit (HOSPITAL_COMMUNITY): Payer: Medicare Other

## 2022-02-14 ENCOUNTER — Ambulatory Visit (HOSPITAL_COMMUNITY): Payer: Medicare Other

## 2022-02-14 ENCOUNTER — Encounter (HOSPITAL_COMMUNITY)
Admission: RE | Admit: 2022-02-14 | Discharge: 2022-02-14 | Disposition: A | Discharge status: Home / Self Care | Payer: Medicare Other | Source: Ambulatory Visit | Attending: Cardiology | Admitting: Cardiology

## 2022-02-14 DIAGNOSIS — Z48812 Encounter for surgical aftercare following surgery on the circulatory system: Secondary | ICD-10-CM | POA: Diagnosis not present

## 2022-02-14 DIAGNOSIS — Z952 Presence of prosthetic heart valve: Secondary | ICD-10-CM

## 2022-02-14 NOTE — Progress Notes (Signed)
CARDIAC REHAB PHASE 2 ? ?Reviewed home exercise with pt today. Pt is tolerating exercise well. Pt will continue to exercise on their own by following youtube videos and walking stairs for 30-45 minutes per session 4 days a week in addition to the 3 days in CRP2. Advised pt on THRR, RPE scale, hydration and temperature/humidity precautions. Reinforced S/S to stop exercise and when to call MD vs 911. Encouraged warm up cool down and stretches with exercise sessions. Pt verbalized understanding, all questions were answered and pt was given a copy to take home.  ?  ?Kirby Funk ACSM-CEP ?02/14/2022 ?4:26 PM ? ?

## 2022-02-17 ENCOUNTER — Ambulatory Visit (HOSPITAL_COMMUNITY): Payer: Medicare Other

## 2022-02-17 ENCOUNTER — Encounter (HOSPITAL_COMMUNITY)
Admission: RE | Admit: 2022-02-17 | Discharge: 2022-02-17 | Disposition: A | Discharge status: Home / Self Care | Payer: Medicare Other | Source: Ambulatory Visit | Attending: Cardiology | Admitting: Cardiology

## 2022-02-17 DIAGNOSIS — Z952 Presence of prosthetic heart valve: Secondary | ICD-10-CM

## 2022-02-17 DIAGNOSIS — Z48812 Encounter for surgical aftercare following surgery on the circulatory system: Secondary | ICD-10-CM | POA: Diagnosis not present

## 2022-02-18 ENCOUNTER — Other Ambulatory Visit: Payer: Self-pay | Admitting: *Deleted

## 2022-02-18 ENCOUNTER — Telehealth: Payer: Self-pay | Admitting: Pulmonary Disease

## 2022-02-18 DIAGNOSIS — L4052 Psoriatic arthritis mutilans: Secondary | ICD-10-CM

## 2022-02-18 DIAGNOSIS — G894 Chronic pain syndrome: Secondary | ICD-10-CM

## 2022-02-18 MED ORDER — HYDROCODONE-ACETAMINOPHEN 5-325 MG PO TABS: 1.0000 | ORAL_TABLET | Freq: Two times a day (BID) | ORAL | 0 refills | Status: DC

## 2022-02-18 MED ORDER — EZETIMIBE 10 MG PO TABS: 10.0000 mg | ORAL_TABLET | Freq: Every day | ORAL | 1 refills | Status: DC

## 2022-02-18 NOTE — Telephone Encounter (Signed)
Patient called and left message on Clinical intake requesting refills.  ?Epic LR: 12/16/2021 ?Contract Date: 02/18/2021 ? ?Called patient and LMOM to return call to schedule follow up appointment.  ?Last Seen 09/02/21 (last OV stated to return in 6 months-Sabre Romberger) ? ?Pended Rx's and sent to [facility] for approval.  ?

## 2022-02-18 NOTE — Telephone Encounter (Signed)
Patient called back and scheduled an appointment with Sydney Reid for 03/17/2022 ?

## 2022-02-19 ENCOUNTER — Ambulatory Visit (HOSPITAL_COMMUNITY): Payer: Medicare Other

## 2022-02-19 ENCOUNTER — Encounter (HOSPITAL_COMMUNITY)
Admission: RE | Admit: 2022-02-19 | Discharge: 2022-02-19 | Disposition: A | Discharge status: Home / Self Care | Payer: Medicare Other | Source: Ambulatory Visit | Attending: Cardiology | Admitting: Cardiology

## 2022-02-19 DIAGNOSIS — Z48812 Encounter for surgical aftercare following surgery on the circulatory system: Secondary | ICD-10-CM | POA: Diagnosis not present

## 2022-02-19 DIAGNOSIS — Z952 Presence of prosthetic heart valve: Secondary | ICD-10-CM

## 2022-02-19 NOTE — Telephone Encounter (Signed)
Called the pt and had to LMTCB.  °

## 2022-02-21 ENCOUNTER — Encounter (HOSPITAL_COMMUNITY)
Admission: RE | Admit: 2022-02-21 | Discharge: 2022-02-21 | Disposition: A | Discharge status: Home / Self Care | Payer: Medicare Other | Source: Ambulatory Visit | Attending: Cardiology | Admitting: Cardiology

## 2022-02-21 ENCOUNTER — Ambulatory Visit (HOSPITAL_COMMUNITY): Payer: Medicare Other

## 2022-02-21 DIAGNOSIS — Z952 Presence of prosthetic heart valve: Secondary | ICD-10-CM

## 2022-02-21 DIAGNOSIS — Z48812 Encounter for surgical aftercare following surgery on the circulatory system: Secondary | ICD-10-CM | POA: Diagnosis not present

## 2022-02-24 ENCOUNTER — Encounter (HOSPITAL_COMMUNITY)
Admission: RE | Admit: 2022-02-24 | Discharge: 2022-02-24 | Disposition: A | Discharge status: Home / Self Care | Payer: Medicare Other | Source: Ambulatory Visit | Attending: Cardiology | Admitting: Cardiology

## 2022-02-24 ENCOUNTER — Ambulatory Visit (HOSPITAL_COMMUNITY): Payer: Medicare Other

## 2022-02-24 DIAGNOSIS — Z952 Presence of prosthetic heart valve: Secondary | ICD-10-CM | POA: Insufficient documentation

## 2022-02-26 ENCOUNTER — Encounter (HOSPITAL_COMMUNITY)
Admission: RE | Admit: 2022-02-26 | Discharge: 2022-02-26 | Disposition: A | Discharge status: Home / Self Care | Payer: Medicare Other | Source: Ambulatory Visit | Attending: Cardiology | Admitting: Cardiology

## 2022-02-26 ENCOUNTER — Ambulatory Visit (HOSPITAL_COMMUNITY): Payer: Medicare Other

## 2022-02-26 DIAGNOSIS — Z952 Presence of prosthetic heart valve: Secondary | ICD-10-CM | POA: Diagnosis not present

## 2022-02-28 ENCOUNTER — Encounter (HOSPITAL_COMMUNITY)
Admission: RE | Admit: 2022-02-28 | Discharge: 2022-02-28 | Disposition: A | Discharge status: Home / Self Care | Payer: Medicare Other | Source: Ambulatory Visit | Attending: Cardiology | Admitting: Cardiology

## 2022-02-28 ENCOUNTER — Ambulatory Visit (HOSPITAL_COMMUNITY): Payer: Medicare Other

## 2022-02-28 VITALS — Ht 63.0 in | Wt 146.2 lb

## 2022-02-28 DIAGNOSIS — Z952 Presence of prosthetic heart valve: Secondary | ICD-10-CM | POA: Diagnosis not present

## 2022-02-28 NOTE — Progress Notes (Signed)
Discharge Progress Report ? ?Patient Details  ?Name: Sydney Reid ?MRN: [id number] ?Date of Birth: 1940-12-01 ?Referring Provider:   ?Flowsheet Row CARDIAC REHAB PHASE II ORIENTATION from 01/02/2022 in [place]  ?Referring Provider Dr. Adrian Prows MD  ? ?  ? ? ? ?Number of Visits: 18 ? ?Reason for Discharge:  ?Patient reached a stable level of exercise. ?Patient has met program and personal goals. ? ?Smoking History:  ?Social History  ? ?Tobacco Use  ?Smoking Status Former  ? Packs/day: 2.00  ? Years: 18.00  ? Pack years: 36.00  ? Types: Cigarettes  ? Quit date: 32  ? Years since quitting: 48.3  ?Smokeless Tobacco Never  ? ? ?Diagnosis:  ?08/06/21 S/P TAVR ? ?ADL UCSD: ? ? ?Initial Exercise Prescription: ? Initial Exercise Prescription - 01/02/22 1400   ? ?  ? Date of Initial Exercise RX and Referring Provider  ? Date 01/02/22   ? Referring Provider Dr. Adrian Prows MD   ? Expected Discharge Date 03/07/22   ?  ? NuStep  ? Level 1   ? SPM 60   ? METs 1   ?  ? Prescription Details  ? Frequency (times per week) 3   ? Duration Progress to 30 minutes of continuous aerobic without signs/symptoms of physical distress   ?  ? Intensity  ? THRR 40-80% of Max Heartrate 56-111   ? Ratings of Perceived Exertion 11-13   ? Perceived Dyspnea 0-4   ?  ? Progression  ? Progression Continue progressive overload as per policy without signs/symptoms or physical distress.   ?  ? Resistance Training  ? Training Prescription No   Pt unable to grip. Will seek out assistive wts  ? ?  ?  ? ?  ? ? ?Discharge Exercise Prescription (Final Exercise Prescription Changes): ? Exercise Prescription Changes - 02/28/22 1638   ? ?  ? Response to Exercise  ? Blood Pressure (Admit) 114/72   ? Blood Pressure (Exercise) 140/82   ? Blood Pressure (Exit) 104/70   ? Heart Rate (Admit) 94 bpm   ? Heart Rate (Exercise) 131 bpm   ? Heart Rate (Exit) 98 bpm   ? Rating of Perceived Exertion (Exercise) 10   ? Perceived Dyspnea  (Exercise) 0   ? Comments Pt graduated the CRP2 and walk test done today   ? Duration Progress to 30 minutes of  aerobic without signs/symptoms of physical distress   ? Intensity THRR New   ?  ? Progression  ? Progression Continue to progress workloads to maintain intensity without signs/symptoms of physical distress.   ? Average METs 3.4   ?  ? Resistance Training  ? Training Prescription Yes   Pt unable to grip. Acquired adaptive wts  ? Weight 1   ? Reps 10-15   ? Time 10 Minutes   ?  ? NuStep  ? Level 3   ? SPM 80   ? Minutes 18   ? METs 3.4   ?  ? Home Exercise Plan  ? Plans to continue exercise at Home (comment)   ? Frequency Add 4 additional days to program exercise sessions.   ? Initial Home Exercises Provided 02/14/22   ? ?  ?  ? ?  ? ? ?Functional Capacity: ? 6 Minute Walk   ? ? [place] Name 01/02/22 1450 02/28/22 1630  ?  ?  ? 6 Minute Walk  ? Phase Initial Discharge   ? Distance 600  feet 1365 feet   ? Distance % Change -- 127.5 %   ? Distance Feet Change -- 765 ft   ? Walk Time 6 minutes 6 minutes   ? # of Rest Breaks 2  2.20-2-55 and 5.46-6.00 due to high HR 0   ? MPH 1.14 2.59   ? METS 1.54 2.75   ? RPE 13 10   ? Perceived Dyspnea  2 1   ? VO2 Peak 5.4 9.64   ? Symptoms Yes (comment) Yes (comment)   ? Comments 5/10 chronic back pain, relieved with rest. 2 breaks due to elevated HR Dizzy post exercise. relieved with rest   ? Resting HR 68 bpm --   ? Resting BP 106/70 --   ? Resting Oxygen Saturation  99 % 98 %   ? Exercise Oxygen Saturation  during 6 min walk 99 % 99 %   ? Max Ex. HR 133 bpm 131 bpm   ? Max Ex. BP 144/70 140/82   ? 2 Minute Post BP 106/68 --   ? ?  ?  ? ?  ? ? ?Psychological, QOL, Others - Outcomes: ?PHQ 2/9: ? ?  02/28/2022  ?  4:09 PM 01/02/2022  ?  5:25 PM 08/01/2021  ?  2:06 PM 05/15/2020  ?  8:31 AM 10/17/2019  ?  2:14 PM  ?Depression screen PHQ 2/9  ?Decreased Interest 0 0 0 0 0  ?Down, Depressed, Hopeless 0 0 0 0 0  ?PHQ - 2 Score 0 0 0 0 0  ? ? ?Quality of Life: ? Quality of Life -  02/28/22 1630   ? ?  ? Quality of Life  ? Select Quality of Life   ?  ? Quality of Life Scores  ? Health/Function Post 27.1 %   ? Socioeconomic Post 28.33 %   ? Psych/Spiritual Post 26.57 %   ? Family Post 27.6 %   ? GLOBAL Post 27.29 %   ? ?  ?  ? ?  ? ? ?Personal Goals: ?Goals established at orientation with interventions provided to work toward goal. ? Personal Goals and Risk Factors at Admission - 01/02/22 1509   ? ?  ? Core Components/Risk Factors/Patient Goals on Admission  ?  Weight Management Yes;Weight Loss   ? Intervention Weight Management: Develop a combined nutrition and exercise program designed to reach desired caloric intake, while maintaining appropriate intake of nutrient and fiber, sodium and fats, and appropriate energy expenditure required for the weight goal.;Weight Management: Provide education and appropriate resources to help participant work on and attain dietary goals.   ? Admit Weight 145 lb 4.5 oz (65.9 kg)   ? Expected Outcomes Short Term: Continue to assess and modify interventions until short term weight is achieved;Long Term: Adherence to nutrition and physical activity/exercise program aimed toward attainment of established weight goal;Weight Loss: Understanding of general recommendations for a balanced deficit meal plan, which promotes 1-2 lb weight loss per week and includes a negative energy balance of [phone] kcal/d;Understanding recommendations for meals to include 15-35% energy as protein, 25-35% energy from fat, 35-60% energy from carbohydrates, less than 271m of dietary cholesterol, 20-35 gm of total fiber daily;Understanding of distribution of calorie intake throughout the day with the consumption of 4-5 meals/snacks   ? Lipids Yes   ? Intervention Provide education and support for participant on nutrition & aerobic/resistive exercise along with prescribed medications to achieve LDL <726m HDL >4036m  ? Expected Outcomes Short Term: Participant states understanding  of  desired cholesterol values and is compliant with medications prescribed. Participant is following exercise prescription and nutrition guidelines.;Long Term: Cholesterol controlled with medications as prescribed, with individualized exercise RX and with personalized nutrition plan. Value goals: LDL < 43m, HDL > 40 mg.   ? Personal Goal Other Yes   ? Personal Goal Short and long the same: control SOB and be able to dance without SOB   ? Intervention Will continue to monitor pt and progress workloads as tolerated without sign or symptom   ? Expected Outcomes Pt will achieve her goals with time and gradual progression   ? ?  ?  ? ?  ?  ? ?Personal Goals Discharge: ? Goals and Risk Factor Review   ? ? [place] 01/06/22 1555 02/04/22 1750 03/05/22 1747  ?  ?  ?  ? Core Components/Risk Factors/Patient Goals Review  ? Personal Goals Review Weight Management/Obesity;Lipids;Stress Weight Management/Obesity;Lipids;Stress Weight Management/Obesity;Lipids;Stress    ? Review Pat started cardiac rehab on 01/06/22. Pat did well with exercise for her fitness level as PFraser Dinis somewhat deconditoned PFraser Dinhas been enjoying exercise at phase 2 caridac rehab. PFraser Dinworks hard and sometimes still exceeds her target heart rate. Will continue to monitor. PFraser Dinhas been enjoying exercise at phase 2 cardiac rehab. Pat completed phase 2 cardiac rehab on 02/28/22    ? Expected Outcomes PFraser Dinwill continue to participate in phase 2 cardiac rehab for exercise, nutrition and lifestyle modifications PFraser Dinwill continue to participate in phase 2 cardiac rehab for exercise, nutrition and lifestyle modifications PFraser Dinwill continue to  exercise, follow  nutrition and lifestyle modifications upon completion of phase 2 cardiac rehab.    ? ?  ?  ? ?  ? ? ?Exercise Goals and Review: ? Exercise Goals   ? ? [place] 01/02/22 1459  ?  ?  ?  ?  ?  ? Exercise Goals  ? Increase Physical Activity Yes      ? Intervention Provide advice, education, support and counseling  about physical activity/exercise needs.;Develop an individualized exercise prescription for aerobic and resistive training based on initial evaluation findings, risk stratification, comorbidities and participant's person

## 2022-03-05 NOTE — Telephone Encounter (Signed)
Patient does not want to go through a titration study. ?Explained to patient that this was because she was placed on a BiPAP without clear explanation.  Her settings were supposed to be 10/8 but she has dropped this to 10/4 for comfort ?Her machine is now showing an end-of-life message. ?She is willing to continue using her machine. ?When she is ready for replacement, we can trial auto CPAP 5 to 10 cm first with EPR setting of 3 cm ? ?She was agreeable with this plan ?Please cancel sleep study ?

## 2022-03-07 NOTE — Telephone Encounter (Signed)
Sleep study has been canceled per Dr. Elsworth Soho nothing further needed at this time.  ?

## 2022-03-14 ENCOUNTER — Encounter (HOSPITAL_BASED_OUTPATIENT_CLINIC_OR_DEPARTMENT_OTHER): Payer: Medicare Other | Admitting: Pulmonary Disease

## 2022-03-17 ENCOUNTER — Ambulatory Visit (INDEPENDENT_AMBULATORY_CARE_PROVIDER_SITE_OTHER): Payer: Medicare Other | Admitting: Nurse Practitioner

## 2022-03-17 ENCOUNTER — Encounter: Payer: Self-pay | Admitting: Nurse Practitioner

## 2022-03-17 ENCOUNTER — Encounter: Payer: Self-pay | Admitting: Internal Medicine

## 2022-03-17 VITALS — BP 128/80 | HR 96 | Temp 97.1°F | Wt 147.0 lb

## 2022-03-17 DIAGNOSIS — D45 Polycythemia vera: Secondary | ICD-10-CM | POA: Diagnosis not present

## 2022-03-17 DIAGNOSIS — I350 Nonrheumatic aortic (valve) stenosis: Secondary | ICD-10-CM | POA: Diagnosis not present

## 2022-03-17 DIAGNOSIS — E039 Hypothyroidism, unspecified: Secondary | ICD-10-CM | POA: Diagnosis not present

## 2022-03-17 DIAGNOSIS — R42 Dizziness and giddiness: Secondary | ICD-10-CM | POA: Diagnosis not present

## 2022-03-17 DIAGNOSIS — I6523 Occlusion and stenosis of bilateral carotid arteries: Secondary | ICD-10-CM | POA: Diagnosis not present

## 2022-03-17 DIAGNOSIS — L4052 Psoriatic arthritis mutilans: Secondary | ICD-10-CM | POA: Diagnosis not present

## 2022-03-17 DIAGNOSIS — R2689 Other abnormalities of gait and mobility: Secondary | ICD-10-CM | POA: Diagnosis not present

## 2022-03-17 NOTE — Progress Notes (Unsigned)
Careteam: Patient Care Team: [name], NP as PCP - General (Geriatric Medicine) Tomma Rakers, MD as Referring Physician (Ophthalmology) Lahoma Rocker, MD as Consulting Physician (Rheumatology)  PLACE OF SERVICE:  [place] Directive information Does Patient Have a Medical Advance Directive?: Yes, Type of Advance Directive: [place];Living will, Does patient want to make changes to medical advance directive?: No - Patient declined  Allergies  Allergen Reactions   Codeine Nausea And Vomiting    Weakness and dysequilibrium   Statins Hives    Myalgias     Chief Complaint  Patient presents with   Medical Management of Chronic Issues    6 month follow-up. Discuss need for td/tdap or post pone if patient refuses. NCIR verified.       HPI: Patient is a 81 y.o. female for routine follow up.   She went to the ED in March to due overwhelming fatigue, workup was negative   S/p TAVR she completed cardiac rehab and feels like shortness of breath has improved.   Psoriatic arthritis with degenerative changes- continues on enbrel and hydrocodone daily as needed   OSA- on cpap.   Polycythemia vera- followed by hematology, phlebotomy in March. Having close follow up.   She reports she continues to be bothered by dizziness. Not positional all the time. Last a few seconds.   Continues on zeita for hyperlipidemia- lipids at goal on last labs.   Review of Systems:  Review of Systems  Constitutional:  Negative for chills, fever and weight loss.  HENT:  Negative for tinnitus.   Respiratory:  Negative for cough, sputum production and shortness of breath.   Cardiovascular:  Negative for chest pain, palpitations and leg swelling.  Gastrointestinal:  Negative for abdominal pain, constipation, diarrhea and heartburn.  Genitourinary:  Negative for dysuria, frequency and urgency.  Musculoskeletal:  Negative for back pain, falls, joint pain and  myalgias.  Skin: Negative.   Neurological:  Positive for dizziness. Negative for headaches.  Psychiatric/Behavioral:  Negative for depression and memory loss. The patient does not have insomnia.    Past Medical History:  Diagnosis Date   Complication of anesthesia    Difficult intubation    20-25 years ago   H/O total knee replacement 10/28/1999   Right   High cholesterol    History of tonsillectomy and adenoidectomy 10/27/1944   History of transcatheter aortic valve replacement (TAVR) 08/06/2021   s/p Edwards 46m S3 vis TF approach with Dr. CBurt Knackand Dr. BCyndia Bent  Psoriatic arthritis ([facility] 10/27/1988   Sleep apnea with use of continuous positive airway pressure (CPAP) 10/27/2002   Thyroid disease    Past Surgical History:  Procedure Laterality Date   CARDIAC CATHETERIZATION     CATARACT EXTRACTION W/ INTRAOCULAR LENS IMPLANT Bilateral    FOOT SURGERY     fused arch in left foot   GROIN DISSECTION  08/06/2021   Procedure: GVirl SonEXPLORATION ;  Surgeon: CWaynetta Sandy MD;  [place] [facility] [place]  Service: Vascular;;   INTRAOPERATIVE TRANSTHORACIC ECHOCARDIOGRAM N/A 08/06/2021   Procedure: INTRAOPERATIVE TRANSTHORACIC ECHOCARDIOGRAM;  Surgeon: TEarly Osmond MD;  [place] [facility]  Service: Cardiovascular;  Laterality: N/A;   LOWER EXTREMITY ANGIOGRAM  08/06/2021   Procedure: LEFT LOWER EXTREMITY ANGIOGRAM;  Surgeon: TEarly Osmond MD;  [place] [facility]  Service: Cardiovascular;;   PATCH ANGIOPLASTY  08/06/2021   Procedure: PATCH ANGIOPLASTY OF FEMORAL ARTERY USING LEFT GREATER SAPHENOUS VEIN;  Surgeon: CWaynetta Sandy  MD;  Location: [place];  Service: Vascular;;   RIGHT/LEFT HEART CATH AND CORONARY ANGIOGRAPHY N/A 04/16/2021   Procedure: RIGHT/LEFT HEART CATH AND CORONARY ANGIOGRAPHY;  Surgeon: Adrian Prows, MD;  Location: [place] [facility];  Service: Cardiovascular;  Laterality: N/A;   SPINE SURGERY     TONSILLECTOMY     TOTAL KNEE ARTHROPLASTY Left     TRANSCATHETER AORTIC VALVE REPLACEMENT, TRANSFEMORAL N/A 08/06/2021   Procedure: TRANSCATHETER AORTIC VALVE REPLACEMENT, TRANSFEMORAL;  Surgeon: Early Osmond, MD;  Location: [place] OR;  Service: Cardiovascular;  Laterality: N/A;   Social History:   reports that she quit smoking about 48 years ago. Her smoking use included cigarettes. She has a 36.00 pack-year smoking history. She has never used smokeless tobacco. She reports current alcohol use of about 1.0 - 2.0 standard drink per week. She reports that she does not use drugs.  Family History  Problem Relation Age of Onset   Heart attack Father    Heart disease Sister     Medications: Patient's Medications  New Prescriptions   No medications on file  Previous Medications   AMMONIUM LACTATE (LAC-HYDRIN) 12 % LOTION    Apply 1 application topically as needed for dry skin.   AMOXICILLIN (AMOXIL) 500 MG TABLET    Take 1 tablet (500 mg total) by mouth as directed. Take 4 tablets by mouth 1 hour prior to dental work and cleanings   ASPIRIN EC 81 MG TABLET    Take 81 mg by mouth daily. Swallow whole.   CHOLECALCIFEROL (VITAMIN D3) 250 MCG (10000 UT) CAPSULE    Take 4,000 Units by mouth daily.   ETANERCEPT (ENBREL) 50 MG/ML INJECTION    Inject 50 mg into the skin every Sunday.    EZETIMIBE (ZETIA) 10 MG TABLET    Take 1 tablet (10 mg total) by mouth daily.   HYDROCODONE-ACETAMINOPHEN (NORCO) 5-325 MG TABLET    Take 1 tablet by mouth 2 (two) times daily.   LEVOTHYROXINE (SYNTHROID) 88 MCG TABLET    Take one tablet by mouth once daily 30 minutes before breakfast on an empty stomach for thyroid.   SIMVASTATIN (ZOCOR) 10 MG TABLET    TAKE 1 TABLET BY MOUTH EVERYDAY AT BEDTIME  Modified Medications   No medications on file  Discontinued Medications   No medications on file    Physical Exam:  Vitals:   03/17/22 1326  BP: 128/80  Pulse: 96  Temp: (!) 97.1 F (36.2 C)  TempSrc: Temporal  SpO2: 99%  Weight: 147 lb (66.7 kg)   Body mass  index is 26.04 kg/m. Wt Readings from Last 3 Encounters:  03/17/22 147 lb (66.7 kg)  03/03/22 146 lb 2.6 oz (66.3 kg)  01/30/22 144 lb (65.3 kg)    Physical Exam Constitutional:      General: She is not in acute distress.    Appearance: She is well-developed. She is not diaphoretic.  HENT:     Head: Normocephalic and atraumatic.     Mouth/Throat:     Pharynx: No oropharyngeal exudate.  Eyes:     Conjunctiva/sclera: Conjunctivae normal.     Pupils: Pupils are equal, round, and reactive to light.  Cardiovascular:     Rate and Rhythm: Normal rate and regular rhythm.     Heart sounds: Normal heart sounds.  Pulmonary:     Effort: Pulmonary effort is normal.     Breath sounds: Normal breath sounds.  Abdominal:     General: Bowel sounds are normal.  Palpations: Abdomen is soft.  Musculoskeletal:     Cervical back: Normal range of motion and neck supple.     Right lower leg: No edema.     Left lower leg: No edema.  Skin:    General: Skin is warm and dry.  Neurological:     Mental Status: She is alert.  Psychiatric:        Mood and Affect: Mood normal.    Labs reviewed: Basic Metabolic Panel: Recent Labs    08/07/21 0143 12/30/21 1055 01/17/22 1602  NA 137 140 139  K 4.3 4.1 4.3  CL 109 106 106  CO2 20* 27 25  GLUCOSE 139* 90 83  BUN '10 15 12  '$ CREATININE 0.85 1.10* 0.99  CALCIUM 8.6* 9.9 9.7  TSH  --   --  0.577   Liver Function Tests: Recent Labs    08/02/21 1441 12/30/21 1055 01/17/22 1602  AST '29 20 19  '$ ALT '23 15 11  '$ ALKPHOS 59 69 64  BILITOT 0.9 0.5 0.5  PROT 7.8 8.2* 8.1  ALBUMIN 4.0 4.3 4.3   No results for input(s): LIPASE, AMYLASE in the last 8760 hours. No results for input(s): AMMONIA in the last 8760 hours. CBC: Recent Labs    05/16/21 1124 08/02/21 1441 08/07/21 0143 12/30/21 1055 01/17/22 1830  WBC 5.7   < > 8.3 7.3 6.1  NEUTROABS 3.4  --   --  4.4 3.5  HGB 14.3   < > 12.1 15.1* 13.3  HCT 45.2   < > 39.5 48.9* 44.2  MCV 82.9    < > 83.3 82.7 82.9  PLT 229   < > 188 197 218   < > = values in this interval not displayed.   Lipid Panel: Recent Labs    06/06/21 1012  CHOL 155  HDL 48  LDLCALC 87  TRIG 107   TSH: Recent Labs    01/17/22 1602  TSH 0.577   A1C: No results found for: HGBA1C   Assessment/Plan 1. Nonrheumatic aortic valve stenosis S/p TAVR, completed cardiac rehab. Symptoms stable.   2. Dizziness Ongoing, will have her try zyrtec to see if this help Also will get PT for vestibular rehab - Ambulatory referral to Physical Therapy  3. Imbalance Worsening mobility due to dizziness - Ambulatory referral to Physical Therapy  4. 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   5. Acquired hypothyroidism Tsh at goal in March, continues on synthroid daily   6. Polycythemia vera ([place]) Continues to follow up with hematology, continues on asa 81 mg daily     Return in about 6 months (around 09/17/2022) for routine follow up., sooner if needed:  Janett Billow K. [place], [facility] [phone]

## 2022-04-04 NOTE — Therapy (Signed)
OUTPATIENT PHYSICAL THERAPY VESTIBULAR EVALUATION     Patient Name: Sydney Reid MRN: [id number] DOB:06-18-1941, 81 y.o., female Today's Date: 04/04/2022  PCP: [name], NP  REFERRING PROVIDER: [name], NP     Past Medical History:  Diagnosis Date   Complication of anesthesia    Difficult intubation    20-25 years ago   H/O total knee replacement 10/28/1999   Right   High cholesterol    History of tonsillectomy and adenoidectomy 10/27/1944   History of transcatheter aortic valve replacement (TAVR) 08/06/2021   s/p Edwards 36m S3 vis TF approach with Dr. CBurt Knackand Dr. BCyndia Bent  Psoriatic arthritis ([facility] 10/27/1988   Sleep apnea with use of continuous positive airway pressure (CPAP) 10/27/2002   Thyroid disease    Past Surgical History:  Procedure Laterality Date   CARDIAC CATHETERIZATION     CATARACT EXTRACTION W/ INTRAOCULAR LENS IMPLANT Bilateral    FOOT SURGERY     fused arch in left foot   GROIN DISSECTION  08/06/2021   Procedure: GVirl SonEXPLORATION ;  Surgeon: CWaynetta Sandy MD;  [place] [facility] [facility]  Service: Vascular;;   INTRAOPERATIVE TRANSTHORACIC ECHOCARDIOGRAM N/A 08/06/2021   Procedure: INTRAOPERATIVE TRANSTHORACIC ECHOCARDIOGRAM;  Surgeon: TEarly Osmond MD;  [place]  Service: Cardiovascular;  Laterality: N/A;   LOWER EXTREMITY ANGIOGRAM  08/06/2021   Procedure: LEFT LOWER EXTREMITY ANGIOGRAM;  Surgeon: TEarly Osmond MD;  [place] [facility]  Service: Cardiovascular;;   PATCH ANGIOPLASTY  08/06/2021   Procedure: PATCH ANGIOPLASTY OF FEMORAL ARTERY USING LEFT GREATER SAPHENOUS VEIN;  Surgeon: CWaynetta Sandy MD;  [place] [facility]  Service: Vascular;;   RIGHT/LEFT HEART CATH AND CORONARY ANGIOGRAPHY N/A 04/16/2021   Procedure: RIGHT/LEFT HEART CATH AND CORONARY ANGIOGRAPHY;  Surgeon: GAdrian Prows MD;  [facility];  Service: Cardiovascular;  Laterality: N/A;   SPINE SURGERY      TONSILLECTOMY     TOTAL KNEE ARTHROPLASTY Left    TRANSCATHETER AORTIC VALVE REPLACEMENT, TRANSFEMORAL N/A 08/06/2021   Procedure: TRANSCATHETER AORTIC VALVE REPLACEMENT, TRANSFEMORAL;  Surgeon: TEarly Osmond MD;  [facility]  Service: Cardiovascular;  Laterality: N/A;   Patient Active Problem List   Diagnosis Date Noted   Epiretinal membrane (ERM) of right eye 08/28/2021   Fuchs' corneal dystrophy 08/28/2021   Peripheral vascular disease ([facility] 08/28/2021   Cataract, bilateral 08/28/2021   Unspecified atherosclerosis of native arteries of extremities, bilateral legs ([facility] 08/28/2021   S/P TAVR (transcatheter aortic valve replacement) 08/06/2021   Asymptomatic bilateral carotid artery stenosis 05/09/2021   Severe aortic stenosis 04/16/2021   Polycythemia vera ([facility] 02/14/2021   Osteopenia 01/02/2021   Pain in right knee 07/04/2020   Hypothyroidism due to acquired atrophy of thyroid 05/12/2018   Dizziness and giddiness 05/12/2018   Closed fracture of proximal end of left humerus 02/25/2018   Chronic pain syndrome 07/08/2017   Psoriatic arthritis, 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[place] 07/08/2017   Hyperlipidemia 07/14/2016   Dermatochalasis of both upper eyelids 08/31/2014   Left foot pain 10/04/2012   Lumbosacral spondylosis without myelopathy 08/07/2011   Arthropathy of cervical spine 07/19/2011   Degenerative joint disease of cervical spine 07/19/2011   Cellulitis and abscess 06/20/2010   Hypothyroidism 06/20/2010   Psoriasis with arthropathy ([facility] 06/20/2010   OSA treated with BiPAP 06/20/2010   Sialoadenitis 03/04/2007    ONSET DATE: ***  REFERRING DIAG: R42 (ICD-10-CM) - Dizziness R26.89 (ICD-10-CM) - Imbalance   THERAPY DIAG:  No diagnosis found.  Rationale for Evaluation and Treatment {HABREHAB:27488}  SUBJECTIVE:   SUBJECTIVE STATEMENT: *** Pt accompanied by: {accompnied:27141}  PERTINENT HISTORY: B TKA, HLD, psoriatic arthritis, thyroid disease, L foot surgery,  spine surgery   PAIN:  Are you having pain? {OPRCPAIN:27236}  PRECAUTIONS: {Therapy precautions:24002}  WEIGHT BEARING RESTRICTIONS No  FALLS: Has patient fallen in last 6 months? {fallsyesno:27318}  LIVING ENVIRONMENT: Lives with: {OPRC lives with:25569::"lives with their family"} Lives in: {Lives in:25570} Stairs: {opstairs:27293} Has following equipment at home: {Assistive devices:23999}  PLOF: {PLOF:24004}  PATIENT GOALS ***  OBJECTIVE:   DIAGNOSTIC FINDINGS: none recent  COGNITION: Overall cognitive status: {cognition:24006}   SENSATION: {sensation:27233}  POSTURE: {posture:25561}   GAIT: Gait pattern: {gait characteristics:25376} Distance walked: *** Assistive device utilized: {Assistive devices:23999} Level of assistance: {Levels of assistance:24026} Comments: ***  FUNCTIONAL TESTs:  {Functional tests:24029}  PATIENT SURVEYS:  FOTO ***   VESTIBULAR ASSESSMENT   GENERAL OBSERVATION: ***     OCULOMOTOR EXAM:   Ocular Alignment: {Ocular Alignment:25262}   Ocular ROM: {RANGE OF MOTION:21649}   Spontaneous Nystagmus: {Spontaneous nystagmus:25263}   Gaze-Induced Nystagmus: {gaze-induced nystagmus:25264}   Smooth Pursuits: {smooth pursuit:25265}   Saccades: {saccades:25266}   Convergence/Divergence: *** cm    VESTIBULAR - OCULAR REFLEX:    Slow VOR: {slow VOR:25290}   VOR Cancellation: {vor cancellation:25291}   Head-Impulse Test: {head impulse test:25272}   Dynamic Visual Acuity: {dynamic visual acuity:25273}    POSITIONAL TESTING: {Positional tests:25271}    OTHOSTATICS: {Exam; orthostatics:31331}  FUNCTIONAL GAIT: {Functional tests:24029}   VESTIBULAR TREATMENT:  Canalith Repositioning:   {Canalith Repositioning:25283} Gaze Adaptation:   {gaze adaptation:25286} Habituation:   {habituation:25288} Other: ***  PATIENT EDUCATION: Education details: *** Person educated: {Person educated:25204} Education method: {Education  Method:25205} Education comprehension: {Education Comprehension:25206}   GOALS: Goals reviewed with patient? Yes  SHORT TERM GOALS: Target date: {follow up:25551}  Patient to be independent with initial HEP. Baseline: HEP initiated Goal status: INITIAL    LONG TERM GOALS: Target date: {follow up:25551}  Patient to be independent with advanced HEP. Baseline: Not yet initiated  Goal status: INITIAL  Patient to report 0/10 dizziness with standing vertical and horizontal VOR for 30 seconds. Baseline: Unable Goal status: INITIAL  Patient will report 0/10 dizziness with bed mobility.  Baseline: Symptomatic  Goal status: INITIAL  Patient to demonstrate *** sway with M-CTSIB condition with eyes closed/foam surface in order to improve safety in environments with uneven surfaces and dim lighting. Baseline: *** Goal status: INITIAL  Patient to score at least 20/24 on DGI in order to decrease risk of falls. Baseline: *** Goal status: INITIAL  Patient will ambulate over outdoor surfaces with LRAD while performing head turns to scan environment with good stability in order to indicate safe community mobility. Baseline: Unable Goal status: INITIAL    ASSESSMENT:  CLINICAL IMPRESSION:   Patient is a 81 y/o F presenting to OPPT with c/o dizziness for the past ***.   Denies head trauma, infection/illness, vision changes/double vision, hearing loss, tinnitus, otalgia, photo/phonophobia.  Oculomotor exam revealed ***.  Positional testing was ***.   Patient was educated on gentle *** HEP and reported understanding. Would benefit from skilled PT services ***x/week for *** weeks to address aforementioned impairments in order to optimize level of function.     OBJECTIVE IMPAIRMENTS {opptimpairments:25111}.   ACTIVITY LIMITATIONS {activitylimitations:27494}  PARTICIPATION LIMITATIONS: {participationrestrictions:25113}  PERSONAL FACTORS {Personal factors:25162} are also  affecting patient's functional outcome.   REHAB POTENTIAL: {rehabpotential:25112}  CLINICAL DECISION MAKING: {clinical decision making:25114}  EVALUATION COMPLEXITY: {Evaluation complexity:25115}  PLAN: PT FREQUENCY: {rehab frequency:25116}  PT DURATION: {rehab duration:25117}  PLANNED INTERVENTIONS: {rehab planned interventions:25118::"Therapeutic exercises","Therapeutic activity","Neuromuscular re-education","Balance training","Gait training","Patient/Family education","Joint mobilization"}  PLAN FOR NEXT SESSION: Manuela Neptune, PT 04/04/2022, 9:01 AM

## 2022-04-07 ENCOUNTER — Encounter: Payer: Self-pay | Admitting: Physical Therapy

## 2022-04-07 ENCOUNTER — Ambulatory Visit: Payer: Medicare Other | Attending: Nurse Practitioner | Admitting: Physical Therapy

## 2022-04-07 DIAGNOSIS — R42 Dizziness and giddiness: Secondary | ICD-10-CM | POA: Diagnosis not present

## 2022-04-07 DIAGNOSIS — R2689 Other abnormalities of gait and mobility: Secondary | ICD-10-CM | POA: Diagnosis not present

## 2022-04-07 DIAGNOSIS — R2681 Unsteadiness on feet: Secondary | ICD-10-CM | POA: Insufficient documentation

## 2022-04-14 ENCOUNTER — Ambulatory Visit: Payer: Medicare Other

## 2022-04-14 DIAGNOSIS — R2689 Other abnormalities of gait and mobility: Secondary | ICD-10-CM

## 2022-04-14 DIAGNOSIS — R2681 Unsteadiness on feet: Secondary | ICD-10-CM | POA: Diagnosis not present

## 2022-04-14 DIAGNOSIS — R42 Dizziness and giddiness: Secondary | ICD-10-CM

## 2022-04-14 NOTE — Therapy (Signed)
OUTPATIENT PHYSICAL THERAPY TREATMENT NOTE   Patient Name: Sydney Reid MRN: [id number] DOB:1941/01/20, 81 y.o., female Today's Date: 04/14/2022  PCP: PCP: [name], NP   REFERRING PROVIDER: REFERRING PROVIDER: [name], NP    END OF SESSION:   PT End of Session - 04/14/22 1316     Visit Number 2    Number of Visits 9    Date for PT Re-Evaluation 05/05/22    Authorization Type BCBS Medicare    PT Start Time 1315    PT Stop Time 1400    PT Time Calculation (min) 45 min    Activity Tolerance Patient tolerated treatment well    Behavior During Therapy Anxious;WFL for tasks assessed/performed             Past Medical History:  Diagnosis Date   Complication of anesthesia    Difficult intubation    20-25 years ago   H/O total knee replacement 10/28/1999   Right   High cholesterol    History of tonsillectomy and adenoidectomy 10/27/1944   History of transcatheter aortic valve replacement (TAVR) 08/06/2021   s/p Edwards 24m S3 vis TF approach with Dr. CBurt Knackand Dr. BCyndia Bent  Psoriatic arthritis ([place] 10/27/1988   Sleep apnea with use of continuous positive airway pressure (CPAP) 10/27/2002   Thyroid disease    Past Surgical History:  Procedure Laterality Date   CARDIAC CATHETERIZATION     CATARACT EXTRACTION W/ INTRAOCULAR LENS IMPLANT Bilateral    FOOT SURGERY     fused arch in left foot   GROIN DISSECTION  08/06/2021   Procedure: GVirl SonEXPLORATION ;  Surgeon: CWaynetta Sandy MD;  [facility] [facility]  Service: Vascular;;   INTRAOPERATIVE TRANSTHORACIC ECHOCARDIOGRAM N/A 08/06/2021   Procedure: INTRAOPERATIVE TRANSTHORACIC ECHOCARDIOGRAM;  Surgeon: TEarly Osmond MD;  [facility] [facility]  Service: Cardiovascular;  Laterality: N/A;   LOWER EXTREMITY ANGIOGRAM  08/06/2021   Procedure: LEFT LOWER EXTREMITY ANGIOGRAM;  Surgeon: TEarly Osmond MD;  [facility];  Service: Cardiovascular;;   PATCH ANGIOPLASTY  08/06/2021   Procedure:  PATCH ANGIOPLASTY OF FEMORAL ARTERY USING LEFT GREATER SAPHENOUS VEIN;  Surgeon: CWaynetta Sandy MD;  [place] [facility]  Service: Vascular;;   RIGHT/LEFT HEART CATH AND CORONARY ANGIOGRAPHY N/A 04/16/2021   Procedure: RIGHT/LEFT HEART CATH AND CORONARY ANGIOGRAPHY;  Surgeon: GAdrian Prows MD;  [facility];  Service: Cardiovascular;  Laterality: N/A;   SPINE SURGERY     TONSILLECTOMY     TOTAL KNEE ARTHROPLASTY Left    TRANSCATHETER AORTIC VALVE REPLACEMENT, TRANSFEMORAL N/A 08/06/2021   Procedure: TRANSCATHETER AORTIC VALVE REPLACEMENT, TRANSFEMORAL;  Surgeon: TEarly Osmond MD;  [place] [facility]  Service: Cardiovascular;  Laterality: N/A;   Patient Active Problem List   Diagnosis Date Noted   Epiretinal membrane (ERM) of right eye 08/28/2021   Fuchs' corneal dystrophy 08/28/2021   Peripheral vascular disease ([facility] 08/28/2021   Cataract, bilateral 08/28/2021   Unspecified atherosclerosis of native arteries of extremities, bilateral legs ([place] 08/28/2021   S/P TAVR (transcatheter aortic valve replacement) 08/06/2021   Asymptomatic bilateral carotid artery stenosis 05/09/2021   Severe aortic stenosis 04/16/2021   Polycythemia vera ([facility] 02/14/2021   Osteopenia 01/02/2021   Pain in right knee 07/04/2020   Hypothyroidism due to acquired atrophy of thyroid 05/12/2018   Dizziness and giddiness 05/12/2018   Closed fracture of proximal end of left humerus 02/25/2018   Chronic pain syndrome 07/08/2017   Psoriatic arthritis, 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[place]  07/08/2017   Hyperlipidemia 07/14/2016   Dermatochalasis of both upper eyelids 08/31/2014   Left foot pain 10/04/2012   Lumbosacral spondylosis without myelopathy 08/07/2011   Arthropathy of cervical spine 07/19/2011   Degenerative joint disease of cervical spine 07/19/2011   Cellulitis and abscess 06/20/2010   Hypothyroidism 06/20/2010   Psoriasis with arthropathy ([place]) 06/20/2010   OSA treated with BiPAP 06/20/2010    Sialoadenitis 03/04/2007    REFERRING DIAG: REFERRING DIAG: R42 (ICD-10-CM) - Dizziness R26.89 (ICD-10-CM) - Imbalance    THERAPY DIAG:  Dizziness and giddiness  Unsteadiness on feet  Other abnormalities of gait and mobility  Rationale for Evaluation and Treatment Rehabilitation  PERTINENT HISTORY: PERTINENT HISTORY: B TKA, HLD, psoriatic arthritis, thyroid disease, cervical fusion and B foot and hand surgery where they "tried to fuse and then removed the joints" per patient's report    PRECAUTIONS: n/a  SUBJECTIVE: quick movements and/or bending over tend to aggravate. Notes long hx of feeling water in the left ear  PAIN:  Are you having pain? No   OBJECTIVE:     TODAY'S TREATMENT: 04/14/22 Activity Comments  Seated/standing VOR x 1 horizontal/vertical x 30 sec No provocation  Brandt-Daroff Symptoms with arising, notes that it feels same left/right  Standing balance in corner: feet together EO/EC, on foam EO/EC, semi-tandem firm/compliant X15 sec  Nose to knee x 5 reps           DIAGNOSTIC FINDINGS: none recent   COGNITION: Overall cognitive status: Within functional limits for tasks assessed             SENSATION: WFL   POSTURE: significant rounded shoulders and forward head     GAIT: Gait pattern: step through pattern, decreased step length- Right, decreased step length- Left, and trunk flexed   Assistive device utilized: None Level of assistance: Complete Independence   PATIENT SURVEYS:  FOTO [phone]     VESTIBULAR ASSESSMENT              GENERAL OBSERVATION: R eye slightly lower                                   OCULOMOTOR EXAM:                       Ocular Alignment: normal                       Ocular ROM: No Limitations                       Spontaneous Nystagmus: absent                       Gaze-Induced Nystagmus: absent                       Smooth Pursuits:  1 saccade in inferior direction; normal for age                       Saccades:   slight undershooting to R                        Convergence/Divergence: 2 cm               VESTIBULAR - OCULAR REFLEX:  Slow VOR: Comment: c/o wooziness horizontal and vertical; limited by cervical ROM                       VOR Cancellation: Normal                       Head-Impulse Test: HIT Right: unable d/t cervical ROM HIT Left: unable d/t cervical ROM                                                          POSITIONAL TESTING:  Right Roll Test: negative; Duration: - Left Roll Test: negative; Duration: -   Right Sidelying: negative; c/o mild wooziness upon sitting up  Left Sidelying: negative; Duration: -        M-CTSIB  Condition 1: Firm Surface, EO 30 Sec, Normal Sway  Condition 2: Firm Surface, EC 30 Sec, Mild Sway  Condition 3: Foam Surface, EO 30 Sec, Mild and Moderate Sway  Condition 4: Foam Surface, EC Not tested d/t anxiety Sec,  -  Sway                        VESTIBULAR TREATMENT:     PATIENT EDUCATION: Education details: prognosis, POC, HEP- to be performed in a corner for safety; edu on vestibular hypofunction Person educated: Patient Education method: Explanation, Demonstration, Tactile cues, Verbal cues, and Handouts Education comprehension: verbalized understanding and returned demonstration     GOALS: Goals reviewed with patient? Yes   SHORT TERM GOALS: Target date: 04/21/2022   Patient to be independent with initial HEP. Baseline: HEP initiated Goal status: INITIAL       LONG TERM GOALS: Target date: 05/05/2022   Patient to be independent with advanced HEP. Baseline: Not yet initiated  Goal status: INITIAL   Patient to report 0/10 dizziness with standing vertical and horizontal VOR for 30 seconds. Baseline: Unable Goal status: INITIAL   Patient will report 0/10 dizziness with bed mobility.  Baseline: Symptomatic  Goal status: INITIAL   Patient to demonstrate moderate sway with M-CTSIB condition with eyes  closed/foam surface in order to improve safety in environments with uneven surfaces and dim lighting. Baseline: unable Goal status: INITIAL   Patient to score at least 20/24 on DGI in order to decrease risk of falls. Baseline: NT Goal status: INITIAL   Patient to report 70% improvement in dizziness.  Baseline: - Goal status: INITIAL       ASSESSMENT:   CLINICAL IMPRESSION:     Reports feeling some improvement since starting HEP.  Notes main symptoms provoked when standing and bending over and returning to upright.  Addition of various balance activities on compliant surface as treatment session intervention with addition of nose to knee for habituation for HEP. Continued sessions to progress POC details and reduce symptoms       OBJECTIVE IMPAIRMENTS Abnormal gait, decreased activity tolerance, decreased balance, dizziness, and postural dysfunction.    ACTIVITY LIMITATIONS bending, standing, squatting, sleeping, stairs, transfers, bed mobility, and reach over head   PARTICIPATION LIMITATIONS: meal prep, cleaning, laundry, driving, shopping, community activity, and church   PERSONAL FACTORS Age, Past/current experiences, Time since onset of injury/illness/exacerbation, and 3+ comorbidities: B TKA, HLD, psoriatic arthritis, thyroid disease, L foot surgery, cervical fusion  and B foot and hand surgery where they "tried to fuse and then removed the joints" per patient's report   are also affecting patient's functional outcome.    REHAB POTENTIAL: Good   CLINICAL DECISION MAKING: Evolving/moderate complexity   EVALUATION COMPLEXITY: Moderate     PLAN: PT FREQUENCY: 2x/week   PT DURATION: 4 weeks   PLANNED INTERVENTIONS: Therapeutic exercises, Therapeutic activity, Neuromuscular re-education, Balance training, Gait training, Patient/Family education, Joint mobilization, Stair training, Vestibular training, Canalith repositioning, Dry Needling, Electrical stimulation, Cryotherapy,  Moist heat, Taping, Manual therapy, and Re-evaluation   PLAN FOR NEXT SESSION: assess DGI, reassess HEP; progress VOR training, habituation, balance EC/foam   2:00 PM, 04/14/22 M. Sherlyn Lees, PT, DPT Physical Therapist- [place] Office Number: [phone]

## 2022-04-15 NOTE — Therapy (Signed)
OUTPATIENT PHYSICAL THERAPY TREATMENT NOTE   Patient Name: Sydney Reid MRN: [id number] DOB:06-Jul-1941, 81 y.o., female Today's Date: 04/16/2022  PCP: PCP: [name], NP   REFERRING PROVIDER: REFERRING PROVIDER: [name], NP    END OF SESSION:   PT End of Session - 04/16/22 1615     Visit Number 3    Number of Visits 9    Date for PT Re-Evaluation 05/05/22    Authorization Type BCBS Medicare    PT Start Time 1533    PT Stop Time 1612    PT Time Calculation (min) 39 min    Equipment Utilized During Treatment Gait belt    Activity Tolerance Patient tolerated treatment well    Behavior During Therapy WFL for tasks assessed/performed              Past Medical History:  Diagnosis Date   Complication of anesthesia    Difficult intubation    20-25 years ago   H/O total knee replacement 10/28/1999   Right   High cholesterol    History of tonsillectomy and adenoidectomy 10/27/1944   History of transcatheter aortic valve replacement (TAVR) 08/06/2021   s/p Edwards 61m S3 vis TF approach with Dr. CBurt Knackand Dr. BCyndia Bent  Psoriatic arthritis ([place] 10/27/1988   Sleep apnea with use of continuous positive airway pressure (CPAP) 10/27/2002   Thyroid disease    Past Surgical History:  Procedure Laterality Date   CARDIAC CATHETERIZATION     CATARACT EXTRACTION W/ INTRAOCULAR LENS IMPLANT Bilateral    FOOT SURGERY     fused arch in left foot   GROIN DISSECTION  08/06/2021   Procedure: GVirl SonEXPLORATION ;  Surgeon: CWaynetta Sandy MD;  [facility] [facility]  Service: Vascular;;   INTRAOPERATIVE TRANSTHORACIC ECHOCARDIOGRAM N/A 08/06/2021   Procedure: INTRAOPERATIVE TRANSTHORACIC ECHOCARDIOGRAM;  Surgeon: TEarly Osmond MD;  [facility]  Service: Cardiovascular;  Laterality: N/A;   LOWER EXTREMITY ANGIOGRAM  08/06/2021   Procedure: LEFT LOWER EXTREMITY ANGIOGRAM;  Surgeon: TEarly Osmond MD;  [place] [facility];  Service: Cardiovascular;;    PATCH ANGIOPLASTY  08/06/2021   Procedure: PATCH ANGIOPLASTY OF FEMORAL ARTERY USING LEFT GREATER SAPHENOUS VEIN;  Surgeon: CWaynetta Sandy MD;  [place] [facility]  Service: Vascular;;   RIGHT/LEFT HEART CATH AND CORONARY ANGIOGRAPHY N/A 04/16/2021   Procedure: RIGHT/LEFT HEART CATH AND CORONARY ANGIOGRAPHY;  Surgeon: GAdrian Prows MD;  [facility];  Service: Cardiovascular;  Laterality: N/A;   SPINE SURGERY     TONSILLECTOMY     TOTAL KNEE ARTHROPLASTY Left    TRANSCATHETER AORTIC VALVE REPLACEMENT, TRANSFEMORAL N/A 08/06/2021   Procedure: TRANSCATHETER AORTIC VALVE REPLACEMENT, TRANSFEMORAL;  Surgeon: TEarly Osmond MD;  [place] [facility]  Service: Cardiovascular;  Laterality: N/A;   Patient Active Problem List   Diagnosis Date Noted   Epiretinal membrane (ERM) of right eye 08/28/2021   Fuchs' corneal dystrophy 08/28/2021   Peripheral vascular disease ([place] 08/28/2021   Cataract, bilateral 08/28/2021   Unspecified atherosclerosis of native arteries of extremities, bilateral legs ([facility] 08/28/2021   S/P TAVR (transcatheter aortic valve replacement) 08/06/2021   Asymptomatic bilateral carotid artery stenosis 05/09/2021   Severe aortic stenosis 04/16/2021   Polycythemia vera ([place] 02/14/2021   Osteopenia 01/02/2021   Pain in right knee 07/04/2020   Hypothyroidism due to acquired atrophy of thyroid 05/12/2018   Dizziness and giddiness 05/12/2018   Closed fracture of proximal end of left humerus 02/25/2018   Chronic  pain syndrome 07/08/2017   Psoriatic arthritis, 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[place]) 07/08/2017   Hyperlipidemia 07/14/2016   Dermatochalasis of both upper eyelids 08/31/2014   Left foot pain 10/04/2012   Lumbosacral spondylosis without myelopathy 08/07/2011   Arthropathy of cervical spine 07/19/2011   Degenerative joint disease of cervical spine 07/19/2011   Cellulitis and abscess 06/20/2010   Hypothyroidism 06/20/2010   Psoriasis with arthropathy ([place])  06/20/2010   OSA treated with BiPAP 06/20/2010   Sialoadenitis 03/04/2007    REFERRING DIAG: REFERRING DIAG: R42 (ICD-10-CM) - Dizziness R26.89 (ICD-10-CM) - Imbalance    THERAPY DIAG:  Dizziness and giddiness  Unsteadiness on feet  Other abnormalities of gait and mobility  Rationale for Evaluation and Treatment Rehabilitation  PERTINENT HISTORY: PERTINENT HISTORY: B TKA, HLD, psoriatic arthritis, thyroid disease, cervical fusion and B foot and hand surgery where they "tried to fuse and then removed the joints" per patient's report    PRECAUTIONS: n/a  SUBJECTIVE: Exercises really seem to be helping. Has been busy and rushing around a lot today, so sx may be a little worse as a result today.   PAIN:  Are you having pain? No   OBJECTIVE:      TODAY'S TREATMENT: 04/16/22 Activity Comments  R DH R Upbeating torsional nystagmus lasting ~ 1 min  R Epley Tolerated well, limited cervical ROM limited ideal positioning   R DH trial #2 Lower amplitude R Upbeating torsional nystagmus lasting ~20 sec; no c/o dizziness  R Brandt daroff 3x PT assist to move a little more quickly; mild dizziness upon sitting up      [facility] PT Assessment - 04/16/22 0001       Standardized Balance Assessment   Standardized Balance Assessment Dynamic Gait Index      Dynamic Gait Index   Level Surface Normal    Change in Gait Speed Normal    Gait with Horizontal Head Turns Mild Impairment    Gait with Vertical Head Turns Normal    Gait and Pivot Turn Normal    Step Over Obstacle Mild Impairment    Step Around Obstacles Mild Impairment    Steps Mild Impairment    Total Score 20    DGI comment: mild listing to the L throughout             PATIENT EDUCATION: Education details: edu on BPPV and inner ear anatomy; edu on post-epley instructions and safety Person educated: Patient Education method: Explanation, Demonstration, Tactile cues, and Verbal cues Education comprehension: verbalized  understanding and returned demonstration   HOME EXERCISE PROGRAM Last updated: 04/07/22 Access Code: [email or web address] URL: [email or web address] Date: 04/15/2022 Prepared by: [facility]  Exercises - Seated Gaze Stabilization with Head Rotation  - 1 x daily - 5 x weekly - 2-3 sets - 30 sec hold - Seated Gaze Stabilization with Head Nod  - 1 x daily - 5 x weekly - 2-3 sets - 30 sec hold - Brandt-Daroff Vestibular Exercise  - 1 x daily - 5 x weekly - 2 sets - 3-5 reps - Romberg Stance with Eyes Closed  - 1 x daily - 5 x weekly - 2-3 sets - 30 sec hold     Below measures were taken at time of initial evaluation unless otherwise specified:  DIAGNOSTIC FINDINGS: none recent   COGNITION: Overall cognitive status: Within functional limits for tasks assessed             SENSATION: WFL   POSTURE: significant rounded  shoulders and forward head     GAIT: Gait pattern: step through pattern, decreased step length- Right, decreased step length- Left, and trunk flexed   Assistive device utilized: None Level of assistance: Complete Independence   PATIENT SURVEYS:  FOTO [phone]     VESTIBULAR ASSESSMENT              GENERAL OBSERVATION: R eye slightly lower                                   OCULOMOTOR EXAM:                       Ocular Alignment: normal                       Ocular ROM: No Limitations                       Spontaneous Nystagmus: absent                       Gaze-Induced Nystagmus: absent                       Smooth Pursuits:  1 saccade in inferior direction; normal for age                       Saccades:  slight undershooting to R                        Convergence/Divergence: 2 cm               VESTIBULAR - OCULAR REFLEX:                        Slow VOR: Comment: c/o wooziness horizontal and vertical; limited by cervical ROM                       VOR Cancellation: Normal                       Head-Impulse Test: HIT  Right: unable d/t cervical ROM HIT Left: unable d/t cervical ROM                                                          POSITIONAL TESTING:  Right Roll Test: negative; Duration: - Left Roll Test: negative; Duration: -   Right Sidelying: negative; c/o mild wooziness upon sitting up  Left Sidelying: negative; Duration: -        M-CTSIB  Condition 1: Firm Surface, EO 30 Sec, Normal Sway  Condition 2: Firm Surface, EC 30 Sec, Mild Sway  Condition 3: Foam Surface, EO 30 Sec, Mild and Moderate Sway  Condition 4: Foam Surface, EC Not tested d/t anxiety Sec,  -  Sway                        VESTIBULAR TREATMENT:     PATIENT EDUCATION: Education details: prognosis, POC, HEP- to be performed in a corner for safety; edu on vestibular hypofunction Person  educated: Patient Education method: Explanation, Demonstration, Tactile cues, Verbal cues, and Handouts Education comprehension: verbalized understanding and returned demonstration     GOALS: Goals reviewed with patient? Yes   SHORT TERM GOALS: Target date: 04/21/2022   Patient to be independent with initial HEP. Baseline: HEP initiated Goal status: IN PROGRESS       LONG TERM GOALS: Target date: 05/05/2022   Patient to be independent with advanced HEP. Baseline: Not yet initiated  Goal status: IN PROGRESS   Patient to report 0/10 dizziness with standing vertical and horizontal VOR for 30 seconds. Baseline: Unable Goal status: IN PROGRESS   Patient will report 0/10 dizziness with bed mobility.  Baseline: Symptomatic  Goal status: IN PROGRESS   Patient to demonstrate moderate sway with M-CTSIB condition with eyes closed/foam surface in order to improve safety in environments with uneven surfaces and dim lighting. Baseline: unable Goal status: IN PROGRESS   Patient to score at least 20/24 on DGI in order to decrease risk of falls. Baseline: NT Goal status: IN PROGRESS   Patient to report 70% improvement in  dizziness.  Baseline: - Goal status: IN PROGRESS       ASSESSMENT:   CLINICAL IMPRESSION:     Patient arrived to session with report of improving dizziness. Patient scored 20/24 on DGI, indicated decreased risk of falls. Most difficulty evident with L head turns and evident mild listing to the L throughout gait. Positional testing revealed R posterior canalithiasis. Patient tolerated R Epley well and upon 2nd trial of R DH, was symptomatic despite small amplitude nystagmus remaining. Patient was educated on continuing habituation exercises to address remaining BPPV. Patient reported mild unsteadiness at end of session but able to exit clinic safely.      OBJECTIVE IMPAIRMENTS Abnormal gait, decreased activity tolerance, decreased balance, dizziness, and postural dysfunction.    ACTIVITY LIMITATIONS bending, standing, squatting, sleeping, stairs, transfers, bed mobility, and reach over head   PARTICIPATION LIMITATIONS: meal prep, cleaning, laundry, driving, shopping, community activity, and church   PERSONAL FACTORS Age, Past/current experiences, Time since onset of injury/illness/exacerbation, and 3+ comorbidities: B TKA, HLD, psoriatic arthritis, thyroid disease, L foot surgery, cervical fusion and B foot and hand surgery where they "tried to fuse and then removed the joints" per patient's report   are also affecting patient's functional outcome.    REHAB POTENTIAL: Good   CLINICAL DECISION MAKING: Evolving/moderate complexity   EVALUATION COMPLEXITY: Moderate     PLAN: PT FREQUENCY: 2x/week   PT DURATION: 4 weeks   PLANNED INTERVENTIONS: Therapeutic exercises, Therapeutic activity, Neuromuscular re-education, Balance training, Gait training, Patient/Family education, Joint mobilization, Stair training, Vestibular training, Canalith repositioning, Dry Needling, Electrical stimulation, Cryotherapy, Moist heat, Taping, Manual therapy, and Re-evaluation   PLAN FOR NEXT SESSION:  reassess R DH and HEP; progress VOR training, habituation, balance EC/foam  Janene Harvey, PT, DPT 04/16/22 4:16 PM  [facility] [address], [place], [place] [address] Phone # [phone] Fax # [phone]

## 2022-04-16 ENCOUNTER — Ambulatory Visit: Payer: Medicare Other | Admitting: Physical Therapy

## 2022-04-16 ENCOUNTER — Other Ambulatory Visit: Payer: Self-pay | Admitting: *Deleted

## 2022-04-16 ENCOUNTER — Encounter: Payer: Self-pay | Admitting: Physical Therapy

## 2022-04-16 DIAGNOSIS — L4052 Psoriatic arthritis mutilans: Secondary | ICD-10-CM

## 2022-04-16 DIAGNOSIS — R2689 Other abnormalities of gait and mobility: Secondary | ICD-10-CM

## 2022-04-16 DIAGNOSIS — R42 Dizziness and giddiness: Secondary | ICD-10-CM

## 2022-04-16 DIAGNOSIS — R2681 Unsteadiness on feet: Secondary | ICD-10-CM

## 2022-04-16 DIAGNOSIS — G894 Chronic pain syndrome: Secondary | ICD-10-CM

## 2022-04-16 NOTE — Telephone Encounter (Signed)
Patient requested refill. Understands that she is requesting early but needs it by Saturday.   Epic LR: 02/18/2022 Contract Date: 02/18/2021 (note added to upcoming appointment to update)  Pended Rx and sent to [facility] for approval.

## 2022-04-17 DIAGNOSIS — L84 Corns and callosities: Secondary | ICD-10-CM | POA: Diagnosis not present

## 2022-04-17 DIAGNOSIS — L603 Nail dystrophy: Secondary | ICD-10-CM | POA: Diagnosis not present

## 2022-04-17 DIAGNOSIS — I739 Peripheral vascular disease, unspecified: Secondary | ICD-10-CM | POA: Diagnosis not present

## 2022-04-17 DIAGNOSIS — B351 Tinea unguium: Secondary | ICD-10-CM | POA: Diagnosis not present

## 2022-04-17 DIAGNOSIS — L4052 Psoriatic arthritis mutilans: Secondary | ICD-10-CM | POA: Diagnosis not present

## 2022-04-17 DIAGNOSIS — M792 Neuralgia and neuritis, unspecified: Secondary | ICD-10-CM | POA: Diagnosis not present

## 2022-04-17 MED ORDER — HYDROCODONE-ACETAMINOPHEN 5-325 MG PO TABS: 1.0000 | ORAL_TABLET | Freq: Two times a day (BID) | ORAL | 0 refills | Status: DC

## 2022-04-21 ENCOUNTER — Other Ambulatory Visit: Payer: Self-pay | Admitting: Nurse Practitioner

## 2022-04-21 ENCOUNTER — Telehealth: Payer: Self-pay

## 2022-04-21 ENCOUNTER — Other Ambulatory Visit: Payer: Self-pay | Admitting: Cardiology

## 2022-04-21 DIAGNOSIS — G894 Chronic pain syndrome: Secondary | ICD-10-CM

## 2022-04-21 DIAGNOSIS — E034 Atrophy of thyroid (acquired): Secondary | ICD-10-CM

## 2022-04-21 DIAGNOSIS — L4052 Psoriatic arthritis mutilans: Secondary | ICD-10-CM

## 2022-04-21 DIAGNOSIS — E782 Mixed hyperlipidemia: Secondary | ICD-10-CM

## 2022-04-21 MED ORDER — HYDROCODONE-ACETAMINOPHEN 5-325 MG PO TABS: 1.0000 | ORAL_TABLET | Freq: Two times a day (BID) | ORAL | 0 refills | Status: DC

## 2022-04-22 ENCOUNTER — Other Ambulatory Visit: Payer: Self-pay | Admitting: Family

## 2022-04-22 ENCOUNTER — Encounter: Payer: Medicare Other | Admitting: Physical Therapy

## 2022-04-22 DIAGNOSIS — L4052 Psoriatic arthritis mutilans: Secondary | ICD-10-CM

## 2022-04-22 DIAGNOSIS — G894 Chronic pain syndrome: Secondary | ICD-10-CM

## 2022-04-22 MED ORDER — HYDROCODONE-ACETAMINOPHEN 5-325 MG PO TABS: 1.0000 | ORAL_TABLET | Freq: Two times a day (BID) | ORAL | 0 refills | Status: DC

## 2022-04-22 NOTE — Progress Notes (Signed)
Hydrocodone prescription send to [facility] on [address] per patient's request regular pharmacy on backup order.

## 2022-04-22 NOTE — Telephone Encounter (Signed)
[facility] [address]

## 2022-04-22 NOTE — Telephone Encounter (Signed)
[facility] on

## 2022-04-28 ENCOUNTER — Other Ambulatory Visit: Payer: Self-pay

## 2022-04-28 DIAGNOSIS — G894 Chronic pain syndrome: Secondary | ICD-10-CM

## 2022-04-28 DIAGNOSIS — L4052 Psoriatic arthritis mutilans: Secondary | ICD-10-CM

## 2022-04-28 MED ORDER — HYDROCODONE-ACETAMINOPHEN 5-325 MG PO TABS: 1.0000 | ORAL_TABLET | Freq: Two times a day (BID) | ORAL | 0 refills | Status: DC

## 2022-04-30 ENCOUNTER — Ambulatory Visit: Payer: Medicare Other

## 2022-04-30 DIAGNOSIS — I6523 Occlusion and stenosis of bilateral carotid arteries: Secondary | ICD-10-CM

## 2022-05-01 ENCOUNTER — Ambulatory Visit: Payer: Medicare Other | Attending: Nurse Practitioner | Admitting: Physical Therapy

## 2022-05-01 ENCOUNTER — Encounter: Payer: Self-pay | Admitting: Physical Therapy

## 2022-05-01 DIAGNOSIS — R42 Dizziness and giddiness: Secondary | ICD-10-CM | POA: Insufficient documentation

## 2022-05-01 DIAGNOSIS — R2681 Unsteadiness on feet: Secondary | ICD-10-CM | POA: Diagnosis not present

## 2022-05-01 DIAGNOSIS — R2689 Other abnormalities of gait and mobility: Secondary | ICD-10-CM | POA: Insufficient documentation

## 2022-05-01 NOTE — Therapy (Addendum)
OUTPATIENT PHYSICAL THERAPY TREATMENT NOTE   Patient Name: Sydney Reid MRN: [id number] DOB:09/30/1941, 81 y.o., female Today's Date: 05/01/2022  PCP: PCP: [name], NP   REFERRING PROVIDER: REFERRING PROVIDER: [name], NP    Progress Note Reporting Period 6/12/3 to 05/01/22  See note below for Objective Data and Assessment of Progress/Goals.      END OF SESSION:   PT End of Session - 05/01/22 1228     Visit Number 4    Number of Visits 9    Date for PT Re-Evaluation 05/29/22    Authorization Type BCBS Medicare    Progress Note Due on Visit 14    PT Start Time 1146    PT Stop Time 1229    PT Time Calculation (min) 43 min    Activity Tolerance Patient tolerated treatment well    Behavior During Therapy [facility] for tasks assessed/performed               Past Medical History:  Diagnosis Date   Complication of anesthesia    Difficult intubation    20-25 years ago   H/O total knee replacement 10/28/1999   Right   High cholesterol    History of tonsillectomy and adenoidectomy 10/27/1944   History of transcatheter aortic valve replacement (TAVR) 08/06/2021   s/p Edwards 43m S3 vis TF approach with Dr. CBurt Knackand Dr. BCyndia Bent  [place] [address] 10/[place]   Sleep apnea with use of continuous positive airway pressure (CPAP) 10/27/2002   Thyroid disease    Past Surgical History:  Procedure Laterality Date   CARDIAC CATHETERIZATION     CATARACT EXTRACTION W/ INTRAOCULAR LENS IMPLANT Bilateral    FOOT SURGERY     fused arch in left foot   GROIN DISSECTION  08/06/2021   Procedure: GVirl SonEXPLORATION ;  Surgeon: CWaynetta Sandy MD;  [place]  Service: Vascular;;   INTRAOPERATIVE TRANSTHORACIC ECHOCARDIOGRAM N/A 08/06/2021   Procedure: INTRAOPERATIVE TRANSTHORACIC ECHOCARDIOGRAM;  Surgeon: TEarly Osmond MD;  [place]  Service: Cardiovascular;  Laterality: N/A;   LOWER EXTREMITY ANGIOGRAM  08/06/2021    Procedure: LEFT LOWER EXTREMITY ANGIOGRAM;  Surgeon: TEarly Osmond MD;  [place]  Service: Cardiovascular;;   PATCH ANGIOPLASTY  08/06/2021   Procedure: PATCH ANGIOPLASTY OF FEMORAL ARTERY USING LEFT GREATER SAPHENOUS VEIN;  Surgeon: CWaynetta Sandy MD;  [place]  Service: Vascular;;   RIGHT/LEFT HEART CATH AND CORONARY ANGIOGRAPHY N/A 04/16/2021   Procedure: RIGHT/LEFT HEART CATH AND CORONARY ANGIOGRAPHY;  Surgeon: GAdrian Prows MD;  [facility];  Service: Cardiovascular;  Laterality: N/A;   SPINE SURGERY     TONSILLECTOMY     TOTAL KNEE ARTHROPLASTY Left    TRANSCATHETER AORTIC VALVE REPLACEMENT, TRANSFEMORAL N/A 08/06/2021   Procedure: TRANSCATHETER AORTIC VALVE REPLACEMENT, TRANSFEMORAL;  Surgeon: TEarly Osmond MD;  [place]  Service: Cardiovascular;  Laterality: N/A;   Patient Active Problem List   Diagnosis Date Noted   Epiretinal membrane (ERM) of right eye 08/28/2021   Fuchs' corneal dystrophy 08/28/2021   Peripheral vascular disease ([place] 08/28/2021   Cataract, bilateral 08/28/2021   Unspecified atherosclerosis of native arteries of extremities, bilateral legs ([place] 08/28/2021   S/P TAVR (transcatheter aortic valve replacement) 08/06/2021   Asymptomatic bilateral carotid artery stenosis 05/09/2021   Severe aortic stenosis 04/16/2021   Polycythemia vera ([place] 02/14/2021   Osteopenia 01/02/2021   Pain in right knee 07/04/2020   Hypothyroidism due to acquired  atrophy of thyroid 05/12/2018   Dizziness and giddiness 05/12/2018   Closed fracture of proximal end of left humerus 02/25/2018   Chronic pain syndrome 07/08/2017   Psoriatic arthritis, 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[place]) 07/08/2017   Hyperlipidemia 07/14/2016   Dermatochalasis of both upper eyelids 08/31/2014   Left foot pain 10/04/2012   Lumbosacral spondylosis without myelopathy 08/07/2011   Arthropathy of cervical spine 07/19/2011   Degenerative joint disease of cervical  spine 07/19/2011   Cellulitis and abscess 06/20/2010   Hypothyroidism 06/20/2010   Psoriasis with arthropathy ([place]) 06/20/2010   OSA treated with BiPAP 06/20/2010   Sialoadenitis 03/04/2007    REFERRING DIAG: REFERRING DIAG: R42 (ICD-10-CM) - Dizziness R26.89 (ICD-10-CM) - Imbalance    THERAPY DIAG:  Dizziness and giddiness - Plan: PT plan of care cert/re-cert  Unsteadiness on feet - Plan: PT plan of care cert/re-cert  Other abnormalities of gait and mobility - Plan: PT plan of care cert/re-cert  Rationale for Evaluation and Treatment Rehabilitation  PERTINENT HISTORY: PERTINENT HISTORY: B TKA, HLD, psoriatic arthritis, thyroid disease, cervical fusion and B foot and hand surgery where they "tried to fuse and then removed the joints" per patient's report    PRECAUTIONS: n/a  SUBJECTIVE: I'm really really dizzy, I didn't do my exercises much this week as I was out of town. My dizziness is OK sitting but it gets worse when I move in general. Its been going on for 6 years but its gotten progressively worse. Sometimes its worse when I stand up real fast, worse when I bend over too. I'd say my dizziness has been about a 7/10 recently.   PAIN:  Are you having pain? No   OBJECTIVE:    TODAY'S TREATMENT 05/01/22  Epley negative B today but testing/assessment limited by limited cervical ROM (secondary to hx cervical fusion). No nystagmus or increased symptoms noted.   Orthostatics: Supine: 150/89 HR 88 Sitting: 116/91 HR 92 Standing: 147/95 HR 97 Standing x3 minutes: 139/95 HR 96  VOR mild impairment horizontal, seemed OK vertical but got more dizzy; VOR cancellation negative but did get dizzy when she stopped. Head impulse test negative (in available cervical ROM today).   Horizontal VOR x60 seconds 2 rounds Vertical VOR x60 seconds 2 rounds        PATIENT EDUCATION: Education details: orthostatic findings, VOR findings, encouraged increased compliance with HEP, benefits of  continuing with PT, mechanism of VOR neurologically Person educated: Patient Education method: Explanation, Demonstration, Tactile cues, and Verbal cues Education comprehension: verbalized understanding and returned demonstration   HOME EXERCISE PROGRAM Last updated: 04/07/22 Access Code: [email or web address] URL: [email or web address] Date: 04/15/2022 Prepared by: [facility]  Exercises - Seated Gaze Stabilization with Head Rotation  - 1 x daily - 5 x weekly - 2-3 sets - 30 sec hold - Seated Gaze Stabilization with Head Nod  - 1 x daily - 5 x weekly - 2-3 sets - 30 sec hold - Brandt-Daroff Vestibular Exercise  - 1 x daily - 5 x weekly - 2 sets - 3-5 reps - Romberg Stance with Eyes Closed  - 1 x daily - 5 x weekly - 2-3 sets - 30 sec hold     Below measures were taken at time of initial evaluation unless otherwise specified:  DIAGNOSTIC FINDINGS: none recent   COGNITION: Overall cognitive status: Within functional limits for tasks assessed             SENSATION: WFL   POSTURE: significant rounded  shoulders and forward head     GAIT: Gait pattern: step through pattern, decreased step length- Right, decreased step length- Left, and trunk flexed   Assistive device utilized: None Level of assistance: Complete Independence   PATIENT SURVEYS:  FOTO [phone]     VESTIBULAR ASSESSMENT              GENERAL OBSERVATION: R eye slightly lower                                   OCULOMOTOR EXAM:                       Ocular Alignment: normal                       Ocular ROM: No Limitations                       Spontaneous Nystagmus: absent                       Gaze-Induced Nystagmus: absent                       Smooth Pursuits:  1 saccade in inferior direction; normal for age                       Saccades:  slight undershooting to R                        Convergence/Divergence: 2 cm               VESTIBULAR - OCULAR REFLEX:                         Slow VOR: Comment: c/o wooziness horizontal and vertical; limited by cervical ROM                       VOR Cancellation: Normal                       Head-Impulse Test: HIT Right: unable d/t cervical ROM HIT Left: unable d/t cervical ROM                                                          POSITIONAL TESTING:  Right Roll Test: negative; Duration: - Left Roll Test: negative; Duration: -   Right Sidelying: negative; c/o mild wooziness upon sitting up  Left Sidelying: negative; Duration: -        M-CTSIB  Condition 1: Firm Surface, EO 30 Sec, Normal Sway  Condition 2: Firm Surface, EC 30 Sec, Mild Sway  Condition 3: Foam Surface, EO 30 Sec, Mild and Moderate Sway  Condition 4: Foam Surface, EC Not tested d/t anxiety Sec,  -  Sway                        VESTIBULAR TREATMENT:     PATIENT EDUCATION: Education details: prognosis, POC, HEP- to be performed in a corner for safety; edu on vestibular hypofunction Person  educated: Patient Education method: Explanation, Demonstration, Tactile cues, Verbal cues, and Handouts Education comprehension: verbalized understanding and returned demonstration     GOALS: Goals reviewed with patient? Yes   SHORT TERM GOALS: Target date: 04/21/2022   Patient to be independent with initial HEP. Baseline: HEP initiated Goal status: IN PROGRESS       LONG TERM GOALS: Target date: 05/05/2022   Patient to be independent with advanced HEP. Baseline: Not yet initiated  Goal status: IN PROGRESS   Patient to report 0/10 dizziness with standing vertical and horizontal VOR for 30 seconds. Baseline: Unable Goal status: IN PROGRESS   Patient will report 0/10 dizziness with bed mobility.  Baseline: Symptomatic  Goal status: IN PROGRESS   Patient to demonstrate moderate sway with M-CTSIB condition with eyes closed/foam surface in order to improve safety in environments with uneven surfaces and dim lighting. Baseline:  unable Goal status: IN PROGRESS   Patient to score at least 20/24 on DGI in order to decrease risk of falls. Baseline: NT Goal status: IN PROGRESS   Patient to report 70% improvement in dizziness.  Baseline: - Goal status: IN PROGRESS       ASSESSMENT:   CLINICAL IMPRESSION:    Mattie arrives today very dizzy- we rechecked Epley but she was negative B with no nystagmus and no increased dizziness. Orthostatics were a bit impaired (see above), advised her to f/u with PCP and/or cardiologist on this. VOR testing was limited due to limited cervical ROM but she did become a bit more symptomatic with these today, we focused on basic VOR exercises for the rest of the session today. She was a bit hesitant to continue with PT ("I think this may be something I just need to live with, I'm 81, things happen") but able to convince her to try seeing as she's really only had a couple of treatment sessions at this point. Sent new cert to MD today.      OBJECTIVE IMPAIRMENTS Abnormal gait, decreased activity tolerance, decreased balance, dizziness, and postural dysfunction.    ACTIVITY LIMITATIONS bending, standing, squatting, sleeping, stairs, transfers, bed mobility, and reach over head   PARTICIPATION LIMITATIONS: meal prep, cleaning, laundry, driving, shopping, community activity, and church   PERSONAL FACTORS Age, Past/current experiences, Time since onset of injury/illness/exacerbation, and 3+ comorbidities: B TKA, HLD, psoriatic arthritis, thyroid disease, L foot surgery, cervical fusion and B foot and hand surgery where they "tried to fuse and then removed the joints" per patient's report   are also affecting patient's functional outcome.    REHAB POTENTIAL: Good   CLINICAL DECISION MAKING: Evolving/moderate complexity   EVALUATION COMPLEXITY: Moderate     PLAN: PT FREQUENCY: 2x/week   PT DURATION: 4 weeks   PLANNED INTERVENTIONS: Therapeutic exercises, Therapeutic activity,  Neuromuscular re-education, Balance training, Gait training, Patient/Family education, Joint mobilization, Stair training, Vestibular training, Canalith repositioning, Dry Needling, Electrical stimulation, Cryotherapy, Moist heat, Taping, Manual therapy, and Re-evaluation   PLAN FOR NEXT SESSION: reassess R DH and HEP; progress VOR training, habituation, balance EC/foam   Anaika Santillano U PT DPT PN2  05/01/2022, 12:37 PM  [facility] at [facility] [address] [place], [place] [address] Phone # [phone] Fax # [phone]

## 2022-05-08 ENCOUNTER — Encounter: Payer: Self-pay | Admitting: Cardiology

## 2022-05-08 ENCOUNTER — Ambulatory Visit: Payer: Medicare Other | Admitting: Cardiology

## 2022-05-08 VITALS — BP 122/73 | HR 76 | Temp 97.6°F | Resp 16 | Ht 63.0 in | Wt 146.8 lb

## 2022-05-08 DIAGNOSIS — I6523 Occlusion and stenosis of bilateral carotid arteries: Secondary | ICD-10-CM | POA: Diagnosis not present

## 2022-05-08 DIAGNOSIS — Z952 Presence of prosthetic heart valve: Secondary | ICD-10-CM | POA: Diagnosis not present

## 2022-05-08 DIAGNOSIS — E782 Mixed hyperlipidemia: Secondary | ICD-10-CM

## 2022-05-08 MED ORDER — SIMVASTATIN 10 MG PO TABS: 10.0000 mg | ORAL_TABLET | ORAL | 3 refills | Status: DC

## 2022-05-08 NOTE — Progress Notes (Signed)
Primary Physician/Referring:  [name], NP  Patient ID: Sydney Reid, female    DOB: 1941-06-24, 81 y.o.   MRN: [id number]  Chief Complaint  Patient presents with   TAVR   Carotid Stenosis   Follow-up    6 months   HPI:    Sydney Reid  is a 81 y.o. Caucasian female patient with aortic stenosis, stage IIIa chronic kidney disease, hyperlipidemia, obstructive sleep apnea on CPAP, echocardiogram on 02/19/2021 revealing severe aortic stenosis. Underwent TAVR on 08/17/2021 with placement of a 23 mm Edwards CPN valve.    She has chronic dizziness but still very independent.  She has psoriatic arthritis with marked degenerative joint disease involving her hands but still able to cook and maintain all physical activities without limitations.  Since finishing the cardiac rehab, patient has returned to full activity without any limitations and essentially has no specific symptoms today.     Past Medical History:  Diagnosis Date   Complication of anesthesia    Difficult intubation    20-25 years ago   H/O total knee replacement 10/28/1999   Right   High cholesterol    History of tonsillectomy and adenoidectomy 10/27/1944   History of transcatheter aortic valve replacement (TAVR) 08/06/2021   s/p Edwards 11m S3 vis TF approach with Dr. CBurt Knackand Dr. BCyndia Bent  Psoriatic arthritis ([facility] 10/27/1988   Sleep apnea with use of continuous positive airway pressure (CPAP) 10/27/2002   Thyroid disease    Social History   Tobacco Use   Smoking status: Former    Packs/day: 2.00    Years: 18.00    Total pack years: 36.00    Types: Cigarettes    Quit date: 1975    Years since quitting: 48.5   Smokeless tobacco: Never  Substance Use Topics   Alcohol use: Yes    Alcohol/week: 1.0 - 2.0 standard drink of alcohol    Types: 1 - 2 Standard drinks or equivalent per week    Comment: Social drinker, couple times a week   Marital Status: Married  ROS  Review of Systems   Cardiovascular:  Negative for chest pain, dyspnea on exertion and leg swelling.  Neurological:  Positive for dizziness (occasional).   Objective  Blood pressure 122/73, pulse 76, temperature 97.6 F (36.4 C), temperature source Temporal, resp. rate 16, height '5\' 3"'$  (1.6 m), weight 146 lb 12.8 oz (66.6 kg), SpO2 98 %. Body mass index is 26 kg/m.     05/08/2022    1:05 PM 03/17/2022    1:26 PM 03/03/2022   11:58 AM  Vitals with BMI  Height '5\' 3"'$   '5\' 3"'$   Weight 146 lbs 13 oz 147 lbs 146 lbs 3 oz  BMI 26.01 202.77241.2 Systolic [id number]  Diastolic 73 80   Pulse 76 96     Orthostatic VS for the past 72 hrs (Last 3 readings):  Patient Position BP Location Cuff Size  05/08/22 1305 Sitting Left Arm Normal     Physical Exam Constitutional:      Comments: Short stature  Neck:     Vascular: No JVD.  Cardiovascular:     Rate and Rhythm: Normal rate and regular rhythm.     Pulses: Normal pulses and intact distal pulses.          Carotid pulses are  on the right side with bruit.    Heart sounds: Murmur heard.     Early systolic murmur is present with  a grade of 1/6 at the upper right sternal border.     No gallop.  Pulmonary:     Effort: Pulmonary effort is normal.     Breath sounds: Normal breath sounds.  Musculoskeletal:        General: Deformity (bilateral atrophied fingers) present.     Right lower leg: No edema.     Left lower leg: No edema.      Laboratory examination:   Recent Labs    08/07/21 0143 12/30/21 1055 01/17/22 1602  NA 137 140 139  K 4.3 4.1 4.3  CL 109 106 106  CO2 20* 27 25  GLUCOSE 139* 90 83  BUN '10 15 12  '$ CREATININE 0.85 1.10* 0.99  CALCIUM 8.6* 9.9 9.7  GFRNONAA >60 50* 57*   CrCl cannot be calculated (Patient's most recent lab result is older than the maximum 21 days allowed.).     Latest Ref Rng & Units 01/17/2022    4:02 PM 12/30/2021   10:55 AM 08/07/2021    1:43 AM  CMP  Glucose 70 - 99 mg/dL 83  90  139   BUN 8 - 23 mg/dL '12  15  10    '$ Creatinine 0.44 - 1.00 mg/dL 0.99  1.10  0.85   Sodium 135 - 145 mmol/L 139  140  137   Potassium 3.5 - 5.1 mmol/L 4.3  4.1  4.3   Chloride 98 - 111 mmol/L 106  106  109   CO2 22 - 32 mmol/L '25  27  20   '$ Calcium 8.9 - 10.3 mg/dL 9.7  9.9  8.6   Total Protein 6.5 - 8.1 g/dL 8.1  8.2    Total Bilirubin 0.3 - 1.2 mg/dL 0.5  0.5    Alkaline Phos 38 - 126 U/L 64  69    AST 15 - 41 U/L 19  20    ALT 0 - 44 U/L 11  15        Latest Ref Rng & Units 01/17/2022    6:30 PM 12/30/2021   10:55 AM 08/07/2021    1:43 AM  CBC  WBC 4.0 - 10.5 K/uL 6.1  7.3  8.3   Hemoglobin 12.0 - 15.0 g/dL 13.3  15.1  12.1   Hematocrit 36.0 - 46.0 % 44.2  48.9  39.5   Platelets 150 - 400 K/uL 218  197  188     Lipid Panel Recent Labs    06/06/21 1012  CHOL 155  TRIG 107  LDLCALC 87  HDL 48   HEMOGLOBIN A1C No results found for: "HGBA1C", "MPG" TSH Recent Labs    01/17/22 1602  TSH 0.577   Medications and allergies   Allergies  Allergen Reactions   Codeine Nausea And Vomiting    Weakness and dysequilibrium   Statins Hives    Myalgias      FINAL MEDICATION AS OF TODAY:    Current Outpatient Medications:    ammonium lactate (LAC-HYDRIN) 12 % lotion, Apply 1 application topically as needed for dry skin., Disp: , Rfl:    amoxicillin (AMOXIL) 500 MG tablet, Take 1 tablet (500 mg total) by mouth as directed. Take 4 tablets by mouth 1 hour prior to dental work and cleanings, Disp: 12 tablet, Rfl: 12   aspirin EC 81 MG tablet, Take 81 mg by mouth daily. Swallow whole., Disp: , Rfl:    Cholecalciferol (VITAMIN D3) 250 MCG (10000 UT) capsule, Take 4,000 Units by mouth daily., Disp: , Rfl:  etanercept (ENBREL) 50 MG/ML injection, Inject 50 mg into the skin every Sunday. , Disp: , Rfl:    ezetimibe (ZETIA) 10 MG tablet, Take 1 tablet (10 mg total) by mouth daily., Disp: 90 tablet, Rfl: 1   HYDROcodone-acetaminophen (NORCO) 5-325 MG tablet, Take 1 tablet by mouth 2 (two) times daily., Disp: 60 tablet,  Rfl: 0   levothyroxine (SYNTHROID) 88 MCG tablet, TAKE 1 TABLET BY MOUTH ONCE DAILY 1/2 HOUR BEFORE  BREAKFAST ON AN EMPTY  STOMACH FOR THYROID, Disp: 90 tablet, Rfl: 3   simvastatin (ZOCOR) 10 MG tablet, Take 1 tablet (10 mg total) by mouth as directed. 1 tab alternate with 2 tablets in evening, Disp: 135 tablet, Rfl: 3   Radiology:   No results found.  Cardiac Studies:   Right and left heart catheterization 04/16/2021: Hemodynamic data: RA 2/1, mean 0 mmHg.  RV 24/-2, EDP 1 mmHg.  PA 24/6, mean 14 mmHg.  PW 9/4, mean 5 mmHg. PA saturation 78%, aortic saturation 100%.  Cardiac output by Fick was 5.16 with a cardiac index of 2.98. LV: 177/2, EDP 13 mmHg.  Aorta: 137/67, mean 95 mmHg. Peak to peak gradient 33 with a mean gradient of 29.4 mmHg.  Calculated aortic valve area 0.88 cm.  Angiographic data: Left main: Mildly calcified with mild luminal irregularity. CX: Large vessel, giving origin to small marginals and continues in the AV groove.  Proximal CX has calcified 60 to 70% stenosis.  OM 3 is the largest vessel with a proximal 80% calcific stenosis.  OM 1 and 2 are very small. LAD: Large-caliber vessel, mild diffuse disease, mild calcification is evident in the proximal and mid segment.  D1 and D2 are moderate sized without significant disease. RCA: Large-caliber vessel with mild diffuse disease and mild coronary calcification.  Echocardiogram 09/04/21:   1. Left ventricular ejection fraction, by estimation, is 60 to 65%. The left ventricle has normal function. The left ventricle has no regional wall motion abnormalities. Left ventricular diastolic parameters are indeterminate.  2. Right ventricular systolic function is normal. The right ventricular size is normal.  3. The mitral valve is normal in structure. No evidence of mitral valve regurgitation. No evidence of mitral stenosis.  4. The aortic valve has been repaired/replaced. Aortic valve regurgitation is not visualized. No aortic  stenosis is present. There is a 23 mm Sapien prosthetic (TAVR) valve present in the aortic position. Echo findings are consistent with normal  structure and function of the aortic valve prosthesis. Aortic valve mean gradient measures 8.2 mmHg. Aortic valve Vmax measures 1.98 m/s.  5. The inferior vena cava is normal in size with greater than 50% respiratory variability, suggesting right atrial pressure of 3 mmHg  Carotid artery duplex 04/30/2022: Duplex suggests stenosis in the right internal carotid artery (50-69%). Duplex suggests stenosis in the right external carotid artery (<50%). Duplex suggests stenosis in the left internal carotid artery (16-49%). Antegrade right vertebral artery flow. Antegrade left vertebral artery flow. No significant change since 11/22/2020. Follow up in six months is appropriate if clinically indicated.  EKG:   EKG 05/08/2022: Normal sinus rhythm at rate of 80 bpm, LAE, normal axis, incomplete right bundle branch block.  No evidence of ischemia.  No significant change from 10/30/2021.  Assessment     ICD-10-CM   1. Asymptomatic bilateral carotid artery stenosis  I65.23     2. S/P TAVR (transcatheter aortic valve replacement)  Z95.2 EKG 12-Lead    3. Mixed hyperlipidemia  E78.2 simvastatin (ZOCOR) 10 MG  tablet      Medications Discontinued During This Encounter  Medication Reason   simvastatin (ZOCOR) 10 MG tablet Reorder     Meds ordered this encounter  Medications   simvastatin (ZOCOR) 10 MG tablet    Sig: Take 1 tablet (10 mg total) by mouth as directed. 1 tab alternate with 2 tablets in evening    Dispense:  135 tablet    Refill:  3    Requesting 1 year supply    Orders Placed This Encounter  Procedures   EKG 12-Lead    Recommendations:   Sydney Reid is a 81 y.o. Caucasian female patient with aortic stenosis, stage IIIa chronic kidney disease, hyperlipidemia, obstructive sleep apnea on CPAP, echocardiogram on 02/19/2021 revealing severe  aortic stenosis. Underwent TAVR on 08/17/2021 with placement of a 23 mm Edwards CPN valve.    She has chronic dizziness but still very independent.  She has psoriatic arthritis with marked degenerative joint disease involving her hands but still able to cook and maintain all physical activities without limitations.  Since finishing the cardiac rehab, patient has returned to full activity without any limitations and essentially has no specific symptoms today.  I reviewed the carotid artery duplex, she needs continued surveillance.  Her lipids are not at goal, she has had statin induced myalgias in the past and presently only on 10 mg of simvastatin.  She is hesitant to changing to any other drug.  She is willing to increase the dose to 10 mg and 20 mg every other day going forward.  Goal LDL at least <70.  She will need repeat lipid profile testing in 4 to 6 weeks during her complete physical examination.  Otherwise I will see her back in 6 months for follow-up.    Adrian Prows, MD, [facility] 05/08/2022, 1:41 PM Office: [phone]

## 2022-05-12 ENCOUNTER — Other Ambulatory Visit: Payer: Self-pay

## 2022-05-12 DIAGNOSIS — E782 Mixed hyperlipidemia: Secondary | ICD-10-CM

## 2022-05-12 MED ORDER — SIMVASTATIN 10 MG PO TABS: 10.0000 mg | ORAL_TABLET | ORAL | 3 refills | Status: DC

## 2022-05-15 ENCOUNTER — Encounter: Payer: Self-pay | Admitting: Physical Therapy

## 2022-05-15 ENCOUNTER — Ambulatory Visit: Payer: Medicare Other | Admitting: Physical Therapy

## 2022-05-15 DIAGNOSIS — R42 Dizziness and giddiness: Secondary | ICD-10-CM | POA: Diagnosis not present

## 2022-05-15 DIAGNOSIS — R2681 Unsteadiness on feet: Secondary | ICD-10-CM | POA: Diagnosis not present

## 2022-05-15 DIAGNOSIS — R2689 Other abnormalities of gait and mobility: Secondary | ICD-10-CM | POA: Diagnosis not present

## 2022-05-15 NOTE — Therapy (Addendum)
OUTPATIENT PHYSICAL THERAPY TREATMENT NOTE   Patient Name: Sydney Reid MRN: [id number] DOB:1941-10-16, 81 y.o., female Today's Date: 05/15/2022  PCP: PCP: [name], NP   REFERRING PROVIDER: REFERRING PROVIDER: [name], NP     Progress Note Reporting Period 04/07/22 to 05/15/22  See note below for Objective Data and Assessment of Progress/Goals.    END OF SESSION:   PT End of Session - 05/15/22 1229     Visit Number 5    Number of Visits 9    Date for PT Re-Evaluation 05/29/22    Authorization Type BCBS Medicare    Progress Note Due on Visit 35    PT Start Time 1150    PT Stop Time 1228    PT Time Calculation (min) 38 min    Equipment Utilized During Treatment Gait belt    Activity Tolerance Patient tolerated treatment well    Behavior During Therapy WFL for tasks assessed/performed                Past Medical History:  Diagnosis Date   Complication of anesthesia    Difficult intubation    20-25 years ago   H/O total knee replacement 10/28/1999   Right   High cholesterol    History of tonsillectomy and adenoidectomy 10/27/1944   History of transcatheter aortic valve replacement (TAVR) 08/06/2021   s/p Edwards 25mm S3 vis TF approach with Dr. Burt Knack and Dr. Cyndia Bent   Psoriatic arthritis ([place]) 10/27/1988   Sleep apnea with use of continuous positive airway pressure (CPAP) 10/27/2002   Thyroid disease    Past Surgical History:  Procedure Laterality Date   CARDIAC CATHETERIZATION     CATARACT EXTRACTION W/ INTRAOCULAR LENS IMPLANT Bilateral    FOOT SURGERY     fused arch in left foot   GROIN DISSECTION  08/06/2021   Procedure: GROIN EXPLORATION ;  Surgeon: Waynetta Sandy, MD;  Location: [place];  Service: Vascular;;   INTRAOPERATIVE TRANSTHORACIC ECHOCARDIOGRAM N/A 08/06/2021   Procedure: INTRAOPERATIVE TRANSTHORACIC ECHOCARDIOGRAM;  Surgeon: Early Osmond, MD;  Location: [place];  Service: Cardiovascular;  Laterality:  N/A;   LOWER EXTREMITY ANGIOGRAM  08/06/2021   Procedure: LEFT LOWER EXTREMITY ANGIOGRAM;  Surgeon: Early Osmond, MD;  Location: [place];  Service: Cardiovascular;;   PATCH ANGIOPLASTY  08/06/2021   Procedure: PATCH ANGIOPLASTY OF FEMORAL ARTERY USING LEFT GREATER SAPHENOUS VEIN;  Surgeon: Waynetta Sandy, MD;  Location: [place];  Service: Vascular;;   RIGHT/LEFT HEART CATH AND CORONARY ANGIOGRAPHY N/A 04/16/2021   Procedure: RIGHT/LEFT HEART CATH AND CORONARY ANGIOGRAPHY;  Surgeon: Adrian Prows, MD;  Location: [facility];  Service: Cardiovascular;  Laterality: N/A;   SPINE SURGERY     TONSILLECTOMY     TOTAL KNEE ARTHROPLASTY Left    TRANSCATHETER AORTIC VALVE REPLACEMENT, TRANSFEMORAL N/A 08/06/2021   Procedure: TRANSCATHETER AORTIC VALVE REPLACEMENT, TRANSFEMORAL;  Surgeon: Early Osmond, MD;  Location: [facility];  Service: Cardiovascular;  Laterality: N/A;   Patient Active Problem List   Diagnosis Date Noted   Epiretinal membrane (ERM) of right eye 08/28/2021   Fuchs' corneal dystrophy 08/28/2021   Peripheral vascular disease ([place]) 08/28/2021   Cataract, bilateral 08/28/2021   Unspecified atherosclerosis of native arteries of extremities, bilateral legs ([place]) 08/28/2021   S/P TAVR (transcatheter aortic valve replacement) 08/06/2021   Asymptomatic bilateral carotid artery stenosis 05/09/2021   Severe aortic stenosis 04/16/2021   Polycythemia vera ([place]) 02/14/2021   Osteopenia 01/02/2021   Pain in  right knee 07/04/2020   Hypothyroidism due to acquired atrophy of thyroid 05/12/2018   Dizziness and giddiness 05/12/2018   Closed fracture of proximal end of left humerus 02/25/2018   Chronic pain syndrome 07/08/2017   Psoriatic arthritis, 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[place]) 07/08/2017   Hyperlipidemia 07/14/2016   Dermatochalasis of both upper eyelids 08/31/2014   Left foot pain 10/04/2012   Lumbosacral spondylosis without myelopathy 08/07/2011   Arthropathy of cervical spine  07/19/2011   Degenerative joint disease of cervical spine 07/19/2011   Cellulitis and abscess 06/20/2010   Hypothyroidism 06/20/2010   Psoriasis with arthropathy ([place]) 06/20/2010   OSA treated with BiPAP 06/20/2010   Sialoadenitis 03/04/2007    REFERRING DIAG: REFERRING DIAG: R42 (ICD-10-CM) - Dizziness R26.89 (ICD-10-CM) - Imbalance    THERAPY DIAG:  Dizziness and giddiness  Unsteadiness on feet  Other abnormalities of gait and mobility  Rationale for Evaluation and Treatment Rehabilitation  PERTINENT HISTORY: PERTINENT HISTORY: B TKA, HLD, psoriatic arthritis, thyroid disease, cervical fusion and B foot and hand surgery where they "tried to fuse and then removed the joints" per patient's report    PRECAUTIONS: n/a  SUBJECTIVE: Husband has been having a lot of health problems. Would like to make this her last appointment as this is too much to handle right now. Feels like symptoms are better when she does her exercises.   PAIN:  Are you having pain? No   OBJECTIVE:     TODAY'S TREATMENT: 05/15/22 Activity Comments  Sitting horizontal VOR 30" C/o mild dizziness   Standing VOR vertical 30" C/o lightheadedness   Standing feet almost in romberg 30" Moderate sway  R brandt daroff c/o mild dizziness laying down and sitting up  L brandt daroff x2 L upbeating torsional nystagmus; c/o dizziness laying down and sitting up; 2nd rep negative for nystagmus   FOTO [id number]     M-CTSIB  Condition 1: Firm Surface, EO 30 Sec, Mild Sway  Condition 2: Firm Surface, EC 30 Sec, Moderate Sway  Condition 3: Foam Surface, EO 30 Sec, Moderate Sway  Condition 4: Foam Surface, EC 6 Sec, Moderate and Severe Sway      [facility] PT Assessment - 05/15/22 0001       Dynamic Gait Index   Level Surface Normal    Change in Gait Speed Normal    Gait with Horizontal Head Turns Normal    Gait with Vertical Head Turns Normal    Gait and Pivot Turn Moderate Impairment   imbalance and c/o dizziness    Step Over Obstacle Normal    Step Around Obstacles Normal    Steps Mild Impairment    Total Score 21              PATIENT EDUCATION: Education details: discussion on orthostatic measurements last session; review of HEP; discussion on 30 day hold Person educated: Patient Education method: Explanation, Demonstration, Tactile cues, Verbal cues, and Handouts Education comprehension: verbalized understanding and returned demonstration     HOME EXERCISE PROGRAM Last updated: 05/15/22 Access Code: [email or web address] URL: [email or web address] Date: 05/15/2022 Prepared by: [facility]  Exercises - Brandt-Daroff Vestibular Exercise  - 1 x daily - 5 x weekly - 2 sets - 3-5 reps - Romberg Stance with Eyes Closed  - 1 x daily - 5 x weekly - 2-3 sets - 30 sec hold - Standing Gaze Stabilization with Head Rotation  - 1 x daily - 5 x weekly - 2-3 sets - 30 sec hold -  Standing Gaze Stabilization with Head Nod  - 1 x daily - 5 x weekly - 2-3 sets - 30 sec hold     Below measures were taken at time of initial evaluation unless otherwise specified:  DIAGNOSTIC FINDINGS: none recent   COGNITION: Overall cognitive status: Within functional limits for tasks assessed             SENSATION: WFL   POSTURE: significant rounded shoulders and forward head     GAIT: Gait pattern: step through pattern, decreased step length- Right, decreased step length- Left, and trunk flexed   Assistive device utilized: None Level of assistance: Complete Independence   PATIENT SURVEYS:  FOTO [phone]     VESTIBULAR ASSESSMENT              GENERAL OBSERVATION: R eye slightly lower                                   OCULOMOTOR EXAM:                       Ocular Alignment: normal                       Ocular ROM: No Limitations                       Spontaneous Nystagmus: absent                       Gaze-Induced Nystagmus: absent                       Smooth  Pursuits:  1 saccade in inferior direction; normal for age                       Saccades:  slight undershooting to R                        Convergence/Divergence: 2 cm               VESTIBULAR - OCULAR REFLEX:                        Slow VOR: Comment: c/o wooziness horizontal and vertical; limited by cervical ROM                       VOR Cancellation: Normal                       Head-Impulse Test: HIT Right: unable d/t cervical ROM HIT Left: unable d/t cervical ROM                                                          POSITIONAL TESTING:  Right Roll Test: negative; Duration: - Left Roll Test: negative; Duration: -   Right Sidelying: negative; c/o mild wooziness upon sitting up  Left Sidelying: negative; Duration: -        M-CTSIB  Condition 1: Firm Surface, EO 30 Sec, Normal Sway  Condition 2: Firm Surface, EC 30 Sec, Mild Sway  Condition  3: Foam Surface, EO 30 Sec, Mild and Moderate Sway  Condition 4: Foam Surface, EC Not tested d/t anxiety Sec,  -  Sway                        VESTIBULAR TREATMENT:     PATIENT EDUCATION: Education details: prognosis, POC, HEP- to be performed in a corner for safety; edu on vestibular hypofunction Person educated: Patient Education method: Explanation, Demonstration, Tactile cues, Verbal cues, and Handouts Education comprehension: verbalized understanding and returned demonstration     GOALS: Goals reviewed with patient? Yes   SHORT TERM GOALS: Target date: 04/21/2022   Patient to be independent with initial HEP. Baseline: HEP initiated Goal status: MET       LONG TERM GOALS: Target date: 05/05/2022   Patient to be independent with advanced HEP. Baseline: Not yet initiated  Goal status: MET 05/15/22   Patient to report 0/10 dizziness with standing vertical and horizontal VOR for 30 seconds. Baseline: Unable Goal status: NOT MET; c/o mild dizziness 05/15/22   Patient will report 0/10 dizziness with bed mobility.   Baseline: Symptomatic  Goal status: NOT MET; c/o dizziness with brandt daroff 05/15/22   Patient to demonstrate moderate sway with M-CTSIB condition with eyes closed/foam surface in order to improve safety in environments with uneven surfaces and dim lighting. Baseline: unable Goal status: NOT MET; only able to tolerate 6 sec 05/15/22   Patient to score at least 20/24 on DGI in order to decrease risk of falls. Baseline: NT Goal status: MET   Patient to report 70% improvement in dizziness.  Baseline: - Goal status: NOT MET; patient unsure of benefit so far       ASSESSMENT:   CLINICAL IMPRESSION:  Patient arrived to session with request to discontinue therapy d/t husband's heath problems. Reports that dizziness fluctuates, thus unsure of benefit from PT thus far. Patient still symptomatic with VOR activities as well as Laruth Bouchard Daroff/bed mobility. Possible nystagmus evident with L Nestor Lewandowsky today but was negative with subsequent check. Balance testing revealed slightly improvement and this goal has been met. Reviewed HEP for max benefit at home. Patient reported understanding. Placing patient on 30 day hold at this time per her request.      OBJECTIVE IMPAIRMENTS Abnormal gait, decreased activity tolerance, decreased balance, dizziness, and postural dysfunction.    ACTIVITY LIMITATIONS bending, standing, squatting, sleeping, stairs, transfers, bed mobility, and reach over head   PARTICIPATION LIMITATIONS: meal prep, cleaning, laundry, driving, shopping, community activity, and church   PERSONAL FACTORS Age, Past/current experiences, Time since onset of injury/illness/exacerbation, and 3+ comorbidities: B TKA, HLD, psoriatic arthritis, thyroid disease, L foot surgery, cervical fusion and B foot and hand surgery where they "tried to fuse and then removed the joints" per patient's report   are also affecting patient's functional outcome.    REHAB POTENTIAL: Good   CLINICAL DECISION  MAKING: Evolving/moderate complexity   EVALUATION COMPLEXITY: Moderate     PLAN: PT FREQUENCY: 2x/week   PT DURATION: 4 weeks   PLANNED INTERVENTIONS: Therapeutic exercises, Therapeutic activity, Neuromuscular re-education, Balance training, Gait training, Patient/Family education, Joint mobilization, Stair training, Vestibular training, Canalith repositioning, Dry Needling, Electrical stimulation, Cryotherapy, Moist heat, Taping, Manual therapy, and Re-evaluation   PLAN FOR NEXT SESSION: 30 day hold at this time   Janene Harvey, PT, DPT 05/15/22 12:36 PM  [facility] [address], [place], [place] [address] Phone # [phone])  [phone] Fax # [phone]   PHYSICAL THERAPY DISCHARGE SUMMARY  Visits from Start of Care: 5  Current functional level related to goals / functional outcomes: See above clinical impression   Remaining deficits: Dizziness, imbalance, decreased functional activity tolerance   Education / Equipment: HEP  Plan: Patient agrees to discharge.  Patient goals were partially met. Patient is being discharged per her request.     Janene Harvey, PT, DPT 06/16/22 12:17 PM  [facility] at [facility] [place], [place],  [address] Phone # [phone] Fax # [phone]

## 2022-05-26 ENCOUNTER — Telehealth: Payer: Self-pay | Admitting: Cardiology

## 2022-05-26 ENCOUNTER — Encounter: Payer: Medicare Other | Admitting: Physical Therapy

## 2022-05-26 NOTE — Telephone Encounter (Signed)
Called and spoke to patient she is needing a PA for simvastatin we will look into this for patient and keep her updated

## 2022-05-26 NOTE — Telephone Encounter (Signed)
Tried calling patient no answer, unable to leave vm

## 2022-05-26 NOTE — Telephone Encounter (Signed)
Patient says prescription was sent for her, but she was told she needs a PA form filled out for it to be filled. Please call her and provide her with the details.

## 2022-05-29 ENCOUNTER — Encounter: Payer: Medicare Other | Admitting: Physical Therapy

## 2022-06-02 ENCOUNTER — Encounter: Payer: Medicare Other | Admitting: Physical Therapy

## 2022-06-05 ENCOUNTER — Encounter: Payer: Medicare Other | Admitting: Physical Therapy

## 2022-06-18 ENCOUNTER — Telehealth: Payer: Self-pay | Admitting: Internal Medicine

## 2022-06-18 ENCOUNTER — Other Ambulatory Visit: Payer: Self-pay

## 2022-06-18 ENCOUNTER — Telehealth: Payer: Self-pay | Admitting: Cardiology

## 2022-06-18 DIAGNOSIS — E782 Mixed hyperlipidemia: Secondary | ICD-10-CM

## 2022-06-18 MED ORDER — SIMVASTATIN 20 MG PO TABS: 20.0000 mg | ORAL_TABLET | ORAL | 3 refills | Status: DC

## 2022-06-18 NOTE — Telephone Encounter (Signed)
ICD-10-CM   1. Mixed hyperlipidemia  E78.2 simvastatin (ZOCOR) 20 MG tablet      Meds ordered this encounter  Medications   simvastatin (ZOCOR) 20 MG tablet    Sig: Take 1 tablet (20 mg total) by mouth as directed. Daily    Dispense:  90 tablet    Refill:  3    Requesting 1 year supply    1/2 tab alternate with 1 tablet in evening

## 2022-06-18 NOTE — Telephone Encounter (Signed)
Patient needs to discuss simvastatin prescription w/someone, she says she's having problems with it. She will not be around until after 2 PM, so if someone could call her after 2 that is preferred. Phone # [phone].

## 2022-06-18 NOTE — Telephone Encounter (Signed)
Rescheduled September appointments due to scheduling conflicts, informed patient of new scheduled appointment. Left a voicemail.

## 2022-06-23 ENCOUNTER — Telehealth: Payer: Self-pay | Admitting: Internal Medicine

## 2022-06-23 NOTE — Telephone Encounter (Signed)
Rescheduled appointment per patients request via telephone call.

## 2022-07-01 ENCOUNTER — Other Ambulatory Visit: Payer: Medicare Other

## 2022-07-01 ENCOUNTER — Other Ambulatory Visit: Payer: Self-pay | Admitting: *Deleted

## 2022-07-01 ENCOUNTER — Ambulatory Visit: Payer: Medicare Other | Admitting: Internal Medicine

## 2022-07-01 DIAGNOSIS — L4052 Psoriatic arthritis mutilans: Secondary | ICD-10-CM

## 2022-07-01 DIAGNOSIS — G894 Chronic pain syndrome: Secondary | ICD-10-CM

## 2022-07-01 MED ORDER — HYDROCODONE-ACETAMINOPHEN 5-325 MG PO TABS: 1.0000 | ORAL_TABLET | Freq: Two times a day (BID) | ORAL | 0 refills | Status: DC

## 2022-07-01 NOTE — Telephone Encounter (Signed)
Patient requested refill.  Epic LR: 04/28/2022 Contract Date: 02/18/2021 Note added to upcoming appointment to update.   Pended Rx and sent to [facility] for approval.

## 2022-07-13 ENCOUNTER — Other Ambulatory Visit: Payer: Self-pay | Admitting: Nurse Practitioner

## 2022-07-15 ENCOUNTER — Ambulatory Visit: Payer: Medicare Other | Admitting: Internal Medicine

## 2022-07-15 ENCOUNTER — Other Ambulatory Visit: Payer: Medicare Other

## 2022-07-15 ENCOUNTER — Ambulatory Visit: Payer: Medicare Other

## 2022-07-16 DIAGNOSIS — L603 Nail dystrophy: Secondary | ICD-10-CM | POA: Diagnosis not present

## 2022-07-16 DIAGNOSIS — L4052 Psoriatic arthritis mutilans: Secondary | ICD-10-CM | POA: Diagnosis not present

## 2022-07-16 DIAGNOSIS — L851 Acquired keratosis [keratoderma] palmaris et plantaris: Secondary | ICD-10-CM | POA: Diagnosis not present

## 2022-07-16 DIAGNOSIS — B351 Tinea unguium: Secondary | ICD-10-CM | POA: Diagnosis not present

## 2022-07-16 DIAGNOSIS — M792 Neuralgia and neuritis, unspecified: Secondary | ICD-10-CM | POA: Diagnosis not present

## 2022-07-16 DIAGNOSIS — I739 Peripheral vascular disease, unspecified: Secondary | ICD-10-CM | POA: Diagnosis not present

## 2022-07-22 ENCOUNTER — Inpatient Hospital Stay: Payer: Medicare Other

## 2022-07-22 ENCOUNTER — Inpatient Hospital Stay: Payer: Medicare Other | Attending: Internal Medicine

## 2022-07-22 ENCOUNTER — Inpatient Hospital Stay (HOSPITAL_BASED_OUTPATIENT_CLINIC_OR_DEPARTMENT_OTHER): Payer: Medicare Other | Admitting: Internal Medicine

## 2022-07-22 ENCOUNTER — Other Ambulatory Visit: Payer: Self-pay

## 2022-07-22 VITALS — BP 147/79 | HR 90 | Temp 97.8°F | Resp 16 | Wt 141.2 lb

## 2022-07-22 DIAGNOSIS — Z7982 Long term (current) use of aspirin: Secondary | ICD-10-CM | POA: Diagnosis not present

## 2022-07-22 DIAGNOSIS — D45 Polycythemia vera: Secondary | ICD-10-CM | POA: Diagnosis not present

## 2022-07-22 DIAGNOSIS — Z79899 Other long term (current) drug therapy: Secondary | ICD-10-CM | POA: Insufficient documentation

## 2022-07-22 LAB — CMP (CANCER CENTER ONLY)
ALT: 13 U/L (ref 0–44)
AST: 18 U/L (ref 15–41)
Albumin: 4.3 g/dL (ref 3.5–5.0)
Alkaline Phosphatase: 63 U/L (ref 38–126)
Anion gap: 5 (ref 5–15)
BUN: 13 mg/dL (ref 8–23)
CO2: 27 mmol/L (ref 22–32)
Calcium: 9.6 mg/dL (ref 8.9–10.3)
Chloride: 108 mmol/L (ref 98–111)
Creatinine: 1.03 mg/dL — ABNORMAL HIGH (ref 0.44–1.00)
GFR, Estimated: 54 mL/min — ABNORMAL LOW (ref >60)
Glucose, Bld: 115 mg/dL — ABNORMAL HIGH (ref 70–99)
Potassium: 4.2 mmol/L (ref 3.5–5.1)
Sodium: 140 mmol/L (ref 135–145)
Total Bilirubin: 0.9 mg/dL (ref 0.3–1.2)
Total Protein: 7.9 g/dL (ref 6.5–8.1)

## 2022-07-22 LAB — CBC WITH DIFFERENTIAL (CANCER CENTER ONLY)
Abs Immature Granulocytes: 0.01 10*3/uL (ref 0.00–0.07)
Basophils Absolute: 0 10*3/uL (ref 0.0–0.1)
Basophils Relative: 0 %
Eosinophils Absolute: 0 10*3/uL (ref 0.0–0.5)
Eosinophils Relative: 0 %
HCT: 46.6 % — ABNORMAL HIGH (ref 36.0–46.0)
Hemoglobin: 14.8 g/dL (ref 12.0–15.0)
Immature Granulocytes: 0 %
Lymphocytes Relative: 27 %
Lymphs Abs: 1.7 10*3/uL (ref 0.7–4.0)
MCH: 26.4 pg (ref 26.0–34.0)
MCHC: 31.8 g/dL (ref 30.0–36.0)
MCV: 83.1 fL (ref 80.0–100.0)
Monocytes Absolute: 0.6 10*3/uL (ref 0.1–1.0)
Monocytes Relative: 10 %
Neutro Abs: 3.9 10*3/uL (ref 1.7–7.7)
Neutrophils Relative %: 63 %
Platelet Count: 195 10*3/uL (ref 150–400)
RBC: 5.61 MIL/uL — ABNORMAL HIGH (ref 3.87–5.11)
RDW: 15.1 % (ref 11.5–15.5)
WBC Count: 6.2 10*3/uL (ref 4.0–10.5)
nRBC: 0 % (ref 0.0–0.2)

## 2022-07-22 NOTE — Progress Notes (Signed)
[facility] [phone]   [phone]  OFFICE PROGRESS NOTE  Sydney Chandler, NP [place] [address]  DIAGNOSIS: Polycythemia vera with positive JAK2 mutation diagnosed in April 2022  PRIOR THERAPY: None  CURRENT THERAPY: Phlebotomy on as-needed basis  INTERVAL HISTORY: Sydney Reid 81 y.o. female returns to the clinic today for follow-up visit.  The patient is dealing with a tough situation with her husband who holds battling stage IV melanoma and currently receiving radiation.  He has been considering palliative care.  He is taking care of by Dr. Irene Limbo.  She is complaining of being tired from dealing with her phlebotomy as well as taking care of the husband.  She denied having any chest pain, shortness of breath, cough or hemoptysis.  She has no nausea, vomiting, diarrhea or constipation.  She has no headache or visual changes.  She is here today for evaluation and repeat blood work.  MEDICAL HISTORY: Past Medical History:  Diagnosis Date   Complication of anesthesia    Difficult intubation    20-25 years ago   H/O total knee replacement 10/28/1999   Right   High cholesterol    History of tonsillectomy and adenoidectomy 10/27/1944   History of transcatheter aortic valve replacement (TAVR) 08/06/2021   s/p Edwards 73m S3 vis TF approach with Dr. CBurt Knackand Dr. BCyndia Bent  Psoriatic arthritis ([facility] 10/27/1988   Sleep apnea with use of continuous positive airway pressure (CPAP) 10/27/2002   Thyroid disease     ALLERGIES:  is allergic to codeine and statins.  MEDICATIONS:  Current Outpatient Medications  Medication Sig Dispense Refill   ammonium lactate (LAC-HYDRIN) 12 % lotion Apply 1 application topically as needed for dry skin.     amoxicillin (AMOXIL) 500 MG tablet Take 1 tablet (500 mg total) by mouth as directed. Take 4 tablets by mouth 1 hour prior to dental work and cleanings 12 tablet 12   aspirin EC 81  MG tablet Take 81 mg by mouth daily. Swallow whole.     Cholecalciferol (VITAMIN D3) 250 MCG (10000 UT) capsule Take 4,000 Units by mouth daily.     etanercept (ENBREL) 50 MG/ML injection Inject 50 mg into the skin every Sunday.      ezetimibe (ZETIA) 10 MG tablet TAKE 1 TABLET BY MOUTH  DAILY 90 tablet 0   HYDROcodone-acetaminophen (NORCO) 5-325 MG tablet Take 1 tablet by mouth 2 (two) times daily. 60 tablet 0   levothyroxine (SYNTHROID) 88 MCG tablet TAKE 1 TABLET BY MOUTH ONCE DAILY 1/2 HOUR BEFORE  BREAKFAST ON AN EMPTY  STOMACH FOR THYROID 90 tablet 3   simvastatin (ZOCOR) 20 MG tablet Take 1 tablet (20 mg total) by mouth as directed. Daily 90 tablet 3   No current facility-administered medications for this visit.    SURGICAL HISTORY:  Past Surgical History:  Procedure Laterality Date   CARDIAC CATHETERIZATION     CATARACT EXTRACTION W/ INTRAOCULAR LENS IMPLANT Bilateral    FOOT SURGERY     fused arch in left foot   GROIN DISSECTION  08/06/2021   Procedure: GROIN EXPLORATION ;  Surgeon: CWaynetta Sandy MD;  Location: [place]  Service: Vascular;;   INTRAOPERATIVE TRANSTHORACIC ECHOCARDIOGRAM N/A 08/06/2021   Procedure: INTRAOPERATIVE TRANSTHORACIC ECHOCARDIOGRAM;  Surgeon: TEarly Osmond MD;  Location: [facility]  Service: Cardiovascular;  Laterality: N/A;   LOWER EXTREMITY ANGIOGRAM  08/06/2021   Procedure: LEFT LOWER EXTREMITY ANGIOGRAM;  Surgeon: Early Osmond, MD;  Location: [place];  Service: Cardiovascular;;   PATCH ANGIOPLASTY  08/06/2021   Procedure: PATCH ANGIOPLASTY OF FEMORAL ARTERY USING LEFT GREATER SAPHENOUS VEIN;  Surgeon: Waynetta Sandy, MD;  Location: [place];  Service: Vascular;;   RIGHT/LEFT HEART CATH AND CORONARY ANGIOGRAPHY N/A 04/16/2021   Procedure: RIGHT/LEFT HEART CATH AND CORONARY ANGIOGRAPHY;  Surgeon: Adrian Prows, MD;  Location: [facility];  Service: Cardiovascular;  Laterality: N/A;   SPINE SURGERY     TONSILLECTOMY     TOTAL  KNEE ARTHROPLASTY Left    TRANSCATHETER AORTIC VALVE REPLACEMENT, TRANSFEMORAL N/A 08/06/2021   Procedure: TRANSCATHETER AORTIC VALVE REPLACEMENT, TRANSFEMORAL;  Surgeon: Early Osmond, MD;  Location: [place] [facility];  Service: Cardiovascular;  Laterality: N/A;    REVIEW OF SYSTEMS:  A comprehensive review of systems was negative except for: Constitutional: positive for fatigue   PHYSICAL EXAMINATION: General appearance: alert, cooperative, and no distress Head: Normocephalic, without obvious abnormality, atraumatic Neck: no adenopathy, no JVD, supple, symmetrical, trachea midline, and thyroid not enlarged, symmetric, no tenderness/mass/nodules Lymph nodes: Cervical, supraclavicular, and axillary nodes normal. Resp: clear to auscultation bilaterally Back: symmetric, no curvature. ROM normal. No CVA tenderness. Cardio: regular rate and rhythm, S1, S2 normal, no murmur, click, rub or gallop GI: soft, non-tender; bowel sounds normal; no masses,  no organomegaly Extremities: extremities normal, atraumatic, no cyanosis or edema  ECOG PERFORMANCE STATUS: 1 - Symptomatic but completely ambulatory  Blood pressure (!) 147/79, pulse 90, temperature 97.8 F (36.6 C), temperature source Oral, resp. rate 16, weight 141 lb 3.2 oz (64 kg), SpO2 99 %.  LABORATORY DATA: Lab Results  Component Value Date   WBC 6.2 07/22/2022   HGB 14.8 07/22/2022   HCT 46.6 (H) 07/22/2022   MCV 83.1 07/22/2022   PLT 195 07/22/2022      Chemistry      Component Value Date/Time   NA 139 01/17/2022 1602   NA 142 06/11/2021 1035   K 4.3 01/17/2022 1602   CL 106 01/17/2022 1602   CO2 25 01/17/2022 1602   BUN 12 01/17/2022 1602   BUN 15 06/11/2021 1035   CREATININE 0.99 01/17/2022 1602   CREATININE 1.10 (H) 12/30/2021 1055   CREATININE 1.21 (H) 03/06/2021 0856   GLU 93 04/02/2016 0000      Component Value Date/Time   CALCIUM 9.7 01/17/2022 1602   ALKPHOS 64 01/17/2022 1602   AST 19 01/17/2022 1602   AST 20  12/30/2021 1055   ALT 11 01/17/2022 1602   ALT 15 12/30/2021 1055   BILITOT 0.5 01/17/2022 1602   BILITOT 0.5 12/30/2021 1055       RADIOGRAPHIC STUDIES: No results found.   ASSESSMENT AND PLAN: This is a very pleasant 81 years old white female recently diagnosed with polycythemia vera with positive JAK2 mutation. The patient has been on phlebotomy on as-needed basis to keep her hematocrit around 45%. CBC today showed hematocrit of 46.6% but the patient is feeling tired and would like to continue without phlebotomy for now to take care of her husband. I agreed with her request and I will see her back for follow-up visit in 4 months for evaluation and repeat blood work. She was advised to call immediately if she has any other concerning symptoms in the interval.  The patient voices understanding of current disease status and treatment options and is in agreement with the current care plan.  All questions were answered. The patient knows to call the  clinic with any problems, questions or concerns. We can certainly see the patient much sooner if necessary.   Disclaimer: This note was dictated with voice recognition software. Similar sounding words can inadvertently be transcribed and may not be corrected upon review.

## 2022-07-23 ENCOUNTER — Telehealth: Payer: Self-pay | Admitting: Internal Medicine

## 2022-07-23 NOTE — Telephone Encounter (Signed)
Spoke with patient to confirm 01/23 appointment

## 2022-07-30 DIAGNOSIS — M1990 Unspecified osteoarthritis, unspecified site: Secondary | ICD-10-CM | POA: Diagnosis not present

## 2022-07-30 DIAGNOSIS — M5459 Other low back pain: Secondary | ICD-10-CM | POA: Diagnosis not present

## 2022-07-30 DIAGNOSIS — M2550 Pain in unspecified joint: Secondary | ICD-10-CM | POA: Diagnosis not present

## 2022-07-30 DIAGNOSIS — M8580 Other specified disorders of bone density and structure, unspecified site: Secondary | ICD-10-CM | POA: Diagnosis not present

## 2022-07-30 DIAGNOSIS — L4050 Arthropathic psoriasis, unspecified: Secondary | ICD-10-CM | POA: Diagnosis not present

## 2022-07-30 DIAGNOSIS — L409 Psoriasis, unspecified: Secondary | ICD-10-CM | POA: Diagnosis not present

## 2022-07-30 DIAGNOSIS — Z79899 Other long term (current) drug therapy: Secondary | ICD-10-CM | POA: Diagnosis not present

## 2022-08-05 ENCOUNTER — Telehealth: Payer: Self-pay

## 2022-08-05 ENCOUNTER — Encounter: Payer: Self-pay | Admitting: Nurse Practitioner

## 2022-08-05 ENCOUNTER — Ambulatory Visit (INDEPENDENT_AMBULATORY_CARE_PROVIDER_SITE_OTHER): Payer: Medicare Other | Admitting: Nurse Practitioner

## 2022-08-05 ENCOUNTER — Encounter: Payer: BLUE CROSS/BLUE SHIELD | Admitting: Nurse Practitioner

## 2022-08-05 DIAGNOSIS — Z0000 Encounter for general adult medical examination without abnormal findings: Secondary | ICD-10-CM

## 2022-08-05 NOTE — Telephone Encounter (Signed)
[name], foote are scheduled for a virtual visit with your provider today.    Just as we do with appointments in the office, we must obtain your consent to participate.  Your consent will be active for this visit and any virtual visit you may have with one of our providers in the next 365 days.    If you have a MyChart account, I can also send a copy of this consent to you electronically.  All virtual visits are billed to your insurance company just like a traditional visit in the office.  As this is a virtual visit, video technology does not allow for your provider to perform a traditional examination.  This may limit your provider's ability to fully assess your condition.  If your provider identifies any concerns that need to be evaluated in person or the need to arrange testing such as labs, EKG, etc, we will make arrangements to do so.    Although advances in technology are sophisticated, we cannot ensure that it will always work on either your end or our end.  If the connection with a video visit is poor, we may have to switch to a telephone visit.  With either a video or telephone visit, we are not always able to ensure that we have a secure connection.   I need to obtain your verbal consent now.   Are you willing to proceed with your visit today?   Sydney Reid has provided verbal consent on 08/05/2022 for a virtual visit (video or telephone).   Leigh Aurora Manderson, [place] 08/05/2022  10:36 AM

## 2022-08-05 NOTE — Progress Notes (Signed)
Subjective:   Sydney Reid is a 81 y.o. female who presents for Medicare Annual (Subsequent) preventive examination.  Review of Systems     Cardiac Risk Factors include: advanced age (>66mn, >>2women);hypertension;dyslipidemia;family history of premature cardiovascular disease     Objective:    There were no vitals filed for this visit. There is no height or weight on file to calculate BMI.     08/05/2022   10:22 AM 04/07/2022    2:50 PM 03/17/2022    1:29 PM 01/17/2022    2:01 PM 08/06/2021    9:17 AM 08/02/2021    1:12 PM 08/02/2021   11:06 AM  Advanced Directives  Does Patient Have a Medical Advance Directive? Yes Yes Yes No;Yes Yes Yes Yes  Type of AParamedicof [place]ad[place] will  [place]enter[place]Living will  [place]um[place] will H[place]villeLiving will HBanner ElkLiving will  Does patient want to make changes to medical advance directive? No - Patient declined No - Patient declined No - Patient declined  No - Patient declined    Copy of HFurnasin Chart? Yes - validated most recent copy scanned in chart (See row information)  No - copy requested  No - copy requested No - copy requested No - copy requested    Current Medications (verified) Outpatient Encounter Medications as of 08/05/2022  Medication Sig   ammonium lactate (LAC-HYDRIN) 12 % lotion Apply 1 application topically as needed for dry skin.   amoxicillin (AMOXIL) 500 MG tablet Take 1 tablet (500 mg total) by mouth as directed. Take 4 tablets by mouth 1 hour prior to dental work and cleanings   aspirin EC 81 MG tablet Take 81 mg by mouth daily. Swallow whole.   Cholecalciferol (VITAMIN D3) 250 MCG (10000 UT) capsule Take 4,000 Units by mouth daily.   etanercept (ENBREL) 50 MG/ML injection Inject 50 mg into the skin every Sunday.    ezetimibe (ZETIA) 10 MG tablet TAKE 1 TABLET BY MOUTH  DAILY    HYDROcodone-acetaminophen (NORCO) 5-325 MG tablet Take 1 tablet by mouth 2 (two) times daily.   levothyroxine (SYNTHROID) 88 MCG tablet TAKE 1 TABLET BY MOUTH ONCE DAILY 1/2 HOUR BEFORE  BREAKFAST ON AN EMPTY  STOMACH FOR THYROID   simvastatin (ZOCOR) 20 MG tablet Take 1 tablet (20 mg total) by mouth as directed. Daily   No facility-administered encounter medications on file as of 08/05/2022.    Allergies (verified) Codeine and Statins   History: Past Medical History:  Diagnosis Date   Complication of anesthesia    Difficult intubation    20-25 years ago   H/O total knee replacement 10/28/1999   Right   High cholesterol    History of tonsillectomy and adenoidectomy 10/27/1944   History of transcatheter aortic valve replacement (TAVR) 08/06/2021   s/p Edwards 233mS3 vis TF approach with Dr. CoBurt Knacknd Dr. BaCyndia Bent [facility]   Sleep apnea with use of continuous positive airway pressure (CPAP) 10/27/2002   Thyroid disease    Past Surgical History:  Procedure Laterality Date   CARDIAC CATHETERIZATION     CATARACT EXTRACTION W/ INTRAOCULAR LENS IMPLANT Bilateral    FOOT SURGERY     fused arch in left foot   GROIN DISSECTION  08/06/2021   Procedure: GRVirl SonXPLORATION ;  Surgeon: CaWaynetta SandyMD;  [place] [place];;   INTRAOPERATIVE TRANSTHORACIC ECHOCARDIOGRAM  N/A 08/06/2021   Procedure: INTRAOPERATIVE TRANSTHORACIC ECHOCARDIOGRAM;  Surgeon: Early Osmond, MD;  Location: [place];  Service: Cardiovascular;  Laterality: N/A;   LOWER EXTREMITY ANGIOGRAM  08/06/2021   Procedure: LEFT LOWER EXTREMITY ANGIOGRAM;  Surgeon: Early Osmond, MD;  Location: [place];  Service: Cardiovascular;;   PATCH ANGIOPLASTY  08/06/2021   Procedure: PATCH ANGIOPLASTY OF FEMORAL ARTERY USING LEFT GREATER SAPHENOUS VEIN;  Surgeon: Waynetta Sandy, MD;  Location: [place];  Service: Vascular;;   RIGHT/LEFT HEART CATH AND CORONARY ANGIOGRAPHY  N/A 04/16/2021   Procedure: RIGHT/LEFT HEART CATH AND CORONARY ANGIOGRAPHY;  Surgeon: Adrian Prows, MD;  Location: [place] [facility];  Service: Cardiovascular;  Laterality: N/A;   SPINE SURGERY     TONSILLECTOMY     TOTAL KNEE ARTHROPLASTY Left    TRANSCATHETER AORTIC VALVE REPLACEMENT, TRANSFEMORAL N/A 08/06/2021   Procedure: TRANSCATHETER AORTIC VALVE REPLACEMENT, TRANSFEMORAL;  Surgeon: Early Osmond, MD;  [facility];  Service: Cardiovascular;  Laterality: N/A;   Family History  Problem Relation Age of Onset   Heart attack Father    Heart disease Sister    Social History   Socioeconomic History   Marital status: Married    Spouse name: Not on file   Number of children: 0   Years of education: 16   Highest education level: Bachelor's degree (e.g., BA, AB, BS)  Occupational History   Not on file  Tobacco Use   Smoking status: Former    Packs/day: 2.00    Years: 18.00    Total pack years: 36.00    Types: Cigarettes    Quit date: 68    Years since quitting: 48.8   Smokeless tobacco: Never  Vaping Use   Vaping Use: Never used  Substance and Sexual Activity   Alcohol use: Yes    Alcohol/week: 1.0 - 2.0 standard drink of alcohol    Types: 1 - 2 Standard drinks or equivalent per week    Comment: Social drinker, couple times a week   Drug use: No   Sexual activity: Not on file  Other Topics Concern   Not on file  Social History Narrative   Diet? Normal      Do you drink/eat things with caffeine? Yes, 1 mt. Dew per day      Marital status?              m                      What year were you married? 1988      Do you live in a house, apartment, assisted living, condo, trailer, etc.? Townhouse      Is it one or more stories? 2 story      How many persons live in your home? 2      Do you have any pets in your home? (please list) no      Current or past profession: Is manager      Do you exercise?     No (did it for 50 years)                       Type &  how often?      Do you have a living will? yes      Do you have a DNR form?        yes  If not, do you want to discuss one?      Do you have signed POA/HPOA for forms?       Social Determinants of Health   Financial Resource Strain: Low Risk  (04/30/2018)   Overall Financial Resource Strain (CARDIA)    Difficulty of Paying Living Expenses: Not hard at all  Food Insecurity: No Food Insecurity (04/30/2018)   Hunger Vital Sign    Worried About Running Out of Food in the Last Year: Never true    Ran Out of Food in the Last Year: Never true  Transportation Needs: No Transportation Needs (04/30/2018)   PRAPARE - Hydrologist (Medical): No    Lack of Transportation (Non-Medical): No  Physical Activity: Inactive (04/30/2018)   Exercise Vital Sign    Days of Exercise per Week: 0 days    Minutes of Exercise per Session: 0 min  Stress: No Stress Concern Present (04/30/2018)   [place]    Feeling of Stress : Not at all  Social Connections: [place] (04/30/2018)   Social Connection and Isolation Panel [NHANES]    Frequency of Communication with Friends and Family: More than three times a week    Frequency of Social Gatherings with Friends and Family: Three times a week    Attends Religious Services: More than 4 times per year    Active Member of Clubs or Organizations: Yes    Attends Music therapist: More than 4 times per year    Marital Status: Married    Tobacco Counseling Counseling given: Not Answered   Clinical Intake:  Pre-visit preparation completed: Yes  Pain : No/denies pain     BMI - recorded: 25 Nutritional Status: BMI 25 -29 Overweight  How often do you need to have someone help you when you read instructions, pamphlets, or other written materials from your doctor or pharmacy?: 1 - Never  Diabetic?no         Activities of  Daily Living    08/05/2022   10:44 AM 08/06/2021    5:43 PM  In your present state of health, do you have any difficulty performing the following activities:  Hearing? 0 0  Vision? 0 0  Difficulty concentrating or making decisions? 0 0  Walking or climbing stairs? 0 0  Dressing or bathing? 0 0  Doing errands, shopping? 0 0  Preparing Food and eating ? N   Using the Toilet? N   In the past six months, have you accidently leaked urine? N   Do you have problems with loss of bowel control? N   Managing your Medications? N   Managing your Finances? N   Housekeeping or managing your Housekeeping? N     Patient Care Team: [name], NP as PCP - General (Geriatric Medicine) Tomma Rakers, MD as Referring Physician (Ophthalmology) Lahoma Rocker, MD as Consulting Physician (Rheumatology)  Indicate any recent Medical Services you may have received from other than [facility] providers in the past year (date may be approximate).     Assessment:   This is a routine wellness examination for Jorie.  Hearing/Vision screen Hearing Screening - Comments:: No hearing issues Vision Screening - Comments:: Last eye exam less than 12 months ago with Dr[facility]Carlson at [facility], will change to Dr.Hecker soon.   Dietary issues and exercise activities discussed: Current Exercise Habits: The patient does not participate in regular exercise at present   Goals Addressed  None    Depression Screen    08/05/2022   10:21 AM 03/17/2022    1:28 PM 02/28/2022    4:09 PM 01/02/2022    5:25 PM 08/01/2021    2:06 PM 05/15/2020    8:31 AM 10/17/2019    2:14 PM  PHQ 2/9 Scores  PHQ - 2 Score 0 0 0 0 0 0 0    Fall Risk    08/05/2022   10:21 AM 03/17/2022    1:27 PM 01/02/2022    3:14 PM 08/01/2021    2:08 PM 03/11/2021    1:02 PM  [place] in the past year? 1 0 '1 1 1  '$ Number falls in past yr: 0 0 0 1 0  Injury with Fall? 0 0 0 0 0  Risk for fall due to : No Fall Risks No Fall Risks History  of fall(s);Impaired balance/gait History of fall(s)   Follow up Falls evaluation completed Falls evaluation completed Falls evaluation completed Falls evaluation completed     FALL RISK PREVENTION PERTAINING TO THE HOME:  Any stairs in or around the home? Yes  If so, are there any without handrails? No  Home free of loose throw rugs in walkways, pet beds, electrical cords, etc? Yes  Adequate lighting in your home to reduce risk of falls? Yes   ASSISTIVE DEVICES UTILIZED TO PREVENT FALLS:  Life alert? No  Use of a cane, walker or w/c? No  Grab bars in the bathroom? Yes  Shower chair or bench in shower? Yes  Elevated toilet seat or a handicapped toilet? Yes   TIMED UP AND GO:  Was the test performed? No .    Cognitive Function:    05/02/2019    9:20 AM 04/30/2018   11:14 AM 04/09/2017    9:21 AM  MMSE - Mini Mental State Exam  Orientation to time '5 5 5  '$ Orientation to Place '5 5 5  '$ Registration '3 3 3  '$ Attention/ Calculation '5 5 5  '$ Recall '3 3 3  '$ Language- name 2 objects '2 2 2  '$ Language- repeat '1 1 1  '$ Language- follow 3 step command '3 3 3  '$ Language- read & follow direction '1 1 1  '$ Write a sentence '1 1 1  '$ Copy design '1 1 1  '$ Total score '30 30 30        '$ 08/05/2022   10:21 AM 08/01/2021    2:10 PM 05/15/2020    8:33 AM  6CIT Screen  What Year? 0 points 0 points 0 points  What month? 0 points 0 points 0 points  What time? 0 points 0 points 0 points  Count back from 20 0 points 0 points 0 points  Months in reverse 0 points 0 points 0 points  Repeat phrase 0 points 0 points 0 points  Total Score 0 points 0 points 0 points    Immunizations Immunization History  Administered Date(s) Administered   Fluad Quad(high Dose 65+) 09/10/2020, 08/07/2021   Influenza, High Dose Seasonal PF 07/20/2019   Influenza,inj,Quad PF,6+ Mos 07/08/2017   Influenza-Unspecified 09/13/2012, 11/09/2014, 09/26/2018   PFIZER Comirnaty(Gray Top)Covid-19 Tri-Sucrose Vaccine 01/28/2021    PFIZER(Purple Top)SARS-COV-2 Vaccination 11/13/2019, 12/04/2019, 06/21/2020   Pfizer Covid-19 Vaccine Bivalent Booster 47yr & up 09/26/2021   Pneumococcal Conjugate-13 11/23/2014   Pneumococcal Polysaccharide-23 10/27/2005   Tdap 10/27/2009   Zoster Recombinat (Shingrix) 12/12/2018, 04/04/2019    TDAP status: Due, Education has been provided regarding the importance of this vaccine. Advised may  receive this vaccine at local pharmacy or Health Dept. Aware to provide a copy of the vaccination record if obtained from local pharmacy or Health Dept. Verbalized acceptance and understanding.  Flu Vaccine status: Due, Education has been provided regarding the importance of this vaccine. Advised may receive this vaccine at local pharmacy or Health Dept. Aware to provide a copy of the vaccination record if obtained from local pharmacy or Health Dept. Verbalized acceptance and understanding.  Pneumococcal vaccine status: Up to date  Covid-19 vaccine status: Information provided on how to obtain vaccines.   Qualifies for Shingles Vaccine? Yes   Zostavax completed Yes   Shingrix Completed?: Yes  Screening Tests Health Maintenance  Topic Date Due   TETANUS/TDAP  10/28/2019   COVID-19 Vaccine (6 - Pfizer risk series) 11/21/2021   INFLUENZA VACCINE  05/27/2022   Pneumonia Vaccine 54+ Years old  Completed   DEXA SCAN  Completed   Zoster Vaccines- Shingrix  Completed   HPV VACCINES  Aged Out    Health Maintenance  Health Maintenance Due  Topic Date Due   TETANUS/TDAP  10/28/2019   COVID-19 Vaccine (6 - Pfizer risk series) 11/21/2021   INFLUENZA VACCINE  05/27/2022    Colorectal cancer screening: No longer required.   Mammogram status: No longer required due to age.  Bone Density status: Completed 3/22. Results reflect: Bone density results: OSTEOPENIA. Repeat every 2 years.  Lung Cancer Screening: (Low Dose CT Chest recommended if Age 72-80 years, 30 pack-year currently smoking OR have  quit w/in 15years.) does not qualify.   Additional Screening:  Hepatitis C Screening: does not qualify; Completed na  Vision Screening: Recommended annual ophthalmology exams for early detection of glaucoma and other disorders of the eye. Is the patient up to date with their annual eye exam?  Yes  Who is the provider or what is the name of the office in which the patient attends annual eye exams? [facility] If pt is not established with a provider, would they like to be referred to a provider to establish care? No .   Dental Screening: Recommended annual dental exams for proper oral hygiene  Community Resource Referral / Chronic Care Management: CRR required this visit?  No   CCM required this visit?  No      Plan:     I have personally reviewed and noted the following in the patient's chart:   Medical and social history Use of alcohol, tobacco or illicit drugs  Current medications and supplements including opioid prescriptions. Patient is currently taking opioid prescriptions. Information provided to patient regarding non-opioid alternatives. Patient advised to discuss non-opioid treatment plan with their provider. Functional ability and status Nutritional status Physical activity Advanced directives List of other physicians Hospitalizations, surgeries, and ER visits in previous 12 months Vitals Screenings to include cognitive, depression, and falls Referrals and appointments  In addition, I have reviewed and discussed with patient certain preventive protocols, quality metrics, and best practice recommendations. A written personalized care plan for preventive services as well as general preventive health recommendations were provided to patient.     [name], NP   08/05/2022    Virtual Visit via Telephone Note  I connected with patient 08/05/22 at 10:40 AM EDT by telephone and verified that I am speaking with the correct person using two  identifiers.  Location: Patient: home Provider: [facility]   I discussed the limitations, risks, security and privacy concerns of performing an evaluation and management service by telephone and the  availability of in person appointments. I also discussed with the patient that there may be a patient responsible charge related to this service. The patient expressed understanding and agreed to proceed.   I discussed the assessment and treatment plan with the patient. The patient was provided an opportunity to ask questions and all were answered. The patient agreed with the plan and demonstrated an understanding of the instructions.   The patient was advised to call back or seek an in-person evaluation if the symptoms worsen or if the condition fails to improve as anticipated.  I provided 15 minutes of non-face-to-face time during this encounter.  Carlos American. Harle Battiest Avs printed and mailed

## 2022-08-05 NOTE — Progress Notes (Signed)
   This service is provided via telemedicine  No vital signs collected/recorded due to the encounter was a telemedicine visit.   Location of patient (ex: home, work):  Home  Patient consents to a telephone visit: Yes, see telephone visit dated 08/05/22  Location of the provider (ex: office, home):  [place], [place]   Name of any referring provider:  N/A  Names of all persons participating in the telemedicine service and their role in the encounter:  S.Chrae B/CMA, Sherrie Mustache, NP, and Patient   Time spent on call:  8 min with medical assistant

## 2022-08-05 NOTE — Patient Instructions (Signed)
Sydney Reid , Thank you for taking time to come for your Medicare Wellness Visit. I appreciate your ongoing commitment to your health goals. Please review the following plan we discussed and let me know if I can assist you in the future.   Screening recommendations/referrals: Colonoscopy aged out Mammogram aged out Bone Density up to date Recommended yearly ophthalmology/optometry visit for glaucoma screening and checkup Recommended yearly dental visit for hygiene and checkup  Vaccinations: Influenza vaccine- due annually in September/October Pneumococcal vaccine up to date Tdap vaccine DUE- recommend to get at your local pharmacy Shingles vaccine DUE- recommend to get at your local pharmacy    Advanced directives: on file.   Conditions/risks identified: advance age,   Next appointment: yearly next in person   Preventive Care 5 Years and Older, Female Preventive care refers to lifestyle choices and visits with your health care provider that can promote health and wellness. What does preventive care include? A yearly physical exam. This is also called an annual well check. Dental exams once or twice a year. Routine eye exams. Ask your health care provider how often you should have your eyes checked. Personal lifestyle choices, including: Daily care of your teeth and gums. Regular physical activity. Eating a healthy diet. Avoiding tobacco and drug use. Limiting alcohol use. Practicing safe sex. Taking low-dose aspirin every day. Taking vitamin and mineral supplements as recommended by your health care provider. What happens during an annual well check? The services and screenings done by your health care provider during your annual well check will depend on your age, overall health, lifestyle risk factors, and family history of disease. Counseling  Your health care provider may ask you questions about your: Alcohol use. Tobacco use. Drug use. Emotional well-being. Home  and relationship well-being. Sexual activity. Eating habits. History of falls. Memory and ability to understand (cognition). Work and work Statistician. Reproductive health. Screening  You may have the following tests or measurements: Height, weight, and BMI. Blood pressure. Lipid and cholesterol levels. These may be checked every 5 years, or more frequently if you are over 17 years old. Skin check. Lung cancer screening. You may have this screening every year starting at age 65 if you have a 30-pack-year history of smoking and currently smoke or have quit within the past 15 years. Fecal occult blood test (FOBT) of the stool. You may have this test every year starting at age 24. Flexible sigmoidoscopy or colonoscopy. You may have a sigmoidoscopy every 5 years or a colonoscopy every 10 years starting at age 58. Hepatitis C blood test. Hepatitis B blood test. Sexually transmitted disease (STD) testing. Diabetes screening. This is done by checking your blood sugar (glucose) after you have not eaten for a while (fasting). You may have this done every 1-3 years. Bone density scan. This is done to screen for osteoporosis. You may have this done starting at age 3. Mammogram. This may be done every 1-2 years. Talk to your health care provider about how often you should have regular mammograms. Talk with your health care provider about your test results, treatment options, and if necessary, the need for more tests. Vaccines  Your health care provider may recommend certain vaccines, such as: Influenza vaccine. This is recommended every year. Tetanus, diphtheria, and acellular pertussis (Tdap, Td) vaccine. You may need a Td booster every 10 years. Zoster vaccine. You may need this after age 65. Pneumococcal 13-valent conjugate (PCV13) vaccine. One dose is recommended after age 27. Pneumococcal polysaccharide (PPSV23) vaccine.  One dose is recommended after age 88. Talk to your health care provider  about which screenings and vaccines you need and how often you need them. This information is not intended to replace advice given to you by your health care provider. Make sure you discuss any questions you have with your health care provider. Document Released: 11/09/2015 Document Revised: 07/02/2016 Document Reviewed: 08/14/2015 Elsevier Interactive Patient Education  2017 [place] Prevention in the Home Falls can cause injuries. They can happen to people of all ages. There are many things you can do to make your home safe and to help prevent falls. What can I do on the outside of my home? Regularly fix the edges of walkways and driveways and fix any cracks. Remove anything that might make you trip as you walk through a door, such as a raised step or threshold. Trim any bushes or trees on the path to your home. Use bright outdoor lighting. Clear any walking paths of anything that might make someone trip, such as rocks or tools. Regularly check to see if handrails are loose or broken. Make sure that both sides of any steps have handrails. Any raised decks and porches should have guardrails on the edges. Have any leaves, snow, or ice cleared regularly. Use sand or salt on walking paths during winter. Clean up any spills in your garage right away. This includes oil or grease spills. What can I do in the bathroom? Use night lights. Install grab bars by the toilet and in the tub and shower. Do not use towel bars as grab bars. Use non-skid mats or decals in the tub or shower. If you need to sit down in the shower, use a plastic, non-slip stool. Keep the floor dry. Clean up any water that spills on the floor as soon as it happens. Remove soap buildup in the tub or shower regularly. Attach bath mats securely with double-sided non-slip rug tape. Do not have throw rugs and other things on the floor that can make you trip. What can I do in the bedroom? Use night lights. Make sure  that you have a light by your bed that is easy to reach. Do not use any sheets or blankets that are too big for your bed. They should not hang down onto the floor. Have a firm chair that has side arms. You can use this for support while you get dressed. Do not have throw rugs and other things on the floor that can make you trip. What can I do in the kitchen? Clean up any spills right away. Avoid walking on wet floors. Keep items that you use a lot in easy-to-reach places. If you need to reach something above you, use a strong step stool that has a grab bar. Keep electrical cords out of the way. Do not use floor polish or wax that makes floors slippery. If you must use wax, use non-skid floor wax. Do not have throw rugs and other things on the floor that can make you trip. What can I do with my stairs? Do not leave any items on the stairs. Make sure that there are handrails on both sides of the stairs and use them. Fix handrails that are broken or loose. Make sure that handrails are as long as the stairways. Check any carpeting to make sure that it is firmly attached to the stairs. Fix any carpet that is loose or worn. Avoid having throw rugs at the top or bottom of  the stairs. If you do have throw rugs, attach them to the floor with carpet tape. Make sure that you have a light switch at the top of the stairs and the bottom of the stairs. If you do not have them, ask someone to add them for you. What else can I do to help prevent falls? Wear shoes that: Do not have high heels. Have rubber bottoms. Are comfortable and fit you well. Are closed at the toe. Do not wear sandals. If you use a stepladder: Make sure that it is fully opened. Do not climb a closed stepladder. Make sure that both sides of the stepladder are locked into place. Ask someone to hold it for you, if possible. Clearly mark and make sure that you can see: Any grab bars or handrails. First and last steps. Where the edge of  each step is. Use tools that help you move around (mobility aids) if they are needed. These include: Canes. Walkers. Scooters. Crutches. Turn on the lights when you go into a dark area. Replace any light bulbs as soon as they burn out. Set up your furniture so you have a clear path. Avoid moving your furniture around. If any of your floors are uneven, fix them. If there are any pets around you, be aware of where they are. Review your medicines with your doctor. Some medicines can make you feel dizzy. This can increase your chance of falling. Ask your doctor what other things that you can do to help prevent falls. This information is not intended to replace advice given to you by your health care provider. Make sure you discuss any questions you have with your health care provider. Document Released: 08/09/2009 Document Revised: 03/20/2016 Document Reviewed: 11/17/2014 Elsevier Interactive Patient Education  2017 [employer].

## 2022-08-26 ENCOUNTER — Telehealth (INDEPENDENT_AMBULATORY_CARE_PROVIDER_SITE_OTHER): Payer: Medicare Other | Admitting: Nurse Practitioner

## 2022-08-26 DIAGNOSIS — L4052 Psoriatic arthritis mutilans: Secondary | ICD-10-CM | POA: Diagnosis not present

## 2022-08-26 DIAGNOSIS — F321 Major depressive disorder, single episode, moderate: Secondary | ICD-10-CM

## 2022-08-26 DIAGNOSIS — G894 Chronic pain syndrome: Secondary | ICD-10-CM | POA: Diagnosis not present

## 2022-08-26 MED ORDER — TRAZODONE HCL 50 MG PO TABS: 50.0000 mg | ORAL_TABLET | Freq: Every day | ORAL | 1 refills | Status: DC

## 2022-08-26 MED ORDER — HYDROCODONE-ACETAMINOPHEN 5-325 MG PO TABS: 1.0000 | ORAL_TABLET | Freq: Two times a day (BID) | ORAL | 0 refills | Status: DC

## 2022-08-26 NOTE — Patient Instructions (Signed)
Start trazodone 1/2 tablet for 3 night then increase to whole tablet at night if needed for mood and sleep.

## 2022-08-26 NOTE — Progress Notes (Signed)
Careteam: Patient Care Team: [name], NP as PCP - General (Geriatric Medicine) Tomma Rakers, MD as Referring Physician (Ophthalmology) Lahoma Rocker, MD as Consulting Physician (Rheumatology)  Advanced Directive information    Allergies  Allergen Reactions   Codeine Nausea And Vomiting    Weakness and dysequilibrium   Statins Hives    Myalgias     Chief Complaint  Patient presents with   Acute Visit    Patient would like to have something for anxiety and refill on hydrocodone. Patient has emotional distress because of husband being on hospice.Patient has been crying for several days.     HPI: Patient is a 81 y.o. female for increase in anxiety, she has been crying for days.  She does not have a lot of family around but has a lot of church support.  Sept 27th her husband was placed on hospice.  She has never been on medication for anxiety.  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   Review of Systems:  Review of Systems  Constitutional:  Negative for chills and fever.  Musculoskeletal:  Positive for joint pain and myalgias.  Psychiatric/Behavioral:  Positive for depression. Negative for memory loss and suicidal ideas. The patient is nervous/anxious and has insomnia.     Past Medical History:  Diagnosis Date   Complication of anesthesia    Difficult intubation    20-25 years ago   H/O total knee replacement 10/28/1999   Right   High cholesterol    History of tonsillectomy and adenoidectomy 10/27/1944   History of transcatheter aortic valve replacement (TAVR) 08/06/2021   s/p Edwards 67m S3 vis TF approach with Dr. CBurt Knackand Dr. BCyndia Bent  Psoriatic arthritis ([facility] 10/27/1988   Sleep apnea with use of continuous positive airway pressure (CPAP) 10/27/2002   Thyroid disease    Past Surgical History:  Procedure Laterality Date   CARDIAC CATHETERIZATION      CATARACT EXTRACTION W/ INTRAOCULAR LENS IMPLANT Bilateral    FOOT SURGERY     fused arch in left foot   GROIN DISSECTION  08/06/2021   Procedure: GVirl SonEXPLORATION ;  Surgeon: CWaynetta Sandy MD;  [place] [facility]  Service: Vascular;;   INTRAOPERATIVE TRANSTHORACIC ECHOCARDIOGRAM N/A 08/06/2021   Procedure: INTRAOPERATIVE TRANSTHORACIC ECHOCARDIOGRAM;  Surgeon: TEarly Osmond MD;  [place] [facility]  Service: Cardiovascular;  Laterality: N/A;   LOWER EXTREMITY ANGIOGRAM  08/06/2021   Procedure: LEFT LOWER EXTREMITY ANGIOGRAM;  Surgeon: TEarly Osmond MD;  [facility] [facility]  Service: Cardiovascular;;   PATCH ANGIOPLASTY  08/06/2021   Procedure: PATCH ANGIOPLASTY OF FEMORAL ARTERY USING LEFT GREATER SAPHENOUS VEIN;  Surgeon: CWaynetta Sandy MD;  [place] [facility]  Service: Vascular;;   RIGHT/LEFT HEART CATH AND CORONARY ANGIOGRAPHY N/A 04/16/2021   Procedure: RIGHT/LEFT HEART CATH AND CORONARY ANGIOGRAPHY;  Surgeon: GAdrian Prows MD;  [facility];  Service: Cardiovascular;  Laterality: N/A;   SPINE SURGERY     TONSILLECTOMY     TOTAL KNEE ARTHROPLASTY Left    TRANSCATHETER AORTIC VALVE REPLACEMENT, TRANSFEMORAL N/A 08/06/2021   Procedure: TRANSCATHETER AORTIC VALVE REPLACEMENT, TRANSFEMORAL;  Surgeon: TEarly Osmond MD;  [place];  Service: Cardiovascular;  Laterality: N/A;   Social History:   reports that she quit smoking about 48 years ago. Her smoking use included cigarettes. She has a 36.00 pack-year smoking history. She has never used smokeless  tobacco. She reports current alcohol use of about 1.0 - 2.0 standard drink of alcohol per week. She reports that she does not use drugs.  Family History  Problem Relation Age of Onset   Heart attack Father    Heart disease Sister     Medications: Patient's Medications  New Prescriptions   No medications on file  Previous Medications   AMMONIUM LACTATE (LAC-HYDRIN) 12 % LOTION    Apply 1  application topically as needed for dry skin.   AMOXICILLIN (AMOXIL) 500 MG TABLET    Take 1 tablet (500 mg total) by mouth as directed. Take 4 tablets by mouth 1 hour prior to dental work and cleanings   ASPIRIN EC 81 MG TABLET    Take 81 mg by mouth daily. Swallow whole.   CHOLECALCIFEROL (VITAMIN D3) 250 MCG (10000 UT) CAPSULE    Take 4,000 Units by mouth daily.   ETANERCEPT (ENBREL) 50 MG/ML INJECTION    Inject 50 mg into the skin every Sunday.    EZETIMIBE (ZETIA) 10 MG TABLET    TAKE 1 TABLET BY MOUTH  DAILY   HYDROCODONE-ACETAMINOPHEN (NORCO) 5-325 MG TABLET    Take 1 tablet by mouth 2 (two) times daily.   LEVOTHYROXINE (SYNTHROID) 88 MCG TABLET    TAKE 1 TABLET BY MOUTH ONCE DAILY 1/2 HOUR BEFORE  BREAKFAST ON AN EMPTY  STOMACH FOR THYROID   SIMVASTATIN (ZOCOR) 20 MG TABLET    Take 1 tablet (20 mg total) by mouth as directed. Daily  Modified Medications   No medications on file  Discontinued Medications   No medications on file    Physical Exam:  There were no vitals filed for this visit. There is no height or weight on file to calculate BMI. Wt Readings from Last 3 Encounters:  07/22/22 141 lb 3.2 oz (64 kg)  05/08/22 146 lb 12.8 oz (66.6 kg)  03/17/22 147 lb (66.7 kg)    Physical Exam  Labs reviewed: Basic Metabolic Panel: Recent Labs    12/30/21 1055 01/17/22 1602 07/22/22 1002  NA 140 139 140  K 4.1 4.3 4.2  CL 106 106 108  CO2 '27 25 27  '$ GLUCOSE 90 83 115*  BUN '15 12 13  '$ CREATININE 1.10* 0.99 1.03*  CALCIUM 9.9 9.7 9.6  TSH  --  0.577  --    Liver Function Tests: Recent Labs    12/30/21 1055 01/17/22 1602 07/22/22 1002  AST '20 19 18  '$ ALT '15 11 13  '$ ALKPHOS 69 64 63  BILITOT 0.5 0.5 0.9  PROT 8.2* 8.1 7.9  ALBUMIN 4.3 4.3 4.3   No results for input(s): "LIPASE", "AMYLASE" in the last 8760 hours. No results for input(s): "AMMONIA" in the last 8760 hours. CBC: Recent Labs    12/30/21 1055 01/17/22 1830 07/22/22 1002  WBC 7.3 6.1 6.2  NEUTROABS  4.4 3.5 3.9  HGB 15.1* 13.3 14.8  HCT 48.9* 44.2 46.6*  MCV 82.7 82.9 83.1  PLT 197 218 195   Lipid Panel: No results for input(s): "CHOL", "HDL", "LDLCALC", "TRIG", "CHOLHDL", "LDLDIRECT" in the last 8760 hours. TSH: Recent Labs    01/17/22 1602  TSH 0.577   A1C: No results found for: "HGBA1C"   Assessment/Plan 1. Chronic pain syndrome Continues on hydrocodone PRN - HYDROcodone-acetaminophen (NORCO) 5-325 MG tablet; Take 1 tablet by mouth 2 (two) times daily.  Dispense: 60 tablet; Refill: 0  2. Psoriatic arthritis, 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[place]) Refill provided - HYDROcodone-acetaminophen (NORCO) 5-325 MG tablet; Take  1 tablet by mouth 2 (two) times daily.  Dispense: 60 tablet; Refill: 0  3. Current moderate episode of major depressive disorder without prior episode ([place]) -she is also having a hard time sleeping and a lot of anxiety.  -she was interested in ativan however educated on risk of adverse effects with use of chronic pain medication. -due to sleep issues will start trazodone, to start with 25 mg daily and increase to 50 mg after 3 days. - traZODone (DESYREL) 50 MG tablet; Take 1 tablet (50 mg total) by mouth at bedtime.  Dispense: 90 tablet; Refill: 1    Next appt: 1 month as scheduled  Anaysha Andre K. Harle Battiest  [facility] [phone]    Virtual Visit via telephone, did not wish to do face to face  I connected with patient on 08/26/22 at 11:00 AM EDT by telephone and verified that I am speaking with the correct person using two identifiers.  Location: Patient: home Provider: [place]    I discussed the limitations, risks, security and privacy concerns of performing an evaluation and management service by telephone and the availability of in person appointments. I also discussed with the patient that there may be a patient responsible charge related to this service. The patient expressed understanding and agreed to proceed.   I  discussed the assessment and treatment plan with the patient. The patient was provided an opportunity to ask questions and all were answered. The patient agreed with the plan and demonstrated an understanding of the instructions.   The patient was advised to call back or seek an in-person evaluation if the symptoms worsen or if the condition fails to improve as anticipated.  I provided 15 minutes of non-face-to-face time during this encounter.  [employer]. Harle Battiest Avs printed and mailed

## 2022-08-26 NOTE — Progress Notes (Signed)
This service is provided via telemedicine  No vital signs collected/recorded due to the encounter was a telemedicine visit.   Location of patient (ex: home, work):  Home  Patient consents to a telephone visit:  Yes, see encounter dated 08/05/2022  Location of the provider (ex: office, home):  [place]  Name of any referring provider:  N/A  Names of all persons participating in the telemedicine service and their role in the encounter:  Sherrie Mustache, Nurse Practitioner, Carroll Kinds, CMA, and patient.   Time spent on call:  8 minutes with medical assistant

## 2022-08-27 ENCOUNTER — Other Ambulatory Visit: Payer: Self-pay

## 2022-08-27 DIAGNOSIS — G894 Chronic pain syndrome: Secondary | ICD-10-CM

## 2022-08-27 DIAGNOSIS — L4052 Psoriatic arthritis mutilans: Secondary | ICD-10-CM

## 2022-08-27 NOTE — Telephone Encounter (Signed)
Patient called requesting a new rx for Norco could be send to the [facility] on [address] due to [facility] been out of the [employer]

## 2022-08-28 MED ORDER — HYDROCODONE-ACETAMINOPHEN 5-325 MG PO TABS: 1.0000 | ORAL_TABLET | Freq: Two times a day (BID) | ORAL | 0 refills | Status: DC

## 2022-09-03 ENCOUNTER — Ambulatory Visit (HOSPITAL_COMMUNITY): Payer: Medicare Other | Attending: Physician Assistant | Admitting: Cardiology

## 2022-09-03 ENCOUNTER — Ambulatory Visit (HOSPITAL_BASED_OUTPATIENT_CLINIC_OR_DEPARTMENT_OTHER): Payer: Medicare Other

## 2022-09-03 VITALS — BP 142/82 | HR 74 | Ht 63.0 in | Wt 140.2 lb

## 2022-09-03 DIAGNOSIS — I10 Essential (primary) hypertension: Secondary | ICD-10-CM | POA: Diagnosis not present

## 2022-09-03 DIAGNOSIS — Z952 Presence of prosthetic heart valve: Secondary | ICD-10-CM

## 2022-09-03 DIAGNOSIS — I350 Nonrheumatic aortic (valve) stenosis: Secondary | ICD-10-CM

## 2022-09-03 DIAGNOSIS — K2289 Other specified disease of esophagus: Secondary | ICD-10-CM

## 2022-09-03 DIAGNOSIS — I70202 Unspecified atherosclerosis of native arteries of extremities, left leg: Secondary | ICD-10-CM

## 2022-09-03 LAB — ECHOCARDIOGRAM COMPLETE
AR max vel: 0.96 cm2
AV Area VTI: 0.97 cm2
AV Area mean vel: 0.91 cm2
AV Mean grad: 14 mmHg
AV Peak grad: 24.8 mmHg
Ao pk vel: 2.49 m/s
Area-P 1/2: 4.6 cm2
S' Lateral: 2.3 cm

## 2022-09-03 NOTE — Patient Instructions (Signed)
Medication Instructions:  Your physician recommends that you continue on your current medications as directed. Please refer to the Current Medication list given to you today.  *If you need a refill on your cardiac medications before your next appointment, please call your pharmacy*   Follow-Up: At [facility], you and your health needs are our priority.  As part of our continuing mission to provide you with exceptional heart care, we have created designated Provider Care Teams.  These Care Teams include your primary Cardiologist (physician) and Advanced Practice Providers (APPs -  Physician Assistants and Nurse Practitioners) who all work together to provide you with the care you need, when you need it.  We recommend signing up for the patient portal called "MyChart".  Sign up information is provided on this After Visit Summary.  MyChart is used to connect with patients for Virtual Visits (Telemedicine).  Patients are able to view lab/test results, encounter notes, upcoming appointments, etc.  Non-urgent messages can be sent to your provider as well.   To learn more about what you can do with MyChart, go to [email or web address].    Your next appointment:   As scheduled   Provider:   Dr. Einar Gip

## 2022-09-03 NOTE — Progress Notes (Addendum)
[facility] [employer]                                     Cardiology Office Note:    Date:  09/04/2022   ID:  Sydney Reid, DOB Sep 11, 1941, MRN [id number]  PCP:  [name], NP  [facility] Cardiologist:  Dr. Einar Gip, MD   [facility] Electrophysiologist:  None   Referring MD: [name], NP   Chief Complaint  Patient presents with   Follow-up    1 year s/p TAVR   History of Present Illness:    Sydney Reid is a 81 y.o. female with a hx of psoriatic arthritis, OSA on CPAP, HLD, CAD, polycythemia vera and aortic stenosis s/p TAVR (08/06/21) who presents to clinic for one month follow up.    She has remained relatively active considering her arthritis but reports progressive exertional shortness of breath and fatigue. She has been followed by Dr. Einar Gip for aortic stenosis. Her most recent echo on 02/19/2021 showed EF 60-65% with severe AS with a mean gradient of 41 mmHg with a valve area of 0.72 cm by VTI. Cardiac catheterization showed moderate stenosis of the left circumflex with 60 to 70% proximal stenosis and an 80% calcific stenosis of a third marginal branch.  The LAD had mild nonobstructive disease.  The RCA had mild calcific plaque but no stenosis.  The mean gradient at cath was 29 mmHg with a peak to peak gradient of 33 mmHg.  Aortic valve area was calculated at 0.88 cm.  Right heart pressures and cardiac index were normal.   She was evaluated by the multidisciplinary valve team and underwent successful TAVR with a 23 mm Edwards Sapien 3 Ultra THV via the TF approach on 08/06/21. At completion of case she was having decreased sensation in her left foot with absent distal signals and angiography was performed which demonstrated occlusion of the left common femoral artery. She underwent successful left common femoral endarterectomy with vein patch angioplasty by Dr. Donzetta Matters. Post operative echo showed EF 60%,  normally functioning TAVR with a mean gradient of 12 mmHg and no PVL. She was continued on Asprin with the addition of Plavix 75 mg daily x 3 months.  On follow up she was doing well with no issues. Today she is here alone and states that she continues to do well since TAVR with no issues such as chest pain, palpitations, SOB, LE edema, orthopnea, dizziness, or syncope. Echo today with stable valve placement however AVA by VTI noted to be 0.97cm2 and mean gradient at 18mHg. Post operative echo with AVA by VTI at 0.97cm2 with mean gradient at 85mg. Clinically she is doing very well with no concerning symptoms.   Past Medical History:  Diagnosis Date   Complication of anesthesia    Difficult intubation    20-25 years ago   H/O total knee replacement 10/28/1999   Right   High cholesterol    History of tonsillectomy and adenoidectomy 10/27/1944   History of transcatheter aortic valve replacement (TAVR) 08/06/2021   s/p Edwards 2367m3 vis TF approach with Dr. CooBurt Knackd Dr. BarCyndia BentPsoriatic [facility]   Sleep apnea with use of continuous positive airway pressure (CPAP) 10/27/2002   Thyroid disease     Past Surgical History:  Procedure Laterality Date   CARDIAC CATHETERIZATION  CATARACT EXTRACTION W/ INTRAOCULAR LENS IMPLANT Bilateral    FOOT SURGERY     fused arch in left foot   GROIN DISSECTION  08/06/2021   Procedure: GROIN EXPLORATION ;  Surgeon: Waynetta Sandy, MD;  Location: [place];  Service: Vascular;;   INTRAOPERATIVE TRANSTHORACIC ECHOCARDIOGRAM N/A 08/06/2021   Procedure: INTRAOPERATIVE TRANSTHORACIC ECHOCARDIOGRAM;  Surgeon: Early Osmond, MD;  Location: [place];  Service: Cardiovascular;  Laterality: N/A;   LOWER EXTREMITY ANGIOGRAM  08/06/2021   Procedure: LEFT LOWER EXTREMITY ANGIOGRAM;  Surgeon: Early Osmond, MD;  Location: [place];  Service: Cardiovascular;;   PATCH ANGIOPLASTY  08/06/2021   Procedure: PATCH ANGIOPLASTY OF FEMORAL ARTERY  USING LEFT GREATER SAPHENOUS VEIN;  Surgeon: Waynetta Sandy, MD;  Location: [place];  Service: Vascular;;   RIGHT/LEFT HEART CATH AND CORONARY ANGIOGRAPHY N/A 04/16/2021   Procedure: RIGHT/LEFT HEART CATH AND CORONARY ANGIOGRAPHY;  Surgeon: Adrian Prows, MD;  Location: [place] [facility];  Service: Cardiovascular;  Laterality: N/A;   SPINE SURGERY     TONSILLECTOMY     TOTAL KNEE ARTHROPLASTY Left    TRANSCATHETER AORTIC VALVE REPLACEMENT, TRANSFEMORAL N/A 08/06/2021   Procedure: TRANSCATHETER AORTIC VALVE REPLACEMENT, TRANSFEMORAL;  Surgeon: Early Osmond, MD;  Location: [facility];  Service: Cardiovascular;  Laterality: N/A;    Current Medications: Current Meds  Medication Sig   amoxicillin (AMOXIL) 500 MG tablet Take 1 tablet (500 mg total) by mouth as directed. Take 4 tablets by mouth 1 hour prior to dental work and cleanings   aspirin EC 81 MG tablet Take 81 mg by mouth daily. Swallow whole.   Cholecalciferol (VITAMIN D3) 250 MCG (10000 UT) capsule Take 4,000 Units by mouth daily.   etanercept (ENBREL) 50 MG/ML injection Inject 50 mg into the skin every Sunday.    ezetimibe (ZETIA) 10 MG tablet TAKE 1 TABLET BY MOUTH  DAILY   HYDROcodone-acetaminophen (NORCO) 5-325 MG tablet Take 1 tablet by mouth 2 (two) times daily.   levothyroxine (SYNTHROID) 88 MCG tablet TAKE 1 TABLET BY MOUTH ONCE DAILY 1/2 HOUR BEFORE  BREAKFAST ON AN EMPTY  STOMACH FOR THYROID   simvastatin (ZOCOR) 20 MG tablet Take 1 tablet (20 mg total) by mouth as directed. Daily   traZODone (DESYREL) 50 MG tablet Take 1 tablet (50 mg total) by mouth at bedtime.     Allergies:   Codeine and Statins   Social History   Socioeconomic History   Marital status: Married    Spouse name: Not on file   Number of children: 0   Years of education: 16   Highest education level: Bachelor's degree (e.g., BA, AB, BS)  Occupational History   Not on file  Tobacco Use   Smoking status: Former    Packs/day: 2.00    Years:  18.00    Total pack years: 36.00    Types: Cigarettes    Quit date: 59    Years since quitting: 48.8   Smokeless tobacco: Never  Vaping Use   Vaping Use: Never used  Substance and Sexual Activity   Alcohol use: Yes    Alcohol/week: 1.0 - 2.0 standard drink of alcohol    Types: 1 - 2 Standard drinks or equivalent per week    Comment: Social drinker, couple times a week   Drug use: No   Sexual activity: Not on file  Other Topics Concern   Not on file  Social History Narrative   Diet? Normal      Do you  drink/eat things with caffeine? Yes, 1 mt. Dew per day      Marital status?              m                      What year were you married? 1988      Do you live in a house, apartment, assisted living, condo, trailer, etc.? Townhouse      Is it one or more stories? 2 story      How many persons live in your home? 2      Do you have any pets in your home? (please list) no      Current or past profession: Is manager      Do you exercise?     No (did it for 50 years)                       Type & how often?      Do you have a living will? yes      Do you have a DNR form?        yes                          If not, do you want to discuss one?      Do you have signed POA/HPOA for forms?       Social Determinants of Health   Financial Resource Strain: Low Risk  (04/30/2018)   Overall Financial Resource Strain (CARDIA)    Difficulty of Paying Living Expenses: Not hard at all  Food Insecurity: No Food Insecurity (04/30/2018)   Hunger Vital Sign    Worried About Running Out of Food in the Last Year: Never true    Ran Out of Food in the Last Year: Never true  Transportation Needs: No Transportation Needs (04/30/2018)   PRAPARE - Hydrologist (Medical): No    Lack of Transportation (Non-Medical): No  Physical Activity: Inactive (04/30/2018)   Exercise Vital Sign    Days of Exercise per Week: 0 days    Minutes of Exercise per Session: 0 min  Stress: No  Stress Concern Present (04/30/2018)   [place]    Feeling of Stress : Not at all  Social Connections: [place] (04/30/2018)   Social Connection and Isolation Panel [NHANES]    Frequency of Communication with Friends and Family: More than three times a week    Frequency of Social Gatherings with Friends and Family: Three times a week    Attends Religious Services: More than 4 times per year    Active Member of Clubs or Organizations: Yes    Attends Music therapist: More than 4 times per year    Marital Status: Married     Family History: The patient's family history includes Heart attack in her father; Heart disease in her sister.  ROS:   Please see the history of present illness.    All other systems reviewed and are negative.  EKGs/Labs/Other Studies Reviewed:    The following studies were reviewed today:  Echocardiogram 09/03/22:   1. S/P TAVR with no AI and mean gradient 14 mmHg; no significant change  from previous.   2. Left ventricular ejection fraction, by estimation, is 60 to 65%. The  left ventricle has normal function. The left  ventricle has no regional  wall motion abnormalities. Left ventricular diastolic parameters are  consistent with Grade I diastolic  dysfunction (impaired relaxation).   3. Right ventricular systolic function is normal. The right ventricular  size is normal. Tricuspid regurgitation signal is inadequate for assessing  PA pressure.   4. The mitral valve is normal in structure. Trivial mitral valve  regurgitation. No evidence of mitral stenosis.   5. The aortic valve has been repaired/replaced. Aortic valve  regurgitation is not visualized. No aortic stenosis is present. There is a  23 mm Sapien prosthetic (TAVR) valve present in the aortic position.  Procedure Date: 08/06/2021. Echo findings are  consistent with normal structure and function of the aortic  valve  prosthesis.   6. The inferior vena cava is normal in size with greater than 50%  respiratory variability, suggesting right atrial pressure of 3 mmHg.    [facility]  TAVR OPERATIVE NOTE     Date of Procedure:                08/06/2021   Preoperative Diagnosis:      Severe Aortic Stenosis    Postoperative Diagnosis:    Same    Procedure:        Transcatheter Aortic Valve Replacement - Transfemoral Approach Edwards Sapien 3 THV (size  15m, model # [id number], serial # [id number]; complicated by left femoral occlusion requiring operative repair              Co-Surgeons:  ALenna Sciara MD and BGaye Pollack MD; proctoring MSherren Mocha MD   Anesthesiologist:  COleta Mouse MD   Echocardiographer:   MTrellis Moment MD   Pre-operative Echo Findings: Severe aortic stenosis Normal left ventricular systolic function   Post-operative Echo Findings: No paravalvular leak Unchanged left ventricular systolic function _____________   Echo 08/07/21:  IMPRESSIONS   1. The aortic valve has been replaced with a 23 mm Edward Sapien Valve.  Aortic valve regurgitation is not visualized. Aortic valve mean gradient  measures 12.0 mmHg. Aortic valve acceleration time measures 60 msec. DVI  of 0.54.   2. Post procedure peak gradient 26 mm Hg, mean gradient 12 mm Hg, DVI  0.54, EOA 1.7 cm2.   3. Left ventricular ejection fraction, by estimation, is 60 to 65%. The  left ventricle has normal function. The left ventricle has no regional  wall motion abnormalities. There is mild concentric left ventricular  hypertrophy. Left ventricular diastolic  parameters are consistent with Grade I diastolic dysfunction (impaired  relaxation).   4. Right ventricular systolic function is normal. The right ventricular  size is normal. There is normal pulmonary artery systolic pressure.   5. The mitral valve is normal in structure. No evidence of mitral valve   regurgitation.   6. The inferior vena cava is normal in size with greater than 50%  respiratory variability, suggesting right atrial pressure of 3 mmHg.   Comparison(s): A prior study was performed on 08/06/2021. Stable  prosthetic valve findings.   ____________________   Echo 09/04/21 IMPRESSIONS  1. Left ventricular ejection fraction, by estimation, is 60 to 65%. The left ventricle has normal function. The left ventricle has no regional wall motion abnormalities. Left ventricular diastolic parameters are indeterminate.  2. Right ventricular systolic function is normal. The right ventricular size is normal.  3. The mitral valve is normal in structure. No evidence of mitral valve regurgitation. No evidence of mitral stenosis.  4. The aortic valve has been  repaired/replaced. Aortic valve regurgitation is not visualized. No aortic stenosis is present. There is a 23 mm Sapien prosthetic (TAVR) valve present in the aortic position. Echo findings are consistent with normal  structure and function of the aortic valve prosthesis. Aortic valve mean gradient measures 8.2 mmHg. Aortic valve Vmax measures 1.98 m/s.  5. The inferior vena cava is normal in size with greater than 50% respiratory variability, suggesting right atrial pressure of 3 mmHg.   Comparison(s): TAVR is new.  EKG:  EKG is not ordered today.    Recent Labs: 01/17/2022: TSH 0.577 07/22/2022: ALT 13; BUN 13; Creatinine 1.03; Hemoglobin 14.8; Platelet Count 195; Potassium 4.2; Sodium 140   Recent Lipid Panel    Component Value Date/Time   CHOL 155 06/06/2021 1012   TRIG 107 06/06/2021 1012   HDL 48 06/06/2021 1012   CHOLHDL 3.5 03/06/2021 0856   VLDL 23 04/09/2017 0844   LDLCALC 87 06/06/2021 1012   LDLCALC 114 (H) 03/06/2021 0856   Physical Exam:    VS:  BP (!) 142/82   Pulse 74   Ht '5\' 3"'$  (1.6 m)   Wt 140 lb 3.2 oz (63.6 kg)   SpO2 99%   BMI 24.84 kg/m     Wt Readings from Last 3 Encounters:  09/03/22 140 lb 3.2  oz (63.6 kg)  07/22/22 141 lb 3.2 oz (64 kg)  05/08/22 146 lb 12.8 oz (66.6 kg)    General: Well developed, well nourished, NAD Lungs:Clear to ausculation bilaterally. No wheezes, rales, or rhonchi. Breathing is unlabored. Cardiovascular: RRR with S1 S2. No murmurs Extremities: No edema.  Neuro: Alert and oriented. No focal deficits. No facial asymmetry. MAE spontaneously. Psych: Responds to questions appropriately with normal affect.    ASSESSMENT/PLAN:    Severe AS s/p TAVR: Doing well with NYHA class I symptoms s/p 47m S3UR TAVR 08/06/22. Post op echo with with AVA by VTI at 0.97cm2 with mean gradient at 844mg. Echo today with stable valve placement and AVA by VTI noted to be 0.97cm2 and mean gradient at 1490m. Echocardiogram today with normal LVEF and stable function of the 19m69mUR valve. Continue ASA '81mg'$  QD. Continue lifelong dental SBE. Plan regular follow up with Dr. GanjEinar Gip S/p left femoral endarterectomy with vein patch angioplasty: Doing well with no prolonged issues.    Psoriatic arthritis: Continue Enbrel   Esophageal thickening: pre TAVR CT showed "moderate to large hiatal hernia with severe thickening of the middle to distal thirds of the esophagus without discrete esophageal mass. This may simply reflect chronic reflux esophagitis, however, further evaluation with endoscopy should be considered if there is any clinical concern for esophageal metaplasia or neoplasia." This was discussed at last visit and she was clear that she is not interested in a GI eval.  Medication Adjustments/Labs and Tests Ordered: Current medicines are reviewed at length with the patient today.  Concerns regarding medicines are outlined above.  No orders of the defined types were placed in this encounter.  No orders of the defined types were placed in this encounter.   Patient Instructions  Medication Instructions:  Your physician recommends that you continue on your current medications as  directed. Please refer to the Current Medication list given to you today.  *If you need a refill on your cardiac medications before your next appointment, please call your pharmacy*   Follow-Up: [facility] and your health needs are our priority.  As part of our continuing mission to  provide you with exceptional heart care, we have created designated Provider Care Teams.  These Care Teams include your primary Cardiologist (physician) and Advanced Practice Providers (APPs -  Physician Assistants and Nurse Practitioners) who all work together to provide you with the care you need, when you need it.  We recommend signing up for the patient portal called "MyChart".  Sign up information is provided on this After Visit Summary.  MyChart is used to connect with patients for Virtual Visits (Telemedicine).  Patients are able to view lab/test results, encounter notes, upcoming appointments, etc.  Non-urgent messages can be sent to your provider as well.   To learn more about what you can do with MyChart, go to [email or web address].    Your next appointment:   As scheduled   Provider:   Dr. Einar Gip          Signed, Kathyrn Drown, NP  09/04/2022 1:10 PM    [place]

## 2022-09-22 ENCOUNTER — Ambulatory Visit: Payer: Medicare Other | Admitting: Nurse Practitioner

## 2022-10-02 DIAGNOSIS — L603 Nail dystrophy: Secondary | ICD-10-CM | POA: Diagnosis not present

## 2022-10-02 DIAGNOSIS — L84 Corns and callosities: Secondary | ICD-10-CM | POA: Diagnosis not present

## 2022-10-02 DIAGNOSIS — I739 Peripheral vascular disease, unspecified: Secondary | ICD-10-CM | POA: Diagnosis not present

## 2022-10-02 DIAGNOSIS — L4052 Psoriatic arthritis mutilans: Secondary | ICD-10-CM | POA: Diagnosis not present

## 2022-10-02 DIAGNOSIS — M792 Neuralgia and neuritis, unspecified: Secondary | ICD-10-CM | POA: Diagnosis not present

## 2022-10-02 DIAGNOSIS — B351 Tinea unguium: Secondary | ICD-10-CM | POA: Diagnosis not present

## 2022-10-06 ENCOUNTER — Ambulatory Visit (INDEPENDENT_AMBULATORY_CARE_PROVIDER_SITE_OTHER): Payer: Medicare Other | Admitting: Nurse Practitioner

## 2022-10-06 ENCOUNTER — Encounter: Payer: Self-pay | Admitting: Nurse Practitioner

## 2022-10-06 VITALS — BP 124/82 | HR 64 | Temp 97.5°F | Ht 63.0 in | Wt 138.0 lb

## 2022-10-06 DIAGNOSIS — Z23 Encounter for immunization: Secondary | ICD-10-CM

## 2022-10-06 DIAGNOSIS — G4700 Insomnia, unspecified: Secondary | ICD-10-CM

## 2022-10-06 DIAGNOSIS — E782 Mixed hyperlipidemia: Secondary | ICD-10-CM

## 2022-10-06 DIAGNOSIS — L4052 Psoriatic arthritis mutilans: Secondary | ICD-10-CM

## 2022-10-06 DIAGNOSIS — E039 Hypothyroidism, unspecified: Secondary | ICD-10-CM

## 2022-10-06 DIAGNOSIS — I70202 Unspecified atherosclerosis of native arteries of extremities, left leg: Secondary | ICD-10-CM | POA: Diagnosis not present

## 2022-10-06 DIAGNOSIS — D45 Polycythemia vera: Secondary | ICD-10-CM | POA: Diagnosis not present

## 2022-10-06 NOTE — Progress Notes (Signed)
Careteam: Patient Care Team: [name], NP as PCP - General (Geriatric Medicine) Tomma Rakers, MD as Referring Physician (Ophthalmology) Lahoma Rocker, MD as Consulting Physician (Rheumatology)  PLACE OF SERVICE:  [place]  Advanced Directive information    Allergies  Allergen Reactions   Codeine Nausea And Vomiting    Weakness and dysequilibrium   Statins Hives    Myalgias     Chief Complaint  Patient presents with   Medical Management of Chronic Issues    6 month follow-up and flu vaccine. Discuss need for td/tdap and additional covid boosters or post pone if patient refuses. NCIR verified. Moderate fall risk. Patient c/o possible finger dislocation of left hand. Patient also c/o skin issues.      HPI: Patient is a 81 y.o. female for routine follow up.  She started trazodone but fell twice so she stopped taking medication Reported she was very dizzy on medication Continues to have trouble sleeping.  She lost her husband in last month.  Does not want to start anything else.  She takes hydrocodone for her pain, due at the end of the week- Will notify us on the pharmacy to [facility].   She is having more itching and rash on her ankles, question is this is from stress.  She has dropped down on her Enbrel.  Has a tube of medicated ointment that has been helpful.   Reports cholesterol will be bad because of the amount of cookies she has been eating.    Review of Systems:  Review of Systems  Constitutional:  Negative for chills, fever and weight loss.  HENT:  Negative for tinnitus.   Respiratory:  Negative for cough, sputum production and shortness of breath.   Cardiovascular:  Negative for chest pain, palpitations and leg swelling.  Gastrointestinal:  Negative for abdominal pain, constipation, diarrhea and heartburn.  Genitourinary:  Negative for dysuria, frequency and urgency.  Musculoskeletal:  Positive  for joint pain. Negative for back pain, falls and myalgias.  Skin: Negative.   Neurological:  Positive for dizziness. Negative for headaches.  Psychiatric/Behavioral:  Negative for depression and memory loss. The patient does not have insomnia.     Past Medical History:  Diagnosis Date   Complication of anesthesia    Difficult intubation    20-25 years ago   H/O total knee replacement 10/28/1999   Right   High cholesterol    History of tonsillectomy and adenoidectomy 10/27/1944   History of transcatheter aortic valve replacement (TAVR) 08/06/2021   s/p Edwards 91m S3 vis TF approach with Dr. CBurt Knackand Dr. BCyndia Bent  Psoriatic arthritis ([facility] 10/27/1988   Sleep apnea with use of continuous positive airway pressure (CPAP) 10/27/2002   Thyroid disease    Past Surgical History:  Procedure Laterality Date   CARDIAC CATHETERIZATION     CATARACT EXTRACTION W/ INTRAOCULAR LENS IMPLANT Bilateral    FOOT SURGERY     fused arch in left foot   GROIN DISSECTION  08/06/2021   Procedure: GVirl SonEXPLORATION ;  Surgeon: CWaynetta Sandy MD;  [place]  Service: Vascular;;   INTRAOPERATIVE TRANSTHORACIC ECHOCARDIOGRAM N/A 08/06/2021   Procedure: INTRAOPERATIVE TRANSTHORACIC ECHOCARDIOGRAM;  Surgeon: TEarly Osmond MD;  [place]  Service: Cardiovascular;  Laterality: N/A;   LOWER EXTREMITY ANGIOGRAM  08/06/2021   Procedure: LEFT LOWER EXTREMITY ANGIOGRAM;  Surgeon: TEarly Osmond MD;  [place]  Service: Cardiovascular;;  PATCH ANGIOPLASTY  08/06/2021   Procedure: PATCH ANGIOPLASTY OF FEMORAL ARTERY USING LEFT GREATER SAPHENOUS VEIN;  Surgeon: Waynetta Sandy, MD;  Location: [place];  Service: Vascular;;   RIGHT/LEFT HEART CATH AND CORONARY ANGIOGRAPHY N/A 04/16/2021   Procedure: RIGHT/LEFT HEART CATH AND CORONARY ANGIOGRAPHY;  Surgeon: Adrian Prows, MD;  Location: [place] [facility];  Service: Cardiovascular;  Laterality: N/A;   SPINE SURGERY      TONSILLECTOMY     TOTAL KNEE ARTHROPLASTY Left    TRANSCATHETER AORTIC VALVE REPLACEMENT, TRANSFEMORAL N/A 08/06/2021   Procedure: TRANSCATHETER AORTIC VALVE REPLACEMENT, TRANSFEMORAL;  Surgeon: Early Osmond, MD;  Location: [place] [facility];  Service: Cardiovascular;  Laterality: N/A;   Social History:   reports that she quit smoking about 48 years ago. Her smoking use included cigarettes. She has a 36.00 pack-year smoking history. She has never used smokeless tobacco. She reports current alcohol use of about 1.0 - 2.0 standard drink of alcohol per week. She reports that she does not use drugs.  Family History  Problem Relation Age of Onset   Heart attack Father    Heart disease Sister     Medications: Patient's Medications  New Prescriptions   No medications on file  Previous Medications   AMOXICILLIN (AMOXIL) 500 MG TABLET    Take 1 tablet (500 mg total) by mouth as directed. Take 4 tablets by mouth 1 hour prior to dental work and cleanings   ASPIRIN EC 81 MG TABLET    Take 81 mg by mouth daily. Swallow whole.   CHOLECALCIFEROL (VITAMIN D3) 250 MCG (10000 UT) CAPSULE    Take 4,000 Units by mouth daily.   ETANERCEPT (ENBREL) 50 MG/ML INJECTION    Inject 50 mg into the skin every Sunday.    EZETIMIBE (ZETIA) 10 MG TABLET    TAKE 1 TABLET BY MOUTH  DAILY   HYDROCODONE-ACETAMINOPHEN (NORCO) 5-325 MG TABLET    Take 1 tablet by mouth 2 (two) times daily.   LEVOTHYROXINE (SYNTHROID) 88 MCG TABLET    TAKE 1 TABLET BY MOUTH ONCE DAILY 1/2 HOUR BEFORE  BREAKFAST ON AN EMPTY  STOMACH FOR THYROID   SIMVASTATIN (ZOCOR) 20 MG TABLET    Take 1 tablet (20 mg total) by mouth as directed. Daily  Modified Medications   No medications on file  Discontinued Medications   TRAZODONE (DESYREL) 50 MG TABLET    Take 1 tablet (50 mg total) by mouth at bedtime.    Physical Exam:  Vitals:   10/06/22 1038  BP: 124/82  Pulse: 64  Temp: (!) 97.5 F (36.4 C)  TempSrc: Temporal  SpO2: 99%  Weight: 138 lb (62.6  kg)  Height: _0  (1.6 m)   Body mass index is 24.45 kg/m. Wt Readings from Last 3 Encounters:  10/06/22 138 lb (62.6 kg)  09/03/22 140 lb 3.2 oz (63.6 kg)  07/22/22 141 lb 3.2 oz (64 kg)    Physical Exam Constitutional:      General: She is not in acute distress.    Appearance: She is well-developed. She is not diaphoretic.  HENT:     Head: Normocephalic and atraumatic.     Mouth/Throat:     Pharynx: No oropharyngeal exudate.  Eyes:     Conjunctiva/sclera: Conjunctivae normal.     Pupils: Pupils are equal, round, and reactive to light.  Cardiovascular:     Rate and Rhythm: Normal rate and regular rhythm.     Heart sounds: Normal heart sounds.  Pulmonary:  Effort: Pulmonary effort is normal.     Breath sounds: Normal breath sounds.  Abdominal:     General: Bowel sounds are normal.     Palpations: Abdomen is soft.  Musculoskeletal:     Cervical back: Normal range of motion and neck supple.     Right lower leg: No edema.     Left lower leg: No edema.  Skin:    General: Skin is warm and dry.  Neurological:     Mental Status: She is alert.  Psychiatric:        Mood and Affect: Mood normal.     Labs reviewed: Basic Metabolic Panel: Recent Labs    12/30/21 1055 01/17/22 1602 07/22/22 1002  NA 140 139 140  K 4.1 4.3 4.2  CL 106 106 108  CO2 _0 GLUCOSE 90 83 115*  BUN _1 CREATININE 1.10* 0.99 1.03*  CALCIUM 9.9 9.7 9.6  TSH  --  0.577  --    Liver Function Tests: Recent Labs    12/30/21 1055 01/17/22 1602 07/22/22 1002  AST _2 ALT _3 ALKPHOS 69 64 63  BILITOT 0.5 0.5 0.9  PROT 8.2* 8.1 7.9  ALBUMIN 4.3 4.3 4.3   No results for input(s): "LIPASE", "AMYLASE" in the last 8760 hours. No results for input(s): "AMMONIA" in the last 8760 hours. CBC: Recent Labs    12/30/21 1055 01/17/22 1830 07/22/22 1002  WBC 7.3 6.1 6.2  NEUTROABS 4.4 3.5 3.9  HGB 15.1* 13.3 14.8  HCT 48.9* 44.2 46.6*  MCV 82.7 82.9 83.1  PLT  197 218 195   Lipid Panel: No results for input(s): "CHOL", "HDL", "LDLCALC", "TRIG", "CHOLHDL", "LDLDIRECT" in the last 8760 hours. TSH: Recent Labs    01/17/22 1602  TSH 0.577   A1C: No results found for: "HGBA1C"   Assessment/Plan 1. Need for influenza vaccination - Flu Vaccine QUAD High Dose(Fluad)  2. 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  Continues on hydrocodone-apap  3. Acquired hypothyroidism -continues on synthroid  - TSH  4. Polycythemia vera ([place]) -followed by oncologist, phlebotomy PRN - CBC with Differential/Platelet  5. Mixed hyperlipidemia -continues on zetia - Lipid panel - CMP with eGFR([facility])  6. Insomnia Due to grief, ongoing, she is managing with support from family and friends   Return in about 6 months (around 04/07/2023) for routine follow up . Carlos American. [place], [facility] [phone]

## 2022-10-07 ENCOUNTER — Other Ambulatory Visit: Payer: Self-pay

## 2022-10-07 DIAGNOSIS — E039 Hypothyroidism, unspecified: Secondary | ICD-10-CM

## 2022-10-07 LAB — CBC WITH DIFFERENTIAL/PLATELET
Absolute Monocytes: 560 cells/uL (ref 200–950)
Basophils Absolute: 8 cells/uL (ref 0–200)
Basophils Relative: 0.1 %
Eosinophils Absolute: 0 cells/uL — ABNORMAL LOW (ref 15–500)
Eosinophils Relative: 0 %
HCT: 48.1 % — ABNORMAL HIGH (ref 35.0–45.0)
Hemoglobin: 15.2 g/dL (ref 11.7–15.5)
Lymphs Abs: 1680 cells/uL (ref 850–3900)
MCH: 26.6 pg — ABNORMAL LOW (ref 27.0–33.0)
MCHC: 31.6 g/dL — ABNORMAL LOW (ref 32.0–36.0)
MCV: 84.1 fL (ref 80.0–100.0)
MPV: 11.1 fL (ref 7.5–12.5)
Monocytes Relative: 7 %
Neutro Abs: 5752 cells/uL (ref 1500–7800)
Neutrophils Relative %: 71.9 %
Platelets: 224 10*3/uL (ref 140–400)
RBC: 5.72 10*6/uL — ABNORMAL HIGH (ref 3.80–5.10)
RDW: 13.1 % (ref 11.0–15.0)
Total Lymphocyte: 21 %
WBC: 8 10*3/uL (ref 3.8–10.8)

## 2022-10-07 LAB — COMPLETE METABOLIC PANEL WITH GFR
AG Ratio: 1.3 (calc) (ref 1.0–2.5)
ALT: 16 U/L (ref 6–29)
AST: 20 U/L (ref 10–35)
Albumin: 4.5 g/dL (ref 3.6–5.1)
Alkaline phosphatase (APISO): 72 U/L (ref 37–153)
BUN/Creatinine Ratio: 15 (calc) (ref 6–22)
BUN: 15 mg/dL (ref 7–25)
CO2: 29 mmol/L (ref 20–32)
Calcium: 9.5 mg/dL (ref 8.6–10.4)
Chloride: 104 mmol/L (ref 98–110)
Creat: 0.97 mg/dL — ABNORMAL HIGH (ref 0.60–0.95)
Globulin: 3.4 g/dL (calc) (ref 1.9–3.7)
Glucose, Bld: 89 mg/dL (ref 65–99)
Potassium: 4.3 mmol/L (ref 3.5–5.3)
Sodium: 139 mmol/L (ref 135–146)
Total Bilirubin: 0.7 mg/dL (ref 0.2–1.2)
Total Protein: 7.9 g/dL (ref 6.1–8.1)
eGFR: 58 mL/min/{1.73_m2} — ABNORMAL LOW (ref >=60)

## 2022-10-07 LAB — LIPID PANEL
Cholesterol: 160 mg/dL (ref <200)
HDL: 70 mg/dL (ref >=50)
LDL Cholesterol (Calc): 72 mg/dL (calc)
Non-HDL Cholesterol (Calc): 90 mg/dL (calc) (ref <130)
Total CHOL/HDL Ratio: 2.3 (calc) (ref <5.0)
Triglycerides: 96 mg/dL (ref <150)

## 2022-10-07 LAB — TSH: TSH: 0.21 mIU/L — ABNORMAL LOW (ref 0.40–4.50)

## 2022-10-08 ENCOUNTER — Other Ambulatory Visit: Payer: Self-pay

## 2022-10-08 DIAGNOSIS — G894 Chronic pain syndrome: Secondary | ICD-10-CM

## 2022-10-08 DIAGNOSIS — L4052 Psoriatic arthritis mutilans: Secondary | ICD-10-CM

## 2022-10-08 NOTE — Telephone Encounter (Signed)
Patient called and notified us that [facility] [address]. Patient also gave name of cream she needed. Medications pend and sent to PCP Dewaine Oats Carlos American, NP for approval.

## 2022-10-09 MED ORDER — HYDROCODONE-ACETAMINOPHEN 5-325 MG PO TABS: 1.0000 | ORAL_TABLET | Freq: Two times a day (BID) | ORAL | 0 refills | Status: DC

## 2022-10-09 MED ORDER — CLOTRIMAZOLE-BETAMETHASONE 1-0.05 % EX CREA: 1.0000 | TOPICAL_CREAM | Freq: Every day | CUTANEOUS | 2 refills | Status: DC

## 2022-10-09 NOTE — Telephone Encounter (Signed)
Medication refilled by PCP Eubanks, Jessica K, NP  

## 2022-10-26 ENCOUNTER — Other Ambulatory Visit: Payer: Self-pay | Admitting: Nurse Practitioner

## 2022-10-31 ENCOUNTER — Telehealth: Payer: BLUE CROSS/BLUE SHIELD | Admitting: *Deleted

## 2022-10-31 ENCOUNTER — Other Ambulatory Visit: Payer: Medicare Other

## 2022-10-31 MED ORDER — LEVOTHYROXINE SODIUM 75 MCG PO TABS: 75.0000 ug | ORAL_TABLET | Freq: Every day | ORAL | 3 refills | Status: DC

## 2022-10-31 NOTE — Telephone Encounter (Signed)
Patient notified

## 2022-10-31 NOTE — Telephone Encounter (Signed)
Yes - thank you

## 2022-10-31 NOTE — Telephone Encounter (Signed)
Medication list updated and medication sent to local pharmacy.  Tried calling patient but call will not go through.

## 2022-10-31 NOTE — Telephone Encounter (Signed)
Patient called and stated that she received a call regarding her labwork and was told that the dosage on her Levothyroxine needed to be changed and she has been waiting on the Rx from the pharmacy.   [name], NP 10/07/2022 11:14 AM EST     Cholesterol looks great, TSH is low- meaning she is getting too much thyroid medication- to reduce synthroid to 75 mcg and lets recheck TSH in 8 weeks. electrolytes, glucose, liver and kidney function are normal. Blood counts are stable.    Levothyroxine is not in Patient's current medication list.  Is this ok to add and send to pharmacy as requested by patient.   Please Advise.

## 2022-11-03 ENCOUNTER — Encounter: Payer: Self-pay | Admitting: Internal Medicine

## 2022-11-05 ENCOUNTER — Encounter: Payer: Self-pay | Admitting: Internal Medicine

## 2022-11-05 ENCOUNTER — Ambulatory Visit: Payer: Medicare Other

## 2022-11-05 DIAGNOSIS — I6523 Occlusion and stenosis of bilateral carotid arteries: Secondary | ICD-10-CM | POA: Diagnosis not present

## 2022-11-06 ENCOUNTER — Ambulatory Visit: Payer: Medicare Other | Admitting: Cardiology

## 2022-11-06 ENCOUNTER — Encounter: Payer: Self-pay | Admitting: Cardiology

## 2022-11-06 VITALS — BP 111/76 | HR 97 | Ht 64.0 in | Wt 140.0 lb

## 2022-11-06 DIAGNOSIS — N1831 Chronic kidney disease, stage 3a: Secondary | ICD-10-CM

## 2022-11-06 DIAGNOSIS — I6523 Occlusion and stenosis of bilateral carotid arteries: Secondary | ICD-10-CM | POA: Diagnosis not present

## 2022-11-06 DIAGNOSIS — I2510 Atherosclerotic heart disease of native coronary artery without angina pectoris: Secondary | ICD-10-CM | POA: Diagnosis not present

## 2022-11-06 DIAGNOSIS — Z952 Presence of prosthetic heart valve: Secondary | ICD-10-CM | POA: Diagnosis not present

## 2022-11-06 NOTE — Progress Notes (Signed)
Primary Physician/Referring:  [name], NP  Patient ID: Sydney Reid, female    DOB: Sep 22, 1941, 82 y.o.   MRN: [id number]  Chief Complaint  Patient presents with   Asymptomatic bilateral carotid artery stenosis   Hypertension   Hyperlipidemia   Follow-up    6 month   HPI:    Sydney Reid  is a 82 y.o. Caucasian female patient with aortic stenosis, stage IIIa chronic kidney disease, hyperlipidemia, asymptomatic bilateral carotid artery stenosis, obstructive sleep apnea on CPAP, severe aortic stenosis. Underwent TAVR on 08/17/2021 with placement of a 23 mm Edwards CPN valve complicated by right femoral artery occlusion SP patch angioplasty and thrombectomy.    She presents for a 24-monthoffice visit.  Presently doing well, but is grieving her husband's death who recently passed away.  Otherwise no specific symptoms today, denies chest pain, dyspnea or leg edema.       Past Medical History:  Diagnosis Date   Complication of anesthesia    Difficult intubation    20-25 years ago   H/O total knee replacement 10/28/1999   Right   High cholesterol    History of tonsillectomy and adenoidectomy 10/27/1944   History of transcatheter aortic valve replacement (TAVR) 08/06/2021   s/p Edwards 288mS3 vis TF approach with Dr. CoBurt Knacknd Dr. BaCyndia Bent [facility]   Sleep apnea with use of continuous positive airway pressure (CPAP) 10/27/2002   Thyroid disease    Social History   Tobacco Use   Smoking status: Former    Packs/day: 2.00    Years: 18.00    Total pack years: 36.00    Types: Cigarettes    Quit date: 1975    Years since quitting: 49.0   Smokeless tobacco: Never  Substance Use Topics   Alcohol use: Not Currently    Comment: Glass of wine daily   Marital Status: Married  ROS  Review of Systems  Cardiovascular:  Negative for chest pain, dyspnea on exertion and leg swelling.   Objective  Blood pressure 111/76, pulse 97, height  '5\' 4"'$  (1.626 m), weight 140 lb (63.5 kg), SpO2 98 %. Body mass index is 24.03 kg/m.     11/06/2022    2:54 PM 10/06/2022   10:38 AM 09/03/2022    1:57 PM  Vitals with BMI  Height '5\' 4"'$  '5\' 3"'$  '5\' 3"'$   Weight 140 lbs 138 lbs 140 lbs 3 oz  BMI 24.02 2454.00486.76Systolic [id number] 76 82 82  Pulse 97 64 74    Orthostatic VS for the past 72 hrs (Last 3 readings):  Patient Position BP Location Cuff Size  11/06/22 1454 Sitting Left Arm Normal     Physical Exam Constitutional:      Comments: Short stature  Neck:     Vascular: Carotid bruit (right) present. No JVD.  Cardiovascular:     Rate and Rhythm: Normal rate and regular rhythm.     Pulses: Normal pulses and intact distal pulses.     Heart sounds: Murmur heard.     Early systolic murmur is present with a grade of 1/6 at the upper right sternal border.     No gallop.  Pulmonary:     Effort: Pulmonary effort is normal.     Breath sounds: Normal breath sounds.  Abdominal:     General: Bowel sounds are normal.     Palpations: Abdomen is soft.  Musculoskeletal:  General: Deformity (bilateral atrophied fingers) present.     Right lower leg: No edema.     Left lower leg: No edema.      Laboratory examination:   Recent Labs    12/30/21 1055 01/17/22 1602 07/22/22 1002 10/06/22 1107  NA 140 139 140 139  K 4.1 4.3 4.2 4.3  CL 106 106 108 104  CO2 '27 25 27 29  '$ GLUCOSE 90 83 115* 89  BUN '15 12 13 15  '$ CREATININE 1.10* 0.99 1.03* 0.97*  CALCIUM 9.9 9.7 9.6 9.5  GFRNONAA 50* 57* 54*  --       Latest Ref Rng & Units 10/06/2022   11:07 AM 07/22/2022   10:02 AM 01/17/2022    4:02 PM  CMP  Glucose 65 - 99 mg/dL 89  115  83   BUN 7 - 25 mg/dL '15  13  12   '$ Creatinine 0.60 - 0.95 mg/dL 0.97  1.03  0.99   Sodium 135 - 146 mmol/L 139  140  139   Potassium 3.5 - 5.3 mmol/L 4.3  4.2  4.3   Chloride 98 - 110 mmol/L 104  108  106   CO2 20 - 32 mmol/L '29  27  25   '$ Calcium 8.6 - 10.4 mg/dL 9.5  9.6  9.7   Total  Protein 6.1 - 8.1 g/dL 7.9  7.9  8.1   Total Bilirubin 0.2 - 1.2 mg/dL 0.7  0.9  0.5   Alkaline Phos 38 - 126 U/L  63  64   AST 10 - 35 U/L '20  18  19   '$ ALT 6 - 29 U/L '16  13  11       '$ Latest Ref Rng & Units 10/06/2022   11:07 AM 07/22/2022   10:02 AM 01/17/2022    6:30 PM  CBC  WBC 3.8 - 10.8 Thousand/uL 8.0  6.2  6.1   Hemoglobin 11.7 - 15.5 g/dL 15.2  14.8  13.3   Hematocrit 35.0 - 45.0 % 48.1  46.6  44.2   Platelets 140 - 400 Thousand/uL 224  195  218     Lipid Panel Recent Labs    10/06/22 1107  CHOL 160  TRIG 96  LDLCALC 72  HDL 70  CHOLHDL 2.3   HEMOGLOBIN A1C No results found for: "HGBA1C", "MPG" TSH Recent Labs    01/17/22 1602 10/06/22 1107  TSH 0.577 0.21*   Medications and allergies   Allergies  Allergen Reactions   Codeine Nausea And Vomiting    Weakness and dysequilibrium   Statins Hives    Myalgias      FINAL MEDICATION AS OF TODAY:    Current Outpatient Medications:    aspirin EC 81 MG tablet, Take 81 mg by mouth daily. Swallow whole., Disp: , Rfl:    Cholecalciferol (VITAMIN D3) 250 MCG (10000 UT) capsule, Take 4,000 Units by mouth daily., Disp: , Rfl:    clotrimazole-betamethasone (LOTRISONE) cream, Apply 1 Application topically daily., Disp: 30 g, Rfl: 2   etanercept (ENBREL) 50 MG/ML injection, Inject 50 mg into the skin every Sunday. , Disp: , Rfl:    ezetimibe (ZETIA) 10 MG tablet, TAKE 1 TABLET BY MOUTH DAILY, Disp: 90 tablet, Rfl: 3   levothyroxine (SYNTHROID) 75 MCG tablet, Take 1 tablet (75 mcg total) by mouth daily before breakfast., Disp: 30 tablet, Rfl: 3   amoxicillin (AMOXIL) 500 MG tablet, Take 1 tablet (500 mg total) by mouth as directed. Take 4 tablets by  mouth 1 hour prior to dental work and cleanings (Patient not taking: Reported on 11/06/2022), Disp: 12 tablet, Rfl: 12   HYDROcodone-acetaminophen (NORCO) 5-325 MG tablet, Take 1 tablet by mouth 2 (two) times daily. (Patient not taking: Reported on 11/06/2022), Disp: 60 tablet,  Rfl: 0   Radiology:   Cardiac Studies:   Right and left heart catheterization 04/16/2021: Hemodynamic data: RA 2/1, mean 0 mmHg.  RV 24/-2, EDP 1 mmHg.  PA 24/6, mean 14 mmHg.  PW 9/4, mean 5 mmHg. PA saturation 78%, aortic saturation 100%.  Cardiac output by Fick was 5.16 with a cardiac index of 2.98. LV: 177/2, EDP 13 mmHg.  Aorta: 137/67, mean 95 mmHg. Peak to peak gradient 33 with a mean gradient of 29.4 mmHg.  Calculated aortic valve area 0.88 cm.  Angiographic data: Left main: Mildly calcified with mild luminal irregularity. CX: Large vessel, giving origin to small marginals and continues in the AV groove.  Proximal CX has calcified 60 to 70% stenosis.  OM 3 is the largest vessel with a proximal 80% calcific stenosis.  OM 1 and 2 are very small. LAD: Large-caliber vessel, mild diffuse disease, mild calcification is evident in the proximal and mid segment.  D1 and D2 are moderate sized without significant disease. RCA: Large-caliber vessel with mild diffuse disease and mild coronary calcification.    Echocardiogram 09/03/2022:   1. S/P TAVR with no AI and mean gradient 14 mmHg; no significant change from previous.  2. Left ventricular ejection fraction, by estimation, is 60 to 65%. The left ventricle has normal function. The left ventricle has no regional wall motion abnormalities. Left ventricular diastolic parameters are consistent with Grade I diastolic  dysfunction (impaired relaxation).  3. Right ventricular systolic function is normal. The right ventricular size is normal. Tricuspid regurgitation signal is inadequate for assessing PA pressure.  4. The mitral valve is normal in structure. Trivial mitral valve regurgitation. No evidence of mitral stenosis.  5. The aortic valve has been repaired/replaced. Aortic valve regurgitation is not visualized. No aortic stenosis is present. There is a 23 mm Sapien prosthetic (TAVR) valve present in the aortic position. Procedure Date:  08/06/2021. Echo findings are  consistent with normal structure and function of the aortic valve prosthesis.  6. The inferior vena cava is normal in size with greater than 50% respiratory variability, suggesting right atrial pressure of 3 mmHg.  Carotid artery duplex 11/05/2022: Duplex suggests stenosis in the right internal carotid artery (16-49%). Mild homogeneous plaque noted.  No evidence of significant stenosis in the left carotid vessels. No hemodynamically significant arterial disease in the left internal carotid artery. Compared to the study done on 04/30/2022, right ICA stenosis of 50 to 69% and left ICA stenosis of 16 to 49% now appears to have significantly regressed.  There is very minimal homogenous plaque in the carotid arteries. Follow up in one year is appropriate if clinically indicated.  EKG:   EKG 11/06/2022: Normal sinus rhythm at rate of 93 bpm, left radial enlargement, normal axis.  No evidence of ischemia, normal EKG.  Single PAC.  Compared to 05/08/2022, no significant change.  Assessment     ICD-10-CM   1. Asymptomatic bilateral carotid artery stenosis  I65.23 EKG 12-Lead    PCV CAROTID DUPLEX (BILATERAL)    2. S/P TAVR (transcatheter aortic valve replacement)  Z95.2 PCV ECHOCARDIOGRAM COMPLETE    3. Stage 3a chronic kidney disease (HCC)  N18.31     4. Coronary artery disease involving native coronary artery  of native heart without angina pectoris  I25.10       Medications Discontinued During This Encounter  Medication Reason   simvastatin (ZOCOR) 20 MG tablet Discontinued by provider   No orders of the defined types were placed in this encounter.  Orders Placed This Encounter  Procedures   EKG 12-Lead   PCV ECHOCARDIOGRAM COMPLETE    Standing Status:   Future    Standing Expiration Date:   11/07/2023   Recommendations:   Sydney Reid is a 82 y.o. Caucasian female patient with aortic stenosis, stage IIIa chronic kidney disease, hyperlipidemia,  asymptomatic bilateral carotid artery stenosis, obstructive sleep apnea on CPAP, severe aortic stenosis. Underwent TAVR on 08/17/2021 with placement of a 23 mm Edwards CPN valve complicated by right femoral artery occlusion SP patch angioplasty and thrombectomy.  This is a 64-monthoffice visit.  1. Asymptomatic bilateral carotid artery stenosis I reviewed the results of the recently performed carotid artery duplex, significant improvement in carotid disease severity.  She is presently on Zetia along with simvastatin which she has held for now as she had some rash and plans to restart simvastatin and if rash were to reappear, we may have to change her statins.  Otherwise reviewed her external labs, lipids under excellent control and she needs carotid artery surveillance in 1 year.  2. S/P TAVR (transcatheter aortic valve replacement) Aortic valve appears to be functioning normally.  Her echocardiogram from 2 months ago reviewed.  Aortic valve gradients appear to be stable.  She does need endocarditis prophylaxis.  3. Stage 3a chronic kidney disease ([place] Patient has stage IIIa chronic kidney disease.  External labs reviewed, renal function has remained stable.  4. Coronary artery disease involving native coronary artery of native heart without angina pectoris She does have 70% proximal and distal 80% stenosis of the circumflex however she remains asymptomatic without angina pectoris or heart failure.  Hence statin therapy is necessary, patient is aware of this.  She will contact me if she is unable to tolerate statins.  She has held simvastatin and continued Zetia due to a rash that she noticed and wants to see if this is related to simvastatin.  Otherwise stable from cardiac standpoint, I will see her back on annual basis.  Her husband MEmmilyn Crookewas also my patient recently passed away, I offered her my condolences.     JAdrian Prows MD, F[facility]1/08/2023, 4:34 PM Office: [phone]

## 2022-11-12 ENCOUNTER — Telehealth: Payer: Self-pay

## 2022-11-12 DIAGNOSIS — F419 Anxiety disorder, unspecified: Secondary | ICD-10-CM

## 2022-11-12 MED ORDER — PAROXETINE HCL 10 MG PO TABS: 10.0000 mg | ORAL_TABLET | Freq: Every day | ORAL | 1 refills | Status: DC

## 2022-11-12 NOTE — Telephone Encounter (Signed)
Patient made aware that medication has been sent to pharmacy and appointment made.

## 2022-11-12 NOTE — Telephone Encounter (Signed)
Patient left message on clinical intake voicemail. She stated that when she was in office last that she was told that she could have something for anxiety. She would like to know if she could ger a low dose of paxil sent to pharmacy.  Message routed to Sherrie Mustache, NP

## 2022-11-12 NOTE — Telephone Encounter (Signed)
Paxil sent to pharmacy, lets have her follow up in 4 weeks to see how she is doing on medication.

## 2022-11-18 ENCOUNTER — Inpatient Hospital Stay (HOSPITAL_BASED_OUTPATIENT_CLINIC_OR_DEPARTMENT_OTHER): Payer: Medicare Other | Admitting: Internal Medicine

## 2022-11-18 ENCOUNTER — Inpatient Hospital Stay: Payer: Medicare Other

## 2022-11-18 ENCOUNTER — Other Ambulatory Visit: Payer: Self-pay

## 2022-11-18 ENCOUNTER — Inpatient Hospital Stay: Payer: Medicare Other | Attending: Internal Medicine

## 2022-11-18 VITALS — BP 133/75 | HR 72 | Temp 97.9°F | Resp 16 | Wt 140.0 lb

## 2022-11-18 DIAGNOSIS — E079 Disorder of thyroid, unspecified: Secondary | ICD-10-CM | POA: Diagnosis not present

## 2022-11-18 DIAGNOSIS — G4730 Sleep apnea, unspecified: Secondary | ICD-10-CM | POA: Insufficient documentation

## 2022-11-18 DIAGNOSIS — Z79899 Other long term (current) drug therapy: Secondary | ICD-10-CM | POA: Diagnosis not present

## 2022-11-18 DIAGNOSIS — R634 Abnormal weight loss: Secondary | ICD-10-CM | POA: Diagnosis not present

## 2022-11-18 DIAGNOSIS — Z885 Allergy status to narcotic agent status: Secondary | ICD-10-CM | POA: Diagnosis not present

## 2022-11-18 DIAGNOSIS — D45 Polycythemia vera: Secondary | ICD-10-CM | POA: Diagnosis not present

## 2022-11-18 DIAGNOSIS — Z888 Allergy status to other drugs, medicaments and biological substances status: Secondary | ICD-10-CM | POA: Diagnosis not present

## 2022-11-18 LAB — CBC WITH DIFFERENTIAL (CANCER CENTER ONLY)
Abs Immature Granulocytes: 0.02 10*3/uL (ref 0.00–0.07)
Basophils Absolute: 0 10*3/uL (ref 0.0–0.1)
Basophils Relative: 0 %
Eosinophils Absolute: 0 10*3/uL (ref 0.0–0.5)
Eosinophils Relative: 0 %
HCT: 46.6 % — ABNORMAL HIGH (ref 36.0–46.0)
Hemoglobin: 14.6 g/dL (ref 12.0–15.0)
Immature Granulocytes: 0 %
Lymphocytes Relative: 30 %
Lymphs Abs: 1.8 10*3/uL (ref 0.7–4.0)
MCH: 26.4 pg (ref 26.0–34.0)
MCHC: 31.3 g/dL (ref 30.0–36.0)
MCV: 84.4 fL (ref 80.0–100.0)
Monocytes Absolute: 0.6 10*3/uL (ref 0.1–1.0)
Monocytes Relative: 10 %
Neutro Abs: 3.7 10*3/uL (ref 1.7–7.7)
Neutrophils Relative %: 60 %
Platelet Count: 209 10*3/uL (ref 150–400)
RBC: 5.52 MIL/uL — ABNORMAL HIGH (ref 3.87–5.11)
RDW: 14.6 % (ref 11.5–15.5)
WBC Count: 6.1 10*3/uL (ref 4.0–10.5)
nRBC: 0 % (ref 0.0–0.2)

## 2022-11-18 LAB — LACTATE DEHYDROGENASE: LDH: 188 U/L (ref 98–192)

## 2022-11-18 LAB — CMP (CANCER CENTER ONLY)
ALT: 15 U/L (ref 0–44)
AST: 19 U/L (ref 15–41)
Albumin: 4.1 g/dL (ref 3.5–5.0)
Alkaline Phosphatase: 69 U/L (ref 38–126)
Anion gap: 7 (ref 5–15)
BUN: 19 mg/dL (ref 8–23)
CO2: 27 mmol/L (ref 22–32)
Calcium: 9.6 mg/dL (ref 8.9–10.3)
Chloride: 107 mmol/L (ref 98–111)
Creatinine: 1.1 mg/dL — ABNORMAL HIGH (ref 0.44–1.00)
GFR, Estimated: 50 mL/min — ABNORMAL LOW (ref >60)
Glucose, Bld: 84 mg/dL (ref 70–99)
Potassium: 4.3 mmol/L (ref 3.5–5.1)
Sodium: 141 mmol/L (ref 135–145)
Total Bilirubin: 0.7 mg/dL (ref 0.3–1.2)
Total Protein: 7.4 g/dL (ref 6.5–8.1)

## 2022-11-18 NOTE — Progress Notes (Signed)
[place] [phone]   [phone]  OFFICE PROGRESS NOTE  [name], NP [place] [address]  DIAGNOSIS: Polycythemia vera with positive JAK2 mutation diagnosed in April 2022  PRIOR THERAPY: None  CURRENT THERAPY: Phlebotomy on as-needed basis  INTERVAL HISTORY: Sydney Reid 82 y.o. female returns to the clinic today for follow-up visit.  The patient is feeling fine today with no concerning complaints except for few pounds of weight loss after the death of her husband.  She denied having any current chest pain, shortness of breath, cough or hemoptysis.  She has no nausea, vomiting, diarrhea or constipation.  She has no headache or visual changes.  She is here today for evaluation and repeat blood work.  MEDICAL HISTORY: Past Medical History:  Diagnosis Date   Complication of anesthesia    Difficult intubation    20-25 years ago   H/O total knee replacement 10/28/1999   Right   High cholesterol    History of tonsillectomy and adenoidectomy 10/27/1944   History of transcatheter aortic valve replacement (TAVR) 08/06/2021   s/p Edwards 26m S3 vis TF approach with Dr. CBurt Knackand Dr. BCyndia Bent  Psoriatic arthritis ([facility] 10/27/1988   Sleep apnea with use of continuous positive airway pressure (CPAP) 10/27/2002   Thyroid disease     ALLERGIES:  is allergic to codeine and statins.  MEDICATIONS:  Current Outpatient Medications  Medication Sig Dispense Refill   amoxicillin (AMOXIL) 500 MG tablet Take 1 tablet (500 mg total) by mouth as directed. Take 4 tablets by mouth 1 hour prior to dental work and cleanings (Patient not taking: Reported on 11/06/2022) 12 tablet 12   aspirin EC 81 MG tablet Take 81 mg by mouth daily. Swallow whole.     Cholecalciferol (VITAMIN D3) 250 MCG (10000 UT) capsule Take 4,000 Units by mouth daily.     clotrimazole-betamethasone (LOTRISONE) cream Apply 1 Application topically daily. 30  g 2   etanercept (ENBREL) 50 MG/ML injection Inject 50 mg into the skin every Sunday.      ezetimibe (ZETIA) 10 MG tablet TAKE 1 TABLET BY MOUTH DAILY 90 tablet 3   HYDROcodone-acetaminophen (NORCO) 5-325 MG tablet Take 1 tablet by mouth 2 (two) times daily. (Patient not taking: Reported on 11/06/2022) 60 tablet 0   levothyroxine (SYNTHROID) 75 MCG tablet Take 1 tablet (75 mcg total) by mouth daily before breakfast. 30 tablet 3   PARoxetine (PAXIL) 10 MG tablet Take 1 tablet (10 mg total) by mouth daily. 30 tablet 1   No current facility-administered medications for this visit.    SURGICAL HISTORY:  Past Surgical History:  Procedure Laterality Date   CARDIAC CATHETERIZATION     CATARACT EXTRACTION W/ INTRAOCULAR LENS IMPLANT Bilateral    FOOT SURGERY     fused arch in left foot   GROIN DISSECTION  08/06/2021   Procedure: GROIN EXPLORATION ;  Surgeon: CWaynetta Sandy MD;  [facility]  Service: Vascular;;   INTRAOPERATIVE TRANSTHORACIC ECHOCARDIOGRAM N/A 08/06/2021   Procedure: INTRAOPERATIVE TRANSTHORACIC ECHOCARDIOGRAM;  Surgeon: TEarly Osmond MD;  [facility]  Service: Cardiovascular;  Laterality: N/A;   LOWER EXTREMITY ANGIOGRAM  08/06/2021   Procedure: LEFT LOWER EXTREMITY ANGIOGRAM;  Surgeon: TEarly Osmond MD;  [facility]  Service: Cardiovascular;;   PATCH ANGIOPLASTY  08/06/2021   Procedure: PATCH ANGIOPLASTY OF FEMORAL ARTERY USING LEFT GREATER SAPHENOUS VEIN;  Surgeon: CWaynetta Sandy MD;  [facility];  Service: Vascular;;   RIGHT/LEFT HEART CATH AND CORONARY ANGIOGRAPHY N/A 04/16/2021   Procedure: RIGHT/LEFT HEART CATH AND CORONARY ANGIOGRAPHY;  Surgeon: Adrian Prows, MD;  Location: [place] [facility];  Service: Cardiovascular;  Laterality: N/A;   SPINE SURGERY     TONSILLECTOMY     TOTAL KNEE ARTHROPLASTY Left    TRANSCATHETER AORTIC VALVE REPLACEMENT, TRANSFEMORAL N/A 08/06/2021   Procedure: TRANSCATHETER AORTIC VALVE  REPLACEMENT, TRANSFEMORAL;  Surgeon: Early Osmond, MD;  Location: [place];  Service: Cardiovascular;  Laterality: N/A;    REVIEW OF SYSTEMS:  A comprehensive review of systems was negative except for: Constitutional: positive for weight loss   PHYSICAL EXAMINATION: General appearance: alert, cooperative, and no distress Head: Normocephalic, without obvious abnormality, atraumatic Neck: no adenopathy, no JVD, supple, symmetrical, trachea midline, and thyroid not enlarged, symmetric, no tenderness/mass/nodules Lymph nodes: Cervical, supraclavicular, and axillary nodes normal. Resp: clear to auscultation bilaterally Back: symmetric, no curvature. ROM normal. No CVA tenderness. Cardio: regular rate and rhythm, S1, S2 normal, no murmur, click, rub or gallop GI: soft, non-tender; bowel sounds normal; no masses,  no organomegaly Extremities: extremities normal, atraumatic, no cyanosis or edema  ECOG PERFORMANCE STATUS: 1 - Symptomatic but completely ambulatory  Blood pressure 133/75, pulse 72, temperature 97.9 F (36.6 C), temperature source Oral, resp. rate 16, weight 140 lb (63.5 kg), SpO2 100 %.  LABORATORY DATA: Lab Results  Component Value Date   WBC 6.1 11/18/2022   HGB 14.6 11/18/2022   HCT 46.6 (H) 11/18/2022   MCV 84.4 11/18/2022   PLT 209 11/18/2022      Chemistry      Component Value Date/Time   NA 141 11/18/2022 1016   NA 142 06/11/2021 1035   K 4.3 11/18/2022 1016   CL 107 11/18/2022 1016   CO2 27 11/18/2022 1016   BUN 19 11/18/2022 1016   BUN 15 06/11/2021 1035   CREATININE 1.10 (H) 11/18/2022 1016   CREATININE 0.97 (H) 10/06/2022 1107   GLU 93 04/02/2016 0000      Component Value Date/Time   CALCIUM 9.6 11/18/2022 1016   ALKPHOS 69 11/18/2022 1016   AST 19 11/18/2022 1016   ALT 15 11/18/2022 1016   BILITOT 0.7 11/18/2022 1016       RADIOGRAPHIC STUDIES: PCV CAROTID DUPLEX (BILATERAL)  Result Date: 11/05/2022 Carotid artery duplex 11/05/2022: Duplex  suggests stenosis in the right internal carotid artery (16-49%). Mild homogeneous plaque noted. No evidence of significant stenosis in the left carotid vessels. No hemodynamically significant arterial disease in the left internal carotid artery. Compared to the study done on 04/30/2022, right ICA stenosis of 50 to 69% and left ICA stenosis of 16 to 49% now appears to have significantly regressed.  There is very minimal homogenous plaque in the carotid arteries. Follow up in one year is appropriate if clinically indicated.     ASSESSMENT AND PLAN: This is a very pleasant 82 years old white female recently diagnosed with polycythemia vera with positive JAK2 mutation. The patient has been on phlebotomy on as-needed basis to keep her hematocrit around 45%. The patient is feeling fine today with no concerning complaints. Repeat CBC showed hematocrit of 46.6%. I recommended for the patient to continue on observation with repeat CBC, comprehensive metabolic panel, LDH and phlebotomy if needed in 3 months. She was advised to call immediately if she has any concerning symptoms in the interval. The patient voices understanding of current disease status and treatment options and is  in agreement with the current care plan.  All questions were answered. The patient knows to call the clinic with any problems, questions or concerns. We can certainly see the patient much sooner if necessary.   Disclaimer: This note was dictated with voice recognition software. Similar sounding words can inadvertently be transcribed and may not be corrected upon review.

## 2022-11-20 ENCOUNTER — Ambulatory Visit: Payer: Medicare Other | Admitting: Internal Medicine

## 2022-11-20 ENCOUNTER — Other Ambulatory Visit: Payer: Medicare Other

## 2022-11-21 ENCOUNTER — Telehealth: Payer: Self-pay | Admitting: Internal Medicine

## 2022-11-21 NOTE — Telephone Encounter (Signed)
Called patient to confirm appointments. Left voicemail with upcoming appointment details.

## 2022-11-24 ENCOUNTER — Ambulatory Visit (INDEPENDENT_AMBULATORY_CARE_PROVIDER_SITE_OTHER): Payer: Medicare Other | Admitting: Adult Health

## 2022-11-24 ENCOUNTER — Other Ambulatory Visit: Payer: Self-pay

## 2022-11-24 ENCOUNTER — Encounter: Payer: Self-pay | Admitting: Adult Health

## 2022-11-24 VITALS — BP 116/74 | HR 75 | Temp 97.5°F | Resp 18 | Ht 64.0 in | Wt 138.0 lb

## 2022-11-24 DIAGNOSIS — I6523 Occlusion and stenosis of bilateral carotid arteries: Secondary | ICD-10-CM

## 2022-11-24 DIAGNOSIS — R21 Rash and other nonspecific skin eruption: Secondary | ICD-10-CM | POA: Diagnosis not present

## 2022-11-24 DIAGNOSIS — E039 Hypothyroidism, unspecified: Secondary | ICD-10-CM

## 2022-11-24 MED ORDER — PREDNISONE 20 MG PO TABS: 20.0000 mg | ORAL_TABLET | Freq: Every day | ORAL | 0 refills | Status: AC

## 2022-11-24 NOTE — Patient Instructions (Signed)
Rash, Adult  A rash is a change in the color of your skin. A rash can also change the way your skin feels. There are many different conditions and factors that can cause a rash. Follow these instructions at home: The goal of treatment is to stop the itching and keep the rash from spreading. Watch for any changes in your symptoms. Let your doctor know about them. Follow these instructions to help with your condition: Medicine Take or apply over-the-counter and prescription medicines only as told by your doctor. These may include medicines: To treat red or swollen skin (corticosteroid creams). To treat itching. To treat an allergy (oral antihistamines). To treat very bad symptoms (oral corticosteroids).  Skin care Put cool cloths (compresses) on the affected areas. Do not scratch or rub your skin. Avoid covering the rash. Make sure that the rash is exposed to air as much as possible. Managing itching and discomfort Avoid hot showers or baths. These can make itching worse. A cold shower may help. Try taking a bath with: Epsom salts. You can get these at your local pharmacy or grocery store. Follow the instructions on the package. Baking soda. Pour a small amount into the bath as told by your doctor. Colloidal oatmeal. You can get this at your local pharmacy or grocery store. Follow the instructions on the package. Try putting baking soda paste onto your skin. Stir water into baking soda until it gets like a paste. Try putting on a lotion that relieves itchiness (calamine lotion). Keep cool and out of the sun. Sweating and being hot can make itching worse. General instructions  Rest as needed. Drink enough fluid to keep your pee (urine) pale yellow. Wear loose-fitting clothing. Avoid scented soaps, detergents, and perfumes. Use gentle soaps, detergents, perfumes, and other cosmetic products. Avoid anything that causes your rash. Keep a journal to help track what causes your rash. Write  down: What you eat. What cosmetic products you use. What you drink. What you wear. This includes jewelry. Keep all follow-up visits as told by your doctor. This is important. Contact a doctor if: You sweat at night. You lose weight. You pee (urinate) more than normal. You pee less than normal, or you notice that your pee is a darker color than normal. You feel weak. You throw up (vomit). Your skin or the whites of your eyes look yellow (jaundice). Your skin: Tingles. Is numb. Your rash: Does not go away after a few days. Gets worse. You are: More thirsty than normal. More tired than normal. You have: New symptoms. Pain in your belly (abdomen). A fever. Watery poop (diarrhea). Get help right away if: You have a fever and your symptoms suddenly get worse. You start to feel mixed up (confused). You have a very bad headache or a stiff neck. You have very bad joint pains or stiffness. You have jerky movements that you cannot control (seizure). Your rash covers all or most of your body. The rash may or may not be painful. You have blisters that: Are on top of the rash. Grow larger. Grow together. Are painful. Are inside your nose or mouth. You have a rash that: Looks like purple pinprick-sized spots all over your body. Has a "bull's eye" or looks like a target. Is red and painful, causes your skin to peel, and is not from being in the sun too long. Summary A rash is a change in the color of your skin. A rash can also change the way your skin   feels. The goal of treatment is to stop the itching and keep the rash from spreading. Take or apply over-the-counter and prescription medicines only as told by your doctor. Contact a doctor if you have new symptoms or symptoms that get worse. Keep all follow-up visits as told by your doctor. This is important. This information is not intended to replace advice given to you by your health care provider. Make sure you discuss any  questions you have with your health care provider. Document Revised: 04/15/2022 Document Reviewed: 07/25/2021 Elsevier Patient Education  [place].

## 2022-11-24 NOTE — Progress Notes (Unsigned)
[facility] clinic  Provider:  Durenda Age  DNP  Code Status:  Full Code  Goals of Care:     08/05/2022   10:22 AM  Advanced Directives  Does Patient Have a Medical Advance Directive? Yes  Type of Paramedic of [place];Living will  Does patient want to make changes to medical advance directive? No - Patient declined  Copy of [place] in Chart? Yes - validated most recent copy scanned in chart (See row information)     Chief Complaint  Patient presents with   Acute Visit    Patient is here for rash all over body    HPI: Patient is a 82 y.o. female seen today for an acute visit for rash all over her body. She has a PMH of aortic stenosis, stage IIIa chronic kidney disease, hyperlipidemia, asymptomatic bilateral carotid artery stenosis, obstructive sleep apnea on CPAP and severe aortic stenosis.  Past Medical History:  Diagnosis Date   Complication of anesthesia    Difficult intubation    20-25 years ago   H/O total knee replacement 10/28/1999   Right   High cholesterol    History of tonsillectomy and adenoidectomy 10/27/1944   History of transcatheter aortic valve replacement (TAVR) 08/06/2021   s/p Edwards 26m S3 vis TF approach with Dr. CBurt Knackand Dr. BCyndia Bent  Psoriatic arthritis ([facility] 10/27/1988   Sleep apnea with use of continuous positive airway pressure (CPAP) 10/27/2002   Thyroid disease     Past Surgical History:  Procedure Laterality Date   CARDIAC CATHETERIZATION     CATARACT EXTRACTION W/ INTRAOCULAR LENS IMPLANT Bilateral    FOOT SURGERY     fused arch in left foot   GROIN DISSECTION  08/06/2021   Procedure: GVirl SonEXPLORATION ;  Surgeon: CWaynetta Sandy MD;  [facility]  Service: Vascular;;   INTRAOPERATIVE TRANSTHORACIC ECHOCARDIOGRAM N/A 08/06/2021   Procedure: INTRAOPERATIVE TRANSTHORACIC ECHOCARDIOGRAM;  Surgeon: TEarly Osmond MD;  [facility]  Service: Cardiovascular;   Laterality: N/A;   LOWER EXTREMITY ANGIOGRAM  08/06/2021   Procedure: LEFT LOWER EXTREMITY ANGIOGRAM;  Surgeon: TEarly Osmond MD;  [facility]  Service: Cardiovascular;;   PATCH ANGIOPLASTY  08/06/2021   Procedure: PATCH ANGIOPLASTY OF FEMORAL ARTERY USING LEFT GREATER SAPHENOUS VEIN;  Surgeon: CWaynetta Sandy MD;  [place] [facility]  Service: Vascular;;   RIGHT/LEFT HEART CATH AND CORONARY ANGIOGRAPHY N/A 04/16/2021   Procedure: RIGHT/LEFT HEART CATH AND CORONARY ANGIOGRAPHY;  Surgeon: GAdrian Prows MD;  [facility];  Service: Cardiovascular;  Laterality: N/A;   SPINE SURGERY     TONSILLECTOMY     TOTAL KNEE ARTHROPLASTY Left    TRANSCATHETER AORTIC VALVE REPLACEMENT, TRANSFEMORAL N/A 08/06/2021   Procedure: TRANSCATHETER AORTIC VALVE REPLACEMENT, TRANSFEMORAL;  Surgeon: TEarly Osmond MD;  [facility];  Service: Cardiovascular;  Laterality: N/A;    Allergies  Allergen Reactions   Codeine Nausea And Vomiting    Weakness and dysequilibrium   Statins Hives    Myalgias     Outpatient Encounter Medications as of 11/24/2022  Medication Sig   aspirin EC 81 MG tablet Take 81 mg by mouth daily. Swallow whole.   Cholecalciferol (VITAMIN D3) 250 MCG (10000 UT) capsule Take 4,000 Units by mouth daily.   clotrimazole-betamethasone (LOTRISONE) cream Apply 1 Application topically daily.   etanercept (ENBREL) 50 MG/ML injection Inject 50 mg into the skin every Sunday.    ezetimibe (ZETIA) 10 MG tablet  TAKE 1 TABLET BY MOUTH DAILY   levothyroxine (SYNTHROID) 75 MCG tablet Take 1 tablet (75 mcg total) by mouth daily before breakfast.   PARoxetine (PAXIL) 10 MG tablet Take 1 tablet (10 mg total) by mouth daily.   [DISCONTINUED] amoxicillin (AMOXIL) 500 MG tablet Take 1 tablet (500 mg total) by mouth as directed. Take 4 tablets by mouth 1 hour prior to dental work and cleanings (Patient not taking: Reported on 11/06/2022)   [DISCONTINUED] HYDROcodone-acetaminophen  (NORCO) 5-325 MG tablet Take 1 tablet by mouth 2 (two) times daily. (Patient not taking: Reported on 11/06/2022)   No facility-administered encounter medications on file as of 11/24/2022.    Review of Systems:  Review of Systems  Constitutional:  Negative for appetite change, chills, fatigue and fever.  HENT:  Negative for congestion, hearing loss, rhinorrhea and sore throat.   Eyes: Negative.   Respiratory:  Negative for cough, shortness of breath and wheezing.   Cardiovascular:  Negative for chest pain, palpitations and leg swelling.  Gastrointestinal:  Negative for abdominal pain, constipation, diarrhea, nausea and vomiting.  Genitourinary:  Negative for dysuria.  Musculoskeletal:  Negative for arthralgias, back pain and myalgias.  Skin:  Negative for color change, rash and wound.  Neurological:  Negative for dizziness, weakness and headaches.  Psychiatric/Behavioral:  Negative for behavioral problems. The patient is not nervous/anxious.     Health Maintenance  Topic Date Due   DTaP/Tdap/Td (2 - Td or Tdap) 10/28/2019   COVID-19 Vaccine (6 - 2023-24 season) 06/27/2022   Medicare Annual Wellness (AWV)  08/06/2023   Pneumonia Vaccine 78+ Years old  Completed   INFLUENZA VACCINE  Completed   DEXA SCAN  Completed   Zoster Vaccines- Shingrix  Completed   HPV VACCINES  Aged Out    Physical Exam: There were no vitals filed for this visit. There is no height or weight on file to calculate BMI. Physical Exam Constitutional:      Appearance: Normal appearance.  HENT:     Head: Normocephalic and atraumatic.     Nose: Nose normal.     Mouth/Throat:     Mouth: Mucous membranes are moist.  Eyes:     Conjunctiva/sclera: Conjunctivae normal.  Cardiovascular:     Rate and Rhythm: Normal rate and regular rhythm.  Pulmonary:     Effort: Pulmonary effort is normal.     Breath sounds: Normal breath sounds.  Abdominal:     General: Bowel sounds are normal.     Palpations: Abdomen is  soft.  Musculoskeletal:        General: Normal range of motion.     Cervical back: Normal range of motion.  Skin:    General: Skin is warm and dry.  Neurological:     General: No focal deficit present.     Mental Status: She is alert and oriented to person, place, and time.  Psychiatric:        Mood and Affect: Mood normal.        Behavior: Behavior normal.        Thought Content: Thought content normal.        Judgment: Judgment normal.     Labs reviewed: Basic Metabolic Panel: Recent Labs    01/17/22 1602 07/22/22 1002 10/06/22 1107 11/18/22 1016  NA 139 140 139 141  K 4.3 4.2 4.3 4.3  CL 106 108 104 107  CO2 '25 27 29 27  '$ GLUCOSE 83 115* 89 84  BUN '12 13 15 19  '$ CREATININE  0.99 1.03* 0.97* 1.10*  CALCIUM 9.7 9.6 9.5 9.6  TSH 0.577  --  0.21*  --    Liver Function Tests: Recent Labs    01/17/22 1602 07/22/22 1002 10/06/22 1107 11/18/22 1016  AST '19 18 20 19  '$ ALT '11 13 16 15  '$ ALKPHOS 64 63  --  69  BILITOT 0.5 0.9 0.7 0.7  PROT 8.1 7.9 7.9 7.4  ALBUMIN 4.3 4.3  --  4.1   No results for input(s): "LIPASE", "AMYLASE" in the last 8760 hours. No results for input(s): "AMMONIA" in the last 8760 hours. CBC: Recent Labs    07/22/22 1002 10/06/22 1107 11/18/22 1016  WBC 6.2 8.0 6.1  NEUTROABS 3.9 5,752 3.7  HGB 14.8 15.2 14.6  HCT 46.6* 48.1* 46.6*  MCV 83.1 84.1 84.4  PLT 195 224 209   Lipid Panel: Recent Labs    10/06/22 1107  CHOL 160  HDL 70  LDLCALC 72  TRIG 96  CHOLHDL 2.3   No results found for: "HGBA1C"  Procedures since last visit: PCV CAROTID DUPLEX (BILATERAL)  Result Date: 11/05/2022 Carotid artery duplex 11/05/2022: Duplex suggests stenosis in the right internal carotid artery (16-49%). Mild homogeneous plaque noted. No evidence of significant stenosis in the left carotid vessels. No hemodynamically significant arterial disease in the left internal carotid artery. Compared to the study done on 04/30/2022, right ICA stenosis of 50 to 69%  and left ICA stenosis of 16 to 49% now appears to have significantly regressed.  There is very minimal homogenous plaque in the carotid arteries. Follow up in one year is appropriate if clinically indicated.    Assessment/Plan  1. Rash in adult -  apply Aquaphor ointment to skin BID - predniSONE (DELTASONE) 20 MG tablet; Take 1 tablet (20 mg total) by mouth daily with breakfast for 3 days.  Dispense: 3 tablet; Refill: 0  2. Acquired hypothyroidism Lab Results  Component Value Date   TSH 0.21 (L) 10/06/2022   - continue Levothyroxine - TSH; Future   Labs/tests ordered:   tsh in a month  Next appt:  12/02/2022

## 2022-12-02 ENCOUNTER — Other Ambulatory Visit: Payer: Medicare Other

## 2022-12-03 ENCOUNTER — Telehealth: Payer: Self-pay | Admitting: *Deleted

## 2022-12-03 MED ORDER — HYDROCODONE-ACETAMINOPHEN 5-325 MG PO TABS: 1.0000 | ORAL_TABLET | Freq: Every day | ORAL | 0 refills | Status: DC

## 2022-12-03 NOTE — Telephone Encounter (Signed)
Patient called requesting a refill on her Hydrocodone 5/325 Medication is NOT in patient's current medication list. Patient still takes and requesting refill.   Please Advise.

## 2022-12-03 NOTE — Telephone Encounter (Signed)
Appears it was discontinued at her last office visit but this is a chronic medication, refill sent to [facility]

## 2022-12-05 ENCOUNTER — Other Ambulatory Visit: Payer: Self-pay | Admitting: Nurse Practitioner

## 2022-12-05 DIAGNOSIS — F419 Anxiety disorder, unspecified: Secondary | ICD-10-CM

## 2022-12-15 ENCOUNTER — Ambulatory Visit: Payer: Medicare Other | Admitting: Nurse Practitioner

## 2022-12-15 ENCOUNTER — Encounter: Payer: Self-pay | Admitting: Nurse Practitioner

## 2022-12-15 ENCOUNTER — Ambulatory Visit (INDEPENDENT_AMBULATORY_CARE_PROVIDER_SITE_OTHER): Payer: Medicare Other | Admitting: Nurse Practitioner

## 2022-12-15 VITALS — BP 118/72 | HR 64 | Temp 97.9°F | Ht 64.0 in | Wt 139.2 lb

## 2022-12-15 DIAGNOSIS — L4052 Psoriatic arthritis mutilans: Secondary | ICD-10-CM | POA: Diagnosis not present

## 2022-12-15 DIAGNOSIS — E039 Hypothyroidism, unspecified: Secondary | ICD-10-CM | POA: Diagnosis not present

## 2022-12-15 DIAGNOSIS — I6523 Occlusion and stenosis of bilateral carotid arteries: Secondary | ICD-10-CM | POA: Diagnosis not present

## 2022-12-15 DIAGNOSIS — F419 Anxiety disorder, unspecified: Secondary | ICD-10-CM | POA: Diagnosis not present

## 2022-12-15 DIAGNOSIS — F321 Major depressive disorder, single episode, moderate: Secondary | ICD-10-CM | POA: Diagnosis not present

## 2022-12-15 MED ORDER — PREDNISONE 10 MG (21) PO TBPK: ORAL_TABLET | ORAL | 0 refills | Status: DC

## 2022-12-15 MED ORDER — PAROXETINE HCL 10 MG PO TABS: 10.0000 mg | ORAL_TABLET | Freq: Every day | ORAL | 1 refills | Status: DC

## 2022-12-15 NOTE — Progress Notes (Signed)
Careteam: Patient Care Team: [name], NP as PCP - General (Geriatric Medicine) Tomma Rakers, MD as Referring Physician (Ophthalmology) Lahoma Rocker, MD as Consulting Physician (Rheumatology)  PLACE OF SERVICE:  [place]  Advanced Directive information    Allergies  Allergen Reactions   Codeine Nausea And Vomiting    Weakness and dysequilibrium   Statins Hives    Myalgias     Chief Complaint  Patient presents with   Medical Management of Chronic Issues    Patient presents today for a 2 month follow-up   Quality Metric Gaps    TDAP,Covid#5     HPI: Patient is a 82 y.o. female here for follow-up.  Her rash improved a lot with the prednisone. However it is still on her back. It is raised, in circular patches. She was applying lortisone cream to the other patches which are now gone, but she has been unable to apply it to her back. The patches on her back are very itchy. They are inflamed, red, raised, and scaly. She sees Dr. Jenetta Downer at [facility] for psoriatic arthritis. She stopped her statin and zetia because she thought they were causing the rash. She hasn't restarted either but she is considering it since it has been 3 weeks and she still has the rash.   Paxil seems to be helping and mood has been good. She is going to a grief support group and she likes that.   She is taking 75 mcg of the levothyroxine. Just started 3 weeks ago.  Denies headaches. She has chronic dizziness and she has seen PT for this. She has tingling in the R hand but she isn't concerned about it; she tripped over a cord for a hospital bed that her husband was using in the house last year and it's been tingling since then. She doesn't want to do imaging or therapy or anything to manage that today. Denies swelling in hands or feet. Denies palpitations. No blood in her urine or stool and no issues with urination, constipation or diarrhea.   She is trying to get her eye care  changed from [facility] to somewhere local so that she can be followed more closely. She regularly follows with ophthalmology and retinology at [facility].    Review of Systems:  Review of Systems  Constitutional:  Negative for chills, fever, malaise/fatigue and weight loss.  HENT:  Negative for congestion and sore throat.   Eyes:  Negative for blurred vision.  Respiratory:  Negative for cough, shortness of breath and wheezing.   Cardiovascular:  Negative for chest pain, palpitations and leg swelling.  Gastrointestinal:  Negative for abdominal pain, blood in stool, constipation, diarrhea, heartburn, nausea and vomiting.  Genitourinary:  Negative for dysuria, frequency, hematuria and urgency.  Musculoskeletal:  Positive for joint pain. Negative for falls.       Chronic joint pain, hx of psoriatic arthritis  Skin:  Negative for rash.  Neurological:  Negative for dizziness, tingling and headaches.  Endo/Heme/Allergies:  Negative for polydipsia.  Psychiatric/Behavioral:  Negative for depression. The patient is not nervous/anxious.     Past Medical History:  Diagnosis Date   Complication of anesthesia    Difficult intubation    20-25 years ago   H/O total knee replacement 10/28/1999   Right   High cholesterol    History of tonsillectomy and adenoidectomy 10/27/1944   History of transcatheter aortic valve replacement (TAVR) 08/06/2021   s/p Edwards 53m S3 vis TF approach with Dr.  Cooper and Dr. Cyndia Bent   Psoriatic arthritis [facility]) 10/27/1988   Sleep apnea with use of continuous positive airway pressure (CPAP) 10/27/2002   Thyroid disease    Past Surgical History:  Procedure Laterality Date   CARDIAC CATHETERIZATION     CATARACT EXTRACTION W/ INTRAOCULAR LENS IMPLANT Bilateral    FOOT SURGERY     fused arch in left foot   GROIN DISSECTION  08/06/2021   Procedure: GROIN EXPLORATION ;  Surgeon: Waynetta Sandy, MD;  Location: [place];  Service: Vascular;;   INTRAOPERATIVE TRANSTHORACIC  ECHOCARDIOGRAM N/A 08/06/2021   Procedure: INTRAOPERATIVE TRANSTHORACIC ECHOCARDIOGRAM;  Surgeon: Early Osmond, MD;  Location: [place];  Service: Cardiovascular;  Laterality: N/A;   LOWER EXTREMITY ANGIOGRAM  08/06/2021   Procedure: LEFT LOWER EXTREMITY ANGIOGRAM;  Surgeon: Early Osmond, MD;  Location: [place];  Service: Cardiovascular;;   PATCH ANGIOPLASTY  08/06/2021   Procedure: PATCH ANGIOPLASTY OF FEMORAL ARTERY USING LEFT GREATER SAPHENOUS VEIN;  Surgeon: Waynetta Sandy, MD;  Location: [place];  Service: Vascular;;   RIGHT/LEFT HEART CATH AND CORONARY ANGIOGRAPHY N/A 04/16/2021   Procedure: RIGHT/LEFT HEART CATH AND CORONARY ANGIOGRAPHY;  Surgeon: Adrian Prows, MD;  Location: [place] CV LAB;  Service: Cardiovascular;  Laterality: N/A;   SPINE SURGERY     TONSILLECTOMY     TOTAL KNEE ARTHROPLASTY Left    TRANSCATHETER AORTIC VALVE REPLACEMENT, TRANSFEMORAL N/A 08/06/2021   Procedure: TRANSCATHETER AORTIC VALVE REPLACEMENT, TRANSFEMORAL;  Surgeon: Early Osmond, MD;  Location: [place] OR;  Service: Cardiovascular;  Laterality: N/A;   Social History:   reports that she quit smoking about 49 years ago. Her smoking use included cigarettes. She has a 36.00 pack-year smoking history. She has never used smokeless tobacco. She reports that she does not currently use alcohol. She reports that she does not use drugs.  Family History  Problem Relation Age of Onset   Heart attack Father    Heart disease Sister     Medications: Patient's Medications  New Prescriptions   PREDNISONE (STERAPRED UNI-PAK 21 TAB) 10 MG (21) TBPK TABLET    Use as directed  Previous Medications   ASPIRIN EC 81 MG TABLET    Take 81 mg by mouth daily. Swallow whole.   CHOLECALCIFEROL (VITAMIN D3) 250 MCG (10000 UT) CAPSULE    Take 4,000 Units by mouth daily.   CLOTRIMAZOLE-BETAMETHASONE (LOTRISONE) CREAM    Apply 1 Application topically daily.   ETANERCEPT (ENBREL) 50 MG/ML INJECTION    Inject 50 mg into  the skin every Sunday.    EZETIMIBE (ZETIA) 10 MG TABLET    TAKE 1 TABLET BY MOUTH DAILY   HYDROCODONE-ACETAMINOPHEN (NORCO/VICODIN) 5-325 MG TABLET    Take 1 tablet by mouth daily.   LEVOTHYROXINE (SYNTHROID) 75 MCG TABLET    Take 1 tablet (75 mcg total) by mouth daily before breakfast.  Modified Medications   Modified Medication Previous Medication   PAROXETINE (PAXIL) 10 MG TABLET PARoxetine (PAXIL) 10 MG tablet      Take 1 tablet (10 mg total) by mouth daily.    TAKE 1 TABLET BY MOUTH EVERY DAY  Discontinued Medications   No medications on file    Physical Exam:  Vitals:   12/15/22 1044  BP: 118/72  Pulse: 64  Temp: 97.9 F (36.6 C)  SpO2: 99%  Weight: 139 lb 3.2 oz (63.1 kg)  Height: 5' 4"$  (1.626 m)   Body mass index is 23.89 kg/m. Wt Readings from Last 3 Encounters:  12/15/22 139 lb 3.2 oz (63.1 kg)  11/24/22 138 lb (62.6 kg)  11/18/22 140 lb (63.5 kg)    Physical Exam Vitals reviewed.  Constitutional:      General: She is not in acute distress.    Appearance: Normal appearance.  Cardiovascular:     Rate and Rhythm: Normal rate and regular rhythm.  Pulmonary:     Effort: No respiratory distress.     Breath sounds: Normal breath sounds.  Abdominal:     General: Bowel sounds are normal. There is no distension.     Palpations: Abdomen is soft. There is no mass.     Tenderness: There is no abdominal tenderness. There is no guarding.  Musculoskeletal:        General: Deformity present.     Cervical back: Neck supple.     Comments: Arthritic changes to BIL upper extremities  Lymphadenopathy:     Cervical: No cervical adenopathy.  Skin:    General: Skin is warm and dry.  Neurological:     Mental Status: She is alert and oriented to person, place, and time.     Gait: Gait abnormal.     Comments: Hx of psoriatic arthritis, back and neck fusion  Psychiatric:        Mood and Affect: Mood normal.     Labs reviewed: Basic Metabolic Panel: Recent Labs     01/17/22 1602 07/22/22 1002 10/06/22 1107 11/18/22 1016  NA 139 140 139 141  K 4.3 4.2 4.3 4.3  CL 106 108 104 107  CO2 25 27 29 27  $ GLUCOSE 83 115* 89 84  BUN 12 13 15 19  $ CREATININE 0.99 1.03* 0.97* 1.10*  CALCIUM 9.7 9.6 9.5 9.6  TSH 0.577  --  0.21*  --    Liver Function Tests: Recent Labs    01/17/22 1602 07/22/22 1002 10/06/22 1107 11/18/22 1016  AST 19 18 20 19  $ ALT 11 13 16 15  $ ALKPHOS 64 63  --  69  BILITOT 0.5 0.9 0.7 0.7  PROT 8.1 7.9 7.9 7.4  ALBUMIN 4.3 4.3  --  4.1   No results for input(s): "LIPASE", "AMYLASE" in the last 8760 hours. No results for input(s): "AMMONIA" in the last 8760 hours. CBC: Recent Labs    07/22/22 1002 10/06/22 1107 11/18/22 1016  WBC 6.2 8.0 6.1  NEUTROABS 3.9 5,752 3.7  HGB 14.8 15.2 14.6  HCT 46.6* 48.1* 46.6*  MCV 83.1 84.1 84.4  PLT 195 224 209   Lipid Panel: Recent Labs    10/06/22 1107  CHOL 160  HDL 70  LDLCALC 72  TRIG 96  CHOLHDL 2.3   TSH: Recent Labs    01/17/22 1602 10/06/22 1107  TSH 0.577 0.21*   A1C: No results found for: "HGBA1C"   Assessment/Plan 1. Current moderate episode of major depressive disorder without prior episode [facility]) Doing better. Continue support group and paxil.  2. Acquired hypothyroidism Patient has been taking medication for 3 weeks. Back for recheck in 6 weeks.  3. Psoriatic arthritis, 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[place]) Continues with rheumatology for psoriatic arthritis. She is unable to treat the patches on her back with lotrisone cream as she did with the other patches which are now gone. Prednisone seemed to help but did not resolve, will do another taper and she will follow up if no improvement. . - predniSONE (STERAPRED UNI-PAK 21 TAB) 10 MG (21) TBPK tablet; Use as directed  Dispense: 21 tablet; Refill: 0  4. Anxiety Patient  is managing better with medication and support group. - PARoxetine (PAXIL) 10 MG tablet; Take 1 tablet (10 mg total) by mouth daily.  Dispense: 90  tablet; Refill: 1   Follow up lab in 6 weeks.  01/27/2023 for routine follow up [place] ACPCNP-S  I personally was present during the history, physical exam and medical decision-making activities of this service and have verified that the service and findings are accurately documented in the student's note Colandra Ohanian K. [place], [facility] [phone]

## 2022-12-15 NOTE — Patient Instructions (Addendum)
Restart zetia  Start simvastatin half tablet for 2 weeks then increase to whole tablet.   Start prednisone dose pack for rash    Schedule 6 week LAB appt for TSH

## 2022-12-22 ENCOUNTER — Ambulatory Visit: Payer: Medicare Other | Admitting: Nurse Practitioner

## 2022-12-22 ENCOUNTER — Other Ambulatory Visit: Payer: Medicare Other

## 2023-01-01 ENCOUNTER — Other Ambulatory Visit: Payer: Self-pay

## 2023-01-01 MED ORDER — HYDROCODONE-ACETAMINOPHEN 5-325 MG PO TABS: 1.0000 | ORAL_TABLET | Freq: Every day | ORAL | 0 refills | Status: DC

## 2023-01-01 NOTE — Telephone Encounter (Signed)
Patient called requesting refill on hydrocodone 5/'325mg'$ . Medication last filled 12/03/2022. There is no current signed treatment agreement on file. There is a treatment agreement from 10/06/2022, under letters tab,but it is not signed.  Medication pended and sent to Sherrie Mustache, NP

## 2023-01-01 NOTE — Telephone Encounter (Signed)
What pharmacy does she want this to be sent to? Please clarify, and please send her the treatment agreement in mail- she can sign and return to [place].  Thank you

## 2023-01-01 NOTE — Telephone Encounter (Signed)
I called patient to clarify which pharmacy to send medication to,but no answer. I left message for patient to call office. Treatment agreement has been placed in outgoing mail.

## 2023-01-01 NOTE — Telephone Encounter (Signed)
Patient uses [facility] [address] Pharmacy updated

## 2023-01-02 DIAGNOSIS — L603 Nail dystrophy: Secondary | ICD-10-CM | POA: Diagnosis not present

## 2023-01-02 DIAGNOSIS — L4052 Psoriatic arthritis mutilans: Secondary | ICD-10-CM | POA: Diagnosis not present

## 2023-01-02 DIAGNOSIS — I739 Peripheral vascular disease, unspecified: Secondary | ICD-10-CM | POA: Diagnosis not present

## 2023-01-02 DIAGNOSIS — L84 Corns and callosities: Secondary | ICD-10-CM | POA: Diagnosis not present

## 2023-01-02 DIAGNOSIS — B351 Tinea unguium: Secondary | ICD-10-CM | POA: Diagnosis not present

## 2023-01-02 DIAGNOSIS — M792 Neuralgia and neuritis, unspecified: Secondary | ICD-10-CM | POA: Diagnosis not present

## 2023-01-08 DIAGNOSIS — Z1231 Encounter for screening mammogram for malignant neoplasm of breast: Secondary | ICD-10-CM | POA: Diagnosis not present

## 2023-01-08 LAB — HM MAMMOGRAPHY

## 2023-01-14 ENCOUNTER — Telehealth: Payer: Self-pay

## 2023-01-14 NOTE — Telephone Encounter (Signed)
Patient called requesting handicap placard. Form filled out and placed on desk of Janett Billow Eubanks,NP to complete and sign.

## 2023-01-27 ENCOUNTER — Other Ambulatory Visit: Payer: Self-pay

## 2023-01-27 ENCOUNTER — Other Ambulatory Visit: Payer: Medicare Other

## 2023-01-27 DIAGNOSIS — E039 Hypothyroidism, unspecified: Secondary | ICD-10-CM

## 2023-01-28 ENCOUNTER — Other Ambulatory Visit: Payer: Self-pay

## 2023-01-28 LAB — TSH: TSH: 1.23 mIU/L (ref 0.40–4.50)

## 2023-01-28 MED ORDER — LEVOTHYROXINE SODIUM 75 MCG PO TABS: 75.0000 ug | ORAL_TABLET | Freq: Every day | ORAL | 0 refills | Status: DC

## 2023-01-29 ENCOUNTER — Other Ambulatory Visit: Payer: Self-pay

## 2023-01-29 MED ORDER — LEVOTHYROXINE SODIUM 75 MCG PO TABS: 75.0000 ug | ORAL_TABLET | Freq: Every day | ORAL | 0 refills | Status: DC

## 2023-02-04 ENCOUNTER — Other Ambulatory Visit: Payer: Self-pay

## 2023-02-04 MED ORDER — HYDROCODONE-ACETAMINOPHEN 5-325 MG PO TABS: 1.0000 | ORAL_TABLET | Freq: Every day | ORAL | 0 refills | Status: DC

## 2023-02-04 NOTE — Telephone Encounter (Signed)
Patient is requesting a refill of the following medications: Requested Prescriptions   Pending Prescriptions Disp Refills   HYDROcodone-acetaminophen (NORCO/VICODIN) 5-325 MG tablet 30 tablet 0    Sig: Take 1 tablet by mouth daily.    Date of last refill: 01/01/2023  Refill amount: 30  Treatment agreement date: 10/06/22

## 2023-02-11 DIAGNOSIS — L400 Psoriasis vulgaris: Secondary | ICD-10-CM | POA: Diagnosis not present

## 2023-02-11 DIAGNOSIS — L814 Other melanin hyperpigmentation: Secondary | ICD-10-CM | POA: Diagnosis not present

## 2023-02-16 ENCOUNTER — Other Ambulatory Visit: Payer: Self-pay

## 2023-02-16 MED ORDER — LEVOTHYROXINE SODIUM 75 MCG PO TABS: 75.0000 ug | ORAL_TABLET | Freq: Every day | ORAL | 1 refills | Status: DC

## 2023-02-17 ENCOUNTER — Inpatient Hospital Stay: Payer: Medicare Other

## 2023-02-17 ENCOUNTER — Inpatient Hospital Stay: Payer: Medicare Other | Attending: Internal Medicine

## 2023-02-17 ENCOUNTER — Inpatient Hospital Stay (HOSPITAL_BASED_OUTPATIENT_CLINIC_OR_DEPARTMENT_OTHER): Payer: Medicare Other | Admitting: Internal Medicine

## 2023-02-17 VITALS — BP 151/84 | HR 80 | Temp 96.8°F | Resp 14 | Wt 134.8 lb

## 2023-02-17 DIAGNOSIS — D45 Polycythemia vera: Secondary | ICD-10-CM | POA: Insufficient documentation

## 2023-02-17 DIAGNOSIS — R5383 Other fatigue: Secondary | ICD-10-CM | POA: Insufficient documentation

## 2023-02-17 DIAGNOSIS — E039 Hypothyroidism, unspecified: Secondary | ICD-10-CM | POA: Insufficient documentation

## 2023-02-17 DIAGNOSIS — H01003 Unspecified blepharitis right eye, unspecified eyelid: Secondary | ICD-10-CM | POA: Diagnosis not present

## 2023-02-17 DIAGNOSIS — R42 Dizziness and giddiness: Secondary | ICD-10-CM | POA: Insufficient documentation

## 2023-02-17 DIAGNOSIS — Z79899 Other long term (current) drug therapy: Secondary | ICD-10-CM | POA: Diagnosis not present

## 2023-02-17 DIAGNOSIS — Z79890 Hormone replacement therapy (postmenopausal): Secondary | ICD-10-CM | POA: Diagnosis not present

## 2023-02-17 DIAGNOSIS — Z885 Allergy status to narcotic agent status: Secondary | ICD-10-CM | POA: Insufficient documentation

## 2023-02-17 DIAGNOSIS — H40023 Open angle with borderline findings, high risk, bilateral: Secondary | ICD-10-CM | POA: Diagnosis not present

## 2023-02-17 DIAGNOSIS — Z888 Allergy status to other drugs, medicaments and biological substances status: Secondary | ICD-10-CM | POA: Insufficient documentation

## 2023-02-17 DIAGNOSIS — H18513 Endothelial corneal dystrophy, bilateral: Secondary | ICD-10-CM | POA: Diagnosis not present

## 2023-02-17 DIAGNOSIS — H35373 Puckering of macula, bilateral: Secondary | ICD-10-CM | POA: Diagnosis not present

## 2023-02-17 DIAGNOSIS — L4050 Arthropathic psoriasis, unspecified: Secondary | ICD-10-CM | POA: Diagnosis not present

## 2023-02-17 LAB — CBC WITH DIFFERENTIAL (CANCER CENTER ONLY)
Abs Immature Granulocytes: 0.01 10*3/uL (ref 0.00–0.07)
Basophils Absolute: 0 10*3/uL (ref 0.0–0.1)
Basophils Relative: 0 %
Eosinophils Absolute: 0 10*3/uL (ref 0.0–0.5)
Eosinophils Relative: 0 %
HCT: 46.4 % — ABNORMAL HIGH (ref 36.0–46.0)
Hemoglobin: 14.8 g/dL (ref 12.0–15.0)
Immature Granulocytes: 0 %
Lymphocytes Relative: 35 %
Lymphs Abs: 2.1 10*3/uL (ref 0.7–4.0)
MCH: 27.1 pg (ref 26.0–34.0)
MCHC: 31.9 g/dL (ref 30.0–36.0)
MCV: 84.8 fL (ref 80.0–100.0)
Monocytes Absolute: 0.7 10*3/uL (ref 0.1–1.0)
Monocytes Relative: 11 %
Neutro Abs: 3.3 10*3/uL (ref 1.7–7.7)
Neutrophils Relative %: 54 %
Platelet Count: 222 10*3/uL (ref 150–400)
RBC: 5.47 MIL/uL — ABNORMAL HIGH (ref 3.87–5.11)
RDW: 14.4 % (ref 11.5–15.5)
WBC Count: 6.1 10*3/uL (ref 4.0–10.5)
nRBC: 0 % (ref 0.0–0.2)

## 2023-02-17 LAB — CMP (CANCER CENTER ONLY)
ALT: 13 U/L (ref 0–44)
AST: 22 U/L (ref 15–41)
Albumin: 4.3 g/dL (ref 3.5–5.0)
Alkaline Phosphatase: 68 U/L (ref 38–126)
Anion gap: 7 (ref 5–15)
BUN: 12 mg/dL (ref 8–23)
CO2: 28 mmol/L (ref 22–32)
Calcium: 9.9 mg/dL (ref 8.9–10.3)
Chloride: 105 mmol/L (ref 98–111)
Creatinine: 1.12 mg/dL — ABNORMAL HIGH (ref 0.44–1.00)
GFR, Estimated: 49 mL/min — ABNORMAL LOW (ref >60)
Glucose, Bld: 91 mg/dL (ref 70–99)
Potassium: 4.3 mmol/L (ref 3.5–5.1)
Sodium: 140 mmol/L (ref 135–145)
Total Bilirubin: 0.7 mg/dL (ref 0.3–1.2)
Total Protein: 7.7 g/dL (ref 6.5–8.1)

## 2023-02-17 LAB — LACTATE DEHYDROGENASE: LDH: 206 U/L — ABNORMAL HIGH (ref 98–192)

## 2023-02-17 NOTE — Progress Notes (Signed)
[facility] [phone]   [phone]  OFFICE PROGRESS NOTE  [name], NP [address] [place] [address]  DIAGNOSIS: Polycythemia vera with positive JAK2 mutation diagnosed in April 2022  PRIOR THERAPY: None  CURRENT THERAPY: Phlebotomy on as-needed basis  INTERVAL HISTORY: Sydney Reid 82 y.o. female returns to the neck today for follow-up visit.  The patient is feeling fine today with no concerning complaints except for mild fatigue.  She has no chest pain, shortness of breath, cough or hemoptysis.  She has occasional dizzy spells.  She denied having any nausea, vomiting, diarrhea or constipation.  She has no headache or visual changes.  She is here today for evaluation and repeat blood work.  MEDICAL HISTORY: Past Medical History:  Diagnosis Date   Complication of anesthesia    Difficult intubation    20-25 years ago   H/O total knee replacement 10/28/1999   Right   High cholesterol    History of tonsillectomy and adenoidectomy 10/27/1944   History of transcatheter aortic valve replacement (TAVR) 08/06/2021   s/p Edwards 23mm S3 vis TF approach with Dr. Excell Seltzer and Dr. Laneta Simmers   Psoriatic arthritis [facility]) 10/27/1988   Sleep apnea with use of continuous positive airway pressure (CPAP) 10/27/2002   Thyroid disease     ALLERGIES:  is allergic to codeine and statins.  MEDICATIONS:  Current Outpatient Medications  Medication Sig Dispense Refill   aspirin EC 81 MG tablet Take 81 mg by mouth daily. Swallow whole.     Cholecalciferol (VITAMIN D3) 250 MCG (10000 UT) capsule Take 4,000 Units by mouth daily.     clotrimazole-betamethasone (LOTRISONE) cream Apply 1 Application topically daily. 30 g 2   etanercept (ENBREL) 50 MG/ML injection Inject 50 mg into the skin every Sunday.      ezetimibe (ZETIA) 10 MG tablet TAKE 1 TABLET BY MOUTH DAILY (Patient not taking: Reported on 12/15/2022) 90 tablet 3    HYDROcodone-acetaminophen (NORCO/VICODIN) 5-325 MG tablet Take 1 tablet by mouth daily. 30 tablet 0   levothyroxine (SYNTHROID) 75 MCG tablet Take 1 tablet (75 mcg total) by mouth daily before breakfast. 90 tablet 1   PARoxetine (PAXIL) 10 MG tablet Take 1 tablet (10 mg total) by mouth daily. 90 tablet 1   predniSONE (STERAPRED UNI-PAK 21 TAB) 10 MG (21) TBPK tablet Use as directed 21 tablet 0   No current facility-administered medications for this visit.    SURGICAL HISTORY:  Past Surgical History:  Procedure Laterality Date   CARDIAC CATHETERIZATION     CATARACT EXTRACTION W/ INTRAOCULAR LENS IMPLANT Bilateral    FOOT SURGERY     fused arch in left foot   GROIN DISSECTION  08/06/2021   Procedure: GROIN EXPLORATION ;  Surgeon: Maeola Harman, MD;  Location: [facility];  Service: Vascular;;   INTRAOPERATIVE TRANSTHORACIC ECHOCARDIOGRAM N/A 08/06/2021   Procedure: INTRAOPERATIVE TRANSTHORACIC ECHOCARDIOGRAM;  Surgeon: Orbie Pyo, MD;  Location[facility] OR;  Service: Cardiovascular;  Laterality: N/A;   LOWER EXTREMITY ANGIOGRAM  08/06/2021   Procedure: LEFT LOWER EXTREMITY ANGIOGRAM;  Surgeon: Orbie Pyo, MD;  Location: [facility] OR;  Service: Cardiovascular;;   PATCH ANGIOPLASTY  08/06/2021   Procedure: PATCH ANGIOPLASTY OF FEMORAL ARTERY USING LEFT GREATER SAPHENOUS VEIN;  Surgeon: Maeola Harman, MD;  Location[facility] OR;  Service: Vascular;;   RIGHT/LEFT HEART CATH AND CORONARY ANGIOGRAPHY N/A 04/16/2021   Procedure: RIGHT/LEFT HEART CATH AND CORONARY ANGIOGRAPHY;  Surgeon: Jacinto Halim,  Vonna Kotyk, MD;  Location: [facility] INVASIVE CV LAB;  Service: Cardiovascular;  Laterality: N/A;   SPINE SURGERY     TONSILLECTOMY     TOTAL KNEE ARTHROPLASTY Left    TRANSCATHETER AORTIC VALVE REPLACEMENT, TRANSFEMORAL N/A 08/06/2021   Procedure: TRANSCATHETER AORTIC VALVE REPLACEMENT, TRANSFEMORAL;  Surgeon: Orbie Pyo, MD;  Location: [facility] OR;  Service: Cardiovascular;  Laterality: N/A;    REVIEW OF  SYSTEMS:  A comprehensive review of systems was negative except for: Neurological: positive for dizziness   PHYSICAL EXAMINATION: General appearance: alert, cooperative, and no distress Head: Normocephalic, without obvious abnormality, atraumatic Neck: no adenopathy, no JVD, supple, symmetrical, trachea midline, and thyroid not enlarged, symmetric, no tenderness/mass/nodules Lymph nodes: Cervical, supraclavicular, and axillary nodes normal. Resp: clear to auscultation bilaterally Back: symmetric, no curvature. ROM normal. No CVA tenderness. Cardio: regular rate and rhythm, S1, S2 normal, no murmur, click, rub or gallop GI: soft, non-tender; bowel sounds normal; no masses,  no organomegaly Extremities: extremities normal, atraumatic, no cyanosis or edema  ECOG PERFORMANCE STATUS: 1 - Symptomatic but completely ambulatory  Blood pressure (!) 151/84, pulse 80, temperature (!) 96.8 F (36 C), temperature source Oral, resp. rate 14, weight 134 lb 12.8 oz (61.1 kg), SpO2 98 %.  LABORATORY DATA: Lab Results  Component Value Date   WBC 6.1 02/17/2023   HGB 14.8 02/17/2023   HCT 46.4 (H) 02/17/2023   MCV 84.8 02/17/2023   PLT 222 02/17/2023      Chemistry      Component Value Date/Time   NA 140 02/17/2023 1023   NA 142 06/11/2021 1035   K 4.3 02/17/2023 1023   CL 105 02/17/2023 1023   CO2 28 02/17/2023 1023   BUN 12 02/17/2023 1023   BUN 15 06/11/2021 1035   CREATININE 1.12 (H) 02/17/2023 1023   CREATININE 0.97 (H) 10/06/2022 1107   GLU 93 04/02/2016 0000      Component Value Date/Time   CALCIUM 9.9 02/17/2023 1023   ALKPHOS 68 02/17/2023 1023   AST 22 02/17/2023 1023   ALT 13 02/17/2023 1023   BILITOT 0.7 02/17/2023 1023       RADIOGRAPHIC STUDIES: No results found.   ASSESSMENT AND PLAN: This is a very pleasant 82 years old white female recently diagnosed with polycythemia vera with positive JAK2 mutation. The patient is currently on phlebotomy on as-needed  basis. Repeat CBC today showed hematocrit of 46.4%. I recommended for her to continue on observation with repeat blood work in 3 months. She will continue on aspirin 81 mg p.o. daily. The patient was advised to call immediately if she has any other concerning symptoms in the interval. The patient voices understanding of current disease status and treatment options and is in agreement with the current care plan.  All questions were answered. The patient knows to call the clinic with any problems, questions or concerns. We can certainly see the patient much sooner if necessary.   Disclaimer: This note was dictated with voice recognition software. Similar sounding words can inadvertently be transcribed and may not be corrected upon review.

## 2023-02-25 ENCOUNTER — Encounter: Payer: Self-pay | Admitting: Family

## 2023-02-25 ENCOUNTER — Ambulatory Visit (INDEPENDENT_AMBULATORY_CARE_PROVIDER_SITE_OTHER): Payer: Medicare Other | Admitting: Family

## 2023-02-25 VITALS — BP 118/68 | HR 73 | Temp 97.4°F | Ht 64.0 in | Wt 140.2 lb

## 2023-02-25 DIAGNOSIS — J029 Acute pharyngitis, unspecified: Secondary | ICD-10-CM

## 2023-02-25 MED ORDER — AMOXICILLIN-POT CLAVULANATE 875-125 MG PO TABS
1.0000 | ORAL_TABLET | Freq: Two times a day (BID) | ORAL | 0 refills | Status: AC
Start: 2023-02-25 — End: 2023-03-04

## 2023-02-25 NOTE — Patient Instructions (Signed)
-   Gurgle throat with warm water and salt at least once daily   - take OTC tylenol as needed for pain   - Increase fluid intake/warm tea or soup  - Notify provider if symptoms worsen or fail to improve  

## 2023-02-25 NOTE — Progress Notes (Signed)
Provider: Yaneth Fairbairn FNP-C  [name], NP  Patient Care Team: [name], NP as PCP - General (Geriatric Medicine) Timoteo Ace, MD as Referring Physician (Ophthalmology) Casimer Lanius, MD as Consulting Physician (Rheumatology)  Extended Emergency Contact Information Primary Emergency Contact: Adryana, Mogensen Address: [phone]          [place] [address] [place] Home Phone: [phone] Mobile Phone: [phone] Relation: Spouse  Code Status:  Full Code  Goals of care: Advanced Directive information    08/05/2022   10:22 AM  Advanced Directives  Does Patient Have a Medical Advance Directive? Yes  Type of Estate agent of [place];Living will  Does patient want to make changes to medical advance directive? No - Patient declined  Copy of Healthcare Power of Attorney in Chart? Yes - validated most recent copy scanned in chart (See row information)     Chief Complaint  Patient presents with   Acute Visit    HPI:  Pt is a 82 y.o. female seen today for an acute visit for evaluation of sore throat,ear ache x 1 days.states has trouble swallowing. States felt like cannot swallow and glands felt swollen. Had some ampicillin 3 last night and one this morning.Temp was low 99.0 Has some runny nose. States brother in law had COVID-19 infection 4 weeks he came to visit on Monday.    Past Medical History:  Diagnosis Date   Complication of anesthesia    Difficult intubation    20-25 years ago   H/O total knee replacement 10/28/1999   Right   High cholesterol    History of tonsillectomy and adenoidectomy 10/27/1944   History of transcatheter aortic valve replacement (TAVR) 08/06/2021   s/p Edwards 23mm S3 vis TF approach with Dr. Excell Seltzer and Dr. Laneta Simmers   Psoriatic arthritis [facility]) 10/27/1988   Sleep apnea with use of continuous positive airway pressure (CPAP) 10/27/2002   Thyroid disease    Past Surgical  History:  Procedure Laterality Date   CARDIAC CATHETERIZATION     CATARACT EXTRACTION W/ INTRAOCULAR LENS IMPLANT Bilateral    FOOT SURGERY     fused arch in left foot   GROIN DISSECTION  08/06/2021   Procedure: Drucie Ip EXPLORATION ;  Surgeon: Maeola Harman, MD;  Location: [facility] OR;  Service: Vascular;;   INTRAOPERATIVE TRANSTHORACIC ECHOCARDIOGRAM N/A 08/06/2021   Procedure: INTRAOPERATIVE TRANSTHORACIC ECHOCARDIOGRAM;  Surgeon: Orbie Pyo, MD;  Location: [facility] OR;  Service: Cardiovascular;  Laterality: N/A;   LOWER EXTREMITY ANGIOGRAM  08/06/2021   Procedure: LEFT LOWER EXTREMITY ANGIOGRAM;  Surgeon: Orbie Pyo, MD;  Location: [facility] OR;  Service: Cardiovascular;;   PATCH ANGIOPLASTY  08/06/2021   Procedure: PATCH ANGIOPLASTY OF FEMORAL ARTERY USING LEFT GREATER SAPHENOUS VEIN;  Surgeon: Maeola Harman, MD;  Location: [facility] OR;  Service: Vascular;;   RIGHT/LEFT HEART CATH AND CORONARY ANGIOGRAPHY N/A 04/16/2021   Procedure: RIGHT/LEFT HEART CATH AND CORONARY ANGIOGRAPHY;  Surgeon: Yates Decamp, MD;  Location: [facility] INVASIVE CV LAB;  Service: Cardiovascular;  Laterality: N/A;   SPINE SURGERY     TONSILLECTOMY     TOTAL KNEE ARTHROPLASTY Left    TRANSCATHETER AORTIC VALVE REPLACEMENT, TRANSFEMORAL N/A 08/06/2021   Procedure: TRANSCATHETER AORTIC VALVE REPLACEMENT, TRANSFEMORAL;  Surgeon: Orbie Pyo, MD;  Location: [facility] OR;  Service: Cardiovascular;  Laterality: N/A;    Allergies  Allergen Reactions   Codeine Nausea And Vomiting    Weakness and dysequilibrium   Statins Hives  Myalgias     Outpatient Encounter Medications as of 02/25/2023  Medication Sig   aspirin EC 81 MG tablet Take 81 mg by mouth daily. Swallow whole.   Cholecalciferol (VITAMIN D3) 250 MCG (10000 UT) capsule Take 4,000 Units by mouth daily.   clotrimazole-betamethasone (LOTRISONE) cream Apply 1 Application topically daily.   etanercept (ENBREL) 50 MG/ML injection Inject 50 mg into the skin  every Sunday.    ezetimibe (ZETIA) 10 MG tablet TAKE 1 TABLET BY MOUTH DAILY   fluocinonide ointment (LIDEX) 0.05 % Apply topically daily.   HYDROcodone-acetaminophen (NORCO/VICODIN) 5-325 MG tablet Take 1 tablet by mouth daily.   levothyroxine (SYNTHROID) 75 MCG tablet Take 1 tablet (75 mcg total) by mouth daily before breakfast.   PARoxetine (PAXIL) 10 MG tablet Take 1 tablet (10 mg total) by mouth daily.   [DISCONTINUED] predniSONE (STERAPRED UNI-PAK 21 TAB) 10 MG (21) TBPK tablet Use as directed   No facility-administered encounter medications on file as of 02/25/2023.    Review of Systems  Constitutional:  Negative for appetite change, chills, fatigue, fever and unexpected weight change.  HENT:  Positive for ear pain, rhinorrhea and sore throat. Negative for congestion, ear discharge, facial swelling, hearing loss, nosebleeds, postnasal drip, sinus pressure, sinus pain, sneezing, tinnitus and trouble swallowing.   Eyes:  Negative for pain, discharge, redness, itching and visual disturbance.  Respiratory:  Negative for cough, chest tightness, shortness of breath and wheezing.   Cardiovascular:  Negative for chest pain, palpitations and leg swelling.  Gastrointestinal:  Negative for abdominal distention, abdominal pain, blood in stool, constipation, diarrhea, nausea and vomiting.  Skin:  Negative for color change, pallor and rash.  Neurological:  Negative for dizziness, weakness, light-headedness and headaches.    Immunization History  Administered Date(s) Administered   Fluad Quad(high Dose 65+) 09/10/2020, 08/07/2021, 10/06/2022   Influenza, High Dose Seasonal PF 07/20/2019   Influenza,inj,Quad PF,6+ Mos 07/08/2017   Influenza-Unspecified 09/13/2012, 11/09/2014, 09/26/2018   PFIZER Comirnaty(Gray Top)Covid-19 Tri-Sucrose Vaccine 01/28/2021   PFIZER(Purple Top)SARS-COV-2 Vaccination 11/13/2019, 12/04/2019, 06/21/2020   Pfizer Covid-19 Vaccine Bivalent Booster 75yrs & up 09/26/2021    Pneumococcal Conjugate-13 11/23/2014   Pneumococcal Polysaccharide-23 10/27/2005   Tdap 10/27/2009   Zoster Recombinat (Shingrix) 12/12/2018, 04/04/2019   Pertinent  Health Maintenance Due  Topic Date Due   INFLUENZA VACCINE  05/28/2023   DEXA SCAN  Completed      01/17/2022    2:02 PM 03/17/2022    1:27 PM 08/05/2022   10:21 AM 10/06/2022   10:35 AM 12/15/2022   10:49 AM  Fall Risk  Falls in the past year?  0 1 1 0  Was there an injury with Fall?  0 0 0 0  Fall Risk Category Calculator  0 1 2 0  Fall Risk Category (Retired)  Low Low Moderate   (RETIRED) Patient Fall Risk Level Low fall risk Low fall risk Low fall risk Moderate fall risk   Patient at Risk for Falls Due to  No Fall Risks No Fall Risks History of fall(s) History of fall(s)  Fall risk Follow up  Falls evaluation completed Falls evaluation completed Falls evaluation completed Falls evaluation completed   Functional Status Survey:    Vitals:   02/25/23 1434  BP: 118/68  Pulse: 73  Temp: (!) 97.4 F (36.3 C)  TempSrc: Oral  SpO2: 98%  Weight: 140 lb 3.2 oz (63.6 kg)  Height: 5\' 4"  (1.626 m)   Body mass index is 24.07 kg/m. Physical Exam Vitals reviewed.  Constitutional:      General: She is not in acute distress.    Appearance: Normal appearance. She is normal weight. She is not ill-appearing or diaphoretic.  HENT:     Head: Normocephalic.     Right Ear: Tympanic membrane, ear canal and external ear normal. There is no impacted cerumen.     Left Ear: Tympanic membrane, ear canal and external ear normal. There is no impacted cerumen.     Nose: Nose normal. No congestion or rhinorrhea.     Mouth/Throat:     Mouth: Mucous membranes are moist.     Pharynx: Oropharynx is clear. Posterior oropharyngeal erythema present. No oropharyngeal exudate.  Eyes:     General: No scleral icterus.       Right eye: No discharge.        Left eye: No discharge.     Extraocular Movements: Extraocular movements intact.      Conjunctiva/sclera: Conjunctivae normal.     Pupils: Pupils are equal, round, and reactive to light.  Neck:     Vascular: No carotid bruit.  Cardiovascular:     Rate and Rhythm: Normal rate and regular rhythm.     Pulses: Normal pulses.     Heart sounds: Normal heart sounds. No murmur heard.    No friction rub. No gallop.  Pulmonary:     Effort: Pulmonary effort is normal. No respiratory distress.     Breath sounds: Normal breath sounds. No wheezing, rhonchi or rales.  Chest:     Chest wall: No tenderness.  Abdominal:     General: Bowel sounds are normal. There is no distension.     Palpations: Abdomen is soft. There is no mass.     Tenderness: There is no abdominal tenderness. There is no right CVA tenderness, left CVA tenderness, guarding or rebound.  Musculoskeletal:        General: No swelling or tenderness. Normal range of motion.     Cervical back: Normal range of motion. No rigidity or tenderness.     Right lower leg: No edema.     Left lower leg: No edema.  Lymphadenopathy:     Cervical: No cervical adenopathy.  Skin:    General: Skin is warm and dry.     Coloration: Skin is not pale.     Findings: No erythema or rash.  Neurological:     Mental Status: She is alert and oriented to person, place, and time.     Motor: No weakness.     Gait: Gait normal.  Psychiatric:        Mood and Affect: Mood normal.        Speech: Speech normal.        Behavior: Behavior normal.    Labs reviewed: Recent Labs    10/06/22 1107 11/18/22 1016 02/17/23 1023  NA 139 141 140  K 4.3 4.3 4.3  CL 104 107 105  CO2 29 27 28   GLUCOSE 89 84 91  BUN 15 19 12   CREATININE 0.97* 1.10* 1.12*  CALCIUM 9.5 9.6 9.9   Recent Labs    07/22/22 1002 10/06/22 1107 11/18/22 1016 02/17/23 1023  AST 18 20 19 22   ALT 13 16 15 13   ALKPHOS 63  --  69 68  BILITOT 0.9 0.7 0.7 0.7  PROT 7.9 7.9 7.4 7.7  ALBUMIN 4.3  --  4.1 4.3   Recent Labs    10/06/22 1107 11/18/22 1016 02/17/23 1023   WBC 8.0 6.1 6.1  NEUTROABS 5,752 3.7 3.3  HGB 15.2 14.6 14.8  HCT 48.1* 46.6* 46.4*  MCV 84.1 84.4 84.8  PLT 224 209 222   Lab Results  Component Value Date   TSH 1.23 01/27/2023   No results found for: "HGBA1C" Lab Results  Component Value Date   CHOL 160 10/06/2022   HDL 70 10/06/2022   LDLCALC 72 10/06/2022   TRIG 96 10/06/2022   CHOLHDL 2.3 10/06/2022    Significant Diagnostic Results in last 30 days:  No results found.  Assessment/Plan  Acute pharyngitis, unspecified etiology Afebrile Posterior pharynx erythema  - has take x 2 dose of ampicillin - Start on Augmentin as below side effects discussed.  - Advised to Gurgle throat with warm water and salt at least once daily  - take OTC tylenol as needed for pain  -Increase fluid intake/warm tea or soup  - Notify provider if symptoms worsen or fail to improve - amoxicillin-clavulanate (AUGMENTIN) 875-125 MG tablet; Take 1 tablet by mouth 2 (two) times daily for 7 days.  Dispense: 14 tablet; Refill: 0  Family/ staff Communication: Reviewed plan of care with patient verbalized understanding.  Labs/tests ordered: None   Next Appointment: Return if symptoms worsen or fail to improve.   Caesar Bookman, NP

## 2023-02-27 ENCOUNTER — Telehealth: Payer: Self-pay

## 2023-02-27 MED ORDER — FLUCONAZOLE 150 MG PO TABS: 150.0000 mg | ORAL_TABLET | Freq: Once | ORAL | 0 refills | Status: AC

## 2023-02-27 NOTE — Telephone Encounter (Signed)
Patient called stating that she was given antibiotic on 02/25/23 and now she is requesting diflucan so she will not get a yeast infection from antibiotic.   Message sent to Abbey Chatters, NP

## 2023-02-27 NOTE — Telephone Encounter (Signed)
Rx sent to pharmacy   

## 2023-03-12 ENCOUNTER — Other Ambulatory Visit: Payer: Self-pay | Admitting: *Deleted

## 2023-03-12 MED ORDER — HYDROCODONE-ACETAMINOPHEN 5-325 MG PO TABS: 1.0000 | ORAL_TABLET | Freq: Every day | ORAL | 0 refills | Status: DC

## 2023-03-12 NOTE — Telephone Encounter (Signed)
Patient called and requested refill.  Epic LR: 02/04/2023 Contract Date: 10/06/2022  Pended Rx and sent to Dinah for approval due to Shanda Bumps out  of office.

## 2023-03-17 DIAGNOSIS — M549 Dorsalgia, unspecified: Secondary | ICD-10-CM | POA: Diagnosis not present

## 2023-03-17 DIAGNOSIS — L409 Psoriasis, unspecified: Secondary | ICD-10-CM | POA: Diagnosis not present

## 2023-03-17 DIAGNOSIS — M1990 Unspecified osteoarthritis, unspecified site: Secondary | ICD-10-CM | POA: Diagnosis not present

## 2023-03-17 DIAGNOSIS — Z79899 Other long term (current) drug therapy: Secondary | ICD-10-CM | POA: Diagnosis not present

## 2023-03-17 DIAGNOSIS — D751 Secondary polycythemia: Secondary | ICD-10-CM | POA: Diagnosis not present

## 2023-03-17 DIAGNOSIS — M8580 Other specified disorders of bone density and structure, unspecified site: Secondary | ICD-10-CM | POA: Diagnosis not present

## 2023-03-17 DIAGNOSIS — L4050 Arthropathic psoriasis, unspecified: Secondary | ICD-10-CM | POA: Diagnosis not present

## 2023-03-17 DIAGNOSIS — M2550 Pain in unspecified joint: Secondary | ICD-10-CM | POA: Diagnosis not present

## 2023-04-01 DIAGNOSIS — B351 Tinea unguium: Secondary | ICD-10-CM | POA: Diagnosis not present

## 2023-04-01 DIAGNOSIS — L84 Corns and callosities: Secondary | ICD-10-CM | POA: Diagnosis not present

## 2023-04-01 DIAGNOSIS — I739 Peripheral vascular disease, unspecified: Secondary | ICD-10-CM | POA: Diagnosis not present

## 2023-04-10 ENCOUNTER — Other Ambulatory Visit: Payer: Self-pay

## 2023-04-10 MED ORDER — HYDROCODONE-ACETAMINOPHEN 5-325 MG PO TABS: 1.0000 | ORAL_TABLET | Freq: Every day | ORAL | 0 refills | Status: DC

## 2023-04-10 NOTE — Telephone Encounter (Signed)
Patient called requesting refill on hydrocodone. Medication last refilled 03/12/23. Treatment agreement on file and up to date.  Medication has been pended and sent to Abbey Chatters, NP

## 2023-04-13 ENCOUNTER — Ambulatory Visit (INDEPENDENT_AMBULATORY_CARE_PROVIDER_SITE_OTHER): Payer: Medicare Other | Admitting: Nurse Practitioner

## 2023-04-13 ENCOUNTER — Encounter: Payer: Self-pay | Admitting: Nurse Practitioner

## 2023-04-13 VITALS — BP 120/74 | HR 64 | Temp 97.9°F | Ht 64.0 in | Wt 140.0 lb

## 2023-04-13 DIAGNOSIS — L4052 Psoriatic arthritis mutilans: Secondary | ICD-10-CM | POA: Diagnosis not present

## 2023-04-13 DIAGNOSIS — D45 Polycythemia vera: Secondary | ICD-10-CM | POA: Diagnosis not present

## 2023-04-13 DIAGNOSIS — F321 Major depressive disorder, single episode, moderate: Secondary | ICD-10-CM

## 2023-04-13 DIAGNOSIS — E782 Mixed hyperlipidemia: Secondary | ICD-10-CM

## 2023-04-13 DIAGNOSIS — F4321 Adjustment disorder with depressed mood: Secondary | ICD-10-CM

## 2023-04-13 DIAGNOSIS — E039 Hypothyroidism, unspecified: Secondary | ICD-10-CM | POA: Diagnosis not present

## 2023-04-13 DIAGNOSIS — F419 Anxiety disorder, unspecified: Secondary | ICD-10-CM | POA: Diagnosis not present

## 2023-04-13 MED ORDER — ESCITALOPRAM OXALATE 10 MG PO TABS
ORAL_TABLET | ORAL | 1 refills | Status: DC
Start: 2023-04-13 — End: 2023-05-20

## 2023-04-13 NOTE — Progress Notes (Signed)
Careteam: Patient Care Team: [name], NP as PCP - General (Geriatric Medicine) Timoteo Ace, MD as Referring Physician (Ophthalmology) Casimer Lanius, MD as Consulting Physician (Rheumatology)  PLACE OF SERVICE:  [facility]  Advanced Directive information Does Patient Have a Medical Advance Directive?: Yes, Type of Advance Directive: Healthcare Power of [place];Living will  Allergies  Allergen Reactions   Codeine Nausea And Vomiting    Weakness and dysequilibrium   Statins Hives    Myalgias     Chief Complaint  Patient presents with   Medical Management of Chronic Issues    6 month follow-up. Discuss need for td/tdap and covid boosters or post pone. Patient would like to discuss changing paxil to every there day.      HPI: Patient is a 82 y.o. female for routine follow up.   Does not feel like paxil is helping. Continues to feel sad and anxious.  Since may she feels like her emotions are worse instead of better.  Her sister is on wellbutrin and likes it.   Continues to go to hematology due to polycythemia. Has not needed phlebotomy.   Has trouble sleeping.    Review of Systems:  Review of Systems  Constitutional:  Negative for chills, fever and weight loss.  HENT:  Negative for tinnitus.   Respiratory:  Negative for cough, sputum production and shortness of breath.   Cardiovascular:  Negative for chest pain, palpitations and leg swelling.  Gastrointestinal:  Negative for abdominal pain, constipation, diarrhea and heartburn.  Genitourinary:  Negative for dysuria, frequency and urgency.  Musculoskeletal:  Positive for joint pain and myalgias. Negative for back pain and falls.  Skin: Negative.   Neurological:  Negative for dizziness and headaches.  Psychiatric/Behavioral:  Positive for depression. Negative for memory loss. The patient has insomnia.     Past Medical History:  Diagnosis Date   Complication of anesthesia    Difficult intubation     20-25 years ago   H/O total knee replacement 10/28/1999   Right   High cholesterol    History of tonsillectomy and adenoidectomy 10/27/1944   History of transcatheter aortic valve replacement (TAVR) 08/06/2021   s/p Edwards 23mm S3 vis TF approach with Dr. Excell Seltzer and Dr. Laneta Simmers   Psoriatic arthritis [facility]) 10/27/1988   Sleep apnea with use of continuous positive airway pressure (CPAP) 10/27/2002   Thyroid disease    Past Surgical History:  Procedure Laterality Date   CARDIAC CATHETERIZATION     CATARACT EXTRACTION W/ INTRAOCULAR LENS IMPLANT Bilateral    FOOT SURGERY     fused arch in left foot   GROIN DISSECTION  08/06/2021   Procedure: Drucie Ip EXPLORATION ;  Surgeon: Maeola Harman, MD;  Location: [facility] OR;  Service: Vascular;;   INTRAOPERATIVE TRANSTHORACIC ECHOCARDIOGRAM N/A 08/06/2021   Procedure: INTRAOPERATIVE TRANSTHORACIC ECHOCARDIOGRAM;  Surgeon: Orbie Pyo, MD;  Location: [facility] OR;  Service: Cardiovascular;  Laterality: N/A;   LOWER EXTREMITY ANGIOGRAM  08/06/2021   Procedure: LEFT LOWER EXTREMITY ANGIOGRAM;  Surgeon: Orbie Pyo, MD;  Location: [facility] OR;  Service: Cardiovascular;;   PATCH ANGIOPLASTY  08/06/2021   Procedure: PATCH ANGIOPLASTY OF FEMORAL ARTERY USING LEFT GREATER SAPHENOUS VEIN;  Surgeon: Maeola Harman, MD;  Location: [facility] OR;  Service: Vascular;;   RIGHT/LEFT HEART CATH AND CORONARY ANGIOGRAPHY N/A 04/16/2021   Procedure: RIGHT/LEFT HEART CATH AND CORONARY ANGIOGRAPHY;  Surgeon: Yates Decamp, MD;  Location: [facility] INVASIVE CV LAB;  Service: Cardiovascular;  Laterality: N/A;  SPINE SURGERY     TONSILLECTOMY     TOTAL KNEE ARTHROPLASTY Left    TRANSCATHETER AORTIC VALVE REPLACEMENT, TRANSFEMORAL N/A 08/06/2021   Procedure: TRANSCATHETER AORTIC VALVE REPLACEMENT, TRANSFEMORAL;  Surgeon: Orbie Pyo, MD;  Location: [facility] OR;  Service: Cardiovascular;  Laterality: N/A;   Social History:   reports that she quit smoking about 49 years ago.  Her smoking use included cigarettes. She has a 36.00 pack-year smoking history. She has never used smokeless tobacco. She reports that she does not currently use alcohol. She reports that she does not use drugs.  Family History  Problem Relation Age of Onset   Heart attack Father    Heart disease Sister     Medications: Patient's Medications  New Prescriptions   No medications on file  Previous Medications   ASPIRIN EC 81 MG TABLET    Take 81 mg by mouth daily. Swallow whole.   CHOLECALCIFEROL (VITAMIN D3) 250 MCG (10000 UT) CAPSULE    Take 4,000 Units by mouth daily.   CLOTRIMAZOLE-BETAMETHASONE (LOTRISONE) CREAM    Apply 1 Application topically daily.   ETANERCEPT (ENBREL) 50 MG/ML INJECTION    Inject 50 mg into the skin every Sunday.    EZETIMIBE (ZETIA) 10 MG TABLET    TAKE 1 TABLET BY MOUTH DAILY   FLUOCINONIDE OINTMENT (LIDEX) 0.05 %    Apply topically daily.   HYDROCODONE-ACETAMINOPHEN (NORCO/VICODIN) 5-325 MG TABLET    Take 1 tablet by mouth daily.   LEVOTHYROXINE (SYNTHROID) 75 MCG TABLET    Take 1 tablet (75 mcg total) by mouth daily before breakfast.   PAROXETINE (PAXIL) 10 MG TABLET    Take 1 tablet (10 mg total) by mouth daily.  Modified Medications   No medications on file  Discontinued Medications   No medications on file    Physical Exam:  Vitals:   04/13/23 1006  BP: 120/74  Pulse: 64  Temp: 97.9 F (36.6 C)  TempSrc: Temporal  SpO2: 99%  Weight: 140 lb (63.5 kg)  Height: 5\' 4"  (1.626 m)   Body mass index is 24.03 kg/m. Wt Readings from Last 3 Encounters:  04/13/23 140 lb (63.5 kg)  02/25/23 140 lb 3.2 oz (63.6 kg)  02/17/23 134 lb 12.8 oz (61.1 kg)    Physical Exam Constitutional:      General: She is not in acute distress.    Appearance: She is well-developed. She is not diaphoretic.  HENT:     Head: Normocephalic and atraumatic.     Mouth/Throat:     Pharynx: No oropharyngeal exudate.  Eyes:     Conjunctiva/sclera: Conjunctivae normal.      Pupils: Pupils are equal, round, and reactive to light.  Cardiovascular:     Rate and Rhythm: Normal rate and regular rhythm.     Heart sounds: Normal heart sounds.  Pulmonary:     Effort: Pulmonary effort is normal.     Breath sounds: Normal breath sounds.  Abdominal:     General: Bowel sounds are normal.     Palpations: Abdomen is soft.  Musculoskeletal:     Cervical back: Normal range of motion and neck supple.     Right lower leg: No edema.     Left lower leg: No edema.  Skin:    General: Skin is warm and dry.  Neurological:     Mental Status: She is alert.  Psychiatric:        Mood and Affect: Mood normal.     Labs reviewed:  Basic Metabolic Panel: Recent Labs    10/06/22 1107 11/18/22 1016 01/27/23 0950 02/17/23 1023  NA 139 141  --  140  K 4.3 4.3  --  4.3  CL 104 107  --  105  CO2 29 27  --  28  GLUCOSE 89 84  --  91  BUN 15 19  --  12  CREATININE 0.97* 1.10*  --  1.12*  CALCIUM 9.5 9.6  --  9.9  TSH 0.21*  --  1.23  --    Liver Function Tests: Recent Labs    07/22/22 1002 10/06/22 1107 11/18/22 1016 02/17/23 1023  AST 18 20 19 22   ALT 13 16 15 13   ALKPHOS 63  --  69 68  BILITOT 0.9 0.7 0.7 0.7  PROT 7.9 7.9 7.4 7.7  ALBUMIN 4.3  --  4.1 4.3   No results for input(s): "LIPASE", "AMYLASE" in the last 8760 hours. No results for input(s): "AMMONIA" in the last 8760 hours. CBC: Recent Labs    10/06/22 1107 11/18/22 1016 02/17/23 1023  WBC 8.0 6.1 6.1  NEUTROABS 5,752 3.7 3.3  HGB 15.2 14.6 14.8  HCT 48.1* 46.6* 46.4*  MCV 84.1 84.4 84.8  PLT 224 209 222   Lipid Panel: Recent Labs    10/06/22 1107  CHOL 160  HDL 70  LDLCALC 72  TRIG 96  CHOLHDL 2.3   TSH: Recent Labs    10/06/22 1107 01/27/23 0950  TSH 0.21* 1.23   A1C: No results found for: "HGBA1C"   Assessment/Plan 1. Anxiety Ongoing, paxil not helping, will stop paxil and start lexapro - escitalopram (LEXAPRO) 10 MG tablet; Start 5 mg (half tablet) daily for 2 weeks  then increase to whole tablet- 10 mg daily  Dispense: 30 tablet; Refill: 1  2. Current moderate episode of major depressive disorder without prior episode (HCC) Ongoing, stop paxil and start lexapro - escitalopram (LEXAPRO) 10 MG tablet; Start 5 mg (half tablet) daily for 2 weeks then increase to whole tablet- 10 mg daily  Dispense: 30 tablet; Refill: 1  3. Grief -worse, grief counseling helped in the past, plans to look into local grief counselor.   4. Acquired hypothyroidism TSH at goal. Continues on synthroid   5. Polycythemia vera (HCC) -continues with hematology for follow up  6. Mixed hyperlipidemia -LDL stable on zetia  7. Psoriatic arthritis, 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HCC) -continues with pain, controlled on hydrocodone/apap daily   Return in about 6 months (around 10/13/2023) for routine follow up .  Janene Harvey. Biagio Borg [facility] [phone]

## 2023-04-13 NOTE — Patient Instructions (Signed)
Stop paxil when you get lexapro. And start lexapro in his place.

## 2023-05-12 ENCOUNTER — Other Ambulatory Visit: Payer: Self-pay | Admitting: *Deleted

## 2023-05-12 MED ORDER — HYDROCODONE-ACETAMINOPHEN 5-325 MG PO TABS: 1.0000 | ORAL_TABLET | Freq: Every day | ORAL | 0 refills | Status: DC

## 2023-05-12 NOTE — Telephone Encounter (Signed)
Patient requested refill.  Epic LR: 04/10/2023 Contract Date: 10/06/2022  Pended Rx and sent to Dinah for approval due to Shanda Bumps out of office.

## 2023-05-20 ENCOUNTER — Inpatient Hospital Stay: Payer: Medicare Other

## 2023-05-20 ENCOUNTER — Inpatient Hospital Stay: Payer: Medicare Other | Attending: Internal Medicine

## 2023-05-20 ENCOUNTER — Other Ambulatory Visit: Payer: Self-pay

## 2023-05-20 ENCOUNTER — Inpatient Hospital Stay (HOSPITAL_BASED_OUTPATIENT_CLINIC_OR_DEPARTMENT_OTHER): Payer: Medicare Other | Admitting: Internal Medicine

## 2023-05-20 VITALS — BP 129/78 | HR 69 | Temp 97.8°F | Resp 16 | Ht 64.0 in | Wt 140.9 lb

## 2023-05-20 DIAGNOSIS — Z885 Allergy status to narcotic agent status: Secondary | ICD-10-CM | POA: Insufficient documentation

## 2023-05-20 DIAGNOSIS — Z888 Allergy status to other drugs, medicaments and biological substances status: Secondary | ICD-10-CM | POA: Diagnosis not present

## 2023-05-20 DIAGNOSIS — Z79899 Other long term (current) drug therapy: Secondary | ICD-10-CM | POA: Diagnosis not present

## 2023-05-20 DIAGNOSIS — Z9089 Acquired absence of other organs: Secondary | ICD-10-CM | POA: Insufficient documentation

## 2023-05-20 DIAGNOSIS — E079 Disorder of thyroid, unspecified: Secondary | ICD-10-CM | POA: Insufficient documentation

## 2023-05-20 DIAGNOSIS — R42 Dizziness and giddiness: Secondary | ICD-10-CM | POA: Diagnosis not present

## 2023-05-20 DIAGNOSIS — G4730 Sleep apnea, unspecified: Secondary | ICD-10-CM | POA: Insufficient documentation

## 2023-05-20 DIAGNOSIS — D45 Polycythemia vera: Secondary | ICD-10-CM

## 2023-05-20 LAB — CBC WITH DIFFERENTIAL (CANCER CENTER ONLY)
Abs Immature Granulocytes: 0.01 10*3/uL (ref 0.00–0.07)
Basophils Absolute: 0 10*3/uL (ref 0.0–0.1)
Basophils Relative: 0 %
Eosinophils Absolute: 0 10*3/uL (ref 0.0–0.5)
Eosinophils Relative: 0 %
HCT: 46.8 % — ABNORMAL HIGH (ref 36.0–46.0)
Hemoglobin: 14.9 g/dL (ref 12.0–15.0)
Immature Granulocytes: 0 %
Lymphocytes Relative: 27 %
Lymphs Abs: 1.8 10*3/uL (ref 0.7–4.0)
MCH: 27 pg (ref 26.0–34.0)
MCHC: 31.8 g/dL (ref 30.0–36.0)
MCV: 84.8 fL (ref 80.0–100.0)
Monocytes Absolute: 0.7 10*3/uL (ref 0.1–1.0)
Monocytes Relative: 10 %
Neutro Abs: 4 10*3/uL (ref 1.7–7.7)
Neutrophils Relative %: 63 %
Platelet Count: 211 10*3/uL (ref 150–400)
RBC: 5.52 MIL/uL — ABNORMAL HIGH (ref 3.87–5.11)
RDW: 13.9 % (ref 11.5–15.5)
WBC Count: 6.5 10*3/uL (ref 4.0–10.5)
nRBC: 0 % (ref 0.0–0.2)

## 2023-05-20 NOTE — Progress Notes (Signed)
[facility] Telephone:([phone]) [phone]   Fax:(336) [phone]  OFFICE PROGRESS NOTE  [name], NP [address] [place] [address]  DIAGNOSIS: Polycythemia vera with positive JAK2 mutation diagnosed in April 2022  PRIOR THERAPY: None  CURRENT THERAPY: Phlebotomy on as-needed basis  INTERVAL HISTORY: Sydney Reid 82 y.o. female returns to the clinic today for follow-up visit.  The patient is feeling fine today with no concerning complaints.  She denied having any chest pain, shortness of breath, cough or hemoptysis.  She has occasional dizzy spells.  She has no nausea, vomiting, diarrhea or constipation.  She has no headache or visual changes.  She is here today for evaluation and repeat blood work.  MEDICAL HISTORY: Past Medical History:  Diagnosis Date   Complication of anesthesia    Difficult intubation    20-25 years ago   H/O total knee replacement 10/28/1999   Right   High cholesterol    History of tonsillectomy and adenoidectomy 10/27/1944   History of transcatheter aortic valve replacement (TAVR) 08/06/2021   s/p Edwards 23mm S3 vis TF approach with Dr. Excell Seltzer and Dr. Laneta Simmers   Psoriatic arthritis [facility]) 10/27/1988   Sleep apnea with use of continuous positive airway pressure (CPAP) 10/27/2002   Thyroid disease     ALLERGIES:  is allergic to codeine and statins.  MEDICATIONS:  Current Outpatient Medications  Medication Sig Dispense Refill   aspirin EC 81 MG tablet Take 81 mg by mouth daily. Swallow whole.     Cholecalciferol (VITAMIN D3) 250 MCG (10000 UT) capsule Take 4,000 Units by mouth daily.     clotrimazole-betamethasone (LOTRISONE) cream Apply 1 Application topically daily. 30 g 2   escitalopram (LEXAPRO) 10 MG tablet Start 5 mg (half tablet) daily for 2 weeks then increase to whole tablet- 10 mg daily 30 tablet 1   etanercept (ENBREL) 50 MG/ML injection Inject 50 mg into the skin every Sunday.      ezetimibe (ZETIA) 10 MG  tablet TAKE 1 TABLET BY MOUTH DAILY 90 tablet 3   fluocinonide ointment (LIDEX) 0.05 % Apply topically daily.     HYDROcodone-acetaminophen (NORCO/VICODIN) 5-325 MG tablet Take 1 tablet by mouth daily. 30 tablet 0   levothyroxine (SYNTHROID) 75 MCG tablet Take 1 tablet (75 mcg total) by mouth daily before breakfast. 90 tablet 1   No current facility-administered medications for this visit.    SURGICAL HISTORY:  Past Surgical History:  Procedure Laterality Date   CARDIAC CATHETERIZATION     CATARACT EXTRACTION W/ INTRAOCULAR LENS IMPLANT Bilateral    FOOT SURGERY     fused arch in left foot   GROIN DISSECTION  08/06/2021   Procedure: GROIN EXPLORATION ;  Surgeon: Maeola Harman, MD;  Location: [facility] OR;  Service: Vascular;;   INTRAOPERATIVE TRANSTHORACIC ECHOCARDIOGRAM N/A 08/06/2021   Procedure: INTRAOPERATIVE TRANSTHORACIC ECHOCARDIOGRAM;  Surgeon: Orbie Pyo, MD;  Location[facility] OR;  Service: Cardiovascular;  Laterality: N/A;   LOWER EXTREMITY ANGIOGRAM  08/06/2021   Procedure: LEFT LOWER EXTREMITY ANGIOGRAM;  Surgeon: Orbie Pyo, MD;  Location: [facility] OR;  Service: Cardiovascular;;   PATCH ANGIOPLASTY  08/06/2021   Procedure: PATCH ANGIOPLASTY OF FEMORAL ARTERY USING LEFT GREATER SAPHENOUS VEIN;  Surgeon: Maeola Harman, MD;  Location[facility] OR;  Service: Vascular;;   RIGHT/LEFT HEART CATH AND CORONARY ANGIOGRAPHY N/A 04/16/2021   Procedure: RIGHT/LEFT HEART CATH AND CORONARY ANGIOGRAPHY;  Surgeon: Yates Decamp, MD;  Location: [facility] INVASIVE CV LAB;  Service: Cardiovascular;  Laterality: N/A;   SPINE SURGERY     TONSILLECTOMY     TOTAL KNEE ARTHROPLASTY Left    TRANSCATHETER AORTIC VALVE REPLACEMENT, TRANSFEMORAL N/A 08/06/2021   Procedure: TRANSCATHETER AORTIC VALVE REPLACEMENT, TRANSFEMORAL;  Surgeon: Orbie Pyo, MD;  Location: [facility] OR;  Service: Cardiovascular;  Laterality: N/A;    REVIEW OF SYSTEMS:  A comprehensive review of systems was negative except  for: Neurological: positive for dizziness   PHYSICAL EXAMINATION: General appearance: alert, cooperative, and no distress Head: Normocephalic, without obvious abnormality, atraumatic Neck: no adenopathy, no JVD, supple, symmetrical, trachea midline, and thyroid not enlarged, symmetric, no tenderness/mass/nodules Lymph nodes: Cervical, supraclavicular, and axillary nodes normal. Resp: clear to auscultation bilaterally Back: symmetric, no curvature. ROM normal. No CVA tenderness. Cardio: regular rate and rhythm, S1, S2 normal, no murmur, click, rub or gallop GI: soft, non-tender; bowel sounds normal; no masses,  no organomegaly Extremities: extremities normal, atraumatic, no cyanosis or edema  ECOG PERFORMANCE STATUS: 1 - Symptomatic but completely ambulatory  Blood pressure 129/78, pulse 69, temperature 97.8 F (36.6 C), temperature source Oral, resp. rate 16, height 5\' 4"  (1.626 m), weight 140 lb 14.4 oz (63.9 kg), SpO2 100%.  LABORATORY DATA: Lab Results  Component Value Date   WBC 6.5 05/20/2023   HGB 14.9 05/20/2023   HCT 46.8 (H) 05/20/2023   MCV 84.8 05/20/2023   PLT 211 05/20/2023      Chemistry      Component Value Date/Time   NA 140 02/17/2023 1023   NA 142 06/11/2021 1035   K 4.3 02/17/2023 1023   CL 105 02/17/2023 1023   CO2 28 02/17/2023 1023   BUN 12 02/17/2023 1023   BUN 15 06/11/2021 1035   CREATININE 1.12 (H) 02/17/2023 1023   CREATININE 0.97 (H) 10/06/2022 1107   GLU 93 04/02/2016 0000      Component Value Date/Time   CALCIUM 9.9 02/17/2023 1023   ALKPHOS 68 02/17/2023 1023   AST 22 02/17/2023 1023   ALT 13 02/17/2023 1023   BILITOT 0.7 02/17/2023 1023       RADIOGRAPHIC STUDIES: No results found.   ASSESSMENT AND PLAN: This is a very pleasant 82 years old white female recently diagnosed with polycythemia vera with positive JAK2 mutation. The patient is currently on observation and phlebotomy on as-needed basis. She had repeat CBC performed  earlier today that showed hematocrit of 46.8. I recommended for her to continue on observation with repeat blood work as well as phlebotomy in 3 months if needed. She was advised to call immediately if she has any other concerning symptoms in the interval. The patient voices understanding of current disease status and treatment options and is in agreement with the current care plan. All questions were answered. The patient knows to call the clinic with any problems, questions or concerns. We can certainly see the patient much sooner if necessary.   Disclaimer: This note was dictated with voice recognition software. Similar sounding words can inadvertently be transcribed and may not be corrected upon review.

## 2023-05-26 ENCOUNTER — Telehealth: Payer: Medicare Other

## 2023-05-26 MED ORDER — ESCITALOPRAM OXALATE 10 MG PO TABS: 10.0000 mg | ORAL_TABLET | ORAL | 1 refills | Status: DC

## 2023-05-26 NOTE — Telephone Encounter (Signed)
RX sent to [facility] and detailed message left on voicemail informing patient

## 2023-05-26 NOTE — Telephone Encounter (Signed)
Patient states [name], NP was to change her medication at last OV and she never received a rx. Patient states she needs alternative to paxil sent to [facility].  It appears patient was changed to lexapro and the medication was removed by the oncologist on 05/20/23 as patient had mentioned she was not taking that medication, due to never receiving.   Please confirm plan to change to lexapro is still valid and I will add back to the list and send 90 day supply to [facility]. I encouraged patient to let us know sooner if a medication concern appears following a visit.

## 2023-05-26 NOTE — Telephone Encounter (Signed)
Yes plan was  escitalopram (LEXAPRO) 10 MG tablet; Start 5 mg (half tablet) daily for 2 weeks then increase to whole tablet- 10 mg daily  Dispense: 30 tablet; Refill: 1

## 2023-06-10 ENCOUNTER — Other Ambulatory Visit: Payer: Self-pay

## 2023-06-10 MED ORDER — HYDROCODONE-ACETAMINOPHEN 5-325 MG PO TABS: 1.0000 | ORAL_TABLET | Freq: Every day | ORAL | 0 refills | Status: DC

## 2023-06-10 NOTE — Telephone Encounter (Signed)
Patient called requesting refill on hydrocodone.  Medication last refilled 05/12/2023. Treatment agreement on file and up to date.   Medication pended and sent to Abbey Chatters, NP

## 2023-06-17 ENCOUNTER — Other Ambulatory Visit: Payer: Self-pay

## 2023-06-17 NOTE — Telephone Encounter (Signed)
Patient called and she stated that she would like prescription sent to [facility] on [address],but if that is on back order as well she states that [facility]mg  that she is able to get.   Medication pneded for Hydrocodone 5/325mg  and sent to Abbey Chatters, NP

## 2023-06-17 NOTE — Telephone Encounter (Signed)
I tried calling patient on her home phone, no answer and voicemail not set up. I tried calling patient on mobile phone, no answer and voicemail full. Will try again later.

## 2023-06-17 NOTE — Telephone Encounter (Signed)
Please call pt and ask if she would like Rx sent to a different pharmacy? We have done this in the past

## 2023-06-17 NOTE — Telephone Encounter (Signed)
Fax received from [facility] requesting alternative for Hydrocodone 5/325 mg tablet because it is on backorder.  Message sent to Abbey Chatters, NP

## 2023-06-18 MED ORDER — HYDROCODONE-ACETAMINOPHEN 5-325 MG PO TABS: 1.0000 | ORAL_TABLET | Freq: Every day | ORAL | 0 refills | Status: DC

## 2023-06-18 NOTE — Telephone Encounter (Signed)
Rx sent in to [facility], please call [facility] to cancel order. Thank you

## 2023-06-24 ENCOUNTER — Other Ambulatory Visit: Payer: Self-pay | Admitting: Nurse Practitioner

## 2023-07-01 DIAGNOSIS — M792 Neuralgia and neuritis, unspecified: Secondary | ICD-10-CM | POA: Diagnosis not present

## 2023-07-01 DIAGNOSIS — I739 Peripheral vascular disease, unspecified: Secondary | ICD-10-CM | POA: Diagnosis not present

## 2023-07-01 DIAGNOSIS — L603 Nail dystrophy: Secondary | ICD-10-CM | POA: Diagnosis not present

## 2023-07-01 DIAGNOSIS — B351 Tinea unguium: Secondary | ICD-10-CM | POA: Diagnosis not present

## 2023-07-01 DIAGNOSIS — L84 Corns and callosities: Secondary | ICD-10-CM | POA: Diagnosis not present

## 2023-07-01 DIAGNOSIS — L4052 Psoriatic arthritis mutilans: Secondary | ICD-10-CM | POA: Diagnosis not present

## 2023-07-03 DIAGNOSIS — M25561 Pain in right knee: Secondary | ICD-10-CM | POA: Diagnosis not present

## 2023-07-14 DIAGNOSIS — L400 Psoriasis vulgaris: Secondary | ICD-10-CM | POA: Diagnosis not present

## 2023-07-20 ENCOUNTER — Other Ambulatory Visit: Payer: Self-pay | Admitting: *Deleted

## 2023-07-20 MED ORDER — HYDROCODONE-ACETAMINOPHEN 5-325 MG PO TABS: 1.0000 | ORAL_TABLET | Freq: Every day | ORAL | 0 refills | Status: DC

## 2023-07-20 NOTE — Telephone Encounter (Signed)
Patient requested refill.  Epic LR: 06/18/2023 Contract Date: 10/06/2022  Pended Rx and sent to [facility] for approval.

## 2023-07-28 DIAGNOSIS — H401111 Primary open-angle glaucoma, right eye, mild stage: Secondary | ICD-10-CM | POA: Diagnosis not present

## 2023-07-29 ENCOUNTER — Telehealth: Payer: Self-pay | Admitting: Cardiology

## 2023-07-29 NOTE — Telephone Encounter (Signed)
Spoke with pt who wishes to keep appointments on 08/19/23.  Pt advised echo appointment has been moved to 1:05 on that day.  Pt verbalizes understanding and agrees with current plan.

## 2023-07-29 NOTE — Telephone Encounter (Signed)
Spoke with pt who complains of SOB on exertion.  The pt states she does not monitor her BP, HR or O2 at home so she does not have any current readings.  She denies CP, edema, cough, fever or night sweats.  No recent illnesses.  She is taking her medications as prescribed.  Pt is scheduled for an echo and OV with structural provider on 08/19/2023.  Pt states she has not been concerned but a friend has noticed SOB and encouraged pt to call.   Pt advised will forward to Dr Jacinto Halim and Briscoe Burns for review and further recommendation.  Reviewed ED precautions.  Pt verbalizes understanding and thanked Charity fundraiser for the call.

## 2023-07-29 NOTE — Telephone Encounter (Signed)
.   Patient has been having sob x 1 month. Please call patient back after 1:00 pm Thanks

## 2023-08-17 ENCOUNTER — Encounter: Payer: Self-pay | Admitting: Nurse Practitioner

## 2023-08-17 ENCOUNTER — Ambulatory Visit (INDEPENDENT_AMBULATORY_CARE_PROVIDER_SITE_OTHER): Payer: Medicare Other | Admitting: Nurse Practitioner

## 2023-08-17 VITALS — BP 114/70 | HR 76 | Temp 97.1°F | Ht 64.0 in | Wt 141.8 lb

## 2023-08-17 DIAGNOSIS — Z0000 Encounter for general adult medical examination without abnormal findings: Secondary | ICD-10-CM

## 2023-08-17 DIAGNOSIS — E2839 Other primary ovarian failure: Secondary | ICD-10-CM

## 2023-08-17 MED ORDER — HYDROCODONE-ACETAMINOPHEN 5-325 MG PO TABS: 1.0000 | ORAL_TABLET | Freq: Every day | ORAL | 0 refills | Status: DC

## 2023-08-17 NOTE — Patient Instructions (Addendum)
  Ms. Delich , Thank you for taking time to come for your Medicare Wellness Visit. I appreciate your ongoing commitment to your health goals. Please review the following plan we discussed and let me know if I can assist you in the future.     This is a list of the screening recommended for you and due dates:  Health Maintenance  Topic Date Due   DTaP/Tdap/Td vaccine (2 - Td or Tdap) 10/28/2019   DEXA scan (bone density measurement)  01/01/2023   Flu Shot  05/28/2023   COVID-19 Vaccine (6 - 2023-24 season) 06/28/2023   Medicare Annual Wellness Visit  08/16/2024   Pneumonia Vaccine  Completed   Zoster (Shingles) Vaccine  Completed   HPV Vaccine  Aged Out   To get tdap and covid booster at local pharmacy To get flu shot at local pharmacy  Bone density due- to call [phone]

## 2023-08-17 NOTE — Progress Notes (Signed)
Subjective:   Sydney Reid is a 82 y.o. female who presents for Medicare Annual (Subsequent) preventive examination.  Visit Complete: In person at [facility]   Cardiac Risk Factors include: advanced age (>62men, >49 women)     Objective:    Today's Vitals   08/17/23 1048 08/17/23 1054  BP:  114/70  Pulse:  76  Temp:  (!) 97.1 F (36.2 C)  SpO2:  98%  Weight: 141 lb 12.8 oz (64.3 kg) 141 lb 12.8 oz (64.3 kg)  Height: 5\' 4"  (1.626 m) 5\' 4"  (1.626 m)  PainSc:  3    Body mass index is 24.34 kg/m.     08/17/2023   11:00 AM 04/13/2023    9:09 AM 08/05/2022   10:22 AM 04/07/2022    2:50 PM 03/17/2022    1:29 PM 01/17/2022    2:01 PM 08/06/2021    9:17 AM  Advanced Directives  Does Patient Have a Medical Advance Directive? Yes Yes Yes Yes Yes No;Yes Yes  Type of Estate agent of [place];Living will Healthcare Power of [place];Living will Healthcare Power of [place];Living will  Healthcare Power of [place];Living will  Healthcare Power of [place];Living will  Does patient want to make changes to medical advance directive? No - Patient declined  No - Patient declined No - Patient declined No - Patient declined  No - Patient declined  Copy of Healthcare Power of Attorney in Chart? Yes - validated most recent copy scanned in chart (See row information) Yes - validated most recent copy scanned in chart (See row information) Yes - validated most recent copy scanned in chart (See row information)  No - copy requested  No - copy requested    Current Medications (verified) Outpatient Encounter Medications as of 08/17/2023  Medication Sig   aspirin EC 81 MG tablet Take 81 mg by mouth daily. Swallow whole.   Cholecalciferol (VITAMIN D3) 250 MCG (10000 UT) capsule Take 4,000 Units by mouth daily.   clotrimazole-betamethasone (LOTRISONE) cream Apply 1 Application topically daily. (Patient taking differently: Apply 1 Application topically daily as needed (skin  condition). Per pt)   etanercept (ENBREL) 50 MG/ML injection Inject 50 mg into the skin every Sunday.    ezetimibe (ZETIA) 10 MG tablet TAKE 1 TABLET BY MOUTH DAILY   fluocinonide ointment (LIDEX) 0.05 % Apply 1 Application topically daily as needed.   HYDROcodone-acetaminophen (NORCO/VICODIN) 5-325 MG tablet Take 1 tablet by mouth daily.   levothyroxine (SYNTHROID) 75 MCG tablet TAKE 1 TABLET BY MOUTH DAILY  BEFORE BREAKFAST   [DISCONTINUED] escitalopram (LEXAPRO) 10 MG tablet Take 1 tablet (10 mg total) by mouth as directed. Start 5 mg (half tablet) daily for 2 weeks then increase to whole tablet- 10 mg daily   No facility-administered encounter medications on file as of 08/17/2023.    Allergies (verified) Codeine and Statins   History: Past Medical History:  Diagnosis Date   Complication of anesthesia    Difficult intubation    20-25 years ago   H/O total knee replacement 10/28/1999   Right   High cholesterol    History of tonsillectomy and adenoidectomy 10/27/1944   History of transcatheter aortic valve replacement (TAVR) 08/06/2021   s/p Edwards 23mm S3 vis TF approach with Dr. Excell Seltzer and Dr. Laneta Simmers   Psoriatic arthritis [facility]) 10/27/1988   Sleep apnea with use of continuous positive airway pressure (CPAP) 10/27/2002   Thyroid disease    Past Surgical History:  Procedure Laterality Date   CARDIAC CATHETERIZATION  CATARACT EXTRACTION W/ INTRAOCULAR LENS IMPLANT Bilateral    FOOT SURGERY     fused arch in left foot   GROIN DISSECTION  08/06/2021   Procedure: GROIN EXPLORATION ;  Surgeon: Maeola Harman, MD;  Location: [facility] OR;  Service: Vascular;;   INTRAOPERATIVE TRANSTHORACIC ECHOCARDIOGRAM N/A 08/06/2021   Procedure: INTRAOPERATIVE TRANSTHORACIC ECHOCARDIOGRAM;  Surgeon: Orbie Pyo, MD;  Location: [facility] OR;  Service: Cardiovascular;  Laterality: N/A;   LOWER EXTREMITY ANGIOGRAM  08/06/2021   Procedure: LEFT LOWER EXTREMITY ANGIOGRAM;  Surgeon: Orbie Pyo, MD;  Location: [facility] OR;  Service: Cardiovascular;;   PATCH ANGIOPLASTY  08/06/2021   Procedure: PATCH ANGIOPLASTY OF FEMORAL ARTERY USING LEFT GREATER SAPHENOUS VEIN;  Surgeon: Maeola Harman, MD;  [facility] OR;  Service: Vascular;;   RIGHT/LEFT HEART CATH AND CORONARY ANGIOGRAPHY N/A 04/16/2021   Procedure: RIGHT/LEFT HEART CATH AND CORONARY ANGIOGRAPHY;  Surgeon: Yates Decamp, MD;  Location: [facility] INVASIVE CV LAB;  Service: Cardiovascular;  Laterality: N/A;   SPINE SURGERY     TONSILLECTOMY     TOTAL KNEE ARTHROPLASTY Left    TRANSCATHETER AORTIC VALVE REPLACEMENT, TRANSFEMORAL N/A 08/06/2021   Procedure: TRANSCATHETER AORTIC VALVE REPLACEMENT, TRANSFEMORAL;  Surgeon: Orbie Pyo, MD;  Location: [facility] OR;  Service: Cardiovascular;  Laterality: N/A;   Family History  Problem Relation Age of Onset   Heart attack Father    Heart disease Sister    Social History   Socioeconomic History   Marital status: Widowed    Spouse name: Not on file   Number of children: 0   Years of education: 16   Highest education level: Bachelor's degree (e.g., BA, AB, BS)  Occupational History   Not on file  Tobacco Use   Smoking status: Former    Current packs/day: 0.00    Average packs/day: 2.0 packs/day for 18.0 years (36.0 ttl pk-yrs)    Types: Cigarettes    Start date: 23    Quit date: 62    Years since quitting: 49.8   Smokeless tobacco: Never  Vaping Use   Vaping status: Never Used  Substance and Sexual Activity   Alcohol use: Not Currently    Comment: Glass of wine daily   Drug use: No   Sexual activity: Not on file  Other Topics Concern   Not on file  Social History Narrative   Diet? Normal      Do you drink/eat things with caffeine? Yes, 1 mt. Dew per day      Marital status?              m                      What year were you married? 1988      Do you live in a house, apartment, assisted living, condo, trailer, etc.? Townhouse      Is it one or more  stories? 2 story      How many persons live in your home? 2      Do you have any pets in your home? (please list) no      Current or past profession: Is manager      Do you exercise?     No (did it for 50 years)                       Type & how often?      Do you have a living  will? yes      Do you have a DNR form?        yes                          If not, do you want to discuss one?      Do you have signed POA/HPOA for forms?       Social Determinants of Health   Financial Resource Strain: Low Risk  (04/30/2018)   Overall Financial Resource Strain (CARDIA)    Difficulty of Paying Living Expenses: Not hard at all  Food Insecurity: No Food Insecurity (04/30/2018)   Hunger Vital Sign    Worried About Running Out of Food in the Last Year: Never true    Ran Out of Food in the Last Year: Never true  Transportation Needs: No Transportation Needs (04/30/2018)   PRAPARE - Administrator, Civil Service (Medical): No    Lack of Transportation (Non-Medical): No  Physical Activity: Inactive (04/30/2018)   Exercise Vital Sign    Days of Exercise per Week: 0 days    Minutes of Exercise per Session: 0 min  Stress: No Stress Concern Present (04/30/2018)   [employer] of Occupational Health - Occupational Stress Questionnaire    Feeling of Stress : Not at all  Social Connections: Socially Integrated (04/30/2018)   Social Connection and Isolation Panel [NHANES]    Frequency of Communication with Friends and Family: More than three times a week    Frequency of Social Gatherings with Friends and Family: Three times a week    Attends Religious Services: More than 4 times per year    Active Member of Clubs or Organizations: Yes    Attends Engineer, structural: More than 4 times per year    Marital Status: Married    Tobacco Counseling Counseling given: Not Answered   Clinical Intake:  Pre-visit preparation completed: Yes  Pain : 0-10 Pain Score: 3  Pain Type:  Neuropathic pain Pain Location: Back Pain Descriptors / Indicators: Aching Pain Onset: More than a month ago Pain Frequency: Intermittent Pain Relieving Factors: Norco Effect of Pain on Daily Activities: Walking/ standing for a long time per pt  Pain Relieving Factors: Norco  BMI - recorded: 24.34 Nutritional Status: BMI of 19-24  Normal Nutritional Risks: None Diabetes: No  How often do you need to have someone help you when you read instructions, pamphlets, or other written materials from your doctor or pharmacy?: 1 - Never What is the last grade level you completed in school?: Master's  Interpreter Needed?: No  Information entered by :: Porsha McClurkin,CMA   Activities of Daily Living    08/17/2023   10:57 AM  In your present state of health, do you have any difficulty performing the following activities:  Hearing? 0  Vision? 0  Difficulty concentrating or making decisions? 0  Walking or climbing stairs? 0  Dressing or bathing? 0  Doing errands, shopping? 0  Preparing Food and eating ? N  Using the Toilet? N  In the past six months, have you accidently leaked urine? Y  Do you have problems with loss of bowel control? N  Managing your Medications? N  Managing your Finances? N  Housekeeping or managing your Housekeeping? N    Patient Care Team: [name], NP as PCP - General (Geriatric Medicine) Timoteo Ace, MD as Referring Physician (Ophthalmology) Casimer Lanius, MD as Consulting Physician (Rheumatology)  Indicate  any recent Medical Services you may have received from other than [facility] providers in the past year (date may be approximate).     Assessment:   This is a routine wellness examination for Kash.  Hearing/Vision screen No results found.   Goals Addressed   None    Depression Screen    08/17/2023   11:00 AM 04/13/2023   10:25 AM 08/05/2022   10:21 AM 03/17/2022    1:28 PM 02/28/2022    4:09 PM 01/02/2022    5:25 PM 08/01/2021     2:06 PM  PHQ 2/9 Scores  PHQ - 2 Score 0 0 0 0 0 0 0    Fall Risk    08/17/2023   11:00 AM 12/15/2022   10:49 AM 10/06/2022   10:35 AM 08/05/2022   10:21 AM 03/17/2022    1:27 PM  Fall Risk   Falls in the past year? 0 0 1 1 0  Number falls in past yr: 0 0 1 0 0  Injury with Fall? 0 0 0 0 0  Risk for fall due to : History of fall(s) History of fall(s) History of fall(s) No Fall Risks No Fall Risks  Follow up Falls evaluation completed Falls evaluation completed Falls evaluation completed Falls evaluation completed Falls evaluation completed    MEDICARE RISK AT HOME: Medicare Risk at Home Any stairs in or around the home?: Yes If so, are there any without handrails?: No Home free of loose throw rugs in walkways, pet beds, electrical cords, etc?: No Adequate lighting in your home to reduce risk of falls?: Yes Life alert?: No Use of a cane, walker or w/c?: No Grab bars in the bathroom?: Yes Shower chair or bench in shower?: No Elevated toilet seat or a handicapped toilet?: Yes  TIMED UP AND GO:  Was the test performed?  No    Cognitive Function:    08/17/2023   11:01 AM 05/02/2019    9:20 AM 04/30/2018   11:14 AM 04/09/2017    9:21 AM  MMSE - Mini Mental State Exam  Orientation to time 5 5 5 5   Orientation to Place 5 5 5 5   Registration 3 3 3 3   Attention/ Calculation 5 5 5 5   Recall 3 3 3 3   Language- name 2 objects 2 2 2 2   Language- repeat 1 1 1 1   Language- follow 3 step command 3 3 3 3   Language- read & follow direction 1 1 1 1   Write a sentence 1 1 1 1   Copy design 1 1 1 1   Total score 30 30 30 30         08/05/2022   10:21 AM 08/01/2021    2:10 PM 05/15/2020    8:33 AM  6CIT Screen  What Year? 0 points 0 points 0 points  What month? 0 points 0 points 0 points  What time? 0 points 0 points 0 points  Count back from 20 0 points 0 points 0 points  Months in reverse 0 points 0 points 0 points  Repeat phrase 0 points 0 points 0 points  Total Score 0  points 0 points 0 points    Immunizations Immunization History  Administered Date(s) Administered   Fluad Quad(high Dose 65+) 09/10/2020, 08/07/2021, 10/06/2022   Influenza, High Dose Seasonal PF 07/20/2019   Influenza,inj,Quad PF,6+ Mos 07/08/2017   Influenza-Unspecified 09/13/2012, 11/09/2014, 09/26/2018   PFIZER Comirnaty(Gray Top)Covid-19 Tri-Sucrose Vaccine 01/28/2021   PFIZER(Purple Top)SARS-COV-2 Vaccination 11/13/2019, 12/04/2019, 06/21/2020   Pfizer  Covid-19 Vaccine Bivalent Booster 15yrs & up 09/26/2021   Pneumococcal Conjugate-13 11/23/2014   Pneumococcal Polysaccharide-23 10/27/2005   Tdap 10/27/2009   Zoster Recombinant(Shingrix) 12/12/2018, 04/04/2019    TDAP status: Due, Education has been provided regarding the importance of this vaccine. Advised may receive this vaccine at local pharmacy or Health Dept. Aware to provide a copy of the vaccination record if obtained from local pharmacy or Health Dept. Verbalized acceptance and understanding.  Flu Vaccine status: Due, Education has been provided regarding the importance of this vaccine. Advised may receive this vaccine at local pharmacy or Health Dept. Aware to provide a copy of the vaccination record if obtained from local pharmacy or Health Dept. Verbalized acceptance and understanding.  Pneumococcal vaccine status: Due, Education has been provided regarding the importance of this vaccine. Advised may receive this vaccine at local pharmacy or Health Dept. Aware to provide a copy of the vaccination record if obtained from local pharmacy or Health Dept. Verbalized acceptance and understanding.  Covid-19 vaccine status: Information provided on how to obtain vaccines.   Qualifies for Shingles Vaccine? Yes   Zostavax completed No   Shingrix Completed?: Yes  Screening Tests Health Maintenance  Topic Date Due   DTaP/Tdap/Td (2 - Td or Tdap) 10/28/2019   DEXA SCAN  01/01/2023   INFLUENZA VACCINE  05/28/2023   COVID-19  Vaccine (6 - 2023-24 season) 06/28/2023   Medicare Annual Wellness (AWV)  08/16/2024   Pneumonia Vaccine 36+ Years old  Completed   Zoster Vaccines- Shingrix  Completed   HPV VACCINES  Aged Out    Health Maintenance  Health Maintenance Due  Topic Date Due   DTaP/Tdap/Td (2 - Td or Tdap) 10/28/2019   DEXA SCAN  01/01/2023   INFLUENZA VACCINE  05/28/2023   COVID-19 Vaccine (6 - 2023-24 season) 06/28/2023    Colorectal cancer screening: No longer required.   Mammogram status: No longer required due to age.  Bone Density status: Ordered today. Pt provided with contact info and advised to call to schedule appt.  Lung Cancer Screening: (Low Dose CT Chest recommended if Age 53-80 years, 20 pack-year currently smoking OR have quit w/in 15years.) does not qualify.   Lung Cancer Screening Referral: na  Additional Screening:  Hepatitis C Screening: does qualify; Completed  Vision Screening: Recommended annual ophthalmology exams for early detection of glaucoma and other disorders of the eye. Is the patient up to date with their annual eye exam?  Yes  Who is the provider or what is the name of the office in which the patient attends annual eye exams? [facility] If pt is not established with a provider, would they like to be referred to a provider to establish care? No .   Dental Screening: Recommended annual dental exams for proper oral hygiene    Community Resource Referral / Chronic Care Management: CRR required this visit?  No   CCM required this visit?  No     Plan:     I have personally reviewed and noted the following in the patient's chart:   Medical and social history Use of alcohol, tobacco or illicit drugs  Current medications and supplements including opioid prescriptions. Patient is currently taking opioid prescriptions. Information provided to patient regarding non-opioid alternatives. Patient advised to discuss non-opioid treatment plan with their  provider. Functional ability and status Nutritional status Physical activity Advanced directives List of other physicians Hospitalizations, surgeries, and ER visits in previous 12 months Vitals Screenings to include cognitive, depression, and falls Referrals  and appointments  In addition, I have reviewed and discussed with patient certain preventive protocols, quality metrics, and best practice recommendations. A written personalized care plan for preventive services as well as general preventive health recommendations were provided to patient.     [name], NP   08/17/2023

## 2023-08-19 ENCOUNTER — Other Ambulatory Visit (HOSPITAL_COMMUNITY): Payer: Medicare Other

## 2023-08-19 ENCOUNTER — Ambulatory Visit (HOSPITAL_BASED_OUTPATIENT_CLINIC_OR_DEPARTMENT_OTHER): Payer: Medicare Other

## 2023-08-19 ENCOUNTER — Ambulatory Visit: Payer: Medicare Other | Attending: Physician Assistant | Admitting: Physician Assistant

## 2023-08-19 VITALS — BP 128/76 | Ht 64.0 in | Wt 140.0 lb

## 2023-08-19 DIAGNOSIS — I2510 Atherosclerotic heart disease of native coronary artery without angina pectoris: Secondary | ICD-10-CM | POA: Diagnosis not present

## 2023-08-19 DIAGNOSIS — Z952 Presence of prosthetic heart valve: Secondary | ICD-10-CM

## 2023-08-19 DIAGNOSIS — I6523 Occlusion and stenosis of bilateral carotid arteries: Secondary | ICD-10-CM | POA: Diagnosis not present

## 2023-08-19 DIAGNOSIS — M069 Rheumatoid arthritis, unspecified: Secondary | ICD-10-CM | POA: Insufficient documentation

## 2023-08-19 DIAGNOSIS — I10 Essential (primary) hypertension: Secondary | ICD-10-CM | POA: Diagnosis not present

## 2023-08-19 LAB — ECHOCARDIOGRAM COMPLETE
AR max vel: 1.17 cm2
AV Area VTI: 1.39 cm2
AV Area mean vel: 1.28 cm2
AV Mean grad: 9 mm[Hg]
AV Peak grad: 18.1 mm[Hg]
Ao pk vel: 2.13 m/s
Area-P 1/2: 3.46 cm2
S' Lateral: 2 cm

## 2023-08-19 NOTE — Patient Instructions (Signed)
Medication Instructions:  Your physician recommends that you continue on your current medications as directed. Please refer to the Current Medication list given to you today.  *If you need a refill on your cardiac medications before your next appointment, please call your pharmacy*   Lab Work: None ordered   If you have labs (blood work) drawn today and your tests are completely normal, you will receive your results only by: [place] (if you have MyChart) OR A paper copy in the mail If you have any lab test that is abnormal or we need to change your treatment, we will call you to review the results.   Testing/Procedures: None ordered    Follow-Up: Follow up as scheduled   Other Instructions

## 2023-08-19 NOTE — Progress Notes (Signed)
Echocardiogram 08/19/2023:   1. Left ventricular ejection fraction, by estimation, is 65 to 70%. Left ventricular ejection fraction by PLAX is 69 %. The left ventricle has normal function. The left ventricle has no regional wall motion abnormalities. Left ventricular diastolic parameters are consistent with Grade I diastolic dysfunction (impaired relaxation).  2. Right ventricular systolic function is normal. The right ventricular size is normal. There is normal pulmonary artery systolic pressure. The estimated right ventricular systolic pressure is 16.0 mmHg.  3. The mitral valve is grossly normal. No evidence of mitral valve regurgitation.  4. The aortic valve has been repaired/replaced. Aortic valve regurgitation is not visualized. There is a 23 mm Sapien prosthetic (TAVR) valve present in the aortic position. Aortic valve mean gradient measures 9.0 mmHg. Peak gradient 18.1 mmHg, DI is 0.33.  5. The inferior vena cava is normal in size with greater than 50% respiratory variability, suggesting right atrial pressure of 3 mmHg.  Comparison(s): Changes from prior study are noted. 09/03/2022: LVEF 60-65%, TAVR mean gradient 14 mmHg.

## 2023-08-19 NOTE — Progress Notes (Signed)
[facility]   MULTIDISCIPLINARY HEART VALVE CLINIC                                     Cardiology Office Note:    Date:  08/19/2023   ID:  Sydney Reid, DOB 03/03/1941, MRN [id number]  PCP:  Sydney Seller, NP  [facility] Cardiologist:  Sydney Decamp, MD  [facility] Electrophysiologist:  None   Referring MD: Sydney Seller, NP   Follow up  History of Present Illness:    Sydney Reid is a 82 y.o. female with a hx of psoriatic arthritis, OSA on CPAP, HLD, CAD, polycythemia vera and aortic stenosis s/p TAVR (08/06/21) who presents to clinic for follow up.    Cardiac catheterization 06/21/21 showed moderate stenosis of the left circumflex with 60 to 70% proximal stenosis and an 80% calcific stenosis of a third marginal branch.  The LAD had mild nonobstructive disease.  The RCA had mild calcific plaque but no stenosis.     She underwent successful TAVR with a 23 mm Edwards Sapien 3 Ultra THV via the TF approach on 08/06/21. This c/b by occlusion of the left common femoral artery s/p successful left common femoral endarterectomy with vein patch angioplasty by Dr. Randie Reid. Post operative echo showed EF 60%, normally functioning TAVR with a mean gradient of 12 mmHg and no PVL. She was continued on Asprin with the addition of Plavix 75 mg daily x 3 months.  Today the patient presents to clinic for follow up. She is limited by back back pain and she is unable to exercise like she used to. She stays as active as she can doing house work. She feels most shortness of breath when she bends over. Given issues with hands she has to bend over extremely far. She has had chronic dizziness that she thinks is vestibular. No syncope. Occasionally has some tightness in her chest that is not related to exertion.   Past Medical History:  Diagnosis Date   Complication of anesthesia    Difficult intubation    20-25 years ago   H/O total knee replacement 10/28/1999   Right    High cholesterol    History of tonsillectomy and adenoidectomy 10/27/1944   History of transcatheter aortic valve replacement (TAVR) 08/06/2021   s/p Edwards 23mm S3 vis TF approach with Dr. Excell Seltzer and Dr. Laneta Simmers   Psoriatic arthritis [facility]) 10/27/1988   Sleep apnea with use of continuous positive airway pressure (CPAP) 10/27/2002   Thyroid disease     Past Surgical History:  Procedure Laterality Date   CARDIAC CATHETERIZATION     CATARACT EXTRACTION W/ INTRAOCULAR LENS IMPLANT Bilateral    FOOT SURGERY     fused arch in left foot   GROIN DISSECTION  08/06/2021   Procedure: Drucie Ip EXPLORATION ;  Surgeon: Sydney Harman, MD;  Location: [facility] OR;  Service: Vascular;;   INTRAOPERATIVE TRANSTHORACIC ECHOCARDIOGRAM N/A 08/06/2021   Procedure: INTRAOPERATIVE TRANSTHORACIC ECHOCARDIOGRAM;  Surgeon: Sydney Pyo, MD;  Location: [facility] OR;  Service: Cardiovascular;  Laterality: N/A;   LOWER EXTREMITY ANGIOGRAM  08/06/2021   Procedure: LEFT LOWER EXTREMITY ANGIOGRAM;  Surgeon: Sydney Pyo, MD;  Location: [facility] OR;  Service: Cardiovascular;;   PATCH ANGIOPLASTY  08/06/2021   Procedure: PATCH ANGIOPLASTY OF FEMORAL ARTERY USING LEFT GREATER SAPHENOUS VEIN;  Surgeon: Sydney Harman, MD;  Location: [facility] OR;  Service: Vascular;;  RIGHT/LEFT HEART CATH AND CORONARY ANGIOGRAPHY N/A 04/16/2021   Procedure: RIGHT/LEFT HEART CATH AND CORONARY ANGIOGRAPHY;  Surgeon: Sydney Decamp, MD;  Location: [facility] INVASIVE CV LAB;  Service: Cardiovascular;  Laterality: N/A;   SPINE SURGERY     TONSILLECTOMY     TOTAL KNEE ARTHROPLASTY Left    TRANSCATHETER AORTIC VALVE REPLACEMENT, TRANSFEMORAL N/A 08/06/2021   Procedure: TRANSCATHETER AORTIC VALVE REPLACEMENT, TRANSFEMORAL;  Surgeon: Sydney Pyo, MD;  Location: [facility] OR;  Service: Cardiovascular;  Laterality: N/A;    Current Medications: Current Meds  Medication Sig   aspirin EC 81 MG tablet Take 81 mg by mouth daily. Swallow whole.    Cholecalciferol (VITAMIN D3) 250 MCG (10000 UT) capsule Take 4,000 Units by mouth daily.   clotrimazole-betamethasone (LOTRISONE) cream Apply 1 Application topically daily. (Patient taking differently: Apply 1 Application topically daily as needed (skin condition). Per pt)   etanercept (ENBREL) 50 MG/ML injection Inject 50 mg into the skin every Sunday.    ezetimibe (ZETIA) 10 MG tablet TAKE 1 TABLET BY MOUTH DAILY   fluocinonide ointment (LIDEX) 0.05 % Apply 1 Application topically daily as needed.   HYDROcodone-acetaminophen (NORCO/VICODIN) 5-325 MG tablet Take 1 tablet by mouth daily.   levothyroxine (SYNTHROID) 75 MCG tablet TAKE 1 TABLET BY MOUTH DAILY  BEFORE BREAKFAST      ROS:   Please see the history of present illness.    All other systems reviewed and are negative.  EKGs   EKG:  EKG is NOT ordered today.   Recent Labs: 01/27/2023: TSH 1.23 02/17/2023: ALT 13; BUN 12; Creatinine 1.12; Potassium 4.3; Sodium 140 05/20/2023: Hemoglobin 14.9; Platelet Count 211  Recent Lipid Panel    Component Value Date/Time   CHOL 160 10/06/2022 1107   CHOL 155 06/06/2021 1012   TRIG 96 10/06/2022 1107   HDL 70 10/06/2022 1107   HDL 48 06/06/2021 1012   CHOLHDL 2.3 10/06/2022 1107   VLDL 23 04/09/2017 0844   LDLCALC 72 10/06/2022 1107     Risk Assessment/Calculations:            Physical Exam:    VS:  BP 128/76   Ht 5\' 4"  (1.626 m)   Wt 140 lb (63.5 kg)   BMI 24.03 kg/m     Wt Readings from Last 3 Encounters:  08/19/23 140 lb (63.5 kg)  08/17/23 141 lb 12.8 oz (64.3 kg)  05/20/23 140 lb 14.4 oz (63.9 kg)     GEN: Well nourished, well developed in no acute distress NECK: No JVD CARDIAC: RRR, soft flow murmurs @ RUSB. No rubs, gallops RESPIRATORY:  Clear to auscultation without rales, wheezing or rhonchi  ABDOMEN: Soft, non-tender, non-distended EXTREMITIES:  No edema; hands with RA deformity  ASSESSMENT:    1. S/P TAVR (transcatheter aortic valve replacement)   2.  Asymptomatic bilateral carotid artery stenosis   3. Coronary artery disease involving native coronary artery of native heart without angina pectoris   4. Essential hypertension   5. Rheumatoid arthritis, involving unspecified site, unspecified whether rheumatoid factor present (HCC)    PLAN:    In order of problems listed above:  Severe AS s/p TAVR: echo today shows EF 65%, normally functioning TAVR with a mean gradient of 9 mm hg and no PVL. She has NYHA class II symptoms. Continue on aspirin 81mg  daily alone. She has amoxicillin for SBE prophylaxis. She will continue regular follow up with Dr. Jacinto Halim  Carotid artery stenosis: carotid duplex 10/2022 showed 16-49% RICA stenosis and no  significant LICA stenosis. This had improved from previous study. Repeat scheduled for 10/2023. Continue Zetia 10mg  daily. Statin intolerant.   CAD: cardiac catheterization 06/21/21 showed moderate stenosis of the left circumflex with 60 to 70% proximal stenosis and an 80% calcific stenosis of a third marginal branch.  The LAD had mild nonobstructive disease.  The RCA had mild calcific plaque but no stenosis. Continue medical therapy with aspirin 81mg  daily. Statin intolerant due to myalgias.  HTN: BP well controlled. No change made   RA: continue Enbrel.   Medication Adjustments/Labs and Tests Ordered: Current medicines are reviewed at length with the patient today.  Concerns regarding medicines are outlined above.  No orders of the defined types were placed in this encounter.  No orders of the defined types were placed in this encounter.   Patient Instructions  Medication Instructions:  Your physician recommends that you continue on your current medications as directed. Please refer to the Current Medication list given to you today.   *If you need a refill on your cardiac medications before your next appointment, please call your pharmacy*   Lab Work: None ordered   If you have labs (blood work) drawn  today and your tests are completely normal, you will receive your results only by: MyChart Message (if you have MyChart) OR A paper copy in the mail If you have any lab test that is abnormal or we need to change your treatment, we will call you to review the results.   Testing/Procedures: None ordered    Follow-Up: Follow up as scheduled  Other Instructions      Signed, Cline Crock, PA-C  08/19/2023 2:44 PM    [facility]

## 2023-08-24 DIAGNOSIS — Z96651 Presence of right artificial knee joint: Secondary | ICD-10-CM | POA: Diagnosis not present

## 2023-08-24 DIAGNOSIS — H40022 Open angle with borderline findings, high risk, left eye: Secondary | ICD-10-CM | POA: Diagnosis not present

## 2023-08-24 DIAGNOSIS — M25561 Pain in right knee: Secondary | ICD-10-CM | POA: Diagnosis not present

## 2023-08-24 DIAGNOSIS — H401111 Primary open-angle glaucoma, right eye, mild stage: Secondary | ICD-10-CM | POA: Diagnosis not present

## 2023-09-14 DIAGNOSIS — L409 Psoriasis, unspecified: Secondary | ICD-10-CM | POA: Diagnosis not present

## 2023-09-14 DIAGNOSIS — M2550 Pain in unspecified joint: Secondary | ICD-10-CM | POA: Diagnosis not present

## 2023-09-14 DIAGNOSIS — M8580 Other specified disorders of bone density and structure, unspecified site: Secondary | ICD-10-CM | POA: Diagnosis not present

## 2023-09-14 DIAGNOSIS — D751 Secondary polycythemia: Secondary | ICD-10-CM | POA: Diagnosis not present

## 2023-09-14 DIAGNOSIS — M549 Dorsalgia, unspecified: Secondary | ICD-10-CM | POA: Diagnosis not present

## 2023-09-14 DIAGNOSIS — L4050 Arthropathic psoriasis, unspecified: Secondary | ICD-10-CM | POA: Diagnosis not present

## 2023-09-14 DIAGNOSIS — M1990 Unspecified osteoarthritis, unspecified site: Secondary | ICD-10-CM | POA: Diagnosis not present

## 2023-09-14 DIAGNOSIS — Z79899 Other long term (current) drug therapy: Secondary | ICD-10-CM | POA: Diagnosis not present

## 2023-09-18 ENCOUNTER — Telehealth: Payer: Self-pay | Admitting: Nurse Practitioner

## 2023-09-18 NOTE — Telephone Encounter (Signed)
09/18/23- [facility] has call pt 3x they will not reach out again, I have LM providing the scheduling # to [facility] on VM and asked pt to call [facility] to set up the appt/ Alcario Drought

## 2023-09-18 NOTE — Telephone Encounter (Signed)
Called patient and no answer left message to return call.

## 2023-09-21 ENCOUNTER — Other Ambulatory Visit: Payer: Self-pay | Admitting: Nurse Practitioner

## 2023-09-21 ENCOUNTER — Encounter: Payer: Self-pay | Admitting: Internal Medicine

## 2023-09-21 MED ORDER — HYDROCODONE-ACETAMINOPHEN 5-325 MG PO TABS: 1.0000 | ORAL_TABLET | Freq: Every day | ORAL | 0 refills | Status: DC

## 2023-09-21 NOTE — Telephone Encounter (Signed)
Patient called requesting refill of hydrocodone 5/325 to be sent to the [facility] on [address].

## 2023-09-21 NOTE — Telephone Encounter (Signed)
Patient is requesting a refill of the following medications: Requested Prescriptions   Pending Prescriptions Disp Refills   HYDROcodone-acetaminophen (NORCO/VICODIN) 5-325 MG tablet 30 tablet 0    Sig: Take 1 tablet by mouth daily.    Date of last refill: 08/17/23  Refill amount: 30  Treatment agreement date: December 2023

## 2023-09-30 DIAGNOSIS — I739 Peripheral vascular disease, unspecified: Secondary | ICD-10-CM | POA: Diagnosis not present

## 2023-09-30 DIAGNOSIS — B351 Tinea unguium: Secondary | ICD-10-CM | POA: Diagnosis not present

## 2023-09-30 DIAGNOSIS — M792 Neuralgia and neuritis, unspecified: Secondary | ICD-10-CM | POA: Diagnosis not present

## 2023-09-30 DIAGNOSIS — L84 Corns and callosities: Secondary | ICD-10-CM | POA: Diagnosis not present

## 2023-09-30 DIAGNOSIS — L603 Nail dystrophy: Secondary | ICD-10-CM | POA: Diagnosis not present

## 2023-09-30 DIAGNOSIS — L4052 Psoriatic arthritis mutilans: Secondary | ICD-10-CM | POA: Diagnosis not present

## 2023-10-12 ENCOUNTER — Encounter: Payer: Self-pay | Admitting: Nurse Practitioner

## 2023-10-12 ENCOUNTER — Ambulatory Visit (INDEPENDENT_AMBULATORY_CARE_PROVIDER_SITE_OTHER): Payer: Medicare Other | Admitting: Nurse Practitioner

## 2023-10-12 VITALS — BP 122/74 | HR 78 | Temp 96.7°F | Resp 20 | Ht 64.0 in | Wt 143.0 lb

## 2023-10-12 DIAGNOSIS — L4052 Psoriatic arthritis mutilans: Secondary | ICD-10-CM

## 2023-10-12 DIAGNOSIS — Z23 Encounter for immunization: Secondary | ICD-10-CM | POA: Diagnosis not present

## 2023-10-12 DIAGNOSIS — E039 Hypothyroidism, unspecified: Secondary | ICD-10-CM

## 2023-10-12 DIAGNOSIS — E782 Mixed hyperlipidemia: Secondary | ICD-10-CM | POA: Diagnosis not present

## 2023-10-12 DIAGNOSIS — M150 Primary generalized (osteo)arthritis: Secondary | ICD-10-CM | POA: Diagnosis not present

## 2023-10-12 DIAGNOSIS — R29898 Other symptoms and signs involving the musculoskeletal system: Secondary | ICD-10-CM | POA: Diagnosis not present

## 2023-10-12 NOTE — Progress Notes (Signed)
Careteam: Patient Care Team: [name], NP as PCP - General (Geriatric Medicine) Yates Decamp, MD as PCP - Cardiology (Cardiology) Timoteo Ace, MD as Referring Physician (Ophthalmology) Casimer Lanius, MD as Consulting Physician (Rheumatology)  PLACE OF SERVICE:  [facility]  Advanced Directive information    Allergies  Allergen Reactions   Codeine Nausea And Vomiting    Weakness and dysequilibrium   Statins Hives    Myalgias     Chief Complaint  Patient presents with   Medical Management of Chronic Issues    Patient presents today for a 6 month follow-up   Quality Metric Gaps    DEXA scan, TDAP, COVID#6     HPI: Patient is a 82 y.o. female for routine follow up.   She reports she has been having a horrible time with psoriasis. Reports it is all over her body and is horrible.  She went to the dermatologist and gave her a cream- which she was unable to use because she can not put anything on her back.   She went to the rheumatologist and he is was not helpful either. She itches all the time.  She is able to ignore during the day.   She quit taking her zetia because she thought that may be contributing. She wants to give that a month to see if it will help. She also felt like it was making her legs weaker.   She went to see orthopedic due to her knees not being able to hold her up and left hip collapsed. She went to the orthopedic. She is doing leg exercises which seem to help some. Orthopedic told her to be careful since she can not use a can due to her hands.    Review of Systems:  Review of Systems  Constitutional:  Negative for chills, fever and weight loss.  HENT:  Negative for tinnitus.   Respiratory:  Negative for cough, sputum production and shortness of breath.   Cardiovascular:  Negative for chest pain, palpitations and leg swelling.  Gastrointestinal:  Negative for abdominal pain, constipation, diarrhea and heartburn.  Genitourinary:   Negative for dysuria, frequency and urgency.  Musculoskeletal:  Negative for back pain, falls, joint pain and myalgias.  Skin:  Positive for itching and rash.  Neurological:  Negative for dizziness and headaches.  Psychiatric/Behavioral:  Negative for depression and memory loss. The patient does not have insomnia.     Past Medical History:  Diagnosis Date   Complication of anesthesia    Difficult intubation    20-25 years ago   H/O total knee replacement 10/28/1999   Right   High cholesterol    History of tonsillectomy and adenoidectomy 10/27/1944   History of transcatheter aortic valve replacement (TAVR) 08/06/2021   s/p Edwards 23mm S3 vis TF approach with Dr. Excell Seltzer and Dr. Laneta Simmers   Psoriatic arthritis [facility]) 10/27/1988   Sleep apnea with use of continuous positive airway pressure (CPAP) 10/27/2002   Thyroid disease    Past Surgical History:  Procedure Laterality Date   CARDIAC CATHETERIZATION     CATARACT EXTRACTION W/ INTRAOCULAR LENS IMPLANT Bilateral    FOOT SURGERY     fused arch in left foot   GROIN DISSECTION  08/06/2021   Procedure: Drucie Ip EXPLORATION ;  Surgeon: Maeola Harman, MD;  Location: [facility] OR;  Service: Vascular;;   INTRAOPERATIVE TRANSTHORACIC ECHOCARDIOGRAM N/A 08/06/2021   Procedure: INTRAOPERATIVE TRANSTHORACIC ECHOCARDIOGRAM;  Surgeon: Orbie Pyo, MD;  Location: [facility] OR;  Service: Cardiovascular;  Laterality: N/A;   LOWER EXTREMITY ANGIOGRAM  08/06/2021   Procedure: LEFT LOWER EXTREMITY ANGIOGRAM;  Surgeon: Orbie Pyo, MD;  Location: [facility] OR;  Service: Cardiovascular;;   PATCH ANGIOPLASTY  08/06/2021   Procedure: PATCH ANGIOPLASTY OF FEMORAL ARTERY USING LEFT GREATER SAPHENOUS VEIN;  Surgeon: Maeola Harman, MD;  Location: [facility] OR;  Service: Vascular;;   RIGHT/LEFT HEART CATH AND CORONARY ANGIOGRAPHY N/A 04/16/2021   Procedure: RIGHT/LEFT HEART CATH AND CORONARY ANGIOGRAPHY;  Surgeon: Yates Decamp, MD;  [facility]: [facility] INVASIVE CV LAB;   Service: Cardiovascular;  Laterality: N/A;   SPINE SURGERY     TONSILLECTOMY     TOTAL KNEE ARTHROPLASTY Left    TRANSCATHETER AORTIC VALVE REPLACEMENT, TRANSFEMORAL N/A 08/06/2021   Procedure: TRANSCATHETER AORTIC VALVE REPLACEMENT, TRANSFEMORAL;  Surgeon: Orbie Pyo, MD;  Location: [facility] OR;  Service: Cardiovascular;  Laterality: N/A;   Social History:   reports that she quit smoking about 49 years ago. Her smoking use included cigarettes. She started smoking about 68 years ago. She has a 36 pack-year smoking history. She has never used smokeless tobacco. She reports that she does not currently use alcohol. She reports that she does not use drugs.  Family History  Problem Relation Age of Onset   Heart attack Father    Heart disease Sister     Medications: Patient's Medications  New Prescriptions   No medications on file  Previous Medications   ASPIRIN EC 81 MG TABLET    Take 81 mg by mouth daily. Swallow whole.   CHOLECALCIFEROL (VITAMIN D3) 250 MCG (10000 UT) CAPSULE    Take 4,000 Units by mouth daily.   CLOTRIMAZOLE-BETAMETHASONE (LOTRISONE) CREAM    Apply 1 Application topically daily.   ETANERCEPT (ENBREL) 50 MG/ML INJECTION    Inject 50 mg into the skin every Sunday.    EZETIMIBE (ZETIA) 10 MG TABLET    TAKE 1 TABLET BY MOUTH DAILY   FLUOCINONIDE OINTMENT (LIDEX) 0.05 %    Apply 1 Application topically daily as needed.   HYDROCODONE-ACETAMINOPHEN (NORCO/VICODIN) 5-325 MG TABLET    Take 1 tablet by mouth daily.   LEVOTHYROXINE (SYNTHROID) 75 MCG TABLET    TAKE 1 TABLET BY MOUTH DAILY  BEFORE BREAKFAST  Modified Medications   No medications on file  Discontinued Medications   No medications on file    Physical Exam:  Vitals:   10/12/23 1038  BP: 122/74  Pulse: 78  Resp: 20  Temp: (!) 96.7 F (35.9 C)  SpO2: 98%  Weight: 143 lb (64.9 kg)  Height: 5\' 4"  (1.626 m)   Body mass index is 24.55 kg/m. Wt Readings from Last 3 Encounters:  10/12/23 143 lb (64.9 kg)   08/19/23 140 lb (63.5 kg)  08/17/23 141 lb 12.8 oz (64.3 kg)    Physical Exam Constitutional:      General: She is not in acute distress.    Appearance: She is well-developed. She is not diaphoretic.  HENT:     Head: Normocephalic and atraumatic.     Mouth/Throat:     Pharynx: No oropharyngeal exudate.  Eyes:     Conjunctiva/sclera: Conjunctivae normal.     Pupils: Pupils are equal, round, and reactive to light.  Cardiovascular:     Rate and Rhythm: Normal rate and regular rhythm.     Heart sounds: Normal heart sounds.  Pulmonary:     Effort: Pulmonary effort is normal.     Breath sounds: Normal breath sounds.  Abdominal:  General: Bowel sounds are normal.     Palpations: Abdomen is soft.  Musculoskeletal:     Cervical back: Normal range of motion and neck supple.     Right lower leg: No edema.     Left lower leg: No edema.  Skin:    General: Skin is warm and dry.  Neurological:     Mental Status: She is alert.  Psychiatric:        Mood and Affect: Mood normal.     Labs reviewed: Basic Metabolic Panel: Recent Labs    11/18/22 1016 01/27/23 0950 02/17/23 1023  NA 141  --  140  K 4.3  --  4.3  CL 107  --  105  CO2 27  --  28  GLUCOSE 84  --  91  BUN 19  --  12  CREATININE 1.10*  --  1.12*  CALCIUM 9.6  --  9.9  TSH  --  1.23  --    Liver Function Tests: Recent Labs    11/18/22 1016 02/17/23 1023  AST 19 22  ALT 15 13  ALKPHOS 69 68  BILITOT 0.7 0.7  PROT 7.4 7.7  ALBUMIN 4.1 4.3   No results for input(s): "LIPASE", "AMYLASE" in the last 8760 hours. No results for input(s): "AMMONIA" in the last 8760 hours. CBC: Recent Labs    11/18/22 1016 02/17/23 1023 05/20/23 1056  WBC 6.1 6.1 6.5  NEUTROABS 3.7 3.3 4.0  HGB 14.6 14.8 14.9  HCT 46.6* 46.4* 46.8*  MCV 84.4 84.8 84.8  PLT 209 222 211   Lipid Panel: No results for input(s): "CHOL", "HDL", "LDLCALC", "TRIG", "CHOLHDL", "LDLDIRECT" in the last 8760 hours. TSH: Recent Labs     01/27/23 0950  TSH 1.23   A1C: No results found for: "HGBA1C"   Assessment/Plan 1. Acquired hypothyroidism Continues on synthroid 75 mcg - TSH  2. Bilateral leg weakness (Primary) -reports ongoing leg weakness and has had fall.  Important for strengthen exercises.  - Ambulatory referral to Physical Therapy  3. Mixed hyperlipidemia She has stopped zetia, encouraged to restart at this time  - Lipid panel - COMPLETE METABOLIC PANEL WITH GFR - CBC with Differential/Platelet  4. Primary osteoarthritis involving multiple joints -ongoing continues hydrocodone- apap PRN - Ambulatory referral to Physical Therapy - COMPLETE METABOLIC PANEL WITH GFR - CBC with Differential/Platelet  5. Need for influenza vaccination - Flu Vaccine Trivalent High Dose (Fluad)  6. Psoriatic arthritis, 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HCC) Continues on enbrel.  Looking for a second opinion.  - Ambulatory referral to Rheumatology - COMPLETE METABOLIC PANEL WITH GFR - CBC with Differential/Platelet - Sedimentation Rate - C-reactive Protein   Return in about 6 months (around 04/11/2024) for routine follow up .  Sydney Reid. Biagio Borg [facility] [phone]

## 2023-10-13 LAB — COMPLETE METABOLIC PANEL WITH GFR
AG Ratio: 1.3 (calc) (ref 1.0–2.5)
ALT: 14 U/L (ref 6–29)
AST: 21 U/L (ref 10–35)
Albumin: 4.4 g/dL (ref 3.6–5.1)
Alkaline phosphatase (APISO): 75 U/L (ref 37–153)
BUN: 15 mg/dL (ref 7–25)
CO2: 26 mmol/L (ref 20–32)
Calcium: 10 mg/dL (ref 8.6–10.4)
Chloride: 105 mmol/L (ref 98–110)
Creat: 0.91 mg/dL (ref 0.60–0.95)
Globulin: 3.3 g/dL (ref 1.9–3.7)
Glucose, Bld: 102 mg/dL (ref 65–139)
Potassium: 4.4 mmol/L (ref 3.5–5.3)
Sodium: 141 mmol/L (ref 135–146)
Total Bilirubin: 0.4 mg/dL (ref 0.2–1.2)
Total Protein: 7.7 g/dL (ref 6.1–8.1)
eGFR: 63 mL/min/{1.73_m2} (ref >=60)

## 2023-10-13 LAB — CBC WITH DIFFERENTIAL/PLATELET
Absolute Lymphocytes: 1879 {cells}/uL (ref 850–3900)
Absolute Monocytes: 724 {cells}/uL (ref 200–950)
Basophils Absolute: 8 {cells}/uL (ref 0–200)
Basophils Relative: 0.1 %
Eosinophils Absolute: 0 {cells}/uL — ABNORMAL LOW (ref 15–500)
Eosinophils Relative: 0 %
HCT: 45.7 % — ABNORMAL HIGH (ref 35.0–45.0)
Hemoglobin: 14.4 g/dL (ref 11.7–15.5)
MCH: 26.8 pg — ABNORMAL LOW (ref 27.0–33.0)
MCHC: 31.5 g/dL — ABNORMAL LOW (ref 32.0–36.0)
MCV: 85.1 fL (ref 80.0–100.0)
MPV: 10.5 fL (ref 7.5–12.5)
Monocytes Relative: 9.4 %
Neutro Abs: 5090 {cells}/uL (ref 1500–7800)
Neutrophils Relative %: 66.1 %
Platelets: 278 10*3/uL (ref 140–400)
RBC: 5.37 10*6/uL — ABNORMAL HIGH (ref 3.80–5.10)
RDW: 13 % (ref 11.0–15.0)
Total Lymphocyte: 24.4 %
WBC: 7.7 10*3/uL (ref 3.8–10.8)

## 2023-10-13 LAB — LIPID PANEL
Cholesterol: 227 mg/dL — ABNORMAL HIGH (ref <200)
HDL: 53 mg/dL (ref >=50)
LDL Cholesterol (Calc): 136 mg/dL — ABNORMAL HIGH
Non-HDL Cholesterol (Calc): 174 mg/dL — ABNORMAL HIGH (ref <130)
Total CHOL/HDL Ratio: 4.3 (calc) (ref <5.0)
Triglycerides: 233 mg/dL — ABNORMAL HIGH (ref <150)

## 2023-10-13 LAB — SEDIMENTATION RATE: Sed Rate: 17 mm/h (ref 0–30)

## 2023-10-13 LAB — C-REACTIVE PROTEIN: CRP: <3 mg/L (ref <8.0)

## 2023-10-13 LAB — TSH: TSH: 2.09 m[IU]/L (ref 0.40–4.50)

## 2023-10-27 ENCOUNTER — Other Ambulatory Visit: Payer: Self-pay

## 2023-10-27 NOTE — Telephone Encounter (Signed)
 Patient is requesting a refill of the following medications: Requested Prescriptions   Pending Prescriptions Disp Refills   HYDROcodone -acetaminophen  (NORCO/VICODIN) 5-325 MG tablet 30 tablet 0    Sig: Take 1 tablet by mouth daily.    Date of last refill: 09/21/1923  Refill amount: 30  Treatment agreement date: 09/2022, notation made on pending appointment to update

## 2023-10-28 MED ORDER — HYDROCODONE-ACETAMINOPHEN 5-325 MG PO TABS: 1.0000 | ORAL_TABLET | Freq: Every day | ORAL | 0 refills | Status: DC

## 2023-10-30 ENCOUNTER — Other Ambulatory Visit: Payer: Self-pay

## 2023-10-30 MED ORDER — HYDROCODONE-ACETAMINOPHEN 5-325 MG PO TABS: 1.0000 | ORAL_TABLET | Freq: Every day | ORAL | 0 refills | Status: DC

## 2023-10-30 NOTE — Telephone Encounter (Signed)
 Called pharmacy and notified.

## 2023-10-30 NOTE — Telephone Encounter (Signed)
 Please call and cancel Rx that was sent to mail order pharmacy

## 2023-10-30 NOTE — Telephone Encounter (Signed)
 Patient called and states medication wasn't suppose to be sent to [facility]. Its suppose to be sent to [facility]. Medication pend and sent to PCP Janyth Contes Janene Harvey, NP

## 2023-11-03 ENCOUNTER — Telehealth: Payer: Self-pay | Admitting: Cardiology

## 2023-11-03 DIAGNOSIS — I6523 Occlusion and stenosis of bilateral carotid arteries: Secondary | ICD-10-CM

## 2023-11-03 NOTE — Telephone Encounter (Signed)
 Pt called in to cancel her carotid due to a conflict. Her expires on 11/06/22, can a new order be placed please. Her new appt is 11/19/23

## 2023-11-03 NOTE — Telephone Encounter (Signed)
 Placed an updated order for bilateral carotid US.  Current order expires 11/07/23 visit scheduled for 11/19/23.

## 2023-11-04 ENCOUNTER — Ambulatory Visit (HOSPITAL_COMMUNITY): Admission: RE | Admit: 2023-11-04 | Payer: Medicare Other | Source: Ambulatory Visit

## 2023-11-04 ENCOUNTER — Other Ambulatory Visit: Payer: Medicare Other

## 2023-11-04 ENCOUNTER — Other Ambulatory Visit (HOSPITAL_COMMUNITY): Payer: Medicare Other

## 2023-11-12 ENCOUNTER — Ambulatory Visit: Payer: Self-pay | Admitting: Cardiology

## 2023-11-19 ENCOUNTER — Ambulatory Visit (HOSPITAL_COMMUNITY)
Admission: RE | Admit: 2023-11-19 | Discharge: 2023-11-19 | Disposition: A | Discharge status: Home / Self Care | Payer: Medicare Other | Source: Ambulatory Visit | Attending: Internal Medicine | Admitting: Internal Medicine

## 2023-11-19 DIAGNOSIS — I6523 Occlusion and stenosis of bilateral carotid arteries: Secondary | ICD-10-CM | POA: Diagnosis not present

## 2023-11-19 NOTE — Progress Notes (Signed)
Very mild plaque in carotid arteries, overall very stable, no further follow-up studies indicated.

## 2023-11-20 ENCOUNTER — Encounter: Payer: Self-pay | Admitting: Physical Therapy

## 2023-11-20 ENCOUNTER — Ambulatory Visit (INDEPENDENT_AMBULATORY_CARE_PROVIDER_SITE_OTHER): Payer: Medicare Other | Admitting: Physical Therapy

## 2023-11-20 ENCOUNTER — Other Ambulatory Visit: Payer: Self-pay

## 2023-11-20 DIAGNOSIS — R2681 Unsteadiness on feet: Secondary | ICD-10-CM

## 2023-11-20 DIAGNOSIS — R42 Dizziness and giddiness: Secondary | ICD-10-CM

## 2023-11-20 DIAGNOSIS — M6281 Muscle weakness (generalized): Secondary | ICD-10-CM | POA: Diagnosis not present

## 2023-11-20 NOTE — Therapy (Signed)
OUTPATIENT PHYSICAL THERAPY LOWER EXTREMITY EVALUATION   Patient Name: Sydney Reid MRN: [id number] DOB:03-Oct-1941, 83 y.o., female Today's Date: 11/20/2023  END OF SESSION:  PT End of Session - 11/20/23 1403     Visit Number 1    Number of Visits 13    Date for PT Re-Evaluation 01/01/24    Authorization Type MCR and BCBS    Authorization Time Period 11/20/23 to 01/01/24    Progress Note Due on Visit 10    PT Start Time 1302    PT Stop Time 1345    PT Time Calculation (min) 43 min    Activity Tolerance Patient tolerated treatment well    Behavior During Therapy [facility] for tasks assessed/performed             Past Medical History:  Diagnosis Date   Complication of anesthesia    Difficult intubation    20-25 years ago   H/O total knee replacement 10/28/1999   Right   High cholesterol    History of tonsillectomy and adenoidectomy 10/27/1944   History of transcatheter aortic valve replacement (TAVR) 08/06/2021   s/p Edwards 23mm S3 vis TF approach with Dr. Excell Seltzer and Dr. Laneta Simmers   Psoriatic arthritis (HCC) 10/27/1988   Sleep apnea with use of continuous positive airway pressure (CPAP) 10/27/2002   Thyroid disease    Past Surgical History:  Procedure Laterality Date   CARDIAC CATHETERIZATION     CATARACT EXTRACTION W/ INTRAOCULAR LENS IMPLANT Bilateral    FOOT SURGERY     fused arch in left foot   GROIN DISSECTION  08/06/2021   Procedure: Drucie Ip EXPLORATION ;  Surgeon: Maeola Harman, MD;  Location: [facility] OR;  Service: Vascular;;   INTRAOPERATIVE TRANSTHORACIC ECHOCARDIOGRAM N/A 08/06/2021   Procedure: INTRAOPERATIVE TRANSTHORACIC ECHOCARDIOGRAM;  Surgeon: Orbie Pyo, MD;  Location: [facility] OR;  Service: Cardiovascular;  Laterality: N/A;   LOWER EXTREMITY ANGIOGRAM  08/06/2021   Procedure: LEFT LOWER EXTREMITY ANGIOGRAM;  Surgeon: Orbie Pyo, MD;  Location: [facility] OR;  Service: Cardiovascular;;   PATCH ANGIOPLASTY  08/06/2021   Procedure: PATCH  ANGIOPLASTY OF FEMORAL ARTERY USING LEFT GREATER SAPHENOUS VEIN;  Surgeon: Maeola Harman, MD;  Location: [facility] OR;  Service: Vascular;;   RIGHT/LEFT HEART CATH AND CORONARY ANGIOGRAPHY N/A 04/16/2021   Procedure: RIGHT/LEFT HEART CATH AND CORONARY ANGIOGRAPHY;  Surgeon: Yates Decamp, MD;  Location: [facility] INVASIVE CV LAB;  Service: Cardiovascular;  Laterality: N/A;   SPINE SURGERY     TONSILLECTOMY     TOTAL KNEE ARTHROPLASTY Left    TRANSCATHETER AORTIC VALVE REPLACEMENT, TRANSFEMORAL N/A 08/06/2021   Procedure: TRANSCATHETER AORTIC VALVE REPLACEMENT, TRANSFEMORAL;  Surgeon: Orbie Pyo, MD;  Location: [facility] OR;  Service: Cardiovascular;  Laterality: N/A;   Patient Active Problem List   Diagnosis Date Noted   Epiretinal membrane (ERM) of right eye 08/28/2021   Fuchs' corneal dystrophy 08/28/2021   Peripheral vascular disease (HCC) 08/28/2021   Cataract, bilateral 08/28/2021   Unspecified atherosclerosis of native arteries of extremities, bilateral legs (HCC) 08/28/2021   S/P TAVR (transcatheter aortic valve replacement) 08/06/2021   Asymptomatic bilateral carotid artery stenosis 05/09/2021   Severe aortic stenosis 04/16/2021   Polycythemia vera (HCC) 02/14/2021   Osteopenia 01/02/2021   Pain in right knee 07/04/2020   Hypothyroidism due to acquired atrophy of thyroid 05/12/2018   Dizziness and giddiness 05/12/2018   Closed fracture of proximal end of left humerus 02/25/2018   Chronic pain syndrome 07/08/2017   Psoriatic arthritis, 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HCC)  07/08/2017   Hyperlipidemia 07/14/2016   Dermatochalasis of both upper eyelids 08/31/2014   Left foot pain 10/04/2012   Lumbosacral spondylosis without myelopathy 08/07/2011   Arthropathy of cervical spine 07/19/2011   Degenerative joint disease of cervical spine 07/19/2011   Cellulitis and abscess 06/20/2010   Hypothyroidism 06/20/2010   Psoriasis with arthropathy (HCC) 06/20/2010   OSA treated with BiPAP 06/20/2010    Sialoadenitis 03/04/2007    PCP: [name], NP  REFERRING PROVIDER: [name], NP  REFERRING DIAG: Diagnosis R29.898 (ICD-10-CM) - Bilateral leg weakness M15.0 (ICD-10-CM) - Primary osteoarthritis involving multiple joints  THERAPY DIAG:  Unsteadiness on feet  Muscle weakness (generalized)  Dizziness and giddiness  Rationale for Evaluation and Treatment: Rehabilitation  ONSET DATE: chronic   SUBJECTIVE:   SUBJECTIVE STATEMENT: I've had psoriatic arthritis my whole life, ever since I was a child. I wasn't diagnosed until I was 27, I was very affected by it and was told I would be in a WC by the time I was 39. Exercised regularly every week my entire life until I moved here to [place] 8 years ago- it was a case of just quitting I guess. I've become very unsteady on my feet, had one fall due to tripping on step up from sun room to kitchen. I'm pretty self sufficient,would like to stay this way. If I stand for more than 10-15 minutes I stiffen up, can't walk as much as I used to due to back pain. Most concerned about balance, the unsteadiness has gotten to the point that I'm very conscious of it. I've been dizzy for years, went through a lot of tests years ago and ended up with an aortic heart valve replacement; fell about 20 years ago, hit wall hard with L ear and was told I had a mild concussion, wasn't dizzy afterwards but the dizziness has gotten progressively worse ever since that time   PERTINENT HISTORY: See above  PAIN:  Are you having pain? No "I've had pain my whole life, I've gotten so used to it that I don't feel it until I go to bed at night"   PRECAUTIONS: Fall and Other: hx destructive psoriatic arthritis    RED FLAGS: None   WEIGHT BEARING RESTRICTIONS: No  FALLS:  Has patient fallen in last 6 months? Yes. Number of falls 1- tripped on step in home; no FOF   LIVING ENVIRONMENT: Lives with: lives alone Lives in: Other townhouse  Stairs: 5 STE in the  front, 3 STE from garage; second floor with rails; also has one step from main level to sunroom  Has following equipment at home: Dan Humphreys - 2 wheeled  OCCUPATION: retired- used to work in Engineer, agricultural with [employer]    PLOF: Independent, Independent with basic ADLs, Independent with gait, and Independent with transfers  PATIENT GOALS: improve balance  NEXT MD VISIT: Referring October 2025  OBJECTIVE:  Note: Objective measures were completed at Evaluation unless otherwise noted.    PATIENT SURVEYS:  FOTO 41, predicted 47 in 13 visits   COGNITION: Overall cognitive status: Within functional limits for tasks assessed     SENSATION: WFL    LOWER EXTREMITY MMT:  MMT Right eval Left eval  Hip flexion 4+ 3+  Hip extension    Hip abduction 4+ 3  Hip adduction    Hip internal rotation    Hip external rotation    Knee flexion 4+ 4  Knee extension 5 4+  Ankle dorsiflexion 4+ 4+  Ankle plantarflexion    Ankle inversion 5 5  Ankle eversion 5 5   (Blank rows = not tested)    FUNCTIONAL TESTS:  3 minute walk test: TBD Berg Balance Scale: 38/56                                                                                                                                  TREATMENT DATE:     11/20/23  Eval, care planning, HEP  Bridges x5 Clams L LE x10 red TB STS red TB around knees x3 Discussed/demonstrated narrow BOS with chair    PATIENT EDUCATION:  Education details: exam findings, POC, HEP, plan to assess and tx vertigo during POC, assured her that L sided weakness is not necessarily indicative of serious neuro condition like CVA or TIA  Person educated: Patient Education method: Explanation, Demonstration, and Handouts Education comprehension: verbalized understanding, returned demonstration, and needs further education  HOME EXERCISE PROGRAM: Access Code: [email or web address] URL: [email or web address] Date: 11/20/2023 Prepared by:  Nedra Hai  Exercises - Supine Bridge  - 1 x daily - 7 x weekly - 2 sets - 10 reps - 1 seconds  hold - Clamshell with Resistance  - 1 x daily - 7 x weekly - 2 sets - 10 reps - 1 second  hold - Sit to Stand with Resistance Around Legs  - 1 x daily - 7 x weekly - 1 sets - 10 reps - Standing Narrow Base of Support with Chair  - 1 x daily - 7 x weekly - 2 sets - 5 reps - 10-20 seconds  hold  ASSESSMENT:  CLINICAL IMPRESSION: Patient is a 83 y.o. F who was seen today for physical therapy evaluation and treatment for Diagnosis R29.898 (ICD-10-CM) - Bilateral leg weakness M15.0 (ICD-10-CM) - Primary osteoarthritis involving multiple joints. She does have an involved history of psoriatic arthritis along with ongoing dizziness in addition to current c/o unsteadiness and weakness. Will benefit from skilled PT services to address functional impairments, reduce fall risk, and assist   OBJECTIVE IMPAIRMENTS: Abnormal gait, decreased activity tolerance, decreased balance, decreased knowledge of use of DME, decreased mobility, difficulty walking, decreased strength, and postural dysfunction.   ACTIVITY LIMITATIONS: standing, transfers, and locomotion level  PARTICIPATION LIMITATIONS: driving, shopping, and community activity  PERSONAL FACTORS: Age, Behavior pattern, Fitness, Past/current experiences, Social background, and Time since onset of injury/illness/exacerbation are also affecting patient's functional outcome.   REHAB POTENTIAL: Fair chronic destructive psoriatic arthritis  CLINICAL DECISION MAKING: Evolving/moderate complexity  EVALUATION COMPLEXITY: Moderate   GOALS: Goals reviewed with patient? No  SHORT TERM GOALS: Target date: 12/11/2023   Will be compliant with appropriate progressive HEP  Baseline: Goal status: INITIAL  2.  Vestibular w/u to have been completed/exercises assigned  Baseline:  Goal status: INITIAL    LONG TERM GOALS: Target date: 01/01/2024    MMT to have  improved by at least one grade in all weak groups  Baseline:  Goal status: INITIAL  2.  Will score at least 48 on the Berg to show improved balance skills  Baseline:  Goal status: INITIAL  3.  Education to be provided on appropriate local group exercise classes to assist her in being more active on an independent basis  Baseline:  Goal status: INITIAL  4.  Will be able to ascend/descent steps with U UE support and minimal unsteadiness  Baseline:  Goal status: INITIAL  5.  FOTO score to be at predicted value by time of DC  Baseline:  Goal status: INITIAL     PLAN:  PT FREQUENCY: 2x/week  PT DURATION: 6 weeks  PLANNED INTERVENTIONS: 97164- PT Re-evaluation, 97110-Therapeutic exercises, 97530- Therapeutic activity, [id number]- Neuromuscular re-education, [phone]- Self Care, 60454- Gait training, [phone]- Aquatic Therapy, and Balance training  PLAN FOR NEXT SESSION: prioritize vestibular w/u- thinking it may be bppv as she has had this in the past and dizziness is having a big impact on her fall risk. General strengthening and balance training otherwise, help her to find ground exercise/activity classes to improve independent activity level  Nedra Hai, PT, DPT 11/20/23 2:12 PM

## 2023-12-03 ENCOUNTER — Other Ambulatory Visit: Payer: Self-pay

## 2023-12-03 MED ORDER — HYDROCODONE-ACETAMINOPHEN 5-325 MG PO TABS: 1.0000 | ORAL_TABLET | Freq: Every day | ORAL | 0 refills | Status: DC

## 2023-12-03 NOTE — Telephone Encounter (Signed)
 Patient is requesting a refill of the following medications: Requested Prescriptions   Pending Prescriptions Disp Refills   HYDROcodone -acetaminophen  (NORCO/VICODIN) 5-325 MG tablet 30 tablet 0    Sig: Take 1 tablet by mouth daily.    Date of last refill: 10/30/23  Refill amount: 30  Treatment agreement date: outdated, noted on pending appointment 04/18/2024

## 2023-12-04 ENCOUNTER — Encounter: Payer: Medicare Other | Admitting: Physical Therapy

## 2023-12-07 ENCOUNTER — Encounter: Payer: Self-pay | Admitting: Physical Therapy

## 2023-12-07 ENCOUNTER — Ambulatory Visit (INDEPENDENT_AMBULATORY_CARE_PROVIDER_SITE_OTHER): Payer: Medicare Other | Admitting: Physical Therapy

## 2023-12-07 DIAGNOSIS — R2689 Other abnormalities of gait and mobility: Secondary | ICD-10-CM | POA: Diagnosis not present

## 2023-12-07 DIAGNOSIS — M6281 Muscle weakness (generalized): Secondary | ICD-10-CM | POA: Diagnosis not present

## 2023-12-07 DIAGNOSIS — R2681 Unsteadiness on feet: Secondary | ICD-10-CM

## 2023-12-07 DIAGNOSIS — R42 Dizziness and giddiness: Secondary | ICD-10-CM | POA: Diagnosis not present

## 2023-12-07 NOTE — Therapy (Signed)
 OUTPATIENT PHYSICAL THERAPY TREATMENT   Patient Name: Sydney Reid MRN: [id number] DOB:May 25, 1941, 83 y.o., female Today's Date: 12/07/2023  END OF SESSION:  PT End of Session - 12/07/23 1513     Visit Number 2    Number of Visits 13    Date for PT Re-Evaluation 01/01/24    Authorization Type MCR and BCBS    Authorization Time Period 11/20/23 to 01/01/24    Progress Note Due on Visit 10    PT Start Time 1512    PT Stop Time 1550    PT Time Calculation (min) 38 min    Activity Tolerance Patient tolerated treatment well    Behavior During Therapy [facility] for tasks assessed/performed             Past Medical History:  Diagnosis Date   Complication of anesthesia    Difficult intubation    20-25 years ago   H/O total knee replacement 10/28/1999   Right   High cholesterol    History of tonsillectomy and adenoidectomy 10/27/1944   History of transcatheter aortic valve replacement (TAVR) 08/06/2021   s/p Edwards 23mm S3 vis TF approach with Dr. Arlester Ladd and Dr. Sherene Dilling   Psoriatic arthritis (HCC) 10/27/1988   Sleep apnea with use of continuous positive airway pressure (CPAP) 10/27/2002   Thyroid  disease    Past Surgical History:  Procedure Laterality Date   CARDIAC CATHETERIZATION     CATARACT EXTRACTION W/ INTRAOCULAR LENS IMPLANT Bilateral    FOOT SURGERY     fused arch in left foot   GROIN DISSECTION  08/06/2021   Procedure: Arnetha Bhat EXPLORATION ;  Surgeon: Adine Hoof, MD;  Location: [facility] OR;  Service: Vascular;;   INTRAOPERATIVE TRANSTHORACIC ECHOCARDIOGRAM N/A 08/06/2021   Procedure: INTRAOPERATIVE TRANSTHORACIC ECHOCARDIOGRAM;  Surgeon: Kyra Phy, MD;  Location: [facility] OR;  Service: Cardiovascular;  Laterality: N/A;   LOWER EXTREMITY ANGIOGRAM  08/06/2021   Procedure: LEFT LOWER EXTREMITY ANGIOGRAM;  Surgeon: Kyra Phy, MD;  Location: [facility] OR;  Service: Cardiovascular;;   PATCH ANGIOPLASTY  08/06/2021   Procedure: PATCH ANGIOPLASTY OF FEMORAL  ARTERY USING LEFT GREATER SAPHENOUS VEIN;  Surgeon: Adine Hoof, MD;  Location: [facility] OR;  Service: Vascular;;   RIGHT/LEFT HEART CATH AND CORONARY ANGIOGRAPHY N/A 04/16/2021   Procedure: RIGHT/LEFT HEART CATH AND CORONARY ANGIOGRAPHY;  Surgeon: Knox Perl, MD;  Location: [facility] INVASIVE CV LAB;  Service: Cardiovascular;  Laterality: N/A;   SPINE SURGERY     TONSILLECTOMY     TOTAL KNEE ARTHROPLASTY Left    TRANSCATHETER AORTIC VALVE REPLACEMENT, TRANSFEMORAL N/A 08/06/2021   Procedure: TRANSCATHETER AORTIC VALVE REPLACEMENT, TRANSFEMORAL;  Surgeon: Kyra Phy, MD;  Location: [facility] OR;  Service: Cardiovascular;  Laterality: N/A;   Patient Active Problem List   Diagnosis Date Noted   Epiretinal membrane (ERM) of right eye 08/28/2021   Fuchs' corneal dystrophy 08/28/2021   Peripheral vascular disease (HCC) 08/28/2021   Cataract, bilateral 08/28/2021   Unspecified atherosclerosis of native arteries of extremities, bilateral legs (HCC) 08/28/2021   S/P TAVR (transcatheter aortic valve replacement) 08/06/2021   Asymptomatic bilateral carotid artery stenosis 05/09/2021   Severe aortic stenosis 04/16/2021   Polycythemia vera (HCC) 02/14/2021   Osteopenia 01/02/2021   Pain in right knee 07/04/2020   Hypothyroidism due to acquired atrophy of thyroid  05/12/2018   Dizziness and giddiness 05/12/2018   Closed fracture of proximal end of left humerus 02/25/2018   Chronic pain syndrome 07/08/2017   Psoriatic arthritis, 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/08/2017  Hyperlipidemia 07/14/2016   Dermatochalasis of both upper eyelids 08/31/2014   Left foot pain 10/04/2012   Lumbosacral spondylosis without myelopathy 08/07/2011   Arthropathy of cervical spine 07/19/2011   Degenerative joint disease of cervical spine 07/19/2011   Cellulitis and abscess 06/20/2010   Hypothyroidism 06/20/2010   Psoriasis with arthropathy (HCC) 06/20/2010   OSA treated with BiPAP 06/20/2010   Sialoadenitis 03/04/2007     PCP: Verma Gobble, NP  REFERRING PROVIDER: Verma Gobble, NP  REFERRING DIAG: Diagnosis R29.898 (ICD-10-CM) - Bilateral leg weakness M15.0 (ICD-10-CM) - Primary osteoarthritis involving multiple joints  THERAPY DIAG:  Unsteadiness on feet  Muscle weakness (generalized)  Dizziness and giddiness  Other abnormalities of gait and mobility  Rationale for Evaluation and Treatment: Rehabilitation  ONSET DATE: chronic   SUBJECTIVE:   SUBJECTIVE STATEMENT: Relays she gets dizzy with any change of positions or turning her head. Pain is also bad today as she says her arthritis is flared up  PERTINENT HISTORY: See above  PAIN:  Are you having pain? YES 6/10 all over  PRECAUTIONS: Fall and Other: hx destructive psoriatic arthritis    RED FLAGS: None   WEIGHT BEARING RESTRICTIONS: No  FALLS:  Has patient fallen in last 6 months? Yes. 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    PATIENT SURVEYS:  FOTO 41, predicted 47 in 13 visits   COGNITION: Overall cognitive status: Within functional limits for tasks assessed     SENSATION: WFL    LOWER EXTREMITY MMT:  MMT Right eval Left eval  Hip flexion 4+ 3+  Hip extension    Hip abduction 4+ 3  Hip adduction    Hip internal rotation    Hip external rotation    Knee flexion 4+ 4  Knee extension 5 4+  Ankle  dorsiflexion 4+ 4+  Ankle plantarflexion    Ankle inversion 5 5  Ankle eversion 5 5   (Blank rows = not tested)    FUNCTIONAL TESTS:  3 minute walk test: TBD Berg Balance Scale: 38/56                                                                                                                                  TREATMENT DATE:  12/07/23 BBPV testing negative for right ear and left ear Horizontal canal testing negative NMR  Standing Gaze stabilization horizontal head turns 30 sec X 2 Standing gaze stabilization head nods 30 sec X 3 Modified 1/2 tandem balance 30 sec X 3 bilat with close supervision Balance feet apart and eyes closed 10 sec X 5 with close supervision  Therex:  Nu step LE only as she says she can't grip the arm handles. L4 X 8 min  HEP update and review    11/20/23  Eval, care planning, HEP  Bridges x5 Clams L LE x10 red TB STS red TB around knees x3 Discussed/demonstrated narrow BOS with chair    PATIENT EDUCATION:  Education details: exam findings, POC, HEP, plan to assess and tx vertigo during POC, assured her that L sided weakness is not necessarily indicative of serious neuro condition like CVA or TIA  Person educated: Patient Education method: Explanation, Demonstration, and Handouts Education comprehension: verbalized understanding, returned demonstration, and needs further education  HOME EXERCISE PROGRAM: Access Code: [email or web address] URL: [email or web address] Date: 12/07/2023 Prepared by: Jamee Mazzoni  Exercises - Supine Bridge  - 1 x daily - 7 x weekly - 2 sets - 10 reps - 1 seconds  hold - Clamshell with Resistance  - 1 x daily - 7 x weekly - 2 sets - 10 reps - 1 second  hold - Sit to Stand with Resistance Around Legs  - 1 x daily - 7 x weekly - 1 sets - 10 reps - Standing Narrow Base of Support with Chair  - 1 x daily - 7 x weekly - 2 sets - 5 reps - 10-20 seconds  hold - Standing Gaze Stabilization with Head Rotation  - 2 x  daily - 6 x weekly - 1 sets - 2 reps - 30 sec hold - Standing Gaze Stabilization with Head Nod  - 2 x daily - 6 x weekly - 1 sets - 2 reps - 30 hold - Standing Romberg to 1/2 Tandem Stance  - 2 x daily - 6 x weekly - 1 sets - 2 reps - 30 sec hold - Standing Balance in Corner with Eyes Closed  - 2 x daily - 6 x weekly - 1 sets - 5 reps - 10 sec hold  ASSESSMENT:  CLINICAL IMPRESSION: Positional testing negative today for BPPV or horizontal canal. She may have orthostatic hypotension but relays this is chronic issue and has been checked in the past and that she thinks it is more of inner ear or neck related. I did give her gaze stabilization and more balance work to focus on with HEP as well and  she shows good understanding of these.  OBJECTIVE IMPAIRMENTS: Abnormal gait, decreased activity tolerance, decreased balance, decreased knowledge of use of DME, decreased mobility, difficulty walking, decreased strength, and postural dysfunction.   ACTIVITY LIMITATIONS: standing, transfers, and locomotion level  PARTICIPATION LIMITATIONS: driving, shopping, and community activity  PERSONAL FACTORS: Age, Behavior pattern, Fitness, Past/current experiences, Social background, and Time since onset of injury/illness/exacerbation are also affecting patient's functional outcome.   REHAB POTENTIAL: Fair chronic destructive psoriatic arthritis  CLINICAL DECISION MAKING: Evolving/moderate complexity  EVALUATION COMPLEXITY: Moderate   GOALS: Goals reviewed with patient? No  SHORT TERM GOALS: Target date: 12/11/2023   Will be compliant with appropriate progressive HEP  Baseline: Goal status: INITIAL  2.  Vestibular w/u to have been completed/exercises assigned  Baseline:  Goal status: INITIAL    LONG TERM GOALS: Target date: 01/01/2024    MMT to have improved by at least one grade in all weak groups  Baseline:  Goal status: INITIAL  2.  Will score at least 48 on the Berg to show improved  balance skills  Baseline:  Goal status: INITIAL  3.  Education to be provided on appropriate local group exercise classes to assist her in being more active on an independent basis  Baseline:  Goal status: INITIAL  4.  Will be able to ascend/descent steps with U UE support and minimal unsteadiness  Baseline:  Goal status: INITIAL  5.  FOTO score to be at predicted value by time of DC  Baseline:  Goal status: INITIAL     PLAN:  PT FREQUENCY: 2x/week  PT DURATION: 6 weeks  PLANNED INTERVENTIONS: 97164- PT Re-evaluation, 97110-Therapeutic exercises, 97530- Therapeutic activity, [id number]- Neuromuscular re-education, [phone]- Self Care, 60454- Gait training, [phone]- Aquatic Therapy, and Balance training  PLAN FOR NEXT SESSION: Vestibular training, General strengthening and balance training otherwise  Jamee Mazzoni, PT, DPT 12/07/23 3:14 PM

## 2023-12-08 ENCOUNTER — Ambulatory Visit: Payer: Medicare Other | Attending: Cardiology | Admitting: Cardiology

## 2023-12-08 ENCOUNTER — Encounter: Payer: Self-pay | Admitting: Cardiology

## 2023-12-08 VITALS — BP 138/80 | HR 81 | Resp 16 | Ht 64.0 in | Wt 142.2 lb

## 2023-12-08 DIAGNOSIS — T466X5A Adverse effect of antihyperlipidemic and antiarteriosclerotic drugs, initial encounter: Secondary | ICD-10-CM | POA: Diagnosis not present

## 2023-12-08 DIAGNOSIS — Z952 Presence of prosthetic heart valve: Secondary | ICD-10-CM | POA: Insufficient documentation

## 2023-12-08 DIAGNOSIS — G720 Drug-induced myopathy: Secondary | ICD-10-CM | POA: Insufficient documentation

## 2023-12-08 DIAGNOSIS — I10 Essential (primary) hypertension: Secondary | ICD-10-CM | POA: Insufficient documentation

## 2023-12-08 DIAGNOSIS — E7800 Pure hypercholesterolemia, unspecified: Secondary | ICD-10-CM | POA: Insufficient documentation

## 2023-12-08 DIAGNOSIS — T466X5D Adverse effect of antihyperlipidemic and antiarteriosclerotic drugs, subsequent encounter: Secondary | ICD-10-CM | POA: Diagnosis not present

## 2023-12-08 DIAGNOSIS — I6523 Occlusion and stenosis of bilateral carotid arteries: Secondary | ICD-10-CM | POA: Diagnosis not present

## 2023-12-08 NOTE — Patient Instructions (Signed)
Medication Instructions:  Your physician has recommended you make the following change in your medication: Resume Pravastatin 10 mg alternating with 20 mg every other day  *If you need a refill on your cardiac medications before your next appointment, please call your pharmacy*   Lab Work: none If you have labs (blood work) drawn today and your tests are completely normal, you will receive your results only by: MyChart Message (if you have MyChart) OR A paper copy in the mail If you have any lab test that is abnormal or we need to change your treatment, we will call you to review the results.   Testing/Procedures: Your physician has requested that you have a carotid duplex in one year. This test is an ultrasound of the carotid arteries in your neck. It looks at blood flow through these arteries that supply the brain with blood. Allow one hour for this exam. There are no restrictions or special instructions.    Follow-Up: At [facility], you and your health needs are our priority.  As part of our continuing mission to provide you with exceptional heart care, we have created designated Provider Care Teams.  These Care Teams include your primary Cardiologist (physician) and Advanced Practice Providers (APPs -  Physician Assistants and Nurse Practitioners) who all work together to provide you with the care you need, when you need it.  We recommend signing up for the patient portal called "MyChart".  Sign up information is provided on this After Visit Summary.  MyChart is used to connect with patients for Virtual Visits (Telemedicine).  Patients are able to view lab/test results, encounter notes, upcoming appointments, etc.  Non-urgent messages can be sent to your provider as well.   To learn more about what you can do with MyChart, go to [email or web address].    Your next appointment:   12 month(s)  Provider:   Yates Decamp, MD     Other Instructions

## 2023-12-08 NOTE — Progress Notes (Signed)
Cardiology Office Note:  .   Date:  12/08/2023  ID:  Sydney Reid, DOB Nov 15, 1940, MRN [id number] PCP: [name], NP  [facility] Providers Cardiologist:  Yates Decamp, MD   History of Present Illness: .   Sydney Reid is a 83 y.o.  female with a hx of psoriatic arthritis, OSA on CPAP, HLD, CAD, polycythemia vera and aortic stenosis s/p TAVR (08/06/21) 23 mm by placement of Edwards CPN valve who presents to clinic for follow up.  She also has asymptomatic stenosis and CX by angiography treated medically.  Presently asymptomatic except for her arthritis getting worse over time.  She is still grieving her husband's death 6 months ago.  Discussed the use of AI scribe software for clinical note transcription with the patient, who gave verbal consent to proceed.  History of Present Illness   The patient, with a history of carotid artery disease and hyperlipidemia, presents with worsening arthritis symptoms, particularly in the knees. She describes the pain as 'horrible' and notes that the knees are hot and swollen. The patient attributes the worsening symptoms to recent weather changes and stress. She also reports chronic dizziness, particularly when changing positions, which has been ongoing for years. She is currently undergoing therapy for instability related to this issue. The patient is also grieving the loss of her spouse, which has added to her stress levels. Despite this, she maintains an active social life, regularly meeting friends for lunch and volunteering at her church. She acknowledges a need for more physical exercise and plans to consider this as the weather improves.      Labs   Lab Results  Component Value Date   CHOL 227 (H) 10/12/2023   HDL 53 10/12/2023   LDLCALC 136 (H) 10/12/2023   TRIG 233 (H) 10/12/2023   CHOLHDL 4.3 10/12/2023   Lab Results  Component Value Date   NA 141 10/12/2023   K 4.4 10/12/2023   CO2 26 10/12/2023   GLUCOSE 102  10/12/2023   BUN 15 10/12/2023   CREATININE 0.91 10/12/2023   CALCIUM 10.0 10/12/2023   EGFR 63 10/12/2023   GFRNONAA 49 (L) 02/17/2023      Latest Ref Rng & Units 10/12/2023   11:11 AM 02/17/2023   10:23 AM 11/18/2022   10:16 AM  BMP  Glucose 65 - 139 mg/dL 952  91  84   BUN 7 - 25 mg/dL 15  12  19    Creatinine 0.60 - 0.95 mg/dL 8.41  3.24  4.01   BUN/Creat Ratio 6 - 22 (calc) SEE NOTE:     Sodium 135 - 146 mmol/L 141  140  141   Potassium 3.5 - 5.3 mmol/L 4.4  4.3  4.3   Chloride 98 - 110 mmol/L 105  105  107   CO2 20 - 32 mmol/L 26  28  27    Calcium 8.6 - 10.4 mg/dL 02.7  9.9  9.6       Latest Ref Rng & Units 10/12/2023   11:11 AM 05/20/2023   10:56 AM 02/17/2023   10:23 AM  CBC  WBC 3.8 - 10.8 Thousand/uL 7.7  6.5  6.1   Hemoglobin 11.7 - 15.5 g/dL 25.3  66.4  40.3   Hematocrit 35.0 - 45.0 % 45.7  46.8  46.4   Platelets 140 - 400 Thousand/uL 278  211  222    Review of Systems  Cardiovascular:  Negative for chest pain, dyspnea on exertion and leg swelling.  Neurological:  Positive for vertigo.   Physical Exam:   VS:  BP 138/80 (BP Location: Left Arm, Patient Position: Sitting, Cuff Size: Normal)   Pulse 81   Resp 16   Ht 5\' 4"  (1.626 m)   Wt 142 lb 3.2 oz (64.5 kg)   SpO2 97%   BMI 24.41 kg/m    Wt Readings from Last 3 Encounters:  12/08/23 142 lb 3.2 oz (64.5 kg)  10/12/23 143 lb (64.9 kg)  08/19/23 140 lb (63.5 kg)    Physical Exam Neck:     Vascular: No JVD.  Cardiovascular:     Rate and Rhythm: Normal rate and regular rhythm.     Pulses: Intact distal pulses.     Heart sounds: S1 normal and S2 normal. Murmur heard.     Early systolic murmur is present with a grade of 2/6 at the upper right sternal border.     No gallop.  Pulmonary:     Effort: Pulmonary effort is normal.     Breath sounds: Normal breath sounds.  Abdominal:     General: Bowel sounds are normal.     Palpations: Abdomen is soft.  Musculoskeletal:     Right lower leg: No edema.      Left lower leg: No edema.    Studies Reviewed: Marland Kitchen    ECHOCARDIOGRAM COMPLETE 08/19/2023  1. Left ventricular ejection fraction, by estimation, is 65 to 70%. Left ventricular ejection fraction by PLAX is 69 %. The left ventricle has normal function. The left ventricle has no regional wall motion abnormalities. Left ventricular diastolic parameters are consistent with Grade I diastolic dysfunction (impaired relaxation). 2. Right ventricular systolic function is normal. The right ventricular size is normal. There is normal pulmonary artery systolic pressure. The estimated right ventricular systolic pressure is 16.0 mmHg. 3. The mitral valve is grossly normal. No evidence of mitral valve regurgitation. 4. The aortic valve has been repaired/replaced. Aortic valve regurgitation is not visualized. There is a 23 mm Sapien prosthetic (TAVR) valve present in the aortic position. Aortic valve mean gradient measures 9.0 mmHg. Peak gradient 18.1 mmHg, DI is 0.33.  Carotid artery duplex 11/19/2023: Right Carotid: Velocities in the right ICA are consistent with a 40-59%  stenosis.  Left Carotid: Velocities in the left ICA are consistent with a 1-39%  Vertebrals: Bilateral vertebral arteries demonstrate antegrade flow.  Subclavians: Left subclavian artery flow was disturbed. Normal flow  hemodynamics  were seen in the right subclavian artery.   EKG:    EKG Interpretation Date/Time:  Tuesday December 08 2023 10:12:36 EST Ventricular Rate:  81 PR Interval:  174 QRS Duration:  68 QT Interval:  362 QTC Calculation: 420 R Axis:   -19  Text Interpretation: EKG 12/08/2023: Normal sinus rhythm at the rate of 81 bpm, cannot exclude inferior infarct old.  Poor R progression, cannot exclude anteroseptal infarct old.  Compared to 08/07/2021, poor R wave progression is new.  However no change from 08/02/2021.  Suspect normal variant. Confirmed by Delrae Rend [phone]) on 12/08/2023 10:27:42 AM    EKG 11/06/2022:  Normal sinus rhythm at rate of 93 bpm, left radial enlargement, normal axis. No evidence of ischemia, normal EKG. Single PAC.   Medications and allergies    Allergies  Allergen Reactions   Codeine Nausea And Vomiting    Weakness and dysequilibrium   Statins Hives    Myalgias      Current Outpatient Medications:    aspirin EC 81 MG tablet, Take 81 mg  by mouth daily. Swallow whole., Disp: , Rfl:    Cholecalciferol (VITAMIN D3) 250 MCG (10000 UT) capsule, Take 4,000 Units by mouth daily., Disp: , Rfl:    etanercept (ENBREL) 50 MG/ML injection, Inject 50 mg into the skin every Sunday. , Disp: , Rfl:    ezetimibe (ZETIA) 10 MG tablet, TAKE 1 TABLET BY MOUTH DAILY, Disp: 90 tablet, Rfl: 3   fluocinonide ointment (LIDEX) 0.05 %, Apply 1 Application topically daily as needed., Disp: , Rfl:    HYDROcodone-acetaminophen (NORCO/VICODIN) 5-325 MG tablet, Take 1 tablet by mouth daily., Disp: 30 tablet, Rfl: 0   levothyroxine (SYNTHROID) 75 MCG tablet, TAKE 1 TABLET BY MOUTH DAILY  BEFORE BREAKFAST, Disp: 90 tablet, Rfl: 3   Multiple Vitamins-Minerals (MULTIVITAMIN ADULTS 50+) TABS, Take by mouth daily., Disp: , Rfl:    pravastatin (PRAVACHOL) 10 MG tablet, Take 1 tablet (10 mg total) by mouth as directed. 1 tablet and 2 tablets every other day alternating, Disp: , Rfl:    ASSESSMENT AND PLAN: .      ICD-10-CM   1. Asymptomatic bilateral carotid artery stenosis  I65.23 EKG 12-Lead    pravastatin (PRAVACHOL) 10 MG tablet    VAS US CAROTID    2. S/P TAVR (transcatheter aortic valve replacement)  Z95.2     3. Essential hypertension  I10     4. Hypercholesteremia  E78.00 pravastatin (PRAVACHOL) 10 MG tablet    5. Statin myopathy  G72.0    T46.6X5A      Assessment and Plan     Carotid Artery Disease There is mild to moderate stenosis in the right carotid artery, as shown in a previous carotid artery duplex. Continue taking daily aspirin. Order a carotid artery duplex in one year.  SP  TAVR No change in auscultation, she remains asymptomatic.  Echocardiogram from 08/19/2023 reviewed, no need for additional echocardiogram at this time.  Endocarditis prophylaxis indicated.  Hyperlipidemia Cholesterol levels have recently increased. There is intolerance to statins, with symptoms of itching and muscle pain. Currently taking Zetia. Plan to restart pravastatin at a low dose, alternating 10 mg once daily with 20 mg every other day. Monitor for side effects. Discussed stress-induced itching and encouraged dietary modifications to reduce cholesterol intake.  Osteoarthritis Chronic osteoarthritis has recently worsened in the knee, which is swollen, hot, and painful, likely due to weather changes and stress. Consider anti-inflammatory medications if pain persists.  Dizziness Noncardiac etiology related to position.  Chronic dizziness worsens with positional changes. An ENT evaluation ruled out positional vertigo. Therapy for instability, including maneuvers to reset crystals, is ongoing. Continue this therapy and report any changes in symptoms.  General Health Maintenance There is no current engagement in regular exercise. Encourage joining an exercise group or [employer] and promote regular physical activity.  Follow-up Schedule a follow-up visit in one year. Report any new symptoms or changes in existing conditions.   Signed,  Yates Decamp, MD, [facility] 12/08/2023, 12:40 PM [facility] [address] [place], [place] [address] Phone: [phone]. Fax:  [phone]

## 2023-12-09 ENCOUNTER — Encounter: Payer: Medicare Other | Admitting: Physical Therapy

## 2023-12-09 DIAGNOSIS — I739 Peripheral vascular disease, unspecified: Secondary | ICD-10-CM | POA: Diagnosis not present

## 2023-12-09 DIAGNOSIS — M792 Neuralgia and neuritis, unspecified: Secondary | ICD-10-CM | POA: Diagnosis not present

## 2023-12-09 DIAGNOSIS — L84 Corns and callosities: Secondary | ICD-10-CM | POA: Diagnosis not present

## 2023-12-09 DIAGNOSIS — L4052 Psoriatic arthritis mutilans: Secondary | ICD-10-CM | POA: Diagnosis not present

## 2023-12-09 DIAGNOSIS — B351 Tinea unguium: Secondary | ICD-10-CM | POA: Diagnosis not present

## 2023-12-09 DIAGNOSIS — L603 Nail dystrophy: Secondary | ICD-10-CM | POA: Diagnosis not present

## 2023-12-14 ENCOUNTER — Encounter: Payer: Self-pay | Admitting: Physical Therapy

## 2023-12-14 ENCOUNTER — Ambulatory Visit (INDEPENDENT_AMBULATORY_CARE_PROVIDER_SITE_OTHER): Payer: Medicare Other | Admitting: Physical Therapy

## 2023-12-14 DIAGNOSIS — R2681 Unsteadiness on feet: Secondary | ICD-10-CM

## 2023-12-14 DIAGNOSIS — M6281 Muscle weakness (generalized): Secondary | ICD-10-CM

## 2023-12-14 NOTE — Therapy (Signed)
OUTPATIENT PHYSICAL THERAPY TREATMENT   Patient Name: Sydney Reid MRN: [id number] DOB:07/12/1941, 83 y.o., female Today's Date: 12/14/2023  END OF SESSION:  PT End of Session - 12/14/23 1304     Visit Number 3    Number of Visits 13    Date for PT Re-Evaluation 01/01/24    Authorization Type MCR and BCBS    Authorization Time Period 11/20/23 to 01/01/24    Progress Note Due on Visit 10    PT Start Time 1300    PT Stop Time 1342    PT Time Calculation (min) 42 min    Activity Tolerance Patient tolerated treatment well    Behavior During Therapy [facility] for tasks assessed/performed              Past Medical History:  Diagnosis Date   Complication of anesthesia    Difficult intubation    20-25 years ago   H/O total knee replacement 10/28/1999   Right   High cholesterol    History of tonsillectomy and adenoidectomy 10/27/1944   History of transcatheter aortic valve replacement (TAVR) 08/06/2021   s/p Edwards 23mm S3 vis TF approach with Dr. Excell Seltzer and Dr. Laneta Simmers   Psoriatic arthritis (HCC) 10/27/1988   Sleep apnea with use of continuous positive airway pressure (CPAP) 10/27/2002   Thyroid disease    Past Surgical History:  Procedure Laterality Date   CARDIAC CATHETERIZATION     CATARACT EXTRACTION W/ INTRAOCULAR LENS IMPLANT Bilateral    FOOT SURGERY     fused arch in left foot   GROIN DISSECTION  08/06/2021   Procedure: Drucie Ip EXPLORATION ;  Surgeon: Maeola Harman, MD;  Location[facility] OR;  Service: Vascular;;   INTRAOPERATIVE TRANSTHORACIC ECHOCARDIOGRAM N/A 08/06/2021   Procedure: INTRAOPERATIVE TRANSTHORACIC ECHOCARDIOGRAM;  Surgeon: Orbie Pyo, MD;  Location: [facility] OR;  Service: Cardiovascular;  Laterality: N/A;   LOWER EXTREMITY ANGIOGRAM  08/06/2021   Procedure: LEFT LOWER EXTREMITY ANGIOGRAM;  Surgeon: Orbie Pyo, MD;  Location: [facility] OR;  Service: Cardiovascular;;   PATCH ANGIOPLASTY  08/06/2021   Procedure: PATCH ANGIOPLASTY OF FEMORAL  ARTERY USING LEFT GREATER SAPHENOUS VEIN;  Surgeon: Maeola Harman, MD;  Location: [facility] OR;  Service: Vascular;;   RIGHT/LEFT HEART CATH AND CORONARY ANGIOGRAPHY N/A 04/16/2021   Procedure: RIGHT/LEFT HEART CATH AND CORONARY ANGIOGRAPHY;  Surgeon: Yates Decamp, MD;  Location: [facility] INVASIVE CV LAB;  Service: Cardiovascular;  Laterality: N/A;   SPINE SURGERY     TONSILLECTOMY     TOTAL KNEE ARTHROPLASTY Left    TRANSCATHETER AORTIC VALVE REPLACEMENT, TRANSFEMORAL N/A 08/06/2021   Procedure: TRANSCATHETER AORTIC VALVE REPLACEMENT, TRANSFEMORAL;  Surgeon: Orbie Pyo, MD;  Location: [facility] OR;  Service: Cardiovascular;  Laterality: N/A;   Patient Active Problem List   Diagnosis Date Noted   Epiretinal membrane (ERM) of right eye 08/28/2021   Fuchs' corneal dystrophy 08/28/2021   Peripheral vascular disease (HCC) 08/28/2021   Cataract, bilateral 08/28/2021   Unspecified atherosclerosis of native arteries of extremities, bilateral legs (HCC) 08/28/2021   S/P TAVR (transcatheter aortic valve replacement) 08/06/2021   Asymptomatic bilateral carotid artery stenosis 05/09/2021   Severe aortic stenosis 04/16/2021   Polycythemia vera (HCC) 02/14/2021   Osteopenia 01/02/2021   Pain in right knee 07/04/2020   Hypothyroidism due to acquired atrophy of thyroid 05/12/2018   Dizziness and giddiness 05/12/2018   Closed fracture of proximal end of left humerus 02/25/2018   Chronic pain syndrome 07/08/2017   Psoriatic arthritis, 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/08/2017  Hyperlipidemia 07/14/2016   Dermatochalasis of both upper eyelids 08/31/2014   Left foot pain 10/04/2012   Lumbosacral spondylosis without myelopathy 08/07/2011   Arthropathy of cervical spine 07/19/2011   Degenerative joint disease of cervical spine 07/19/2011   Cellulitis and abscess 06/20/2010   Hypothyroidism 06/20/2010   Psoriasis with arthropathy (HCC) 06/20/2010   OSA treated with BiPAP 06/20/2010   Sialoadenitis 03/04/2007     PCP: [name], NP  REFERRING PROVIDER: [name], NP  REFERRING DIAG: Diagnosis R29.898 (ICD-10-CM) - Bilateral leg weakness M15.0 (ICD-10-CM) - Primary osteoarthritis involving multiple joints  THERAPY DIAG:  Unsteadiness on feet  Muscle weakness (generalized)  Rationale for Evaluation and Treatment: Rehabilitation  ONSET DATE: chronic   SUBJECTIVE:   SUBJECTIVE STATEMENT: Dizziness is getting worse, not better.  Described as lightheaded.  Position changes and quick turns aggravate it.     PERTINENT HISTORY: See above  PAIN:  Are you having pain? YES 6/10 all over  PRECAUTIONS: Fall and Other: hx destructive psoriatic arthritis    RED FLAGS: None   WEIGHT BEARING RESTRICTIONS: No  FALLS:  Has patient fallen in last 6 months? Yes. Number of falls 1- tripped on step in home; no FOF   LIVING ENVIRONMENT: Lives with: lives alone Lives in: Other townhouse  Stairs: 5 STE in the front, 3 STE from garage; second floor with rails; also has one step from main level to sunroom  Has following equipment at home: Dan Humphreys - 2 wheeled  OCCUPATION: retired- used to work in Engineer, agricultural with [employer]    PLOF: Independent, Independent with basic ADLs, Independent with gait, and Independent with transfers  PATIENT GOALS: improve balance  NEXT MD VISIT: Referring October 2025  OBJECTIVE:  Note: Objective measures were completed at Evaluation unless otherwise noted.    PATIENT SURVEYS:  FOTO 41, predicted 47 in 13 visits   COGNITION: Overall cognitive status: Within functional limits for tasks assessed     SENSATION: WFL    LOWER EXTREMITY MMT:  MMT Right eval Left eval  Hip flexion 4+ 3+  Hip extension    Hip abduction 4+ 3  Hip adduction    Hip internal rotation    Hip external rotation    Knee flexion 4+ 4  Knee extension 5 4+  Ankle dorsiflexion 4+ 4+  Ankle plantarflexion    Ankle inversion 5 5  Ankle eversion 5 5    (Blank rows = not tested)    FUNCTIONAL TESTS:  3 minute walk test: TBD Berg Balance Scale: 38/56                                                                                                                                  TREATMENT DATE:  12/14/23 Self Care Discussed/review of symptoms and prior imaging.  For fullness discussed decongestant options.  Feel she may benefit from further imagine to look at blood flow as well as  possibly see ENT to due to prior Lt ear injury  BP supine x 5 min: 136/91 HR 98; BP standing x 1 min: 126/88 HR 112; BP standing x 3 min: 144/98 HR 106  Neuro Re-Ed Rotary chair test negative Discussed current HEP as well as habituation - pt verbalized compliance and understanding of habituation exercises but at this time symptoms seem to be getting worse  TherEx Seated LAQ 2x10 bil; 4# Seated marching 2x10 bil; 4# Seated hip abduction 2x10; L3 band without foot support Seated trunk rotation with 4# weight outstretched, light foot support x 20 bil  12/07/23 BBPV testing negative for right ear and left ear Horizontal canal testing negative NMR Standing Gaze stabilization horizontal head turns 30 sec X 2 Standing gaze stabilization head nods 30 sec X 3 Modified 1/2 tandem balance 30 sec X 3 bilat with close supervision Balance feet apart and eyes closed 10 sec X 5 with close supervision  Therex:  Nu step LE only as she says she can't grip the arm handles. L4 X 8 min  HEP update and review    11/20/23  Eval, care planning, HEP  Bridges x5 Clams L LE x10 red TB STS red TB around knees x3 Discussed/demonstrated narrow BOS with chair    PATIENT EDUCATION:  Education details: exam findings, POC, HEP, plan to assess and tx vertigo during POC, assured her that L sided weakness is not necessarily indicative of serious neuro condition like CVA or TIA  Person educated: Patient Education method: Explanation, Demonstration, and Handouts Education  comprehension: verbalized understanding, returned demonstration, and needs further education  HOME EXERCISE PROGRAM: Access Code: [email or web address] URL: [email or web address] Date: 12/07/2023 Prepared by: Ivery Quale  Exercises - Supine Bridge  - 1 x daily - 7 x weekly - 2 sets - 10 reps - 1 seconds  hold - Clamshell with Resistance  - 1 x daily - 7 x weekly - 2 sets - 10 reps - 1 second  hold - Sit to Stand with Resistance Around Legs  - 1 x daily - 7 x weekly - 1 sets - 10 reps - Standing Narrow Base of Support with Chair  - 1 x daily - 7 x weekly - 2 sets - 5 reps - 10-20 seconds  hold - Standing Gaze Stabilization with Head Rotation  - 2 x daily - 6 x weekly - 1 sets - 2 reps - 30 sec hold - Standing Gaze Stabilization with Head Nod  - 2 x daily - 6 x weekly - 1 sets - 2 reps - 30 hold - Standing Romberg to 1/2 Tandem Stance  - 2 x daily - 6 x weekly - 1 sets - 2 reps - 30 sec hold - Standing Balance in Corner with Eyes Closed  - 2 x daily - 6 x weekly - 1 sets - 5 reps - 10 sec hold  ASSESSMENT:  CLINICAL IMPRESSION: Pt continues to report symptoms of dizziness described as lightheaded and imbalance with occasional spontaneous spinning that she feels has gotten worse since starting PT.  Did recommend trial of decongestants but feel she may need additional follow up to further investigate symptoms.  Remainder of session working on strengthening without aggravating symptoms.  Will continue to benefit from PT to maximize function.   OBJECTIVE IMPAIRMENTS: Abnormal gait, decreased activity tolerance, decreased balance, decreased knowledge of use of DME, decreased mobility, difficulty walking, decreased strength, and postural dysfunction.   ACTIVITY LIMITATIONS: standing, transfers, and locomotion level  PARTICIPATION LIMITATIONS:  driving, shopping, and community activity  PERSONAL FACTORS: Age, Behavior pattern, Fitness, Past/current experiences, Social background, and Time since  onset of injury/illness/exacerbation are also affecting patient's functional outcome.   REHAB POTENTIAL: Fair chronic destructive psoriatic arthritis  CLINICAL DECISION MAKING: Evolving/moderate complexity  EVALUATION COMPLEXITY: Moderate   GOALS: Goals reviewed with patient? No  SHORT TERM GOALS: Target date: 12/11/2023   Will be compliant with appropriate progressive HEP  Baseline: Goal status: INITIAL  2.  Vestibular w/u to have been completed/exercises assigned  Baseline:  Goal status: INITIAL    LONG TERM GOALS: Target date: 01/01/2024    MMT to have improved by at least one grade in all weak groups  Baseline:  Goal status: INITIAL  2.  Will score at least 48 on the Berg to show improved balance skills  Baseline:  Goal status: INITIAL  3.  Education to be provided on appropriate local group exercise classes to assist her in being more active on an independent basis  Baseline:  Goal status: INITIAL  4.  Will be able to ascend/descent steps with U UE support and minimal unsteadiness  Baseline:  Goal status: INITIAL  5.  FOTO score to be at predicted value by time of DC  Baseline:  Goal status: INITIAL     PLAN:  PT FREQUENCY: 2x/week  PT DURATION: 6 weeks  PLANNED INTERVENTIONS: 97164- PT Re-evaluation, 97110-Therapeutic exercises, 97530- Therapeutic activity, [id number]- Neuromuscular re-education, [phone]- Self Care, 40981- Gait training, [phone]- Aquatic Therapy, and Balance training  PLAN FOR NEXT SESSION: General strengthening and balance training otherwise    Clarita Crane, PT, DPT 12/14/23 1:48 PM

## 2023-12-17 ENCOUNTER — Encounter: Payer: Medicare Other | Admitting: Physical Therapy

## 2023-12-17 DIAGNOSIS — M79641 Pain in right hand: Secondary | ICD-10-CM | POA: Diagnosis not present

## 2023-12-17 NOTE — Therapy (Deleted)
 OUTPATIENT PHYSICAL THERAPY TREATMENT   Patient Name: Sydney Reid MRN: [id number] DOB:02/14/1941, 83 y.o., female Today's Date: 12/17/2023  END OF SESSION:     Past Medical History:  Diagnosis Date   Complication of anesthesia    Difficult intubation    20-25 years ago   H/O total knee replacement 10/28/1999   Right   High cholesterol    History of tonsillectomy and adenoidectomy 10/27/1944   History of transcatheter aortic valve replacement (TAVR) 08/06/2021   s/p Edwards 23mm S3 vis TF approach with Dr. Excell Seltzer and Dr. Laneta Simmers   Psoriatic arthritis [facility]) 10/27/1988   Sleep apnea with use of continuous positive airway pressure (CPAP) 10/27/2002   Thyroid disease    Past Surgical History:  Procedure Laterality Date   CARDIAC CATHETERIZATION     CATARACT EXTRACTION W/ INTRAOCULAR LENS IMPLANT Bilateral    FOOT SURGERY     fused arch in left foot   GROIN DISSECTION  08/06/2021   Procedure: Drucie Ip EXPLORATION ;  Surgeon: Maeola Harman, MD;  Location: [facility] OR;  Service: Vascular;;   INTRAOPERATIVE TRANSTHORACIC ECHOCARDIOGRAM N/A 08/06/2021   Procedure: INTRAOPERATIVE TRANSTHORACIC ECHOCARDIOGRAM;  Surgeon: Orbie Pyo, MD;  Location: [facility] OR;  Service: Cardiovascular;  Laterality: N/A;   LOWER EXTREMITY ANGIOGRAM  08/06/2021   Procedure: LEFT LOWER EXTREMITY ANGIOGRAM;  Surgeon: Orbie Pyo, MD;  Location: [facility] OR;  Service: Cardiovascular;;   PATCH ANGIOPLASTY  08/06/2021   Procedure: PATCH ANGIOPLASTY OF FEMORAL ARTERY USING LEFT GREATER SAPHENOUS VEIN;  Surgeon: Maeola Harman, MD;  Location: [facility] OR;  Service: Vascular;;   RIGHT/LEFT HEART CATH AND CORONARY ANGIOGRAPHY N/A 04/16/2021   Procedure: RIGHT/LEFT HEART CATH AND CORONARY ANGIOGRAPHY;  Surgeon: Yates Decamp, MD;  Location: [facility] INVASIVE CV LAB;  Service: Cardiovascular;  Laterality: N/A;   SPINE SURGERY     TONSILLECTOMY     TOTAL KNEE ARTHROPLASTY Left    TRANSCATHETER AORTIC VALVE  REPLACEMENT, TRANSFEMORAL N/A 08/06/2021   Procedure: TRANSCATHETER AORTIC VALVE REPLACEMENT, TRANSFEMORAL;  Surgeon: Orbie Pyo, MD;  Location: [facility] OR;  Service: Cardiovascular;  Laterality: N/A;   Patient Active Problem List   Diagnosis Date Noted   Epiretinal membrane (ERM) of right eye 08/28/2021   Fuchs' corneal dystrophy 08/28/2021   Peripheral vascular disease (HCC) 08/28/2021   Cataract, bilateral 08/28/2021   Unspecified atherosclerosis of native arteries of extremities, bilateral legs (HCC) 08/28/2021   S/P TAVR (transcatheter aortic valve replacement) 08/06/2021   Asymptomatic bilateral carotid artery stenosis 05/09/2021   Severe aortic stenosis 04/16/2021   Polycythemia vera (HCC) 02/14/2021   Osteopenia 01/02/2021   Pain in right knee 07/04/2020   Hypothyroidism due to acquired atrophy of thyroid 05/12/2018   Dizziness and giddiness 05/12/2018   Closed fracture of proximal end of left humerus 02/25/2018   Chronic pain syndrome 07/08/2017   Psoriatic arthritis, 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/08/2017   Hyperlipidemia 07/14/2016   Dermatochalasis of both upper eyelids 08/31/2014   Left foot pain 10/04/2012   Lumbosacral spondylosis without myelopathy 08/07/2011   Arthropathy of cervical spine 07/19/2011   Degenerative joint disease of cervical spine 07/19/2011   Cellulitis and abscess 06/20/2010   Hypothyroidism 06/20/2010   Psoriasis with arthropathy (HCC) 06/20/2010   OSA treated with BiPAP 06/20/2010   Sialoadenitis 03/04/2007    PCP: [name], NP  REFERRING PROVIDER: [name], NP  REFERRING DIAG: Diagnosis R29.898 (ICD-10-CM) - Bilateral leg weakness M15.0 (ICD-10-CM) - Primary osteoarthritis involving multiple joints  THERAPY DIAG:  No  diagnosis found.  Rationale for Evaluation and Treatment: Rehabilitation  ONSET DATE: chronic   SUBJECTIVE:   SUBJECTIVE STATEMENT: *** Dizziness is getting worse, not better.  Described as  lightheaded.  Position changes and quick turns aggravate it.     PERTINENT HISTORY: See above  PAIN:  Are you having pain? YES 6/10 all over  PRECAUTIONS: Fall and Other: hx destructive psoriatic arthritis    RED FLAGS: None   WEIGHT BEARING RESTRICTIONS: No  FALLS:  Has patient fallen in last 6 months? Yes. 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    PATIENT SURVEYS:  FOTO 41, predicted 47 in 13 visits   COGNITION: Overall cognitive status: Within functional limits for tasks assessed     SENSATION: WFL    LOWER EXTREMITY MMT:  MMT Right eval Left eval  Hip flexion 4+ 3+  Hip extension    Hip abduction 4+ 3  Hip adduction    Hip internal rotation    Hip external rotation    Knee flexion 4+ 4  Knee extension 5 4+  Ankle dorsiflexion 4+ 4+  Ankle plantarflexion    Ankle inversion 5 5  Ankle eversion 5 5   (Blank rows = not tested)    FUNCTIONAL TESTS:  3 minute walk test: TBD Berg Balance Scale: 38/56                                                                                                                                  TREATMENT DATE:  12/17/23 ***   12/14/23 Self Care Discussed/review of symptoms and prior imaging.  For fullness discussed decongestant options.  Feel she may benefit from further imagine to look at blood flow as well as  possibly see ENT to due to prior Lt ear injury  BP supine x 5 min: 136/91 HR 98; BP standing x 1 min: 126/88 HR 112; BP standing x 3 min: 144/98 HR 106  Neuro Re-Ed Rotary chair test negative Discussed current HEP as well as habituation - pt verbalized compliance and understanding of habituation exercises but at this time symptoms seem to be getting worse  TherEx Seated LAQ 2x10 bil; 4# Seated marching 2x10 bil; 4# Seated hip abduction 2x10; L3 band without foot support Seated trunk rotation with 4# weight outstretched, light foot support x 20 bil  12/07/23  BBPV testing negative for right ear and left ear Horizontal canal testing negative NMR Standing Gaze stabilization horizontal head turns 30 sec X 2 Standing gaze stabilization head nods 30 sec X 3 Modified 1/2 tandem balance 30 sec X 3 bilat with close supervision Balance feet apart and eyes closed 10 sec X 5 with close supervision  Therex:  Nu step LE only as she says she can't grip the arm handles. L4 X 8 min  HEP update and review    11/20/23  Eval, care planning, HEP  Bridges x5 Clams L LE x10 red TB STS red TB around knees x3 Discussed/demonstrated narrow BOS with chair    PATIENT EDUCATION:  Education details: exam findings, POC, HEP, plan to assess and tx vertigo during POC, assured her that L sided weakness is not necessarily indicative of serious neuro condition like CVA or TIA  Person educated: Patient Education method: Explanation, Demonstration, and Handouts Education comprehension: verbalized understanding, returned demonstration, and needs further education  HOME EXERCISE PROGRAM: Access Code: [email or web address] URL: [email or web address] Date: 12/07/2023 Prepared by: Ivery Quale  Exercises - Supine Bridge  - 1 x daily - 7 x weekly - 2 sets - 10 reps - 1 seconds  hold - Clamshell with Resistance  - 1 x daily - 7 x weekly - 2 sets - 10 reps - 1 second  hold - Sit to Stand with Resistance Around  Legs  - 1 x daily - 7 x weekly - 1 sets - 10 reps - Standing Narrow Base of Support with Chair  - 1 x daily - 7 x weekly - 2 sets - 5 reps - 10-20 seconds  hold - Standing Gaze Stabilization with Head Rotation  - 2 x daily - 6 x weekly - 1 sets - 2 reps - 30 sec hold - Standing Gaze Stabilization with Head Nod  - 2 x daily - 6 x weekly - 1 sets - 2 reps - 30 hold - Standing Romberg to 1/2 Tandem Stance  - 2 x daily - 6 x weekly - 1 sets - 2 reps - 30 sec hold - Standing Balance in Corner with Eyes Closed  - 2 x daily - 6 x weekly - 1 sets - 5 reps - 10 sec hold  ASSESSMENT:  CLINICAL IMPRESSION: *** Pt continues to report symptoms of dizziness described as lightheaded and imbalance with occasional spontaneous spinning that she feels has gotten worse since starting PT.  Did recommend trial of decongestants but feel she may need additional follow up to further investigate symptoms.  Remainder of session working on strengthening without aggravating symptoms.  Will continue to benefit from PT to maximize function.   OBJECTIVE IMPAIRMENTS: Abnormal gait, decreased activity tolerance, decreased balance, decreased knowledge of use of DME, decreased mobility, difficulty walking, decreased strength, and postural dysfunction.   ACTIVITY LIMITATIONS: standing, transfers, and locomotion level  PARTICIPATION LIMITATIONS: driving, shopping, and community activity  PERSONAL FACTORS: Age, Behavior pattern, Fitness, Past/current experiences, Social background, and Time since onset of injury/illness/exacerbation are also affecting patient's functional outcome.   REHAB POTENTIAL: Fair chronic destructive psoriatic arthritis  CLINICAL DECISION MAKING: Evolving/moderate complexity  EVALUATION COMPLEXITY: Moderate   GOALS: Goals reviewed with patient? No  SHORT TERM GOALS: Target date: 12/11/2023   Will be compliant with appropriate progressive HEP  Baseline: Goal status: INITIAL  2.  Vestibular w/u to  have been completed/exercises assigned  Baseline:  Goal status: INITIAL    LONG TERM GOALS: Target date: 01/01/2024  MMT to have improved by at least one grade in all weak groups  Baseline:  Goal status: INITIAL  2.  Will score at least 48 on the Berg to show improved balance skills  Baseline:  Goal status: INITIAL  3.  Education to be provided on appropriate local group exercise classes to assist her in being more active on an independent basis  Baseline:  Goal status: INITIAL  4.  Will be able to ascend/descent steps with U UE support and minimal unsteadiness  Baseline:  Goal status: INITIAL  5.  FOTO score to be at predicted value by time of DC  Baseline:  Goal status: INITIAL     PLAN:  PT FREQUENCY: 2x/week  PT DURATION: 6 weeks  PLANNED INTERVENTIONS: 97164- PT Re-evaluation, 97110-Therapeutic exercises, 97530- Therapeutic activity, [id number]- Neuromuscular re-education, [phone]- Self Care, [id number]- Gait training, [phone]- Aquatic Therapy, and Balance training  PLAN FOR NEXT SESSION: General strengthening and balance training otherwise    [facility] April Ma L Brandyn Lowrey, PT DPT 12/17/23 10:03 AM

## 2023-12-21 ENCOUNTER — Encounter: Payer: Medicare Other | Admitting: Physical Therapy

## 2023-12-21 ENCOUNTER — Encounter: Payer: Self-pay | Admitting: Nurse Practitioner

## 2023-12-21 ENCOUNTER — Ambulatory Visit (INDEPENDENT_AMBULATORY_CARE_PROVIDER_SITE_OTHER): Payer: Medicare Other | Admitting: Nurse Practitioner

## 2023-12-21 VITALS — BP 121/78 | HR 77 | Temp 97.6°F | Resp 18 | Ht 64.0 in | Wt 141.0 lb

## 2023-12-21 DIAGNOSIS — J069 Acute upper respiratory infection, unspecified: Secondary | ICD-10-CM | POA: Diagnosis not present

## 2023-12-21 DIAGNOSIS — L4050 Arthropathic psoriasis, unspecified: Secondary | ICD-10-CM

## 2023-12-21 DIAGNOSIS — E034 Atrophy of thyroid (acquired): Secondary | ICD-10-CM

## 2023-12-21 LAB — POC COVID19 BINAXNOW: SARS Coronavirus 2 Ag: POSITIVE — AB

## 2023-12-21 MED ORDER — BENZONATATE 100 MG PO CAPS: 100.0000 mg | ORAL_CAPSULE | Freq: Three times a day (TID) | ORAL | 0 refills | Status: AC | PRN

## 2023-12-21 MED ORDER — NIRMATRELVIR/RITONAVIR (PAXLOVID)TABLET: 3.0000 | ORAL_TABLET | Freq: Two times a day (BID) | ORAL | 0 refills | Status: AC

## 2023-12-21 MED ORDER — PREDNISONE 20 MG PO TABS: 20.0000 mg | ORAL_TABLET | Freq: Every day | ORAL | 1 refills | Status: AC

## 2023-12-21 NOTE — Assessment & Plan Note (Addendum)
 COVID tested positive,  Flu no test kit available Tessalon 100mg  tid prn Prednisone 20mg  every day x7 days, may be repeat x1 Paxlovid

## 2023-12-21 NOTE — Progress Notes (Unsigned)
 Location:   clinic [facility]   Place of Service:    Provider: Chipper Oman NP  Code Status: DNR Goals of Care:     11/20/2023    1:02 PM  Advanced Directives  Does Patient Have a Medical Advance Directive? Yes  Type of Estate agent of [place];Living will  Does patient want to make changes to medical advance directive? No - Patient declined  Copy of Healthcare Power of Attorney in Chart? No - copy requested     Chief Complaint  Patient presents with  . URI    URI with cough     HPI: Patient is a 83 y.o. female seen today for an acute visit for Discussed the use of AI scribe software for clinical note transcription with the patient, who gave verbal consent to proceed.  History of Present Illness    Cough, congestion, trouble swallowing, chest pain when cough, stared Amoxicillin self(PNC from dentist), generalized weakness, poor appetite x 2-3 days. Tested positive COVID  Psoriatic arthritis, Enbrel, Norco, wbc 7/7 10/12/23  Hypothyroidism, on Levothyroxine, TSH 2.09 10/12/23  HLD LDL 136, Zetia, Pravastatin.           Past Medical History:  Diagnosis Date  . Complication of anesthesia   . Difficult intubation    20-25 years ago  . H/O total knee replacement 10/28/1999   Right  . High cholesterol   . History of tonsillectomy and adenoidectomy 10/27/1944  . History of transcatheter aortic valve replacement (TAVR) 08/06/2021   s/p Edwards 23mm S3 vis TF approach with Dr. Excell Seltzer and Dr. Laneta Simmers  . Psoriatic arthritis (HCC) 10/27/1988  . Sleep apnea with use of continuous positive airway pressure (CPAP) 10/27/2002  . Thyroid disease     Past Surgical History:  Procedure Laterality Date  . CARDIAC CATHETERIZATION    . CATARACT EXTRACTION W/ INTRAOCULAR LENS IMPLANT Bilateral   . FOOT SURGERY     fused arch in left foot  . GROIN DISSECTION  08/06/2021   Procedure: Drucie Ip EXPLORATION ;  Surgeon: Maeola Harman, MD;  Location: [facility] OR;   Service: Vascular;;  . INTRAOPERATIVE TRANSTHORACIC ECHOCARDIOGRAM N/A 08/06/2021   Procedure: INTRAOPERATIVE TRANSTHORACIC ECHOCARDIOGRAM;  Surgeon: Orbie Pyo, MD;  Location: [facility] OR;  Service: Cardiovascular;  Laterality: N/A;  . LOWER EXTREMITY ANGIOGRAM  08/06/2021   Procedure: LEFT LOWER EXTREMITY ANGIOGRAM;  Surgeon: Orbie Pyo, MD;  Location: [facility] OR;  Service: Cardiovascular;;  . PATCH ANGIOPLASTY  08/06/2021   Procedure: PATCH ANGIOPLASTY OF FEMORAL ARTERY USING LEFT GREATER SAPHENOUS VEIN;  Surgeon: Maeola Harman, MD;  Location: [facility] OR;  Service: Vascular;;  . RIGHT/LEFT HEART CATH AND CORONARY ANGIOGRAPHY N/A 04/16/2021   Procedure: RIGHT/LEFT HEART CATH AND CORONARY ANGIOGRAPHY;  Surgeon: Yates Decamp, MD;  Location: [facility] INVASIVE CV LAB;  Service: Cardiovascular;  Laterality: N/A;  . SPINE SURGERY    . TONSILLECTOMY    . TOTAL KNEE ARTHROPLASTY Left   . TRANSCATHETER AORTIC VALVE REPLACEMENT, TRANSFEMORAL N/A 08/06/2021   Procedure: TRANSCATHETER AORTIC VALVE REPLACEMENT, TRANSFEMORAL;  Surgeon: Orbie Pyo, MD;  Location: [facility] OR;  Service: Cardiovascular;  Laterality: N/A;    Allergies  Allergen Reactions  . Codeine Nausea And Vomiting    Weakness and dysequilibrium  . Statins Hives    Myalgias     Allergies as of 12/21/2023       Reactions   Codeine Nausea And Vomiting   Weakness and dysequilibrium   Statins Hives   Myalgias  Medication List        Accurate as of December 21, 2023 11:59 PM. If you have any questions, ask your nurse or doctor.          aspirin EC 81 MG tablet Take 81 mg by mouth daily. Swallow whole.   benzonatate 100 MG capsule Commonly known as: Tessalon Perles Take 1 capsule (100 mg total) by mouth 3 (three) times daily as needed for up to 7 days for cough. Started by: Nahiara Kretzschmar X Jontae Sonier   etanercept 50 MG/ML injection Commonly known as: ENBREL Inject 50 mg into the skin every Sunday.   ezetimibe 10 MG  tablet Commonly known as: ZETIA TAKE 1 TABLET BY MOUTH DAILY   fluocinonide ointment 0.05 % Commonly known as: LIDEX Apply 1 Application topically daily as needed.   HYDROcodone-acetaminophen 5-325 MG tablet Commonly known as: NORCO/VICODIN Take 1 tablet by mouth daily.   levothyroxine 75 MCG tablet Commonly known as: SYNTHROID TAKE 1 TABLET BY MOUTH DAILY  BEFORE BREAKFAST   Multivitamin Adults 50+ Tabs Take by mouth daily.   nirmatrelvir/ritonavir 20 x 150 MG & 10 x 100MG  Tabs Commonly known as: PAXLOVID Take 3 tablets by mouth 2 (two) times daily for 5 days. (Take nirmatrelvir 150 mg two tablets twice daily for 5 days and ritonavir 100 mg one tablet twice daily for 5 days) Patient GFR is 63 Started by: Zniya Cottone X Spencer Peterkin   pravastatin 10 MG tablet Commonly known as: PRAVACHOL Take 1 tablet (10 mg total) by mouth as directed. 1 tablet and 2 tablets every other day alternating   predniSONE 20 MG tablet Commonly known as: DELTASONE Take 1 tablet (20 mg total) by mouth daily with breakfast for 14 days. Started by: Fernanda Twaddell X Damarius Karnes   Vitamin D3 250 MCG (10000 UT) capsule Take 4,000 Units by mouth daily.        Review of Systems:  Review of Systems  Constitutional:  Positive for appetite change, fatigue and fever.  HENT:  Positive for congestion, rhinorrhea and trouble swallowing. Negative for sore throat.   Eyes:  Negative for visual disturbance.  Respiratory:  Positive for cough and shortness of breath. Negative for wheezing.   Cardiovascular:  Positive for chest pain. Negative for palpitations and leg swelling.  Gastrointestinal:  Negative for abdominal pain, diarrhea, nausea and vomiting.  Genitourinary:  Negative for frequency and urgency.  Musculoskeletal:  Positive for arthralgias.  Skin:  Negative for color change.  Neurological:  Negative for speech difficulty, weakness and headaches.  Psychiatric/Behavioral:  Negative for confusion and sleep disturbance. The patient is  not nervous/anxious.     Health Maintenance  Topic Date Due  . DTaP/Tdap/Td (2 - Td or Tdap) 10/28/2019  . DEXA SCAN  01/01/2023  . COVID-19 Vaccine (6 - 2024-25 season) 06/28/2023  . Medicare Annual Wellness (AWV)  08/16/2024  . Pneumonia Vaccine 90+ Years old  Completed  . INFLUENZA VACCINE  Completed  . Zoster Vaccines- Shingrix  Completed  . HPV VACCINES  Aged Out    Physical Exam: Vitals:   12/21/23 1600  BP: 121/78  Pulse: 77  Resp: 18  Temp: 97.6 F (36.4 C)  SpO2: 98%  Weight: 141 lb (64 kg)  Height: 5\' 4"  (1.626 m)   Body mass index is 24.2 kg/m. Physical Exam Vitals and nursing note reviewed.  Constitutional:      Comments: tired  HENT:     Head: Normocephalic and atraumatic.     Nose: Congestion and rhinorrhea present.  Mouth/Throat:     Mouth: Mucous membranes are dry.     Pharynx: Posterior oropharyngeal erythema present. No oropharyngeal exudate.  Eyes:     Extraocular Movements: Extraocular movements intact.     Conjunctiva/sclera: Conjunctivae normal.     Pupils: Pupils are equal, round, and reactive to light.  Cardiovascular:     Rate and Rhythm: Normal rate and regular rhythm.  Pulmonary:     Effort: Pulmonary effort is normal.     Breath sounds: No wheezing or rales.     Comments: Central congestive.  Abdominal:     General: Bowel sounds are normal.     Palpations: Abdomen is soft.     Tenderness: There is no abdominal tenderness.  Musculoskeletal:     Cervical back: Normal range of motion and neck supple.     Right lower leg: No edema.     Left lower leg: No edema.  Skin:    General: Skin is warm and dry.  Neurological:     General: No focal deficit present.     Mental Status: She is alert and oriented to person, place, and time. Mental status is at baseline.     Gait: Gait normal.  Psychiatric:        Mood and Affect: Mood normal.        Behavior: Behavior normal.        Thought Content: Thought content normal.         Judgment: Judgment normal.    Labs reviewed: Basic Metabolic Panel: Recent Labs    01/27/23 0950 02/17/23 1023 10/12/23 1111  NA  --  140 141  K  --  4.3 4.4  CL  --  105 105  CO2  --  28 26  GLUCOSE  --  91 102  BUN  --  12 15  CREATININE  --  1.12* 0.91  CALCIUM  --  9.9 10.0  TSH 1.23  --  2.09   Liver Function Tests: Recent Labs    02/17/23 1023 10/12/23 1111  AST 22 21  ALT 13 14  ALKPHOS 68  --   BILITOT 0.7 0.4  PROT 7.7 7.7  ALBUMIN 4.3  --    No results for input(s): "LIPASE", "AMYLASE" in the last 8760 hours. No results for input(s): "AMMONIA" in the last 8760 hours. CBC: Recent Labs    02/17/23 1023 05/20/23 1056 10/12/23 1111  WBC 6.1 6.5 7.7  NEUTROABS 3.3 4.0 5,090  HGB 14.8 14.9 14.4  HCT 46.4* 46.8* 45.7*  MCV 84.8 84.8 85.1  PLT 222 211 278   Lipid Panel: Recent Labs    10/12/23 1111  CHOL 227*  HDL 53  LDLCALC 136*  TRIG 233*  CHOLHDL 4.3   No results found for: "HGBA1C"  Procedures since last visit: No results found.  Assessment/Plan URI (upper respiratory infection) COVID tested positive,  Flu no test kit available Tessalon 100mg  tid prn Prednisone 20mg  every day x7 days, may be repeat x1 Paxlovid   Psoriasis with arthropathy (HCC)  Enbrel, Norco, wbc 7/7 10/12/23  Hypothyroidism due to acquired atrophy of thyroid  on Levothyroxine, TSH 2.09 10/12/23  Hyperlipidemia LDL 136, Zetia, Pravastatin.           Labs/tests ordered:  COVID test Next appt:  12/22/2023

## 2023-12-21 NOTE — Assessment & Plan Note (Signed)
 Enbrel, Norco, wbc 7/7 10/12/23

## 2023-12-21 NOTE — Assessment & Plan Note (Signed)
 on Levothyroxine, TSH 2.09 10/12/23

## 2023-12-21 NOTE — Assessment & Plan Note (Signed)
 LDL 136, Zetia, Pravastatin.

## 2023-12-22 ENCOUNTER — Ambulatory Visit: Payer: Medicare Other | Admitting: Family

## 2023-12-23 NOTE — Progress Notes (Signed)
   This encounter was created in error - please disregard. No show

## 2023-12-24 ENCOUNTER — Encounter: Payer: Medicare Other | Admitting: Physical Therapy

## 2024-01-04 ENCOUNTER — Ambulatory Visit: Payer: Self-pay | Admitting: Nurse Practitioner

## 2024-01-04 ENCOUNTER — Ambulatory Visit (INDEPENDENT_AMBULATORY_CARE_PROVIDER_SITE_OTHER): Payer: Medicare Other | Admitting: Physical Therapy

## 2024-01-04 ENCOUNTER — Encounter: Payer: Self-pay | Admitting: Physical Therapy

## 2024-01-04 ENCOUNTER — Other Ambulatory Visit: Payer: Self-pay | Admitting: Nurse Practitioner

## 2024-01-04 DIAGNOSIS — R2681 Unsteadiness on feet: Secondary | ICD-10-CM

## 2024-01-04 DIAGNOSIS — R42 Dizziness and giddiness: Secondary | ICD-10-CM

## 2024-01-04 DIAGNOSIS — R2689 Other abnormalities of gait and mobility: Secondary | ICD-10-CM | POA: Diagnosis not present

## 2024-01-04 DIAGNOSIS — M6281 Muscle weakness (generalized): Secondary | ICD-10-CM

## 2024-01-04 NOTE — Telephone Encounter (Signed)
 Last Fill: 12/03/23 30 tabs/0 RF  Last OV: 12/14/23 Next OV: 01/06/24  Routing to provider for review/authorization.

## 2024-01-04 NOTE — Telephone Encounter (Signed)
 Copied from CRM [id number]. Topic: Clinical - Medication Refill >> Jan 04, 2024  3:42 PM Dennison Nancy wrote: Most Recent Primary Care Visit:  Provider: MAST, MAN X  Department: [facility]  Visit Type: OFFICE VISIT  Date: 12/21/2023  Medication: HYDROcodone-acetaminophen (NORCO/VICODIN) 5-325 MG tablet  Has the patient contacted their pharmacy? No (Agent: If no, request that the patient contact the pharmacy for the refill. If patient does not wish to contact the pharmacy document the reason why and proceed with request.) (Agent: If yes, when and what did the pharmacy advise?)  Is this the correct pharmacy for this prescription? Yes If no, delete pharmacy and type the correct one.  This is the patient's preferred pharmacy:  [facility] - [place], [place] - [address] [place] [address] Phone: [phone] Fax: [phone] [id number] - [place], [place] - [address] [place] [address] Phone: [phone] Fax: [phone]   Has the prescription been filled recently? Yes  Is the patient out of the medication? Yes , been out for about a week   Has the patient been seen for an appointment in the last year OR does the patient have an upcoming appointment? Yes  Can we respond through MyChart? No  Agent: Please be advised that Rx refills may take up to 3 business days. We ask that you follow-up with your pharmacy.

## 2024-01-04 NOTE — Telephone Encounter (Signed)
 Chief Complaint: Dizziness exacerbation Symptoms: Dizziness with change of head movement Frequency: ongoing for 10 years but exacerbated lately Pertinent Negatives: Patient denies n/a Disposition: [] ED /[] Urgent Care (no appt availability in office) / [x] Appointment(In office/virtual)/ []  [facility]/ [] Home Care/ [] Refused Recommended Disposition /[] [facility]/ []  Follow-up with PCP Additional Notes: Patient called in stating she would like to be evaluated soon for worsened dizziness. Patient was recommended to call in by her Physical Therapist. Patient states her PT also recommended asking for referral to ENT Dr Suszanne Conners. Patient states she has been having some mild chest pain lately but has refused triaging for chest pain, stating she does not feel she has cardiac symptoms at this time and wants to focus on the dizziness. Patient stated she was concerned about symptoms of stroke due to info from PT. Patient denies numbness/tingling on one side of the body, but states she has mild intermittent tingling in both hands for years after a fall. Patient appt made for Wednesday for evaluation.    Copied from CRM [id number]. Topic: Clinical - Red Word Triage >> Jan 04, 2024  3:46 PM Dennison Nancy wrote: Red Word that prompted transfer to Nurse Triage: patient been going to Physical Therapy and physcial therapist stated patient need more testing  patient dizzyness getting worse , been suffering with dizzyness for a long time Reason for Disposition  [1] MODERATE dizziness (e.g., interferes with normal activities) AND [2] has been evaluated by doctor (or NP/PA) for this  Answer Assessment - Initial Assessment Questions 1. DESCRIPTION: "Describe your dizziness."     Sometimes she is spinning, sometimes room is spinning 2. LIGHTHEADED: "Do you feel lightheaded?" (e.g., somewhat faint, woozy, weak upon standing)     Sometimes 3. VERTIGO: "Do you feel like either you or the room is spinning or  tilting?" (i.e. vertigo)     Sometimes 4. SEVERITY: "How bad is it?"  "Do you feel like you are going to faint?" "Can you stand and walk?"   - MILD: Feels slightly dizzy, but walking normally.   - MODERATE: Feels unsteady when walking, but not falling; interferes with normal activities (e.g., school, work).   - SEVERE: Unable to walk without falling, or requires assistance to walk without falling; feels like passing out now.      Worsening 5. ONSET:  "When did the dizziness begin?"     Ongoing for 10 years 6. AGGRAVATING FACTORS: "Does anything make it worse?" (e.g., standing, change in head position)     "When I move my head" 7. HEART RATE: "Can you tell me your heart rate?" "How many beats in 15 seconds?"  (Note: not all patients can do this)       No  8. CAUSE: "What do you think is causing the dizziness?"     Unsure 9. RECURRENT SYMPTOM: "Have you had dizziness before?" If Yes, ask: "When was the last time?" "What happened that time?"     Yes for over 10 years 10. OTHER SYMPTOMS: "Do you have any other symptoms?" (e.g., fever, chest pain, vomiting, diarrhea, bleeding)       Chest pain  Protocols used: Dizziness - Lightheadedness-A-AH

## 2024-01-04 NOTE — Therapy (Signed)
 OUTPATIENT PHYSICAL THERAPY TREATMENT DISCHARGE SUMMARY   Patient Name: Sydney Reid MRN: [id number] DOB:1941/01/31, 83 y.o., female Today's Date: 01/04/2024  END OF SESSION:  PT End of Session - 01/04/24 1428     Visit Number 4    Number of Visits 13    Date for PT Re-Evaluation --    Authorization Type MCR and BCBS    Authorization Time Period --    Progress Note Due on Visit 10    PT Start Time 1428    PT Stop Time 1506    PT Time Calculation (min) 38 min    Activity Tolerance Patient tolerated treatment well    Behavior During Therapy [facility] for tasks assessed/performed               Past Medical History:  Diagnosis Date   Complication of anesthesia    Difficult intubation    20-25 years ago   H/O total knee replacement 10/28/1999   Right   High cholesterol    History of tonsillectomy and adenoidectomy 10/27/1944   History of transcatheter aortic valve replacement (TAVR) 08/06/2021   s/p Edwards 23mm S3 vis TF approach with Dr. Excell Seltzer and Dr. Laneta Simmers   Psoriatic arthritis (HCC) 10/27/1988   Sleep apnea with use of continuous positive airway pressure (CPAP) 10/27/2002   Thyroid disease    Past Surgical History:  Procedure Laterality Date   CARDIAC CATHETERIZATION     CATARACT EXTRACTION W/ INTRAOCULAR LENS IMPLANT Bilateral    FOOT SURGERY     fused arch in left foot   GROIN DISSECTION  08/06/2021   Procedure: Drucie Ip EXPLORATION ;  Surgeon: Maeola Harman, MD;  Location: [facility] OR;  Service: Vascular;;   INTRAOPERATIVE TRANSTHORACIC ECHOCARDIOGRAM N/A 08/06/2021   Procedure: INTRAOPERATIVE TRANSTHORACIC ECHOCARDIOGRAM;  Surgeon: Orbie Pyo, MD;  Location: [facility] OR;  Service: Cardiovascular;  Laterality: N/A;   LOWER EXTREMITY ANGIOGRAM  08/06/2021   Procedure: LEFT LOWER EXTREMITY ANGIOGRAM;  Surgeon: Orbie Pyo, MD;  Location: [facility] OR;  Service: Cardiovascular;;   PATCH ANGIOPLASTY  08/06/2021   Procedure: PATCH ANGIOPLASTY OF FEMORAL  ARTERY USING LEFT GREATER SAPHENOUS VEIN;  Surgeon: Maeola Harman, MD;  Location: [facility] OR;  Service: Vascular;;   RIGHT/LEFT HEART CATH AND CORONARY ANGIOGRAPHY N/A 04/16/2021   Procedure: RIGHT/LEFT HEART CATH AND CORONARY ANGIOGRAPHY;  Surgeon: Yates Decamp, MD;  Location: [facility] INVASIVE CV LAB;  Service: Cardiovascular;  Laterality: N/A;   SPINE SURGERY     TONSILLECTOMY     TOTAL KNEE ARTHROPLASTY Left    TRANSCATHETER AORTIC VALVE REPLACEMENT, TRANSFEMORAL N/A 08/06/2021   Procedure: TRANSCATHETER AORTIC VALVE REPLACEMENT, TRANSFEMORAL;  Surgeon: Orbie Pyo, MD;  Location: [facility] OR;  Service: Cardiovascular;  Laterality: N/A;   Patient Active Problem List   Diagnosis Date Noted   URI (upper respiratory infection) 12/21/2023   Epiretinal membrane (ERM) of right eye 08/28/2021   Fuchs' corneal dystrophy 08/28/2021   Peripheral vascular disease (HCC) 08/28/2021   Cataract, bilateral 08/28/2021   Unspecified atherosclerosis of native arteries of extremities, bilateral legs (HCC) 08/28/2021   S/P TAVR (transcatheter aortic valve replacement) 08/06/2021   Asymptomatic bilateral carotid artery stenosis 05/09/2021   Severe aortic stenosis 04/16/2021   Polycythemia vera (HCC) 02/14/2021   Osteopenia 01/02/2021   Pain in right knee 07/04/2020   Hypothyroidism due to acquired atrophy of thyroid 05/12/2018   Dizziness and giddiness 05/12/2018   Closed fracture of proximal end of left humerus 02/25/2018   Chronic pain syndrome 07/08/2017  Psoriatic arthritis, 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/08/2017   Hyperlipidemia 07/14/2016   Dermatochalasis of both upper eyelids 08/31/2014   Left foot pain 10/04/2012   Lumbosacral spondylosis without myelopathy 08/07/2011   Arthropathy of cervical spine 07/19/2011   Degenerative joint disease of cervical spine 07/19/2011   Cellulitis and abscess 06/20/2010   Hypothyroidism 06/20/2010   Psoriasis with arthropathy (HCC) 06/20/2010   OSA treated with  BiPAP 06/20/2010   Sialoadenitis 03/04/2007    PCP: [name], NP  REFERRING PROVIDER: [name], NP  REFERRING DIAG: Diagnosis R29.898 (ICD-10-CM) - Bilateral leg weakness M15.0 (ICD-10-CM) - Primary osteoarthritis involving multiple joints  THERAPY DIAG:  Unsteadiness on feet - Plan: PT plan of care cert/re-cert  Muscle weakness (generalized) - Plan: PT plan of care cert/re-cert  Dizziness and giddiness - Plan: PT plan of care cert/re-cert  Other abnormalities of gait and mobility - Plan: PT plan of care cert/re-cert  Rationale for Evaluation and Treatment: Rehabilitation  ONSET DATE: chronic   SUBJECTIVE:   SUBJECTIVE STATEMENT: Has missed about 3 weeks of PT; had COVID.   No change in dizziness; she thinks it's coming from the neck   PERTINENT HISTORY: See above  PAIN:  Are you having pain? YES 6/10 all over  PRECAUTIONS: Fall and Other: hx destructive psoriatic arthritis    RED FLAGS: None   WEIGHT BEARING RESTRICTIONS: No  FALLS:  Has patient fallen in last 6 months? Yes. Number of falls 1- tripped on step in home; no FOF   LIVING ENVIRONMENT: Lives with: lives alone Lives in: Other townhouse  Stairs: 5 STE in the front, 3 STE from garage; second floor with rails; also has one step from main level to sunroom  Has following equipment at home: Dan Humphreys - 2 wheeled  OCCUPATION: retired- used to work in Engineer, agricultural with [employer]    PLOF: Independent, Independent with basic ADLs, Independent with gait, and Independent with transfers  PATIENT GOALS: improve balance  NEXT MD VISIT: Referring October 2025  OBJECTIVE:  Note: Objective measures were completed at Evaluation unless otherwise noted.    PATIENT SURVEYS:  FOTO 41, predicted 47 in 13 visits   COGNITION: Overall cognitive status: Within functional limits for tasks assessed     SENSATION: [facility]    LOWER EXTREMITY MMT:  MMT Right eval Left eval  Rt/Lt 01/04/24  Hip flexion 4+ 3+ 5/4  Hip extension     Hip abduction 4+ 3 5/5  Hip adduction     Hip internal rotation     Hip external rotation     Knee flexion 4+ 4 5/5  Knee extension 5 4+ 5/5  Ankle dorsiflexion 4+ 4+ 5/5  Ankle plantarflexion     Ankle inversion 5 5   Ankle eversion 5 5    (Blank rows = not tested)    FUNCTIONAL TESTS:  3 minute walk test: TBD Berg Balance Scale: 38/56  TREATMENT DATE:  01/04/24 Self Care Discussed/review of symptoms and prior imaging.  Encouraged pt to try to schedule sooner appt with NP to discuss symptoms.  Review of FOTO score and goals of care, PT POC  TherEx Review of HEP and discussed current activities.  Pt feels comfortable with current HEP MMT - see above for details Vertebral artery test mildly positive on Lt for seeing spots  12/14/23 Self Care Discussed/review of symptoms and prior imaging.  For fullness discussed decongestant options.  Feel she may benefit from further imagine to look at blood flow as well as possibly see ENT to due to prior Lt ear injury  BP supine x 5 min: 136/91 HR 98; BP standing x 1 min: 126/88 HR 112; BP standing x 3 min: 144/98 HR 106  Neuro Re-Ed Rotary chair test negative Discussed current HEP as well as habituation - pt verbalized compliance and understanding of habituation exercises but at this time symptoms seem to be getting worse  TherEx Seated LAQ 2x10 bil; 4# Seated marching 2x10 bil; 4# Seated hip abduction 2x10; L3 band without foot support Seated trunk rotation with 4# weight outstretched, light foot support x 20 bil  12/07/23 BBPV testing negative for right ear and left ear Horizontal canal testing negative  NMR Standing Gaze stabilization horizontal head turns 30 sec X 2 Standing gaze stabilization head nods 30 sec X 3 Modified 1/2 tandem  balance 30 sec X 3 bilat with close supervision Balance feet apart and eyes closed 10 sec X 5 with close supervision  Therex:  Nu step LE only as she says she can't grip the arm handles. L4 X 8 min  HEP update and review    11/20/23  Eval, care planning, HEP  Bridges x5 Clams L LE x10 red TB STS red TB around knees x3 Discussed/demonstrated narrow BOS with chair    PATIENT EDUCATION:  Education details: exam findings, POC, HEP, plan to assess and tx vertigo during POC, assured her that L sided weakness is not necessarily indicative of serious neuro condition like CVA or TIA  Person educated: Patient Education method: Explanation, Demonstration, and Handouts Education comprehension: verbalized understanding, returned demonstration, and needs further education  HOME EXERCISE PROGRAM: Access Code: [email or web address] URL: [email or web address] Date: 12/07/2023 Prepared by: Ivery Quale  Exercises - Supine Bridge  - 1 x daily - 7 x weekly - 2 sets - 10 reps - 1 seconds  hold - Clamshell with Resistance  - 1 x daily - 7 x weekly - 2 sets - 10 reps - 1 second  hold - Sit to Stand with Resistance Around Legs  - 1 x daily - 7 x weekly - 1 sets - 10 reps - Standing Narrow Base of Support with Chair  - 1 x daily - 7 x weekly - 2 sets - 5 reps - 10-20 seconds  hold - Standing Gaze Stabilization with Head Rotation  - 2 x daily - 6 x weekly - 1 sets - 2 reps - 30 sec hold - Standing Gaze Stabilization with Head Nod  - 2 x daily - 6 x weekly - 1 sets - 2 reps - 30 hold - Standing Romberg to 1/2 Tandem Stance  - 2 x daily - 6 x weekly - 1 sets - 2 reps - 30 sec hold - Standing Balance in Corner with Eyes Closed  - 2 x daily - 6 x weekly - 1 sets - 5 reps - 10 sec hold  ASSESSMENT:  CLINICAL  IMPRESSION: Pt has met all assessed goals today and is ready for d/c.  Deferred BERG today as pt still unsteady but feel dizziness symptoms are multifactorial and feel she needs to follow up with  her NP to discuss further assessment.  Will d/c PT today.    OBJECTIVE IMPAIRMENTS: Abnormal gait, decreased activity tolerance, decreased balance, decreased knowledge of use of DME, decreased mobility, difficulty walking, decreased strength, and postural dysfunction.   ACTIVITY LIMITATIONS: standing, transfers, and locomotion level  PARTICIPATION LIMITATIONS: driving, shopping, and community activity  PERSONAL FACTORS: Age, Behavior pattern, Fitness, Past/current experiences, Social background, and Time since onset of injury/illness/exacerbation are also affecting patient's functional outcome.   REHAB POTENTIAL: Fair chronic destructive psoriatic arthritis  CLINICAL DECISION MAKING: Evolving/moderate complexity  EVALUATION COMPLEXITY: Moderate   GOALS: Goals reviewed with patient? No  SHORT TERM GOALS: Target date: 12/11/2023   Will be compliant with appropriate progressive HEP  Baseline: Goal status: met 01/04/24  2.  Vestibular w/u to have been completed/exercises assigned  Baseline:  Goal status: met 01/04/24    LONG TERM GOALS: Target date: 01/01/2024    MMT to have improved by at least one grade in all weak groups  Baseline:  Goal status: MET 01/04/24  2.  Will score at least 48 on the Berg to show improved balance skills  Baseline:  Goal status: DEFERRED 01/04/24  3.  Education to be provided on appropriate local group exercise classes to assist her in being more active on an independent basis  Baseline:  Goal status: MET 01/04/24  4.  Will be able to ascend/descent steps with U UE support and minimal unsteadiness  Baseline:  Goal status: MET 01/04/24  5.  FOTO score to be at predicted value by time of DC  Baseline:  Goal status: MET 01/04/24     PLAN:  PT FREQUENCY: 2x/week  PT DURATION: 6 weeks  PLANNED INTERVENTIONS: 97164- PT Re-evaluation, 97110-Therapeutic exercises, 97530- Therapeutic activity, 97112- Neuromuscular re-education, [phone]- Self Care,  [phone]- Gait training, 09811- Aquatic Therapy, and Balance training  PLAN FOR NEXT SESSION: d/c PT today    Moshe Cipro, PT DPT 01/04/24 3:10 PM   PHYSICAL THERAPY DISCHARGE SUMMARY  Visits from Start of Care: 4  Current functional level related to goals / functional outcomes: See above   Remaining deficits: See above   Education / Equipment: HEP   Patient agrees to discharge. Patient goals were partially met. Patient is being discharged due to being pleased with the current functional level.  Clarita Crane, PT, DPT 01/04/24 3:10 PM  [facility] [address] [place], [place], [address] Phone: [phone]   Fax:  [phone]

## 2024-01-05 NOTE — Telephone Encounter (Signed)
 Patient requested refill.  Epic LR: 12/03/2023 Contract Date: 10/06/2022 note added to upcoming appointment to update.   Pended and sent to [facility] for approval.

## 2024-01-06 ENCOUNTER — Ambulatory Visit (INDEPENDENT_AMBULATORY_CARE_PROVIDER_SITE_OTHER): Admitting: Family

## 2024-01-06 ENCOUNTER — Encounter: Payer: Self-pay | Admitting: Family

## 2024-01-06 VITALS — BP 118/60 | HR 94 | Temp 98.2°F | Resp 20 | Ht 64.0 in | Wt 142.8 lb

## 2024-01-06 DIAGNOSIS — M47812 Spondylosis without myelopathy or radiculopathy, cervical region: Secondary | ICD-10-CM

## 2024-01-06 DIAGNOSIS — R42 Dizziness and giddiness: Secondary | ICD-10-CM

## 2024-01-06 MED ORDER — HYDROCODONE-ACETAMINOPHEN 5-325 MG PO TABS: 1.0000 | ORAL_TABLET | Freq: Every day | ORAL | 0 refills | Status: DC

## 2024-01-06 NOTE — Progress Notes (Signed)
 Provider: Harlowe Dowler FNP-C  [name], NP  Patient Care Team: [name], NP as PCP - General (Geriatric Medicine) Yates Decamp, MD as PCP - Cardiology (Cardiology) Timoteo Ace, MD as Referring Physician (Ophthalmology) Casimer Lanius, MD as Consulting Physician (Rheumatology)  Extended Emergency Contact Information Primary Emergency Contact: Sueko, Dimichele Address: [phone]          [place] [address] [place] Home Phone: [phone] Mobile Phone: [phone] Relation: Spouse  Code Status: Full Code  Goals of care: Advanced Directive information    01/06/2024    2:33 PM  Advanced Directives  Does Patient Have a Medical Advance Directive? Yes  Type of Estate agent of [place];Living will  Does patient want to make changes to medical advance directive? No - Patient declined  Copy of Healthcare Power of Attorney in Chart? Yes - validated most recent copy scanned in chart (See row information)     Chief Complaint  Patient presents with   Medical Management of Chronic Issues    Patient wanted discuss the need for some test.    Discussed the use of AI scribe software for clinical note transcription with the patient, who gave verbal consent to proceed.  History of Present Illness   The patient presents with dizziness for evaluation and referral to an ENT specialist. She was referred by a physical therapist for further testing and evaluation by an ENT specialist.  She has experienced dizziness for the past twenty years. Despite undergoing physical therapy, she feels it has reached its limit in terms of benefit. Her physical therapist recommended further tests and a referral to an ENT specialist, specifically Dr. Suszanne Conners at [facility]. She has completed physical therapy and is seeking further evaluation.  A brain MRI conducted a few years ago showed narrowing in a particular artery. She is familiar  with the MRI process, including the use of contrast, and does not require sedation. She has no pacemakers or metal implants, although she has had knee replacements and pins in her body, which she believes are not metal. She has had pins in her feet and a neck fusion with bone taken from her hip.  She has a history of psoriatic arthritis since childhood, resulting in chronic pain. Her neck has been fused, and she believes her dizziness may be related to her neck issues. Despite multiple falls where she hit her head, she does not report current neck pain.  No headaches, syncope, or passing out. She has no issues with nausea or vomiting. She reports some vision issues and uses eyeglasses for distance. She has a history of hitting her ear against a wall several years ago, which she initially thought might be related to her dizziness.   Past Medical History:  Diagnosis Date   Complication of anesthesia    Difficult intubation    20-25 years ago   H/O total knee replacement 10/28/1999   Right   High cholesterol    History of tonsillectomy and adenoidectomy 10/27/1944   History of transcatheter aortic valve replacement (TAVR) 08/06/2021   s/p Edwards 23mm S3 vis TF approach with Dr. Excell Seltzer and Dr. Laneta Simmers   Psoriatic arthritis [facility]) 10/27/1988   Sleep apnea with use of continuous positive airway pressure (CPAP) 10/27/2002   Thyroid disease    Past Surgical History:  Procedure Laterality Date   CARDIAC CATHETERIZATION     CATARACT EXTRACTION W/ INTRAOCULAR LENS IMPLANT Bilateral    FOOT SURGERY  fused arch in left foot   GROIN DISSECTION  08/06/2021   Procedure: GROIN EXPLORATION ;  Surgeon: Maeola Harman, MD;  Location: [facility] OR;  Service: Vascular;;   INTRAOPERATIVE TRANSTHORACIC ECHOCARDIOGRAM N/A 08/06/2021   Procedure: INTRAOPERATIVE TRANSTHORACIC ECHOCARDIOGRAM;  Surgeon: Orbie Pyo, MD;  Location: [facility] OR;  Service: Cardiovascular;  Laterality: N/A;   LOWER EXTREMITY  ANGIOGRAM  08/06/2021   Procedure: LEFT LOWER EXTREMITY ANGIOGRAM;  Surgeon: Orbie Pyo, MD;  Location: [facility] OR;  Service: Cardiovascular;;   PATCH ANGIOPLASTY  08/06/2021   Procedure: PATCH ANGIOPLASTY OF FEMORAL ARTERY USING LEFT GREATER SAPHENOUS VEIN;  Surgeon: Maeola Harman, MD;  Location: [facility] OR;  Service: Vascular;;   RIGHT/LEFT HEART CATH AND CORONARY ANGIOGRAPHY N/A 04/16/2021   Procedure: RIGHT/LEFT HEART CATH AND CORONARY ANGIOGRAPHY;  Surgeon: Yates Decamp, MD;  Location: [facility] INVASIVE CV LAB;  Service: Cardiovascular;  Laterality: N/A;   SPINE SURGERY     TONSILLECTOMY     TOTAL KNEE ARTHROPLASTY Left    TRANSCATHETER AORTIC VALVE REPLACEMENT, TRANSFEMORAL N/A 08/06/2021   Procedure: TRANSCATHETER AORTIC VALVE REPLACEMENT, TRANSFEMORAL;  Surgeon: Orbie Pyo, MD;  Location: [facility] OR;  Service: Cardiovascular;  Laterality: N/A;    Allergies  Allergen Reactions   Codeine Nausea And Vomiting    Weakness and dysequilibrium   Statins Hives    Myalgias     Outpatient Encounter Medications as of 01/06/2024  Medication Sig   aspirin EC 81 MG tablet Take 81 mg by mouth daily. Swallow whole.   Cholecalciferol (VITAMIN D3) 250 MCG (10000 UT) capsule Take 4,000 Units by mouth daily.   etanercept (ENBREL) 50 MG/ML injection Inject 50 mg into the skin every Sunday.    ezetimibe (ZETIA) 10 MG tablet TAKE 1 TABLET BY MOUTH DAILY   fluocinonide ointment (LIDEX) 0.05 % Apply 1 Application topically daily as needed.   HYDROcodone-acetaminophen (NORCO/VICODIN) 5-325 MG tablet Take 1 tablet by mouth daily.   levothyroxine (SYNTHROID) 75 MCG tablet TAKE 1 TABLET BY MOUTH DAILY  BEFORE BREAKFAST   Multiple Vitamins-Minerals (MULTIVITAMIN ADULTS 50+) TABS Take by mouth daily.   pravastatin (PRAVACHOL) 10 MG tablet Take 1 tablet (10 mg total) by mouth as directed. 1 tablet and 2 tablets every other day alternating   No facility-administered encounter medications on file as of  01/06/2024.    Review of Systems  Constitutional:  Negative for appetite change, chills, fatigue, fever and unexpected weight change.  HENT:  Negative for congestion, dental problem, ear discharge, ear pain, hearing loss, nosebleeds, postnasal drip, rhinorrhea, sinus pressure, sinus pain, sneezing, sore throat, tinnitus and trouble swallowing.   Eyes:  Negative for pain, discharge, redness, itching and visual disturbance.  Respiratory:  Negative for cough, chest tightness, shortness of breath and wheezing.   Cardiovascular:  Negative for chest pain, palpitations and leg swelling.  Gastrointestinal:  Negative for abdominal distention, abdominal pain, diarrhea, nausea and vomiting.  Endocrine: Negative for cold intolerance, heat intolerance, polydipsia, polyphagia and polyuria.  Genitourinary:  Negative for difficulty urinating, dysuria, flank pain, frequency and urgency.  Musculoskeletal:  Positive for arthralgias and gait problem. Negative for back pain, joint swelling, myalgias, neck pain and neck stiffness.  Skin:  Negative for color change, pallor, rash and wound.  Neurological:  Positive for dizziness. Negative for syncope, speech difficulty, weakness, light-headedness, numbness and headaches.    Immunization History  Administered Date(s) Administered   Fluad Quad(high Dose 65+) 09/10/2020, 08/07/2021, 10/06/2022   Fluad Trivalent(High Dose 65+) 10/12/2023   Influenza,  High Dose Seasonal PF 07/20/2019   Influenza,inj,Quad PF,6+ Mos 07/08/2017   Influenza-Unspecified 09/13/2012, 11/09/2014, 09/26/2018   PFIZER Comirnaty(Gray Top)Covid-19 Tri-Sucrose Vaccine 01/28/2021   PFIZER(Purple Top)SARS-COV-2 Vaccination 11/13/2019, 12/04/2019, 06/21/2020   Pfizer Covid-19 Vaccine Bivalent Booster 54yrs & up 09/26/2021   Pneumococcal Conjugate-13 11/23/2014   Pneumococcal Polysaccharide-23 10/27/2005   Tdap 10/27/2009   Zoster Recombinant(Shingrix) 12/12/2018, 04/04/2019   Pertinent  Health  Maintenance Due  Topic Date Due   DEXA SCAN  01/27/2024 (Originally 01/01/2023)   INFLUENZA VACCINE  Completed      10/06/2022   10:35 AM 12/15/2022   10:49 AM 08/17/2023   11:00 AM 12/21/2023    4:00 PM 01/06/2024    2:32 PM  Fall Risk  Falls in the past year? 1 0 0 1 1  Was there an injury with Fall? 0 0 0 0 0  Fall Risk Category Calculator 2 0 0 1 1  Fall Risk Category (Retired) Moderate      (RETIRED) Patient Fall Risk Level Moderate fall risk      Patient at Risk for Falls Due to History of fall(s) History of fall(s) History of fall(s) History of fall(s) No Fall Risks  Fall risk Follow up Falls evaluation completed Falls evaluation completed Falls evaluation completed  Falls evaluation completed   Functional Status Survey:    Vitals:   01/06/24 1426  BP: 118/60  Pulse: 94  Resp: 20  Temp: 98.2 F (36.8 C)  SpO2: 98%  Weight: 142 lb 12.8 oz (64.8 kg)  Height: 5\' 4"  (1.626 m)   Body mass index is 24.51 kg/m. Physical Exam  GENERAL: Alert, cooperative, well developed, no acute distress HEENT: Normocephalic, normal oropharynx, moist mucous membranes, ears normal, nose normal, no sinus tenderness CHEST: Clear to auscultation bilaterally, no wheezes, rhonchi, or crackles CARDIOVASCULAR: Normal heart rate and rhythm, S1 and S2 normal without murmurs ABDOMEN: Soft, non-tender, non-distended, without organomegaly, normal bowel sounds EXTREMITIES: No cyanosis, edema, or leg swelling NEUROLOGICAL: Cranial nerves grossly intact, moves all extremities without gross motor or sensory deficit      Labs reviewed: Recent Labs    02/17/23 1023 10/12/23 1111  NA 140 141  K 4.3 4.4  CL 105 105  CO2 28 26  GLUCOSE 91 102  BUN 12 15  CREATININE 1.12* 0.91  CALCIUM 9.9 10.0   Recent Labs    02/17/23 1023 10/12/23 1111  AST 22 21  ALT 13 14  ALKPHOS 68  --   BILITOT 0.7 0.4  PROT 7.7 7.7  ALBUMIN 4.3  --    Recent Labs    02/17/23 1023 05/20/23 1056 10/12/23 1111   WBC 6.1 6.5 7.7  NEUTROABS 3.3 4.0 5,090  HGB 14.8 14.9 14.4  HCT 46.4* 46.8* 45.7*  MCV 84.8 84.8 85.1  PLT 222 211 278   Lab Results  Component Value Date   TSH 2.09 10/12/2023   No results found for: "HGBA1C" Lab Results  Component Value Date   CHOL 227 (H) 10/12/2023   HDL 53 10/12/2023   LDLCALC 136 (H) 10/12/2023   TRIG 233 (H) 10/12/2023   CHOLHDL 4.3 10/12/2023    Significant Diagnostic Results in last 30 days:  No results found.  Assessment/Plan  Chronic Dizziness Chronic dizziness for twenty years with no improvement after physical therapy. Differential diagnosis includes cervical spine issues or vascular stenosis. No syncope or nausea. History of multiple falls and head trauma. Previous brain MRI indicated arterial narrowing, suggesting repeat imaging. - Refer to ENT  specialist Dr. Suszanne Conners at [facility] for further evaluation. - Order repeat brain MRI to assess for vascular stenosis. - Order cervical spine MRI to evaluate for potential cervical spine issues.  Psoriatic Arthritis Long-standing psoriatic arthritis since childhood with chronic pain, including neck pain. Neck surgically fused. No current exacerbation. - continue to follow up with rheumatologist   Vision Issues Reports vision issues. Uses eyeglasses for distance. Has seen an ophthalmologist.  Follow-up Awaiting contact from specialist offices for scheduling of appointments and imaging studies. - Await contact from ENT specialist office to schedule appointment. - Await contact for scheduling of MRI studies.   Family/ staff Communication: Reviewed plan of care with patient verbalized understanding   Labs/tests ordered: None   Next Appointment: Return if symptoms worsen or fail to improve.   Total time: 25 minutes. Greater than 50% of total time spent doing patient education regarding chronic dizziness,Psoriatic arthritis ,Vision changes health maintenance including symptom/medication management.    Caesar Bookman, NP

## 2024-01-08 ENCOUNTER — Other Ambulatory Visit: Payer: Self-pay | Admitting: *Deleted

## 2024-01-08 MED ORDER — HYDROCODONE-ACETAMINOPHEN 5-325 MG PO TABS: 1.0000 | ORAL_TABLET | Freq: Every day | ORAL | 0 refills | Status: DC

## 2024-01-08 NOTE — Telephone Encounter (Signed)
 Copied from CRM [id number]. Topic: Clinical - Prescription Issue >> Jan 08, 2024  8:34 AM Maxwell Marion wrote: Reason for CRM: Patient said prescription for Hydrocodone-acetaminophen (norco/vicodin) 5-325 MG tablet was sent to [facility] and they will only fill a certain amount. She said she needs medication to be sent to [facility] on [address]. Says this needs to be done today because at night, she's unable to get any sleep and the lack of sleep is making her become cranky.     Medication was sent to [facility] and patient is requesting it to be sent to [facility].  Canceled Rx with [facility] and Pended Rx and sent to [facility] for approval.   Epic LR: 12/03/2023 Contract Date: 10/06/2022 note added to upcoming appointment to update.

## 2024-01-11 ENCOUNTER — Other Ambulatory Visit: Payer: Self-pay | Admitting: Nurse Practitioner

## 2024-01-14 DIAGNOSIS — E2839 Other primary ovarian failure: Secondary | ICD-10-CM | POA: Diagnosis not present

## 2024-01-14 DIAGNOSIS — M8588 Other specified disorders of bone density and structure, other site: Secondary | ICD-10-CM | POA: Diagnosis not present

## 2024-01-14 DIAGNOSIS — R29890 Loss of height: Secondary | ICD-10-CM | POA: Diagnosis not present

## 2024-01-14 DIAGNOSIS — Z1231 Encounter for screening mammogram for malignant neoplasm of breast: Secondary | ICD-10-CM | POA: Diagnosis not present

## 2024-01-14 LAB — HM MAMMOGRAPHY

## 2024-01-14 LAB — HM DEXA SCAN

## 2024-01-18 ENCOUNTER — Encounter: Payer: Self-pay | Admitting: Nurse Practitioner

## 2024-02-01 ENCOUNTER — Ambulatory Visit: Payer: Self-pay

## 2024-02-01 ENCOUNTER — Encounter: Payer: Self-pay | Admitting: Family

## 2024-02-01 ENCOUNTER — Ambulatory Visit: Admitting: Family

## 2024-02-01 VITALS — BP 116/70 | HR 75 | Temp 97.8°F | Resp 17 | Ht 64.0 in | Wt 143.6 lb

## 2024-02-01 DIAGNOSIS — R413 Other amnesia: Secondary | ICD-10-CM | POA: Diagnosis not present

## 2024-02-01 DIAGNOSIS — R42 Dizziness and giddiness: Secondary | ICD-10-CM | POA: Diagnosis not present

## 2024-02-01 DIAGNOSIS — I6523 Occlusion and stenosis of bilateral carotid arteries: Secondary | ICD-10-CM

## 2024-02-01 DIAGNOSIS — S7002XA Contusion of left hip, initial encounter: Secondary | ICD-10-CM

## 2024-02-01 MED ORDER — MECLIZINE HCL 12.5 MG PO TABS: 12.5000 mg | ORAL_TABLET | Freq: Three times a day (TID) | ORAL | 0 refills | Status: DC | PRN

## 2024-02-01 NOTE — Telephone Encounter (Signed)
 Copied from CRM [id number]. Topic: Clinical - Red Word Triage >> Feb 01, 2024  8:26 AM Hamdi H wrote: Red Word that prompted transfer to Nurse Triage: Patient doesn't remember what she did for a period of time on Saturday and she has been experiencing worse dizziness since Friday.  Chief Complaint: Dizziness exacerbation Symptoms: Minor headache yesterday, feeling unsteady (baseline) Frequency: Chronic, worsening Friday Pertinent Negatives: Patient denies weakness Disposition: [] ED /[] Urgent Care (no appt availability in office) / [x] Appointment(In office/virtual)/ []  [facility]/ [] Home Care/ [] Refused Recommended Disposition /[] [facility]/ []  Follow-up with PCP Additional Notes: Patient called in to report a dizziness exacerbation. Patient stated she has struggled with dizziness for 10 years, but her symptoms worsened on Friday of last week. Patient was seen in office on 01/06/24 for dizziness exacerbation. Per chart, patient instructed to return if symptoms worsen or fail to improve. Patient stated symptoms worsened on Friday. Patient stated she also had an episode on Saturday where she doesn't remember showering or getting ready for the day. Patient stated she feels unsteady/off-balance at baseline. Patient stated she had a minor headache yesterday. Patient denied the following: one-sided weakness/numbness, changes in speech, new onset of changes in vision, facial drooping and difficulty breathing/swallowing. Advised patient to see a provider within 3 days, per protocol. No availability with PCP. Scheduled same day appointment with alternate provider in office. Provided care advice and instructed patient to call back if symptoms worsen. Patient complied.   Reason for Disposition  [1] MODERATE dizziness (e.g., interferes with normal activities) AND [2] has been evaluated by doctor (or NP/PA) for this  Answer Assessment - Initial Assessment Questions 1. DESCRIPTION: "Describe  your dizziness."     States dizziness has been steady, but was horrible on Friday 2. LIGHTHEADED: "Do you feel lightheaded?" (e.g., somewhat faint, woozy, weak upon standing)     Yes "foggy"/lightheaded 3. VERTIGO: "Do you feel like either you or the room is spinning or tilting?" (i.e. vertigo)     States room was spinning on Friday, but now is back to baseline of "lack of balance"  4. SEVERITY: "How bad is it?"  "Do you feel like you are going to faint?" "Can you stand and walk?"   - MILD: Feels slightly dizzy, but walking normally.   - MODERATE: Feels unsteady when walking, but not falling; interferes with normal activities (e.g., school, work).   - SEVERE: Unable to walk without falling, or requires assistance to walk without falling; feels like passing out now.      States she prefers to hold onto something to walk, denies passing out or falling 5. ONSET:  "When did the dizziness begin?"     States she has been dizzy her whole life, but dizziness worsened on Friday 6. AGGRAVATING FACTORS: "Does anything make it worse?" (e.g., standing, change in head position)     Standing 7. HEART RATE: "Can you tell me your heart rate?" "How many beats in 15 seconds?"  (Note: not all patients can do this)       N/A 8. CAUSE: "What do you think is causing the dizziness?"     Unknown 9. RECURRENT SYMPTOM: "Have you had dizziness before?" If Yes, ask: "When was the last time?" "What happened that time?"     Yes, states she has dealt with this for 10 years, but this is an exacerbation  10. OTHER SYMPTOMS: "Do you have any other symptoms?" (e.g., fever, chest pain, vomiting, diarrhea, bleeding)  States she took a shower, fixed her hair and got dressed on Saturday without remembering it, minor headache on Sunday, states distant vision is blurry at baseline, denies changes in speech, denies one-sided numbness/weakness, denies facial drooping, states she is off-balance at baseline  Protocols used:  Dizziness - Lightheadedness-A-AH

## 2024-02-01 NOTE — Telephone Encounter (Signed)
 Waters, Erin C, RN  [facility]1 minute ago (8:53 AM)    Lorain Childes- same day appointment

## 2024-02-07 NOTE — Progress Notes (Signed)
 Provider: Choya Tornow FNP-C  Verma Gobble, NP  Patient Care Team: Verma Gobble, NP as PCP - General (Geriatric Medicine) Knox Perl, MD as PCP - Cardiology (Cardiology) Kala Orchard, MD as Referring Physician (Ophthalmology) Larrie Po, MD as Consulting Physician (Rheumatology)  Extended Emergency Contact Information Primary Emergency Contact: Aleida, Crandell Address: [phone]          [place] [address] [place] Home Phone: [phone] Mobile Phone: [phone] Relation: Spouse  Code Status:  Full Code  Goals of care: Advanced Directive information    02/01/2024   10:58 AM  Advanced Directives  Does Patient Have a Medical Advance Directive? Yes  Type of Estate agent of [place];Living will  Does patient want to make changes to medical advance directive? No - Patient declined  Copy of Healthcare Power of Attorney in Chart? Yes - validated most recent copy scanned in chart (See row information)     Chief Complaint  Patient presents with   Acute Visit    Dizziness exacerbation.    Discussed the use of AI scribe software for clinical note transcription with the patient, who gave verbal consent to proceed.  History of Present Illness   The patient, with carotid stenosis, presents with worsening dizziness and memory lapses.  She has experienced dizziness for the past 10 to 12 years, with a recent worsening of symptoms. The dizziness is described as a sensation similar to being on a boat and is exacerbated by positional changes such as standing up or turning in bed. No recent falls have occurred, although she recalls a fall about two months ago. She has tried Sudafed for relief, but it has been ineffective.  On Saturday, she experienced a memory lapse where she did not recall getting dressed or putting away her lotions after a shower. She is concerned about the possibility of a transient ischemic attack  (TIA) but did not experience any paralysis or weakness. She has not had similar memory lapses before.  She has a history of carotid stenosis, which is not severe enough to warrant intervention. An MRI is scheduled for May 1st to investigate a vein noticed by a physical therapist during a previous MRI. She has not yet seen an ear, nose, and throat specialist due to appointment availability.  Her current medications include Enbrel, Zetia, hydrocodone (taken at night), and levothyroxine. She has stopped taking pravastatin due to severe itching and no longer uses a multivitamin.  She lives alone and manages her daily activities independently. She reports a swollen and bruised hip, noticed on Saturday or Sunday, but does not recall any specific injury or fall. The hip is painful to touch but not when walking.  No chest pain, palpitations, or frequent urination. Occasional chest discomfort and a history of dizziness. No weakness or paralysis. Pain in her hip when touched but not when walking.   Past Medical History:  Diagnosis Date   Complication of anesthesia    Difficult intubation    20-25 years ago   H/O total knee replacement 10/28/1999   Right   High cholesterol    History of tonsillectomy and adenoidectomy 10/27/1944   History of transcatheter aortic valve replacement (TAVR) 08/06/2021   s/p Edwards 23mm S3 vis TF approach with Dr. Arlester Ladd and Dr. Sherene Dilling   Psoriatic arthritis [facility]) 10/27/1988   Sleep apnea with use of continuous positive airway pressure (CPAP) 10/27/2002   Thyroid disease    Past Surgical History:  Procedure Laterality  Date   CARDIAC CATHETERIZATION     CATARACT EXTRACTION W/ INTRAOCULAR LENS IMPLANT Bilateral    FOOT SURGERY     fused arch in left foot   GROIN DISSECTION  08/06/2021   Procedure: GROIN EXPLORATION ;  Surgeon: Adine Hoof, MD;  Location: [facility] OR;  Service: Vascular;;   INTRAOPERATIVE TRANSTHORACIC ECHOCARDIOGRAM N/A 08/06/2021    Procedure: INTRAOPERATIVE TRANSTHORACIC ECHOCARDIOGRAM;  Surgeon: Kyra Phy, MD;  Location: [facility] OR;  Service: Cardiovascular;  Laterality: N/A;   LOWER EXTREMITY ANGIOGRAM  08/06/2021   Procedure: LEFT LOWER EXTREMITY ANGIOGRAM;  Surgeon: Kyra Phy, MD;  Location: [facility] OR;  Service: Cardiovascular;;   PATCH ANGIOPLASTY  08/06/2021   Procedure: PATCH ANGIOPLASTY OF FEMORAL ARTERY USING LEFT GREATER SAPHENOUS VEIN;  Surgeon: Adine Hoof, MD;  Location: [facility] OR;  Service: Vascular;;   RIGHT/LEFT HEART CATH AND CORONARY ANGIOGRAPHY N/A 04/16/2021   Procedure: RIGHT/LEFT HEART CATH AND CORONARY ANGIOGRAPHY;  Surgeon: Knox Perl, MD;  Location: [facility] INVASIVE CV LAB;  Service: Cardiovascular;  Laterality: N/A;   SPINE SURGERY     TONSILLECTOMY     TOTAL KNEE ARTHROPLASTY Left    TRANSCATHETER AORTIC VALVE REPLACEMENT, TRANSFEMORAL N/A 08/06/2021   Procedure: TRANSCATHETER AORTIC VALVE REPLACEMENT, TRANSFEMORAL;  Surgeon: Thukkani, Arun K, MD;  Location: [facility] OR;  Service: Cardiovascular;  Laterality: N/A;    Allergies  Allergen Reactions   Codeine Nausea And Vomiting    Weakness and dysequilibrium   Statins Hives    Myalgias     Outpatient Encounter Medications as of 02/01/2024  Medication Sig   aspirin EC 81 MG tablet Take 81 mg by mouth daily. Swallow whole.   Cholecalciferol (VITAMIN D3) 250 MCG (10000 UT) capsule Take 4,000 Units by mouth daily.   etanercept (ENBREL) 50 MG/ML injection Inject 50 mg into the skin every Sunday.    ezetimibe (ZETIA) 10 MG tablet TAKE 1 TABLET BY MOUTH DAILY   fluocinonide ointment (LIDEX) 0.05 % Apply 1 Application topically daily as needed.   HYDROcodone-acetaminophen (NORCO/VICODIN) 5-325 MG tablet Take 1 tablet by mouth daily.   levothyroxine (SYNTHROID) 75 MCG tablet TAKE 1 TABLET BY MOUTH DAILY  BEFORE BREAKFAST   meclizine (ANTIVERT) 12.5 MG tablet Take 1 tablet (12.5 mg total) by mouth 3 (three) times daily as needed for dizziness.    [DISCONTINUED] Multiple Vitamins-Minerals (MULTIVITAMIN ADULTS 50+) TABS Take by mouth daily. (Patient not taking: Reported on 02/01/2024)   [DISCONTINUED] pravastatin (PRAVACHOL) 10 MG tablet Take 1 tablet (10 mg total) by mouth as directed. 1 tablet and 2 tablets every other day alternating   No facility-administered encounter medications on file as of 02/01/2024.    Review of Systems  Constitutional:  Negative for appetite change, chills, fatigue, fever and unexpected weight change.  HENT:  Negative for congestion, dental problem, ear discharge, ear pain, facial swelling, hearing loss, nosebleeds, postnasal drip, rhinorrhea, sinus pressure, sinus pain, sneezing, sore throat, tinnitus and trouble swallowing.   Eyes:  Negative for pain, discharge, redness, itching and visual disturbance.  Respiratory:  Negative for cough, chest tightness, shortness of breath and wheezing.   Cardiovascular:  Negative for chest pain, palpitations and leg swelling.  Gastrointestinal:  Negative for abdominal distention, abdominal pain, blood in stool, constipation, diarrhea, nausea and vomiting.  Endocrine: Negative for cold intolerance, heat intolerance, polydipsia, polyphagia and polyuria.  Genitourinary:  Negative for difficulty urinating, dysuria, flank pain, frequency and urgency.  Musculoskeletal:  Negative for arthralgias, back pain, gait problem, joint swelling, myalgias, neck  pain and neck stiffness.  Skin:  Negative for color change, pallor, rash and wound.  Neurological:  Positive for dizziness. Negative for syncope, speech difficulty, weakness, light-headedness, numbness and headaches.  Hematological:  Does not bruise/bleed easily.  Psychiatric/Behavioral:  Negative for agitation, behavioral problems, confusion, hallucinations, self-injury, sleep disturbance and suicidal ideas. The patient is not nervous/anxious.        Reports memory loss after her shower     Immunization History  Administered  Date(s) Administered   Fluad Quad(high Dose 65+) 09/10/2020, 08/07/2021, 10/06/2022   Fluad Trivalent(High Dose 65+) 10/12/2023   Influenza, High Dose Seasonal PF 07/20/2019   Influenza,inj,Quad PF,6+ Mos 07/08/2017   Influenza-Unspecified 09/13/2012, 11/09/2014, 09/26/2018   PFIZER Comirnaty(Gray Top)Covid-19 Tri-Sucrose Vaccine 01/28/2021   PFIZER(Purple Top)SARS-COV-2 Vaccination 11/13/2019, 12/04/2019, 06/21/2020   Pfizer Covid-19 Vaccine Bivalent Booster 26yrs & up 09/26/2021   Pneumococcal Conjugate-13 11/23/2014   Pneumococcal Polysaccharide-23 10/27/2005   Tdap 10/27/2009   Zoster Recombinant(Shingrix) 12/12/2018, 04/04/2019   Pertinent  Health Maintenance Due  Topic Date Due   INFLUENZA VACCINE  05/27/2024   DEXA SCAN  01/13/2026      10/06/2022   10:35 AM 12/15/2022   10:49 AM 08/17/2023   11:00 AM 12/21/2023    4:00 PM 01/06/2024    2:32 PM  Fall Risk  Falls in the past year? 1 0 0 1 1  Was there an injury with Fall? 0 0 0 0 0  Fall Risk Category Calculator 2 0 0 1 1  Fall Risk Category (Retired) Moderate      (RETIRED) Patient Fall Risk Level Moderate fall risk      Patient at Risk for Falls Due to History of fall(s) History of fall(s) History of fall(s) History of fall(s) No Fall Risks  Fall risk Follow up Falls evaluation completed Falls evaluation completed Falls evaluation completed  Falls evaluation completed   Functional Status Survey:    Vitals:   02/01/24 1059 02/01/24 1103  BP: 116/70   Pulse: 75   Resp: 17   Temp: 97.8 F (36.6 C)   SpO2: 99% 99%  Weight: 143 lb 9.6 oz (65.1 kg)   Height: 5\' 4"  (1.626 m)    Body mass index is 24.65 kg/m. Physical Exam  VITALS: T- 97.8, P- 75, BP- 116/70, SaO2- 99% GENERAL: Alert, cooperative, well developed, no acute distress. HEENT: Normocephalic, normal oropharynx, moist mucous membranes. Right ear canal shows irritation with a small area of bleeding. Left ear canal normal. Nose normal. Mild tenderness over  sinuses. NECK: Neck normal. CHEST: Clear to auscultation bilaterally. No wheezes, rhonchi, or crackles. CARDIOVASCULAR: Normal heart rate and rhythm, S1 and S2 normal without murmurs. ABDOMEN: Soft, non-tender, non-distended, without organomegaly. Normal bowel sounds. EXTREMITIES: No cyanosis or edema. Right hip swollen and bruised, tender to touch. NEUROLOGICAL: Cranial nerves II-XII intact. Moves all extremities without gross motor or sensory deficit. Motor strength 5/5 in all extremities. Normal finger-to-nose test. Awake and alert.  SKIN: No rash,no lesion.left hip contusion with purple bruise tender to touch   PSYCHIATRY/BEHAVIORAL: Mood stable   Labs reviewed: Recent Labs    02/17/23 1023 10/12/23 1111  NA 140 141  K 4.3 4.4  CL 105 105  CO2 28 26  GLUCOSE 91 102  BUN 12 15  CREATININE 1.12* 0.91  CALCIUM 9.9 10.0   Recent Labs    02/17/23 1023 10/12/23 1111  AST 22 21  ALT 13 14  ALKPHOS 68  --   BILITOT 0.7 0.4  PROT 7.7  7.7  ALBUMIN 4.3  --    Recent Labs    02/17/23 1023 05/20/23 1056 10/12/23 1111  WBC 6.1 6.5 7.7  NEUTROABS 3.3 4.0 5,090  HGB 14.8 14.9 14.4  HCT 46.4* 46.8* 45.7*  MCV 84.8 84.8 85.1  PLT 222 211 278   Lab Results  Component Value Date   TSH 2.09 10/12/2023   No results found for: "HGBA1C" Lab Results  Component Value Date   CHOL 227 (H) 10/12/2023   HDL 53 10/12/2023   LDLCALC 136 (H) 10/12/2023   TRIG 233 (H) 10/12/2023   CHOLHDL 4.3 10/12/2023    Significant Diagnostic Results in last 30 days:  No results found.  Assessment/Plan  Chronic Dizziness Chronic dizziness for 10-12 years, worsening recently. Described as a sensation similar to being on a boat, with occasional spinning, exacerbated by positional changes. Differential includes carotid stenosis, inner ear issues, and possible vascular issues in the brain. Recent MRI indicated a constricted vein. No evidence of TIA or stroke based on neurological examination. -  Prescribe amlodipine for dizziness and send prescription to [facility] [address]. - Follow up with MRI on May 1st to assess vascular issues. - Continue monitoring with cardiologist for carotid stenosis.  Memory Loss Episode Recent episode of memory loss with no recall of activities post-shower. No evidence of TIA or stroke based on neurological examination. Possible contributing factors include low blood sugar or medication effects, though unconfirmed. Blood pressure and neurological checks were reassuring. - Monitor for recurrence of memory loss episodes. - Ensure regular meals to prevent low blood sugar.  Carotid Artery Stenosis Known carotid artery stenosis, not severe enough for intervention. Regular follow-up with cardiologist every six months. No recent changes noted. - Continue regular follow-up with cardiologist.  Hip Contusion Swollen and bruised left hip, noticed after a recent episode of dizziness. No recollection of trauma or fall. Painful to touch but not when walking. Bruising suggests possible impact injury.  - Monitor for pain or changes in the hip. - Consider x-ray if pain develops or worsens since patient declined hip X-ray to be ordered today.  General Health Maintenance Discontinued pravastatin due to adverse reaction (itching and rash). Continues on other medications including Enbrel, Zetia, hydrocodone, and levothyroxine. No current use of multivitamins. - Remove pravastatin from medication list due to adverse reaction.   Family/ staff Communication: Reviewed plan of care with patient verbalized understanding  Labs/tests ordered: None   Next Appointment: Return if symptoms worsen or fail to improve.   Total time: 30 minutes. Greater than 50% of total time spent doing patient education regarding Dizziness,Memory loss,Carotid stenosis and health maintenance including symptom/medication management.   Estil Heman, NP

## 2024-02-08 ENCOUNTER — Other Ambulatory Visit: Payer: Self-pay | Admitting: Nurse Practitioner

## 2024-02-08 MED ORDER — HYDROCODONE-ACETAMINOPHEN 5-325 MG PO TABS
1.0000 | ORAL_TABLET | Freq: Every day | ORAL | 0 refills | Status: DC
Start: 2024-02-08 — End: 2024-03-11

## 2024-02-08 NOTE — Telephone Encounter (Signed)
 Patient is requesting a refill of the following medications: Requested Prescriptions   Pending Prescriptions Disp Refills   HYDROcodone-acetaminophen (NORCO/VICODIN) 5-325 MG tablet 30 tablet 0    Sig: Take 1 tablet by mouth daily.    Date of last refill: 01/08/24  Refill amount: 30  Treatment agreement date: No treatment agreement on file, notation made on pending appointment for July 2025

## 2024-02-08 NOTE — Telephone Encounter (Signed)
 Copied from CRM [id number]. Topic: Clinical - Medication Refill >> Feb 08, 2024 10:40 AM Maryln Sober wrote: Most Recent Primary Care Visit:  Provider: Christean Courts C  Department: [facility]  Visit Type: ACUTE  Date: 02/01/2024  Medication: HYDROcodone-acetaminophen (NORCO/VICODIN) 5-325 MG tablet  Has the patient contacted their pharmacy? Yes (Agent: If no, request that the patient contact the pharmacy for the refill. If patient does not wish to contact the pharmacy document the reason why and proceed with request.) (Agent: If yes, when and what did the pharmacy advise?)  Is this the correct pharmacy for this prescription? Yes If no, delete pharmacy and type the correct one.  This is the patient's preferred pharmacy: [facility] [id number] - [place], [place] - [address] [place] [address] Phone: [phone] Fax: [phone]   Has the prescription been filled recently? No  Is the patient out of the medication? Yes  Has the patient been seen for an appointment in the last year OR does the patient have an upcoming appointment? No  Can we respond through MyChart? Yes  Agent: Please be advised that Rx refills may take up to 3 business days. We ask that you follow-up with your pharmacy.

## 2024-02-23 ENCOUNTER — Encounter: Payer: Self-pay | Admitting: Internal Medicine

## 2024-02-25 ENCOUNTER — Ambulatory Visit
Admission: RE | Admit: 2024-02-25 | Discharge: 2024-02-25 | Disposition: A | Discharge status: Home / Self Care | Source: Ambulatory Visit | Attending: Family | Admitting: Family

## 2024-02-25 DIAGNOSIS — R42 Dizziness and giddiness: Secondary | ICD-10-CM | POA: Diagnosis not present

## 2024-02-25 DIAGNOSIS — Z981 Arthrodesis status: Secondary | ICD-10-CM | POA: Diagnosis not present

## 2024-02-25 DIAGNOSIS — M47812 Spondylosis without myelopathy or radiculopathy, cervical region: Secondary | ICD-10-CM

## 2024-02-25 DIAGNOSIS — M4802 Spinal stenosis, cervical region: Secondary | ICD-10-CM | POA: Diagnosis not present

## 2024-02-25 MED ORDER — GADOPICLENOL 0.5 MMOL/ML IV SOLN
7.0000 mL | Freq: Once | INTRAVENOUS | Status: AC | PRN
  Administered 2024-02-25: 7 mL via INTRAVENOUS

## 2024-02-29 ENCOUNTER — Telehealth: Admitting: *Deleted

## 2024-02-29 NOTE — Telephone Encounter (Signed)
 Copied from CRM [id number]. Topic: General - Other >> Quirino Kakos 5, 2025 11:09 AM Retta Caster wrote: Reason for CRM: MR Brain W Wo Contrast (Accession [id number]) (Order [id number]) MR Cervical Spine Wo Contrast (Accession [id number]) (Order [id number])  Patient requesting copy's of these MRI to pick up at office. Needs call back when updated [phone]

## 2024-02-29 NOTE — Telephone Encounter (Signed)
 MRI's have not resulted back yet. Awaiting Readings.  Tried calling patient, LMOM to return call.

## 2024-02-29 NOTE — Telephone Encounter (Signed)
 Patient notified and agreed.  Also gave MyChart number to patient to call to get help with accessing mychart.

## 2024-03-08 ENCOUNTER — Telehealth: Payer: Self-pay

## 2024-03-08 ENCOUNTER — Ambulatory Visit: Payer: Self-pay | Admitting: Family

## 2024-03-08 DIAGNOSIS — R937 Abnormal findings on diagnostic imaging of other parts of musculoskeletal system: Secondary | ICD-10-CM

## 2024-03-08 NOTE — Telephone Encounter (Signed)
 Still waiting for other MRI to be released.

## 2024-03-08 NOTE — Telephone Encounter (Signed)
 Copied from CRM [id number]. Topic: Medical Record Request - Other >> Mar 07, 2024 11:25 AM Wynona Hedger wrote: Reason for CRM: Patient wants copy of both MRI results from 02/25/24. Please call patient when they are ready for pick up [phone]

## 2024-03-09 DIAGNOSIS — I739 Peripheral vascular disease, unspecified: Secondary | ICD-10-CM | POA: Diagnosis not present

## 2024-03-09 DIAGNOSIS — L4052 Psoriatic arthritis mutilans: Secondary | ICD-10-CM | POA: Diagnosis not present

## 2024-03-09 DIAGNOSIS — B351 Tinea unguium: Secondary | ICD-10-CM | POA: Diagnosis not present

## 2024-03-09 DIAGNOSIS — L603 Nail dystrophy: Secondary | ICD-10-CM | POA: Diagnosis not present

## 2024-03-09 DIAGNOSIS — M792 Neuralgia and neuritis, unspecified: Secondary | ICD-10-CM | POA: Diagnosis not present

## 2024-03-09 DIAGNOSIS — L84 Corns and callosities: Secondary | ICD-10-CM | POA: Diagnosis not present

## 2024-03-11 ENCOUNTER — Other Ambulatory Visit: Payer: Self-pay

## 2024-03-11 NOTE — Telephone Encounter (Signed)
 Patient has request refill on medication Hydrocodone . Patient last refill dated 02/08/2024. Patient has No contract on file. Patient medication pend and sent to PCP Verma Gobble, NP . Sign contract added to upcoming appointment with PCP 04/18/2024.

## 2024-03-12 MED ORDER — HYDROCODONE-ACETAMINOPHEN 5-325 MG PO TABS: 1.0000 | ORAL_TABLET | Freq: Every day | ORAL | 0 refills | Status: DC

## 2024-03-14 DIAGNOSIS — L4050 Arthropathic psoriasis, unspecified: Secondary | ICD-10-CM | POA: Diagnosis not present

## 2024-03-14 DIAGNOSIS — M1990 Unspecified osteoarthritis, unspecified site: Secondary | ICD-10-CM | POA: Diagnosis not present

## 2024-03-14 DIAGNOSIS — D751 Secondary polycythemia: Secondary | ICD-10-CM | POA: Diagnosis not present

## 2024-03-14 DIAGNOSIS — M549 Dorsalgia, unspecified: Secondary | ICD-10-CM | POA: Diagnosis not present

## 2024-03-14 DIAGNOSIS — Z79899 Other long term (current) drug therapy: Secondary | ICD-10-CM | POA: Diagnosis not present

## 2024-03-14 DIAGNOSIS — L409 Psoriasis, unspecified: Secondary | ICD-10-CM | POA: Diagnosis not present

## 2024-03-14 DIAGNOSIS — M8580 Other specified disorders of bone density and structure, unspecified site: Secondary | ICD-10-CM | POA: Diagnosis not present

## 2024-03-14 DIAGNOSIS — M2550 Pain in unspecified joint: Secondary | ICD-10-CM | POA: Diagnosis not present

## 2024-03-15 ENCOUNTER — Telehealth: Payer: Self-pay

## 2024-03-15 ENCOUNTER — Ambulatory Visit (INDEPENDENT_AMBULATORY_CARE_PROVIDER_SITE_OTHER): Admitting: Sports Medicine

## 2024-03-15 ENCOUNTER — Encounter: Payer: Self-pay | Admitting: Sports Medicine

## 2024-03-15 VITALS — BP 108/78 | HR 88 | Temp 97.7°F | Resp 18 | Ht 64.0 in | Wt 140.2 lb

## 2024-03-15 DIAGNOSIS — M150 Primary generalized (osteo)arthritis: Secondary | ICD-10-CM | POA: Diagnosis not present

## 2024-03-15 DIAGNOSIS — L4050 Arthropathic psoriasis, unspecified: Secondary | ICD-10-CM

## 2024-03-15 DIAGNOSIS — E034 Atrophy of thyroid (acquired): Secondary | ICD-10-CM | POA: Diagnosis not present

## 2024-03-15 DIAGNOSIS — R42 Dizziness and giddiness: Secondary | ICD-10-CM | POA: Diagnosis not present

## 2024-03-15 MED ORDER — HYDROCODONE-ACETAMINOPHEN 5-325 MG PO TABS: 1.0000 | ORAL_TABLET | Freq: Every day | ORAL | 0 refills | Status: DC

## 2024-03-15 NOTE — Telephone Encounter (Signed)
 Please apologize  for the mistake, and would encourage her to clarify what pharmacy to send this to in the future so it does not happen again. Rx has been sent to [facility]

## 2024-03-15 NOTE — Progress Notes (Signed)
 Careteam: Patient Care Team: Verma Gobble, NP as PCP - General (Geriatric Medicine) Knox Perl, MD as PCP - Cardiology (Cardiology) Kala Orchard, MD as Referring Physician (Ophthalmology) Larrie Po, MD as Consulting Physician (Rheumatology)  PLACE OF SERVICE:  [facility]  Advanced Directive information    Allergies  Allergen Reactions   Codeine Nausea And Vomiting    Weakness and dysequilibrium   Statins Hives    Myalgias     Chief Complaint  Patient presents with   Results    Patient wants to speak to DR ONLY about MRI results. She wants her MRI explained more/sign treatement agreement     Discussed the use of AI scribe software for clinical note transcription with the patient, who gave verbal consent to proceed.  History of Present Illness  Sydney Reid is an 83 year old female with psoriatic arthritis and aortic stenosis who presents with worsening dizziness.  She experiences severe dizziness, particularly when moving her head, getting into or out of bed, and when lying down. The dizziness is described as a sensation of the room spinning, which occurs constantly and affects her daily functioning. Although she has experienced dizziness for years, it has recently worsened. She has undergone physical therapy without relief and experienced a fall in the past six months.  An MRI in 2025,  . She is concerned about circulation issues in her neck, as suggested by a physical therapist. She experienced an event six weeks ago where she lost memory of a short period.  She has a history of psoriatic arthritis affecting multiple joints, including her back, causing significant pain. She takes hydrocodone  at night for pain, which she believes is necessary for sleep, and also takes aspirin  and Zetia . She attempted to resume exercise recently, resulting in knee swelling.  She has aortic stenosis  and  reports chronic  shortness of breath but no chest pain, nausea,  or urinary symptoms. She has experienced mood changes, including a lack of interest in activities, but continues to engage in social activities and volunteer work. She lost her husband in the past two years and has no family nearby.    Review of Systems:  Review of Systems  Constitutional:  Negative for chills and fever.  HENT:  Negative for congestion and sore throat.   Eyes:  Negative for double vision.  Respiratory:  Positive for shortness of breath (exertional). Negative for cough and sputum production.   Cardiovascular:  Negative for chest pain, palpitations and leg swelling.  Gastrointestinal:  Negative for abdominal pain, heartburn and nausea.  Genitourinary:  Negative for dysuria, frequency and hematuria.  Musculoskeletal:  Positive for back pain and neck pain. Negative for falls.  Neurological:  Positive for dizziness.   Negative unless indicated in HPI.   Past Medical History:  Diagnosis Date   Complication of anesthesia    Difficult intubation    20-25 years ago   H/O total knee replacement 10/28/1999   Right   High cholesterol    History of tonsillectomy and adenoidectomy 10/27/1944   History of transcatheter aortic valve replacement (TAVR) 08/06/2021   s/p Edwards 23mm S3 vis TF approach with Dr. Arlester Ladd and Dr. Sherene Dilling   Psoriatic arthritis [facility]) 10/27/1988   Sleep apnea with use of continuous positive airway pressure (CPAP) 10/27/2002   Thyroid  disease    Past Surgical History:  Procedure Laterality Date   CARDIAC CATHETERIZATION     CATARACT EXTRACTION W/ INTRAOCULAR LENS IMPLANT Bilateral    FOOT SURGERY  fused arch in left foot   GROIN DISSECTION  08/06/2021   Procedure: GROIN EXPLORATION ;  Surgeon: Adine Hoof, MD;  Location: [facility] OR;  Service: Vascular;;   INTRAOPERATIVE TRANSTHORACIC ECHOCARDIOGRAM N/A 08/06/2021   Procedure: INTRAOPERATIVE TRANSTHORACIC ECHOCARDIOGRAM;  Surgeon: Kyra Phy, MD;  Location: [facility] OR;  Service:  Cardiovascular;  Laterality: N/A;   LOWER EXTREMITY ANGIOGRAM  08/06/2021   Procedure: LEFT LOWER EXTREMITY ANGIOGRAM;  Surgeon: Kyra Phy, MD;  Location: [facility] OR;  Service: Cardiovascular;;   PATCH ANGIOPLASTY  08/06/2021   Procedure: PATCH ANGIOPLASTY OF FEMORAL ARTERY USING LEFT GREATER SAPHENOUS VEIN;  Surgeon: Adine Hoof, MD;  Location: [facility] OR;  Service: Vascular;;   RIGHT/LEFT HEART CATH AND CORONARY ANGIOGRAPHY N/A 04/16/2021   Procedure: RIGHT/LEFT HEART CATH AND CORONARY ANGIOGRAPHY;  Surgeon: Knox Perl, MD;  Location: [facility] INVASIVE CV LAB;  Service: Cardiovascular;  Laterality: N/A;   SPINE SURGERY     TONSILLECTOMY     TOTAL KNEE ARTHROPLASTY Left    TRANSCATHETER AORTIC VALVE REPLACEMENT, TRANSFEMORAL N/A 08/06/2021   Procedure: TRANSCATHETER AORTIC VALVE REPLACEMENT, TRANSFEMORAL;  Surgeon: Thukkani, Arun K, MD;  Location: [facility] OR;  Service: Cardiovascular;  Laterality: N/A;   Social History:   reports that she quit smoking about 50 years ago. Her smoking use included cigarettes. She started smoking about 68 years ago. She has a 36 pack-year smoking history. She has never used smokeless tobacco. She reports that she does not currently use alcohol. She reports that she does not use drugs.  Family History  Problem Relation Age of Onset   Heart attack Father    Heart disease Sister     Medications: Patient's Medications  New Prescriptions   No medications on file  Previous Medications   ASPIRIN  EC 81 MG TABLET    Take 81 mg by mouth daily. Swallow whole.   CHOLECALCIFEROL (VITAMIN D3) 250 MCG (10000 UT) CAPSULE    Take 4,000 Units by mouth daily.   ETANERCEPT (ENBREL) 50 MG/ML INJECTION    Inject 50 mg into the skin every Sunday.    EZETIMIBE  (ZETIA ) 10 MG TABLET    TAKE 1 TABLET BY MOUTH DAILY   FLUOCINONIDE OINTMENT (LIDEX) 0.05 %    Apply 1 Application topically daily as needed.   HYDROCODONE -ACETAMINOPHEN  (NORCO/VICODIN) 5-325 MG TABLET    Take 1 tablet  by mouth daily.   LEVOTHYROXINE  (SYNTHROID ) 75 MCG TABLET    TAKE 1 TABLET BY MOUTH DAILY  BEFORE BREAKFAST   MECLIZINE  (ANTIVERT ) 12.5 MG TABLET    Take 1 tablet (12.5 mg total) by mouth 3 (three) times daily as needed for dizziness.  Modified Medications   No medications on file  Discontinued Medications   No medications on file    Physical Exam: There were no vitals filed for this visit. There is no height or weight on file to calculate BMI. BP Readings from Last 3 Encounters:  02/01/24 116/70  01/06/24 118/60  12/21/23 121/78   Wt Readings from Last 3 Encounters:  02/01/24 143 lb 9.6 oz (65.1 kg)  01/06/24 142 lb 12.8 oz (64.8 kg)  12/21/23 141 lb (64 kg)    Physical Exam Constitutional:      Appearance: Normal appearance.  HENT:     Head: Normocephalic and atraumatic.  Cardiovascular:     Rate and Rhythm: Normal rate and regular rhythm.  Pulmonary:     Effort: Pulmonary effort is normal. No respiratory distress.     Breath sounds: Normal breath  sounds. No wheezing.  Abdominal:     General: Bowel sounds are normal. There is no distension.     Tenderness: There is no abdominal tenderness. There is no guarding or rebound.     Comments:    Musculoskeletal:        General: No swelling.  Neurological:     Mental Status: She is alert. Mental status is at baseline.     Motor: No weakness.     Comments: Gait normal  Rombergs positive Finger nose neg No nystagmus Heel to gin negative Strength intact      Labs reviewed: Basic Metabolic Panel: Recent Labs    10/12/23 1111  NA 141  K 4.4  CL 105  CO2 26  GLUCOSE 102  BUN 15  CREATININE 0.91  CALCIUM 10.0  TSH 2.09   Liver Function Tests: Recent Labs    10/12/23 1111  AST 21  ALT 14  BILITOT 0.4  PROT 7.7   No results for input(s): "LIPASE", "AMYLASE" in the last 8760 hours. No results for input(s): "AMMONIA" in the last 8760 hours. CBC: Recent Labs    05/20/23 1056 10/12/23 1111  WBC 6.5 7.7   NEUTROABS 4.0 5,090  HGB 14.9 14.4  HCT 46.8* 45.7*  MCV 84.8 85.1  PLT 211 278   Lipid Panel: Recent Labs    10/12/23 1111  CHOL 227*  HDL 53  LDLCALC 136*  TRIG 233*  CHOLHDL 4.3   TSH: Recent Labs    10/12/23 1111  TSH 2.09   A1C: No results found for: "HGBA1C"  Assessment and Plan Assessment & Plan  1. Dizziness (Primary) Had MRI  No acute intracranial findings.  Moderate chronic microvascular ischemic changes. Neuro exam- rombergs positive Pt tried vestibular rehab in the past  Will refer to neurology - Ambulatory referral to Neurology  2. Hypothyroidism due to acquired atrophy of thyroid  Lab Results  Component Value Date   TSH 2.09 10/12/2023     3. Primary osteoarthritis involving multiple joints 4. Psoriatic arthritis (HCC) Informed patient that hydrocodone  can also contribute to worsening dizziness Limit hydrocodone  and take tylenol  prn for pain    40 min  Total time spent for obtaining history,  performing a medically appropriate examination and evaluation, reviewing the tests,  documenting clinical information in the electronic or other health record, independently interpreting results ,care coordination (not separately reported)

## 2024-03-15 NOTE — Telephone Encounter (Signed)
 Copied from CRM [id number]. Topic: General - Other >> Mar 14, 2024  4:52 PM Adrianna P wrote: Reason for CRM: patient is not happy that her hydrocodone  still has not been received by pharmacy.   It appears rx was sent to the wrong pharmacy. Spoke with patient and she would like rx sent to [facility]

## 2024-03-15 NOTE — Telephone Encounter (Signed)
 noted

## 2024-03-31 DIAGNOSIS — H35373 Puckering of macula, bilateral: Secondary | ICD-10-CM | POA: Diagnosis not present

## 2024-03-31 DIAGNOSIS — G43B0 Ophthalmoplegic migraine, not intractable: Secondary | ICD-10-CM | POA: Diagnosis not present

## 2024-03-31 DIAGNOSIS — H40022 Open angle with borderline findings, high risk, left eye: Secondary | ICD-10-CM | POA: Diagnosis not present

## 2024-03-31 DIAGNOSIS — H401111 Primary open-angle glaucoma, right eye, mild stage: Secondary | ICD-10-CM | POA: Diagnosis not present

## 2024-04-06 ENCOUNTER — Encounter (INDEPENDENT_AMBULATORY_CARE_PROVIDER_SITE_OTHER): Payer: Self-pay | Admitting: Otolaryngology

## 2024-04-06 ENCOUNTER — Ambulatory Visit (INDEPENDENT_AMBULATORY_CARE_PROVIDER_SITE_OTHER): Admitting: Otolaryngology

## 2024-04-06 VITALS — BP 145/81 | HR 107

## 2024-04-06 DIAGNOSIS — R42 Dizziness and giddiness: Secondary | ICD-10-CM | POA: Diagnosis not present

## 2024-04-06 DIAGNOSIS — H9313 Tinnitus, bilateral: Secondary | ICD-10-CM | POA: Diagnosis not present

## 2024-04-07 ENCOUNTER — Other Ambulatory Visit: Payer: Self-pay | Admitting: Family Medicine

## 2024-04-08 DIAGNOSIS — H9313 Tinnitus, bilateral: Secondary | ICD-10-CM | POA: Insufficient documentation

## 2024-04-08 NOTE — Progress Notes (Signed)
 CC: Recurrent dizziness, bilateral tinnitus  HPI:  Sydney Reid is a 83 y.o. female who presents today complaining of recurrent dizziness for the past 3 to 4 years.  She describes the dizziness as an off-balance and lightheaded sensation.  She is symptomatic on a daily basis.  Her MRI scan in May 2025 was negative for significant intracranial abnormality.  She was treated by physical therapy earlier this year.  She denies any otalgia or otorrhea.  She underwent adenotonsillectomy surgery as a child.  She has no other ENT surgery.  She has a history of multiple risk factors, including cervical spine disease, artificial heart valve, and frequent tachycardia.  She has significant psoriatic arthritis, resulting in severe back pain.  Past Medical History:  Diagnosis Date   Complication of anesthesia    Difficult intubation    20-25 years ago   H/O total knee replacement 10/28/1999   Right   High cholesterol    History of tonsillectomy and adenoidectomy 10/27/1944   History of transcatheter aortic valve replacement (TAVR) 08/06/2021   s/p Edwards 23mm S3 vis TF approach with Dr. Arlester Ladd and Dr. Sherene Dilling   Psoriatic arthritis [facility]) 10/27/1988   Sleep apnea with use of continuous positive airway pressure (CPAP) 10/27/2002   Thyroid  disease     Past Surgical History:  Procedure Laterality Date   CARDIAC CATHETERIZATION     CATARACT EXTRACTION W/ INTRAOCULAR LENS IMPLANT Bilateral    FOOT SURGERY     fused arch in left foot   GROIN DISSECTION  08/06/2021   Procedure: Arnetha Bhat EXPLORATION ;  Surgeon: Adine Hoof, MD;  Location: [facility] OR;  Service: Vascular;;   INTRAOPERATIVE TRANSTHORACIC ECHOCARDIOGRAM N/A 08/06/2021   Procedure: INTRAOPERATIVE TRANSTHORACIC ECHOCARDIOGRAM;  Surgeon: Kyra Phy, MD;  Location: [facility] OR;  Service: Cardiovascular;  Laterality: N/A;   LOWER EXTREMITY ANGIOGRAM  08/06/2021   Procedure: LEFT LOWER EXTREMITY ANGIOGRAM;  Surgeon: Kyra Phy, MD;   Location: [facility] OR;  Service: Cardiovascular;;   PATCH ANGIOPLASTY  08/06/2021   Procedure: PATCH ANGIOPLASTY OF FEMORAL ARTERY USING LEFT GREATER SAPHENOUS VEIN;  Surgeon: Adine Hoof, MD;  Location: [facility] OR;  Service: Vascular;;   RIGHT/LEFT HEART CATH AND CORONARY ANGIOGRAPHY N/A 04/16/2021   Procedure: RIGHT/LEFT HEART CATH AND CORONARY ANGIOGRAPHY;  Surgeon: Knox Perl, MD;  [facility] INVASIVE CV LAB;  Service: Cardiovascular;  Laterality: N/A;   SPINE SURGERY     TONSILLECTOMY     TOTAL KNEE ARTHROPLASTY Left    TRANSCATHETER AORTIC VALVE REPLACEMENT, TRANSFEMORAL N/A 08/06/2021   Procedure: TRANSCATHETER AORTIC VALVE REPLACEMENT, TRANSFEMORAL;  Surgeon: Thukkani, Arun K, MD;  Location: [facility] OR;  Service: Cardiovascular;  Laterality: N/A;    Family History  Problem Relation Age of Onset   Heart attack Father    Heart disease Sister     Social History:  reports that she quit smoking about 50 years ago. Her smoking use included cigarettes. She started smoking about 68 years ago. She has a 36 pack-year smoking history. She has never used smokeless tobacco. She reports that she does not currently use alcohol. She reports that she does not use drugs.  Allergies:  Allergies  Allergen Reactions   Codeine Nausea And Vomiting    Weakness and dysequilibrium   Statins Hives    Myalgias     Prior to Admission medications   Medication Sig Start Date End Date Taking? Authorizing Provider  aspirin  EC 81 MG tablet Take 81 mg by mouth daily. Swallow whole.  Yes [provider]  Cholecalciferol (VITAMIN D3) 250 MCG (10000 UT) capsule Take 4,000 Units by mouth daily.   Yes [provider]  etanercept (ENBREL) 50 MG/ML injection Inject 50 mg into the skin every Sunday.    Yes [provider]  ezetimibe  (ZETIA ) 10 MG tablet TAKE 1 TABLET BY MOUTH DAILY 01/11/24  Yes Eubanks, Jessica K, NP  fluocinonide ointment (LIDEX) 0.05 % Apply 1 Application topically  daily as needed. 02/11/23  Yes [provider]  HYDROcodone -acetaminophen  (NORCO/VICODIN) 5-325 MG tablet Take 1 tablet by mouth daily. 03/15/24  Yes Verma Gobble, NP  levothyroxine  (SYNTHROID ) 75 MCG tablet TAKE 1 TABLET BY MOUTH DAILY  BEFORE BREAKFAST 06/24/23  Yes Eubanks, Jessica K, NP  meclizine  (ANTIVERT ) 12.5 MG tablet Take 1 tablet (12.5 mg total) by mouth 3 (three) times daily as needed for dizziness. 02/01/24  Yes Ngetich, Dinah C, NP    Blood pressure (!) 145/81, pulse (!) 107, SpO2 96%. Exam: General: Communicates without difficulty, well nourished, no acute distress. Head: Normocephalic, no evidence injury, no tenderness, facial buttresses intact without stepoff. Face/sinus: No tenderness to palpation and percussion. Facial movement is normal and symmetric. Eyes: PERRL, EOMI. No scleral icterus, conjunctivae clear. Neuro: CN II exam reveals vision grossly intact.  No nystagmus at any point of gaze. Ears: Auricles well formed without lesions.  Ear canals are intact without mass or lesion.  No erythema or edema is appreciated.  The TMs are intact without fluid. Nose: External evaluation reveals normal support and skin without lesions.  Dorsum is intact.  Anterior rhinoscopy reveals congested mucosa over anterior aspect of inferior turbinates and intact septum.  No purulence noted. Oral:  Oral cavity and oropharynx are intact, symmetric, without erythema or edema.  Mucosa is moist without lesions. Neck: Full range of motion without pain.  There is no significant lymphadenopathy.  No masses palpable.  Thyroid  bed within normal limits to palpation.  Parotid glands and submandibular glands equal bilaterally without mass.  Trachea is midline. Neuro:  CN 2-12 grossly intact. Vestibular: No nystagmus at any point of gaze. Dix Hallpike negative.  Vestibular: There is no nystagmus with pneumatic pressure on either tympanic membrane or Valsalva. The cerebellar examination is unremarkable.    Assessment: 1.  Recurrent dizziness, likely secondary to multifactorial causes.  The patient has multiple risk factors for being imbalance, including severe spine disease, artificial heart valve, frequent tachycardia, and general deconditioning.  Other possible differential diagnoses include transient BPPV, vestibular migraine, Meniere's disease, peripheral vestibular dysfunction, or other central/systemic causes.  2.  Her ear canals, tympanic membranes, and middle ear spaces are normal.  No middle ear effusion or infection is noted.  Her Dix-Hallpike maneuver is negative. 3.  Bilateral subjective tinnitus.  Plan: 1.  The physical exam findings are reviewed with the patient. 2.  The pathophysiology of vestibular dysfunction and dizziness are discussed extensively with the patient. The possible differential diagnoses are reviewed. Questions are invited and answered.  3.  The patient should continue with her vestibular/balance exercises at home.  The importance of consistency is discussed with the patient. 4.  If the patient continues to be symptomatic, she may benefit from vestibular neurodiagnostic testing at a tertiary care center to evaluate for possible vestibular dysfunction.    Nevah Dalal W Con Arganbright 04/08/2024, 2:30 PM

## 2024-04-11 ENCOUNTER — Telehealth: Payer: Self-pay | Admitting: Neurology

## 2024-04-11 ENCOUNTER — Telehealth: Payer: Self-pay

## 2024-04-11 NOTE — Telephone Encounter (Signed)
 I spoke to the patient and redirected the neurosurgery referral.

## 2024-04-11 NOTE — Telephone Encounter (Signed)
 Patient cancelled appointment have been referred to someone can be seen sooner.

## 2024-04-11 NOTE — Progress Notes (Deleted)
 Referring Physician:  Verma Gobble, NP [address] [place],  [place] [address]  Primary Physician:  Verma Gobble, NP  History of Present Illness: 04/11/2024*** Ms. Opaline Reyburn has a history of ***  Abnormal Cervical MRI  Neck pain?    Duration: *** Location: *** Quality: *** Severity: ***  Precipitating: aggravated by *** Modifying factors: made better by *** Weakness: none Timing: *** Bowel/Bladder Dysfunction: none  Conservative measures:  Physical therapy: has participated in PT for unsteady gait at [facility] Multimodal medical therapy including regular antiinflammatories: Hydrocodone   Injections: no epidural steroid injections  Past Surgery: ACDF C4-C7  Jearline Minder has ***no symptoms of cervical myelopathy.  The symptoms are causing a significant impact on the patient's life.   Review of Systems:  A 10 point review of systems is negative, except for the pertinent positives and negatives detailed in the HPI.  Past Medical History: Past Medical History:  Diagnosis Date   Complication of anesthesia    Difficult intubation    20-25 years ago   H/O total knee replacement 10/28/1999   Right   High cholesterol    History of tonsillectomy and adenoidectomy 10/27/1944   History of transcatheter aortic valve replacement (TAVR) 08/06/2021   s/p Edwards 23mm S3 vis TF approach with Dr. Arlester Ladd and Dr. Sherene Dilling   Psoriatic arthritis [facility]) 10/27/1988   Sleep apnea with use of continuous positive airway pressure (CPAP) 10/27/2002   Thyroid  disease     Past Surgical History: Past Surgical History:  Procedure Laterality Date   CARDIAC CATHETERIZATION     CATARACT EXTRACTION W/ INTRAOCULAR LENS IMPLANT Bilateral    FOOT SURGERY     fused arch in left foot   GROIN DISSECTION  08/06/2021   Procedure: Arnetha Bhat EXPLORATION ;  Surgeon: Adine Hoof, MD;  Location: [facility] OR;  Service: Vascular;;   INTRAOPERATIVE TRANSTHORACIC ECHOCARDIOGRAM N/A  08/06/2021   Procedure: INTRAOPERATIVE TRANSTHORACIC ECHOCARDIOGRAM;  Surgeon: Kyra Phy, MD;  Location: [facility];  Service: Cardiovascular;  Laterality: N/A;   LOWER EXTREMITY ANGIOGRAM  08/06/2021   Procedure: LEFT LOWER EXTREMITY ANGIOGRAM;  Surgeon: Kyra Phy, MD;  Location: [facility] OR;  Service: Cardiovascular;;   PATCH ANGIOPLASTY  08/06/2021   Procedure: PATCH ANGIOPLASTY OF FEMORAL ARTERY USING LEFT GREATER SAPHENOUS VEIN;  Surgeon: Adine Hoof, MD;  Location: [facility] OR;  Service: Vascular;;   RIGHT/LEFT HEART CATH AND CORONARY ANGIOGRAPHY N/A 04/16/2021   Procedure: RIGHT/LEFT HEART CATH AND CORONARY ANGIOGRAPHY;  Surgeon: Knox Perl, MD;  [facility] INVASIVE CV LAB;  Service: Cardiovascular;  Laterality: N/A;   SPINE SURGERY     TONSILLECTOMY     TOTAL KNEE ARTHROPLASTY Left    TRANSCATHETER AORTIC VALVE REPLACEMENT, TRANSFEMORAL N/A 08/06/2021   Procedure: TRANSCATHETER AORTIC VALVE REPLACEMENT, TRANSFEMORAL;  Surgeon: Kyra Phy, MD;  Location: [facility] OR;  Service: Cardiovascular;  Laterality: N/A;    Allergies: Allergies as of 04/13/2024 - Review Complete 04/06/2024  Allergen Reaction Noted   Codeine Nausea And Vomiting 01/21/2016   Statins Hives 05/02/2016    Medications: Outpatient Encounter Medications as of 04/13/2024  Medication Sig   aspirin  EC 81 MG tablet Take 81 mg by mouth daily. Swallow whole.   Cholecalciferol (VITAMIN D3) 250 MCG (10000 UT) capsule Take 4,000 Units by mouth daily.   etanercept (ENBREL) 50 MG/ML injection Inject 50 mg into the skin every Sunday.    ezetimibe  (ZETIA ) 10 MG tablet TAKE 1 TABLET BY MOUTH DAILY   fluocinonide ointment (  LIDEX) 0.05 % Apply 1 Application topically daily as needed.   HYDROcodone -acetaminophen  (NORCO/VICODIN) 5-325 MG tablet Take 1 tablet by mouth daily.   levothyroxine  (SYNTHROID ) 75 MCG tablet TAKE 1 TABLET BY MOUTH DAILY  BEFORE BREAKFAST   meclizine  (ANTIVERT ) 12.5 MG tablet Take 1 tablet (12.5  mg total) by mouth 3 (three) times daily as needed for dizziness.   No facility-administered encounter medications on file as of 04/13/2024.    Social History: Social History   Tobacco Use   Smoking status: Former    Current packs/day: 0.00    Average packs/day: 2.0 packs/day for 18.0 years (36.0 ttl pk-yrs)    Types: Cigarettes    Start date: 80    Quit date: 86    Years since quitting: 50.4   Smokeless tobacco: Never  Vaping Use   Vaping status: Never Used  Substance Use Topics   Alcohol use: Not Currently    Comment: Glass of wine daily   Drug use: No    Family Medical History: Family History  Problem Relation Age of Onset   Heart attack Father    Heart disease Sister     Physical Examination: There were no vitals filed for this visit.  General: Patient is well developed, well nourished, calm, collected, and in no apparent distress. Attention to examination is appropriate.  Respiratory: Patient is breathing without any difficulty.   NEUROLOGICAL:     Awake, alert, oriented to person, place, and time.  Speech is clear and fluent. Fund of knowledge is appropriate.   Cranial Nerves: Pupils equal round and reactive to light.  Facial tone is symmetric.    *** ROM of cervical spine *** pain *** posterior cervical tenderness. *** tenderness in bilateral trapezial region.   *** ROM of lumbar spine *** pain *** posterior lumbar tenderness.   No abnormal lesions on exposed skin.   Strength: Side Biceps Triceps Deltoid Interossei Grip Wrist Ext. Wrist Flex.  R 5 5 5 5 5 5 5   L 5 5 5 5 5 5 5    Side Iliopsoas Quads Hamstring PF DF EHL  R 5 5 5 5 5 5   L 5 5 5 5 5 5    Reflexes are ***2+ and symmetric at the biceps, brachioradialis, patella and achilles.   Hoffman's is absent.  Clonus is not present.   Bilateral upper and lower extremity sensation is intact to light touch.     Gait is normal.   ***No difficulty with tandem gait.    Medical Decision  Making  Imaging: ***  I have personally reviewed the images and agree with the above interpretation.  Assessment and Plan: Ms. Sciara is a pleasant 83 y.o. female has ***  Treatment options discussed with patient and following plan made:   - Order for physical therapy for *** spine ***. Patient to call to schedule appointment. *** - Continue current medications including ***. Reviewed dosing and side effects.  - Prescription for ***. Reviewed dosing and side effects. Take with food.  - Prescription for *** to take prn muscle spasms. Reviewed dosing and side effects. Discussed this can cause drowsiness.  - MRI of *** to further evaluate *** radiculopathy. No improvement time or medications (***).  - Referral to PMR at [facility] to discuss possible *** injections.  - Will schedule phone visit to review MRI results once I get them back.   I spent a total of *** minutes in face-to-face and non-face-to-face activities related to this patient's care today including  review of outside records, review of imaging, review of symptoms, physical exam, discussion of differential diagnosis, discussion of treatment options, and documentation.   Thank you for involving me in the care of this patient.   Lucetta Russel PA-C Dept. of Neurosurgery

## 2024-04-11 NOTE — Telephone Encounter (Signed)
 Copied from CRM [id number]. Topic: Referral - Status >> Apr 11, 2024 11:17 AM Retta Caster wrote: Reason for CRM: Ambulatory referral to Neurology (Order # [id number]) on 03/15/2024-Patient requesting for another location local doe snot want to drive out to [place]. Also no info for her to call needs to cancel her app for 06/18 for Neurologist Needs call back  [phone]

## 2024-04-12 ENCOUNTER — Encounter: Payer: Self-pay | Admitting: Neurology

## 2024-04-12 ENCOUNTER — Ambulatory Visit (INDEPENDENT_AMBULATORY_CARE_PROVIDER_SITE_OTHER): Payer: Self-pay | Admitting: Neurology

## 2024-04-12 DIAGNOSIS — Z0289 Encounter for other administrative examinations: Secondary | ICD-10-CM

## 2024-04-12 NOTE — Progress Notes (Signed)
 Please note that the patient did not mean to keep this appointment.  As I walked into the room and started talking to the patient and the reason for referral, she reports that she called and canceled yesterday's appointment (which was scheduled with Dr. Samara Crest in our office), but for some reason another appointment was made, which she states that she did not mean to make or keep. She feels that it would be a waste of money, she does not want to keep today's appointment and is therefore offered a cancellation, I will not charge her for today's visit.  She reports that she has had dizziness off and on for 5 years and has seen multiple providers and has had multiple tests done already.

## 2024-04-13 ENCOUNTER — Ambulatory Visit: Admitting: Orthopedic Surgery

## 2024-04-13 NOTE — Telephone Encounter (Signed)
 Copied from CRM [id number]. Topic: Referral - Status >> Apr 11, 2024 11:17 AM Sydney Reid wrote: Reason for CRM: Ambulatory referral to Neurology (Order # [id number]) on 03/15/2024-Patient requesting for another location local doe snot want to drive out to [place]. Also no info for her to call needs to cancel her app for 06/18 for Neurologist Needs call back  [phone] >> Apr 13, 2024 10:46 AM Sydney Reid wrote: Patient calling because she has not heard back, she would like to be referred to [facility] to Dr. Isadora Mar.  Address: [address], [place], [place] [address] Phone: [phone]  Patient is frustrated with this referral, please contact with an update. Thanks!

## 2024-04-18 ENCOUNTER — Ambulatory Visit (INDEPENDENT_AMBULATORY_CARE_PROVIDER_SITE_OTHER): Payer: Medicare Other | Admitting: Nurse Practitioner

## 2024-04-18 ENCOUNTER — Encounter: Payer: Self-pay | Admitting: Nurse Practitioner

## 2024-04-18 VITALS — BP 118/80 | HR 80 | Temp 97.0°F | Ht 64.0 in | Wt 142.0 lb

## 2024-04-18 DIAGNOSIS — R42 Dizziness and giddiness: Secondary | ICD-10-CM | POA: Diagnosis not present

## 2024-04-18 DIAGNOSIS — L4050 Arthropathic psoriasis, unspecified: Secondary | ICD-10-CM

## 2024-04-18 DIAGNOSIS — F321 Major depressive disorder, single episode, moderate: Secondary | ICD-10-CM | POA: Insufficient documentation

## 2024-04-18 DIAGNOSIS — E782 Mixed hyperlipidemia: Secondary | ICD-10-CM

## 2024-04-18 DIAGNOSIS — E034 Atrophy of thyroid (acquired): Secondary | ICD-10-CM | POA: Diagnosis not present

## 2024-04-18 DIAGNOSIS — Z79899 Other long term (current) drug therapy: Secondary | ICD-10-CM | POA: Diagnosis not present

## 2024-04-18 DIAGNOSIS — D45 Polycythemia vera: Secondary | ICD-10-CM

## 2024-04-18 DIAGNOSIS — M4802 Spinal stenosis, cervical region: Secondary | ICD-10-CM | POA: Diagnosis not present

## 2024-04-18 DIAGNOSIS — N1831 Chronic kidney disease, stage 3a: Secondary | ICD-10-CM | POA: Diagnosis not present

## 2024-04-18 MED ORDER — HYDROCODONE-ACETAMINOPHEN 5-325 MG PO TABS: 1.0000 | ORAL_TABLET | Freq: Every day | ORAL | 0 refills | Status: DC

## 2024-04-18 NOTE — Assessment & Plan Note (Signed)
 Does not wish to be on statin due to side effect with already chronic pain Continues on zetia  and dietary modifications

## 2024-04-18 NOTE — Assessment & Plan Note (Signed)
 No significant pain or neuropathy noted

## 2024-04-18 NOTE — Assessment & Plan Note (Signed)
 Stable at this time, not currently on any medication. Continues with lifestyle modifications

## 2024-04-18 NOTE — Assessment & Plan Note (Signed)
 Hgb has been stable, followed by hematology, will make follow up appt

## 2024-04-18 NOTE — Assessment & Plan Note (Signed)
 Ongoing, likely multifactorial. Plans to meet with neurosurgery to discuss spinal stenosis

## 2024-04-18 NOTE — Assessment & Plan Note (Signed)
 Continues on synthroid  75 mcg  TSH at goal in December

## 2024-04-18 NOTE — Assessment & Plan Note (Signed)
 Chronic and stable Encourage proper hydration Follow metabolic panel Avoid nephrotoxic meds (NSAIDS)

## 2024-04-18 NOTE — Assessment & Plan Note (Signed)
 Ongoing, continues to follow up with rheumatology and managed with enbrel

## 2024-04-18 NOTE — Progress Notes (Signed)
 Careteam: Patient Care Team: [name], NP as PCP - General (Geriatric Medicine) Ladona Heinz, MD as PCP - Cardiology (Cardiology) Fernando Dale Betters, MD as Referring Physician (Ophthalmology) Curt Lighter, MD as Consulting Physician (Rheumatology)  PLACE OF SERVICE:  [facility]  Advanced Directive information    Allergies  Allergen Reactions   Codeine Nausea And Vomiting    Weakness and dysequilibrium   Statins Hives    Myalgias     Chief Complaint  Patient presents with   Follow-up    6 month follow up.  Patient has concerns about her referral for [facility]     HPI:  Discussed the use of AI scribe software for clinical note transcription with the patient, who gave verbal consent to proceed.  History of Present Illness Sydney Reid is an 83 year old female with severe stenosis who presents with dizziness.  She experiences ongoing dizziness, which she attributes to her spine. The dizziness occurs when she sits up and is described as 'unbelievable'. Bridge exercises alleviate pain when lying down, but dizziness persists upon sitting up. She has consulted neurology and ENT, with ENT finding no issues.  She is currently taking hydrocodone  and requires a refill at [facility]. A previous issue with the prescription being sent to the wrong pharmacy caused a two-week delay in receiving her medication.  She has a history of polycythemia vera and has not seen her hematologist in over six months, previously hgb had been stable.   Her dietary habits include high sugar intake, and she acknowledges not eating as she should. She eats salmon once a week but otherwise relies on convenience foods like sugar and potato chips. She has a history of high cholesterol and has previously taken statins and Zetia , experiencing adverse effects with statins.  She notes a change in bowel habits, with less frequent bowel movements, which she attributes to a  lack of dietary fiber. She used to have daily bowel movements but now experiences irregularity. Does not feel like she has constipation.   Review of Systems:  Review of Systems  Constitutional:  Negative for chills, fever and weight loss.  HENT:  Negative for tinnitus.   Respiratory:  Negative for cough, sputum production and shortness of breath.   Cardiovascular:  Negative for chest pain, palpitations and leg swelling.  Gastrointestinal:  Negative for abdominal pain, constipation, diarrhea and heartburn.  Genitourinary:  Negative for dysuria, frequency and urgency.  Musculoskeletal:  Positive for joint pain and myalgias. Negative for back pain and falls.  Skin: Negative.   Neurological:  Negative for dizziness and headaches.  Psychiatric/Behavioral:  Negative for depression and memory loss. The patient does not have insomnia.     Past Medical History:  Diagnosis Date   Complication of anesthesia    Difficult intubation    20-25 years ago   H/O total knee replacement 10/28/1999   Right   High cholesterol    History of tonsillectomy and adenoidectomy 10/27/1944   History of transcatheter aortic valve replacement (TAVR) 08/06/2021   s/p Edwards 23mm S3 vis TF approach with Dr. Wonda and Dr. Lucas   Psoriatic arthritis [facility]) 10/27/1988   Sleep apnea with use of continuous positive airway pressure (CPAP) 10/27/2002   Thyroid  disease    Past Surgical History:  Procedure Laterality Date   CARDIAC CATHETERIZATION     CATARACT EXTRACTION W/ INTRAOCULAR LENS IMPLANT Bilateral    FOOT SURGERY     fused arch in left foot  GROIN DISSECTION  08/06/2021   Procedure: GROIN EXPLORATION ;  Surgeon: Sheree Penne Bruckner, MD;  Location[facility];  Service: Vascular;;   INTRAOPERATIVE TRANSTHORACIC ECHOCARDIOGRAM N/A 08/06/2021   Procedure: INTRAOPERATIVE TRANSTHORACIC ECHOCARDIOGRAM;  Surgeon: Wendel Lurena POUR, MD;  Location: [facility] OR;  Service: Cardiovascular;  Laterality: N/A;   LOWER  EXTREMITY ANGIOGRAM  08/06/2021   Procedure: LEFT LOWER EXTREMITY ANGIOGRAM;  Surgeon: Wendel Lurena POUR, MD;  Location: [facility] OR;  Service: Cardiovascular;;   PATCH ANGIOPLASTY  08/06/2021   Procedure: PATCH ANGIOPLASTY OF FEMORAL ARTERY USING LEFT GREATER SAPHENOUS VEIN;  Surgeon: Sheree Penne Bruckner, MD;  Location: [facility] OR;  Service: Vascular;;   RIGHT/LEFT HEART CATH AND CORONARY ANGIOGRAPHY N/A 04/16/2021   Procedure: RIGHT/LEFT HEART CATH AND CORONARY ANGIOGRAPHY;  Surgeon: Ladona Heinz, MD;  Location: [facility] INVASIVE CV LAB;  Service: Cardiovascular;  Laterality: N/A;   SPINE SURGERY     TONSILLECTOMY     TOTAL KNEE ARTHROPLASTY Left    TRANSCATHETER AORTIC VALVE REPLACEMENT, TRANSFEMORAL N/A 08/06/2021   Procedure: TRANSCATHETER AORTIC VALVE REPLACEMENT, TRANSFEMORAL;  Surgeon: Thukkani, Arun K, MD;  Location: [facility] OR;  Service: Cardiovascular;  Laterality: N/A;   Social History:   reports that she quit smoking about 50 years ago. Her smoking use included cigarettes. She started smoking about 68 years ago. She has a 36 pack-year smoking history. She has never used smokeless tobacco. She reports current alcohol use of about 8.0 standard drinks of alcohol per week. She reports that she does not use drugs.  Family History  Problem Relation Age of Onset   Heart attack Father    Heart disease Sister     Medications: Patient's Medications  New Prescriptions   No medications on file  Previous Medications   ASPIRIN  EC 81 MG TABLET    Take 81 mg by mouth daily. Swallow whole.   CHOLECALCIFEROL (VITAMIN D3) 250 MCG (10000 UT) CAPSULE    Take 4,000 Units by mouth daily.   ETANERCEPT (ENBREL) 50 MG/ML INJECTION    Inject 50 mg into the skin every Sunday.    EZETIMIBE  (ZETIA ) 10 MG TABLET    TAKE 1 TABLET BY MOUTH DAILY   FLUOCINONIDE OINTMENT (LIDEX) 0.05 %    Apply 1 Application topically daily as needed.   LEVOTHYROXINE  (SYNTHROID ) 75 MCG TABLET    TAKE 1 TABLET BY MOUTH DAILY  BEFORE BREAKFAST    MECLIZINE  (ANTIVERT ) 12.5 MG TABLET    Take 1 tablet (12.5 mg total) by mouth 3 (three) times daily as needed for dizziness.  Modified Medications   Modified Medication Previous Medication   HYDROCODONE -ACETAMINOPHEN  (NORCO/VICODIN) 5-325 MG TABLET HYDROcodone -acetaminophen  (NORCO/VICODIN) 5-325 MG tablet      Take 1 tablet by mouth daily.    Take 1 tablet by mouth daily.  Discontinued Medications   No medications on file    Physical Exam:  Vitals:   04/18/24 1031  BP: 118/80  Pulse: 80  Temp: (!) 97 F (36.1 C)  SpO2: 97%  Weight: 142 lb (64.4 kg)  Height: 5' 4 (1.626 m)   Body mass index is 24.37 kg/m. Wt Readings from Last 3 Encounters:  04/18/24 142 lb (64.4 kg)  04/12/24 141 lb 9.6 oz (64.2 kg)  03/15/24 140 lb 3.2 oz (63.6 kg)    Physical Exam Constitutional:      General: She is not in acute distress.    Appearance: She is well-developed. She is not diaphoretic.  HENT:     Head: Normocephalic and atraumatic.  Mouth/Throat:     Pharynx: No oropharyngeal exudate.   Eyes:     Conjunctiva/sclera: Conjunctivae normal.     Pupils: Pupils are equal, round, and reactive to light.    Cardiovascular:     Rate and Rhythm: Normal rate and regular rhythm.     Heart sounds: Normal heart sounds.  Pulmonary:     Effort: Pulmonary effort is normal.     Breath sounds: Normal breath sounds.  Abdominal:     General: Bowel sounds are normal.     Palpations: Abdomen is soft.   Musculoskeletal:        General: Deformity (due to arthritis) present.     Cervical back: Normal range of motion and neck supple.     Right lower leg: No edema.     Left lower leg: No edema.   Skin:    General: Skin is warm and dry.   Neurological:     Mental Status: She is alert.   Psychiatric:        Mood and Affect: Mood normal.     Labs reviewed: Basic Metabolic Panel: Recent Labs    10/12/23 1111  NA 141  K 4.4  CL 105  CO2 26  GLUCOSE 102  BUN 15  CREATININE 0.91   CALCIUM 10.0  TSH 2.09   Liver Function Tests: Recent Labs    10/12/23 1111  AST 21  ALT 14  BILITOT 0.4  PROT 7.7   No results for input(s): LIPASE, AMYLASE in the last 8760 hours. No results for input(s): AMMONIA in the last 8760 hours. CBC: Recent Labs    05/20/23 1056 10/12/23 1111  WBC 6.5 7.7  NEUTROABS 4.0 5,090  HGB 14.9 14.4  HCT 46.8* 45.7*  MCV 84.8 85.1  PLT 211 278   Lipid Panel: Recent Labs    10/12/23 1111  CHOL 227*  HDL 53  LDLCALC 136*  TRIG 233*  CHOLHDL 4.3   TSH: Recent Labs    10/12/23 1111  TSH 2.09   A1C: No results found for: HGBA1C   Assessment/Plan Psoriatic arthritis (HCC) Assessment & Plan: Ongoing, continues to follow up with rheumatology and managed with enbrel   Orders: -     HYDROcodone -Acetaminophen ; Take 1 tablet by mouth daily.  Dispense: 30 tablet; Refill: 0  Current moderate episode of major depressive disorder without prior episode [facility]) Assessment & Plan: Stable at this time, not currently on any medication. Continues with lifestyle modifications   Stage 3a chronic kidney disease (HCC) Assessment & Plan: Chronic and stable Encourage proper hydration Follow metabolic panel Avoid nephrotoxic meds (NSAIDS)   Orders: -     COMPLETE METABOLIC PANEL WITHOUT GFR  Polycythemia vera (HCC) Assessment & Plan: Hgb has been stable, followed by hematology, will make follow up appt   Orders: -     CBC with Differential/Platelet -     COMPLETE METABOLIC PANEL WITHOUT GFR  Spinal stenosis, cervical region Assessment & Plan: No significant pain or neuropathy noted  Orders: -     Ambulatory referral to Neurosurgery  Hypothyroidism due to acquired atrophy of thyroid  Assessment & Plan: Continues on synthroid  75 mcg  TSH at goal in December   Mixed hyperlipidemia Assessment & Plan: Does not wish to be on statin due to side effect with already chronic pain Continues on zetia  and dietary  modifications   Orders: -     Lipid panel  Dizziness Assessment & Plan: Ongoing, likely multifactorial. Plans to meet with neurosurgery to  discuss spinal stenosis        Return in about 6 months (around 10/18/2024) for routine follow up.  Asani Deniston K. Caro BODILY [facility] [phone]

## 2024-04-19 ENCOUNTER — Ambulatory Visit: Payer: Self-pay | Admitting: Nurse Practitioner

## 2024-04-19 LAB — CBC WITH DIFFERENTIAL/PLATELET
Absolute Lymphocytes: 1577 {cells}/uL (ref 850–3900)
Absolute Monocytes: 653 {cells}/uL (ref 200–950)
Basophils Absolute: 13 {cells}/uL (ref 0–200)
Basophils Relative: 0.2 %
Eosinophils Absolute: 0 {cells}/uL — ABNORMAL LOW (ref 15–500)
Eosinophils Relative: 0 %
HCT: 48.6 % — ABNORMAL HIGH (ref 35.0–45.0)
Hemoglobin: 14.8 g/dL (ref 11.7–15.5)
MCH: 26 pg — ABNORMAL LOW (ref 27.0–33.0)
MCHC: 30.5 g/dL — ABNORMAL LOW (ref 32.0–36.0)
MCV: 85.3 fL (ref 80.0–100.0)
MPV: 10.2 fL (ref 7.5–12.5)
Monocytes Relative: 9.9 %
Neutro Abs: 4356 {cells}/uL (ref 1500–7800)
Neutrophils Relative %: 66 %
Platelets: 216 10*3/uL (ref 140–400)
RBC: 5.7 10*6/uL — ABNORMAL HIGH (ref 3.80–5.10)
RDW: 13.4 % (ref 11.0–15.0)
Total Lymphocyte: 23.9 %
WBC: 6.6 10*3/uL (ref 3.8–10.8)

## 2024-04-19 LAB — COMPLETE METABOLIC PANEL WITHOUT GFR
AG Ratio: 1.3 (calc) (ref 1.0–2.5)
ALT: 11 U/L (ref 6–29)
AST: 18 U/L (ref 10–35)
Albumin: 4.5 g/dL (ref 3.6–5.1)
Alkaline phosphatase (APISO): 73 U/L (ref 37–153)
BUN/Creatinine Ratio: 15 (calc) (ref 6–22)
BUN: 15 mg/dL (ref 7–25)
CO2: 25 mmol/L (ref 20–32)
Calcium: 9.6 mg/dL (ref 8.6–10.4)
Chloride: 106 mmol/L (ref 98–110)
Creat: 1.02 mg/dL — ABNORMAL HIGH (ref 0.60–0.95)
Globulin: 3.5 g/dL (ref 1.9–3.7)
Glucose, Bld: 88 mg/dL (ref 65–139)
Potassium: 4.3 mmol/L (ref 3.5–5.3)
Sodium: 141 mmol/L (ref 135–146)
Total Bilirubin: 0.6 mg/dL (ref 0.2–1.2)
Total Protein: 8 g/dL (ref 6.1–8.1)

## 2024-04-19 LAB — LIPID PANEL
Cholesterol: 198 mg/dL (ref <200)
HDL: 59 mg/dL (ref >=50)
LDL Cholesterol (Calc): 115 mg/dL — ABNORMAL HIGH
Non-HDL Cholesterol (Calc): 139 mg/dL — ABNORMAL HIGH (ref <130)
Total CHOL/HDL Ratio: 3.4 (calc) (ref <5.0)
Triglycerides: 128 mg/dL (ref <150)

## 2024-04-19 LAB — SEDIMENTATION RATE: Sed Rate: 6 mm/h (ref 0–30)

## 2024-04-19 LAB — C-REACTIVE PROTEIN: CRP: <3 mg/L (ref <8.0)

## 2024-04-25 ENCOUNTER — Telehealth: Payer: Self-pay | Admitting: Internal Medicine

## 2024-04-25 NOTE — Telephone Encounter (Signed)
 Left the patient a voicemail with the scheduled appointment details on her mobil number as I was unable to leave a voicemail on the home phone.

## 2024-05-04 NOTE — Progress Notes (Deleted)
 [facility] OFFICE PROGRESS NOTE  [name], NP [address] [place] [address]  DIAGNOSIS: Polycythemia vera with positive JAK2 mutation diagnosed in April 2022   PRIOR THERAPY: None  CURRENT THERAPY: Phlebotomy on as-needed basis    INTERVAL HISTORY: Sydney Reid 83 y.o. female returns to the clinic today for a follow up visit. She was last seen 1 year ago with Dr. Sherrod. She is followed for polycythemia vera.  Dr. Sherrod recommended a 9-month follow-up visit but the patient was lost to follow-up since that time.  Is feeling fairly well today.  She denies any fever, chills, night sweats, or unexplained weight loss.  She denies any chest pain, shortness of breath, cough, or hemoptysis.  She denies any headaches or flushing.  Dizzy spells?  She denies any abnormal bleeding or bruising.  She denies any recent dehydration.  She denies any nausea, vomiting, diarrhea, or constipation.  She is here today for evaluation and repeat blood work.      MEDICAL HISTORY: Past Medical History:  Diagnosis Date   Complication of anesthesia    Difficult intubation    20-25 years ago   H/O total knee replacement 10/28/1999   Right   High cholesterol    History of tonsillectomy and adenoidectomy 10/27/1944   History of transcatheter aortic valve replacement (TAVR) 08/06/2021   s/p Edwards 23mm S3 vis TF approach with Dr. Wonda and Dr. Lucas   Psoriatic arthritis [facility]) 10/27/1988   Sleep apnea with use of continuous positive airway pressure (CPAP) 10/27/2002   Thyroid  disease     ALLERGIES:  is allergic to codeine and statins.  MEDICATIONS:  Current Outpatient Medications  Medication Sig Dispense Refill   aspirin  EC 81 MG tablet Take 81 mg by mouth daily. Swallow whole.     Cholecalciferol (VITAMIN D3) 250 MCG (10000 UT) capsule Take 4,000 Units by mouth daily.     etanercept (ENBREL) 50 MG/ML injection Inject 50 mg into the skin every Sunday.       ezetimibe  (ZETIA ) 10 MG tablet TAKE 1 TABLET BY MOUTH DAILY 90 tablet 3   fluocinonide ointment (LIDEX) 0.05 % Apply 1 Application topically daily as needed.     HYDROcodone -acetaminophen  (NORCO/VICODIN) 5-325 MG tablet Take 1 tablet by mouth daily. 30 tablet 0   levothyroxine  (SYNTHROID ) 75 MCG tablet TAKE 1 TABLET BY MOUTH DAILY  BEFORE BREAKFAST 90 tablet 3   meclizine  (ANTIVERT ) 12.5 MG tablet Take 1 tablet (12.5 mg total) by mouth 3 (three) times daily as needed for dizziness. 30 tablet 0   No current facility-administered medications for this visit.    SURGICAL HISTORY:  Past Surgical History:  Procedure Laterality Date   CARDIAC CATHETERIZATION     CATARACT EXTRACTION W/ INTRAOCULAR LENS IMPLANT Bilateral    FOOT SURGERY     fused arch in left foot   GROIN DISSECTION  08/06/2021   Procedure: GROIN EXPLORATION ;  Surgeon: Sheree Penne Bruckner, MD;  Location: [facility] OR;  Service: Vascular;;   INTRAOPERATIVE TRANSTHORACIC ECHOCARDIOGRAM N/A 08/06/2021   Procedure: INTRAOPERATIVE TRANSTHORACIC ECHOCARDIOGRAM;  Surgeon: Wendel Lurena POUR, MD;  [facility] OR;  Service: Cardiovascular;  Laterality: N/A;   LOWER EXTREMITY ANGIOGRAM  08/06/2021   Procedure: LEFT LOWER EXTREMITY ANGIOGRAM;  Surgeon: Thukkani, Arun K, MD;  Location: [facility] OR;  Service: Cardiovascular;;   PATCH ANGIOPLASTY  08/06/2021   Procedure: PATCH ANGIOPLASTY OF FEMORAL ARTERY USING LEFT GREATER SAPHENOUS VEIN;  Surgeon: Sheree Penne Bruckner, MD;  Location: [facility] OR;  Service: Vascular;;   RIGHT/LEFT HEART CATH AND CORONARY ANGIOGRAPHY N/A 04/16/2021   Procedure: RIGHT/LEFT HEART CATH AND CORONARY ANGIOGRAPHY;  Surgeon: Ladona Heinz, MD;  [facility] INVASIVE CV LAB;  Service: Cardiovascular;  Laterality: N/A;   SPINE SURGERY     TONSILLECTOMY     TOTAL KNEE ARTHROPLASTY Left    TRANSCATHETER AORTIC VALVE REPLACEMENT, TRANSFEMORAL N/A 08/06/2021   Procedure: TRANSCATHETER AORTIC VALVE REPLACEMENT, TRANSFEMORAL;   Surgeon: Wendel Lurena POUR, MD;  Location: [facility] OR;  Service: Cardiovascular;  Laterality: N/A;    REVIEW OF SYSTEMS:   Review of Systems  Constitutional: Negative for appetite change, chills, fatigue, fever and unexpected weight change.  HENT:   Negative for mouth sores, nosebleeds, sore throat and trouble swallowing.   Eyes: Negative for eye problems and icterus.  Respiratory: Negative for cough, hemoptysis, shortness of breath and wheezing.   Cardiovascular: Negative for chest pain and leg swelling.  Gastrointestinal: Negative for abdominal pain, constipation, diarrhea, nausea and vomiting.  Genitourinary: Negative for bladder incontinence, difficulty urinating, dysuria, frequency and hematuria.   Musculoskeletal: Negative for back pain, gait problem, neck pain and neck stiffness.  Skin: Negative for itching and rash.  Neurological: Negative for dizziness, extremity weakness, gait problem, headaches, light-headedness and seizures.  Hematological: Negative for adenopathy. Does not bruise/bleed easily.  Psychiatric/Behavioral: Negative for confusion, depression and sleep disturbance. The patient is not nervous/anxious.     PHYSICAL EXAMINATION:  There were no vitals taken for this visit.  ECOG PERFORMANCE STATUS: {CHL ONC ECOG [id number]  Physical Exam  Constitutional: Oriented to person, place, and time and well-developed, well-nourished, and in no distress. No distress.  HENT:  Head: Normocephalic and atraumatic.  Mouth/Throat: Oropharynx is clear and moist. No oropharyngeal exudate.  Eyes: Conjunctivae are normal. Right eye exhibits no discharge. Left eye exhibits no discharge. No scleral icterus.  Neck: Normal range of motion. Neck supple.  Cardiovascular: Normal rate, regular rhythm, normal heart sounds and intact distal pulses.   Pulmonary/Chest: Effort normal and breath sounds normal. No respiratory distress. No wheezes. No rales.  Abdominal: Soft. Bowel sounds are normal.  Exhibits no distension and no mass. There is no tenderness.  Musculoskeletal: Normal range of motion. Exhibits no edema.  Lymphadenopathy:    No cervical adenopathy.  Neurological: Alert and oriented to person, place, and time. Exhibits normal muscle tone. Gait normal. Coordination normal.  Skin: Skin is warm and dry. No rash noted. Not diaphoretic. No erythema. No pallor.  Psychiatric: Mood, memory and judgment normal.  Vitals reviewed.  LABORATORY DATA: Lab Results  Component Value Date   WBC 6.6 04/18/2024   HGB 14.8 04/18/2024   HCT 48.6 (H) 04/18/2024   MCV 85.3 04/18/2024   PLT 216 04/18/2024      Chemistry      Component Value Date/Time   NA 141 04/18/2024 1126   NA 142 06/11/2021 1035   K 4.3 04/18/2024 1126   CL 106 04/18/2024 1126   CO2 25 04/18/2024 1126   BUN 15 04/18/2024 1126   BUN 15 06/11/2021 1035   CREATININE 1.02 (H) 04/18/2024 1126   GLU 93 04/02/2016 0000      Component Value Date/Time   CALCIUM 9.6 04/18/2024 1126   ALKPHOS 68 02/17/2023 1023   AST 18 04/18/2024 1126   AST 22 02/17/2023 1023   ALT 11 04/18/2024 1126   ALT 13 02/17/2023 1023   BILITOT 0.6 04/18/2024 1126   BILITOT 0.7 02/17/2023 1023  RADIOGRAPHIC STUDIES:  No results found.   ASSESSMENT/PLAN:  This is a very pleasant 83 year old Caucasian female diagnosed with polycythemia vera with a positive JAK2 mutation.  She receives phlebotomies on an as-needed basis.  Her labs today show a hematocrit of ***.  Therefore she will proceed with phlebotomy as planned.  We will see her for labs and follow-up visit in ***months.  Will continue taking 81 mg aspirin .  The patient was advised to call immediately if she has any concerning symptoms in the interval. The patient voices understanding of current disease status and treatment options and is in agreement with the current care plan. All questions were answered. The patient knows to call the clinic with any problems,  questions or concerns. We can certainly see the patient much sooner if necessary   No orders of the defined types were placed in this encounter.    I spent {CHL ONC TIME VISIT - DTPQU:[id number]} counseling the patient face to face. The total time spent in the appointment was {CHL ONC TIME VISIT - DTPQU:[id number]}.  Chirstine Defrain L Keller Bounds, PA-C 05/04/24

## 2024-05-06 ENCOUNTER — Telehealth: Payer: Self-pay | Admitting: Internal Medicine

## 2024-05-06 NOTE — Telephone Encounter (Signed)
 The patient left a voicemail to give her a call back. I attempted to call the patient back but was unable to leave a voicemail.

## 2024-05-09 ENCOUNTER — Inpatient Hospital Stay: Admitting: Physician Assistant

## 2024-05-09 ENCOUNTER — Telehealth: Payer: Self-pay | Admitting: Medical Oncology

## 2024-05-09 ENCOUNTER — Other Ambulatory Visit

## 2024-05-09 ENCOUNTER — Encounter

## 2024-05-09 NOTE — Telephone Encounter (Signed)
 Pt contacted office to cancel her appt this am. Appt cancelled.

## 2024-05-10 DIAGNOSIS — L4052 Psoriatic arthritis mutilans: Secondary | ICD-10-CM | POA: Diagnosis not present

## 2024-05-10 DIAGNOSIS — I70203 Unspecified atherosclerosis of native arteries of extremities, bilateral legs: Secondary | ICD-10-CM | POA: Diagnosis not present

## 2024-05-10 DIAGNOSIS — L84 Corns and callosities: Secondary | ICD-10-CM | POA: Diagnosis not present

## 2024-05-10 DIAGNOSIS — M19079 Primary osteoarthritis, unspecified ankle and foot: Secondary | ICD-10-CM | POA: Diagnosis not present

## 2024-05-10 DIAGNOSIS — I739 Peripheral vascular disease, unspecified: Secondary | ICD-10-CM | POA: Diagnosis not present

## 2024-05-10 DIAGNOSIS — B351 Tinea unguium: Secondary | ICD-10-CM | POA: Diagnosis not present

## 2024-05-10 DIAGNOSIS — M792 Neuralgia and neuritis, unspecified: Secondary | ICD-10-CM | POA: Diagnosis not present

## 2024-05-12 DIAGNOSIS — M47812 Spondylosis without myelopathy or radiculopathy, cervical region: Secondary | ICD-10-CM | POA: Diagnosis not present

## 2024-05-17 ENCOUNTER — Other Ambulatory Visit: Payer: Self-pay

## 2024-05-17 DIAGNOSIS — L4050 Arthropathic psoriasis, unspecified: Secondary | ICD-10-CM

## 2024-05-17 MED ORDER — HYDROCODONE-ACETAMINOPHEN 5-325 MG PO TABS: 1.0000 | ORAL_TABLET | Freq: Every day | ORAL | 0 refills | Status: DC

## 2024-05-17 NOTE — Telephone Encounter (Signed)
 Patient is requesting a refill of the following medications: Requested Prescriptions   Pending Prescriptions Disp Refills   HYDROcodone -acetaminophen  (NORCO/VICODIN) 5-325 MG tablet 30 tablet 0    Sig: Take 1 tablet by mouth daily.    Date of last refill: 04/18/24  Refill amount: 30  Treatment agreement date: May 2025

## 2024-06-03 ENCOUNTER — Ambulatory Visit: Admitting: Neurology

## 2024-06-13 ENCOUNTER — Other Ambulatory Visit: Payer: Self-pay | Admitting: Nurse Practitioner

## 2024-06-17 ENCOUNTER — Telehealth: Payer: Self-pay

## 2024-06-17 ENCOUNTER — Other Ambulatory Visit: Payer: Self-pay | Admitting: Nurse Practitioner

## 2024-06-17 DIAGNOSIS — L4050 Arthropathic psoriasis, unspecified: Secondary | ICD-10-CM

## 2024-06-17 DIAGNOSIS — R42 Dizziness and giddiness: Secondary | ICD-10-CM

## 2024-06-17 NOTE — Telephone Encounter (Signed)
 Patient would like a call back too  Copied from CRM #[id number]. Topic: Clinical - Medication Refill >> Jun 17, 2024  3:21 PM Carrielelia G wrote: Medication: HYDROcodone -acetaminophen  (NORCO/VICODIN) 5-325 MG tablet  Has the patient contacted their pharmacy? No (Agent: If no, request that the patient contact the pharmacy for the refill. If patient does not wish to contact the pharmacy document the reason why and proceed with request.) (Agent: If yes, when and what did the pharmacy advise?)  This is the patient's preferred pharmacy:  [id number] - [place], [place] - [address] [place] [address] Phone: [phone] Fax: [phone]  [id number] [place],  - [address] [place] [address] Phone: [phone] Fax: [phone]   Is this the correct pharmacy for this prescription? Yes If no, delete pharmacy and type the correct one.    I Has the patient been seen for an appointment in the last year OR does the patient have an upcoming appointment? Yes  Can we respond through MyChart? No  Agent: Please be advised that Rx refills may take up to 3 business days. We ask that you follow-up with your pharmacy.

## 2024-06-17 NOTE — Telephone Encounter (Signed)
 Copied from CRM [id number]. Topic: General - Other >> Jun 17, 2024  8:39 AM Miquel SAILOR wrote: Reason for CRM: Patient returning call. Would nod give reason only to be transferred to office. Called and transferred

## 2024-06-17 NOTE — Telephone Encounter (Signed)
 Patient was placed on a brief hold while I attended to another responsibility. When I pickup up the phone and asked how I could assist her she hung the phone up.

## 2024-06-20 MED ORDER — MECLIZINE HCL 12.5 MG PO TABS
12.5000 mg | ORAL_TABLET | Freq: Three times a day (TID) | ORAL | 0 refills | Status: AC | PRN
Start: 2024-06-20 — End: ?

## 2024-06-20 MED ORDER — HYDROCODONE-ACETAMINOPHEN 5-325 MG PO TABS: 1.0000 | ORAL_TABLET | Freq: Every day | ORAL | 0 refills | Status: AC

## 2024-06-20 NOTE — Telephone Encounter (Signed)
 Patient requesting refill.  Epic LR: 05/17/2024 Contract Date: 03/15/2024  Pended Rx and sent to [facility] for approval.

## 2024-06-20 NOTE — Telephone Encounter (Signed)
 Patient called requesting refill on her Hydrocodone  and Meclizine .  Pended and sent to [facility] for approval.

## 2024-06-21 NOTE — Telephone Encounter (Signed)
 Patient concerns addressed 06/20/2024.

## 2024-07-04 DIAGNOSIS — G720 Drug-induced myopathy: Secondary | ICD-10-CM | POA: Diagnosis not present

## 2024-07-04 DIAGNOSIS — D751 Secondary polycythemia: Secondary | ICD-10-CM | POA: Diagnosis not present

## 2024-07-04 DIAGNOSIS — G8929 Other chronic pain: Secondary | ICD-10-CM | POA: Diagnosis not present

## 2024-07-04 DIAGNOSIS — T466X5A Adverse effect of antihyperlipidemic and antiarteriosclerotic drugs, initial encounter: Secondary | ICD-10-CM | POA: Diagnosis not present

## 2024-07-04 DIAGNOSIS — L4050 Arthropathic psoriasis, unspecified: Secondary | ICD-10-CM | POA: Diagnosis not present

## 2024-07-04 DIAGNOSIS — G4733 Obstructive sleep apnea (adult) (pediatric): Secondary | ICD-10-CM | POA: Diagnosis not present

## 2024-07-04 DIAGNOSIS — E782 Mixed hyperlipidemia: Secondary | ICD-10-CM | POA: Diagnosis not present

## 2024-07-04 DIAGNOSIS — E039 Hypothyroidism, unspecified: Secondary | ICD-10-CM | POA: Diagnosis not present

## 2024-07-21 DIAGNOSIS — I739 Peripheral vascular disease, unspecified: Secondary | ICD-10-CM | POA: Diagnosis not present

## 2024-07-21 DIAGNOSIS — M19079 Primary osteoarthritis, unspecified ankle and foot: Secondary | ICD-10-CM | POA: Diagnosis not present

## 2024-07-21 DIAGNOSIS — M792 Neuralgia and neuritis, unspecified: Secondary | ICD-10-CM | POA: Diagnosis not present

## 2024-07-21 DIAGNOSIS — I70203 Unspecified atherosclerosis of native arteries of extremities, bilateral legs: Secondary | ICD-10-CM | POA: Diagnosis not present

## 2024-07-21 DIAGNOSIS — L84 Corns and callosities: Secondary | ICD-10-CM | POA: Diagnosis not present

## 2024-07-21 DIAGNOSIS — L4052 Psoriatic arthritis mutilans: Secondary | ICD-10-CM | POA: Diagnosis not present

## 2024-07-21 DIAGNOSIS — B351 Tinea unguium: Secondary | ICD-10-CM | POA: Diagnosis not present

## 2024-07-27 DIAGNOSIS — G894 Chronic pain syndrome: Secondary | ICD-10-CM | POA: Diagnosis not present

## 2024-07-27 DIAGNOSIS — M1712 Unilateral primary osteoarthritis, left knee: Secondary | ICD-10-CM | POA: Diagnosis not present

## 2024-07-27 DIAGNOSIS — Z79891 Long term (current) use of opiate analgesic: Secondary | ICD-10-CM | POA: Diagnosis not present

## 2024-07-27 DIAGNOSIS — M1711 Unilateral primary osteoarthritis, right knee: Secondary | ICD-10-CM | POA: Diagnosis not present

## 2024-07-27 DIAGNOSIS — M542 Cervicalgia: Secondary | ICD-10-CM | POA: Diagnosis not present

## 2024-07-27 DIAGNOSIS — M5030 Other cervical disc degeneration, unspecified cervical region: Secondary | ICD-10-CM | POA: Diagnosis not present

## 2024-08-15 DIAGNOSIS — M170 Bilateral primary osteoarthritis of knee: Secondary | ICD-10-CM | POA: Diagnosis not present

## 2024-08-15 DIAGNOSIS — M25562 Pain in left knee: Secondary | ICD-10-CM | POA: Diagnosis not present

## 2024-08-16 ENCOUNTER — Other Ambulatory Visit (HOSPITAL_COMMUNITY): Payer: Self-pay | Admitting: Physician Assistant

## 2024-08-16 DIAGNOSIS — M25561 Pain in right knee: Secondary | ICD-10-CM

## 2024-08-20 ENCOUNTER — Emergency Department (HOSPITAL_BASED_OUTPATIENT_CLINIC_OR_DEPARTMENT_OTHER)

## 2024-08-20 ENCOUNTER — Encounter (HOSPITAL_BASED_OUTPATIENT_CLINIC_OR_DEPARTMENT_OTHER): Payer: Self-pay

## 2024-08-20 ENCOUNTER — Emergency Department (HOSPITAL_BASED_OUTPATIENT_CLINIC_OR_DEPARTMENT_OTHER)
Admission: EM | Admit: 2024-08-20 | Discharge: 2024-08-20 | Disposition: A | Discharge status: Home / Self Care | Attending: Emergency Medicine | Admitting: Emergency Medicine

## 2024-08-20 ENCOUNTER — Other Ambulatory Visit: Payer: Self-pay

## 2024-08-20 DIAGNOSIS — W19XXXA Unspecified fall, initial encounter: Secondary | ICD-10-CM

## 2024-08-20 DIAGNOSIS — W08XXXA Fall from other furniture, initial encounter: Secondary | ICD-10-CM | POA: Diagnosis not present

## 2024-08-20 DIAGNOSIS — S43401A Unspecified sprain of right shoulder joint, initial encounter: Secondary | ICD-10-CM | POA: Diagnosis not present

## 2024-08-20 DIAGNOSIS — S42034A Nondisplaced fracture of lateral end of right clavicle, initial encounter for closed fracture: Secondary | ICD-10-CM | POA: Diagnosis not present

## 2024-08-20 DIAGNOSIS — R030 Elevated blood-pressure reading, without diagnosis of hypertension: Secondary | ICD-10-CM | POA: Insufficient documentation

## 2024-08-20 DIAGNOSIS — S40011A Contusion of right shoulder, initial encounter: Secondary | ICD-10-CM

## 2024-08-20 DIAGNOSIS — S42001A Fracture of unspecified part of right clavicle, initial encounter for closed fracture: Secondary | ICD-10-CM

## 2024-08-20 DIAGNOSIS — Z7982 Long term (current) use of aspirin: Secondary | ICD-10-CM | POA: Diagnosis not present

## 2024-08-20 DIAGNOSIS — M25511 Pain in right shoulder: Secondary | ICD-10-CM | POA: Diagnosis not present

## 2024-08-20 DIAGNOSIS — I10 Essential (primary) hypertension: Secondary | ICD-10-CM | POA: Diagnosis not present

## 2024-08-20 MED ORDER — ACETAMINOPHEN 325 MG PO TABS
650.0000 mg | ORAL_TABLET | Freq: Once | ORAL | Status: AC
  Administered 2024-08-20: 650 mg via ORAL
  Filled 2024-08-20: qty 2

## 2024-08-20 NOTE — Discharge Instructions (Addendum)
 It was our pleasure to provide your ER care today - we hope that you feel better. On your xrays, there is a right clavicle fracture (age indeterminate on xray) - wear sling/shoulder immobilizer, icepack to sore area -  you can also have other soft tissues such as having rotator cuff strain, labrum injury, etc.  Fall precautions.   Take acetaminophen  as need.   Follow up closely with orthopedist in one week - call office Monday AM to arrange appointment.   Return to ER if worse, new symptoms, new/severe pain, severe headache, numbness/weakness, loss of normal functional ability, chest pain, trouble breathing, or other concern.

## 2024-08-20 NOTE — ED Triage Notes (Addendum)
 Arrives ambulatory to the ED with complaints of falling off a stool last night, injuring her right shoulder. She did hit her head. No LOC or blood thinners.  Patient states that the stool seat was loose. Rates pain a 6/10. Pain worsens with movement.

## 2024-08-20 NOTE — ED Provider Notes (Addendum)
 [place] EMERGENCY DEPARTMENT AT [facility] Provider Note   CSN: [id number] Arrival date & time: 08/20/24  1055     Patient presents with: Fall and Shoulder Pain (right)   Sydney Reid is a 83 y.o. female.   Pt s/p mechanical fall this AM. Was sitting in stool that was partially broken/seat loose, and fell back and to right hitting right shoulder. C/o right shoulder pain, constant, dull, non radiating.  No radicular pain, no numbness/weakness.  Felt fine, asymptomatic this AM, including just prior to fall. No faintness or dizziness. No loc w fall. No head injury or headache. No neck or back pain. No elbow or wrist pain. No other extremity pain or injury. Was able to get up under own power and has been ambulatory post fall. No anticoagulant use.   The history is provided by the patient.  Fall Pertinent negatives include no chest pain, no abdominal pain, no headaches and no shortness of breath.  Shoulder Pain Associated symptoms: no back pain and no fever        Prior to Admission medications   Medication Sig Start Date End Date Taking? Authorizing Provider  aspirin  EC 81 MG tablet Take 81 mg by mouth daily. Swallow whole.    [provider]  Cholecalciferol (VITAMIN D3) 250 MCG (10000 UT) capsule Take 4,000 Units by mouth daily.    [provider]  etanercept (ENBREL) 50 MG/ML injection Inject 50 mg into the skin every Sunday.     [provider]  ezetimibe  (ZETIA ) 10 MG tablet TAKE 1 TABLET BY MOUTH DAILY 01/11/24   Eubanks, Jessica K, NP  fluocinonide ointment (LIDEX) 0.05 % Apply 1 Application topically daily as needed. 02/11/23   [provider]  HYDROcodone -acetaminophen  (NORCO/VICODIN) 5-325 MG tablet Take 1 tablet by mouth daily. 06/20/24   [name], NP  levothyroxine  (SYNTHROID ) 75 MCG tablet TAKE 1 TABLET BY MOUTH DAILY  BEFORE BREAKFAST 06/13/24   Eubanks, Jessica K, NP  meclizine  (ANTIVERT ) 12.5 MG tablet Take 1  tablet (12.5 mg total) by mouth 3 (three) times daily as needed for dizziness. 06/20/24   [name], NP    Allergies: Codeine, Statins, and Atorvastatin    Review of Systems  Constitutional:  Negative for fever.  Respiratory:  Negative for shortness of breath.   Cardiovascular:  Negative for chest pain.  Gastrointestinal:  Negative for abdominal pain, nausea and vomiting.  Musculoskeletal:  Negative for back pain and neck stiffness.  Skin:  Negative for wound.  Neurological:  Negative for weakness, numbness and headaches.    Updated Vital Signs BP (!) 156/79   Pulse 81   Temp 98.6 F (37 C) (Oral)   Resp 20   Ht 1.626 m (5' 4)   Wt 64.4 kg   SpO2 98%   BMI 24.37 kg/m   Physical Exam Vitals and nursing note reviewed.  Constitutional:      Appearance: Normal appearance. She is well-developed.  HENT:     Head: Atraumatic.     Nose: Nose normal.     Mouth/Throat:     Mouth: Mucous membranes are moist.  Eyes:     General: No scleral icterus.    Conjunctiva/sclera: Conjunctivae normal.     Pupils: Pupils are equal, round, and reactive to light.  Neck:     Trachea: No tracheal deviation.  Cardiovascular:     Rate and Rhythm: Normal rate and regular rhythm.     Pulses: Normal pulses.  Heart sounds: Normal heart sounds. No murmur heard.    No friction rub. No gallop.  Pulmonary:     Effort: Pulmonary effort is normal. No respiratory distress.     Breath sounds: Normal breath sounds.  Chest:     Chest wall: No tenderness.  Abdominal:     General: There is no distension.     Palpations: Abdomen is soft.     Tenderness: There is no abdominal tenderness.  Musculoskeletal:        General: No swelling.     Cervical back: Normal range of motion and neck supple. No tenderness. No muscular tenderness.     Right lower leg: No edema.     Left lower leg: No edema.     Comments: CTLS spine, non tender, aligned, no step off. Tenderness right shoulder, otherwise good  rom bil extremities without pain or focal bony tenderness.   Skin:    General: Skin is warm and dry.     Findings: No rash.  Neurological:     Mental Status: She is alert.     Comments: Alert, speech normal. Motor/sens grossly intact bil. RUE nvi. Steady gait.   Psychiatric:        Mood and Affect: Mood normal.     (all labs ordered are listed, but only abnormal results are displayed) Labs Reviewed - No data to display  EKG: None  Radiology: DG Shoulder Right Result Date: 08/20/2024 EXAM: 1 VIEW XRAY OF THE RIGHT SHOULDER 08/20/2024 11:29:00 AM COMPARISON: None available. CLINICAL HISTORY: Fall, pain. Table formatting from the original note was not included. Per triage: Arrives ambulatory to the ED with complaints of falling off a stool last night, injuring her right shoulder. She did hit her head. No LOC or blood thinners. Patient states that the stool seat was loose. Rates pain a 6/10. Pain worsens with movement. Pt states she has not had any previous injuries to right shoulder. Pt stated she had trouble putting her socks and shoes on, she felt that her right shoulder has been weaker since fall. Pain does not radiate. FINDINGS: BONES AND JOINTS: Glenohumeral joint is normally aligned. No acute fracture or dislocation. The [facility] joint is unremarkable in appearance. Old healed fracture of the distal right clavicle. SOFT TISSUES: Aortic valve stent in place. Atherosclerotic calcifications of the aorta. Visualized lung is unremarkable. IMPRESSION: 1. No acute fracture or dislocation. Electronically signed by: Waddell Calk MD 08/20/2024 11:53 AM EDT RP Workstation: [id number]     Procedures   Medications Ordered in the ED  acetaminophen  (TYLENOL ) tablet 650 mg (650 mg Oral Given 08/20/24 1231)                                    Medical Decision Making Problems Addressed: Accidental fall, initial encounter: acute illness or injury with systemic symptoms that poses a threat to life or  bodily functions Closed nondisplaced fracture of right clavicle, unspecified part of clavicle, initial encounter: acute illness or injury Contusion of right shoulder, initial encounter: acute illness or injury Elevated blood pressure reading: acute illness or injury Sprain of right shoulder, unspecified shoulder sprain type, initial encounter: acute illness or injury  Amount and/or Complexity of Data Reviewed Independent Historian:     Details: Family/friend External Data Reviewed: notes. Radiology: ordered and independent interpretation performed. Decision-making details documented in ED Course.  Risk OTC drugs.   Imaging ordered.   Reviewed nursing  notes and prior charts for additional history.   Xrays reviewed/interpreted by me - ?clavicle fx.  Radiology indicates fx is old/healed. Pt denies any prior history clavicle fx or injury, and is tender in that area. Will given sling, ortho f/u.   Pt refuses ct head/neck, does not want to stay for imaging or having imaging done.   Acetaminophen  po. Discussed possible soft tissue injury, rotator strain/tear, labrum injury, etc.  Rec pcp/ortho f/u.  Return precautions provided.          Final diagnoses:  Accidental fall, initial encounter  Sprain of right shoulder, unspecified shoulder sprain type, initial encounter  Contusion of right shoulder, initial encounter  Elevated blood pressure reading  Closed nondisplaced fracture of right clavicle, unspecified part of clavicle, initial encounter    ED Discharge Orders     None          Bernard Drivers, MD 08/20/24 1240    Bernard Drivers, MD 08/20/24 1304

## 2024-08-20 NOTE — ED Notes (Signed)
 Reviewed AVS/discharge instructions with patient. Time allotted for and all questions answered. Patient is agreeable for d/c and escorted to ED exit by staff.

## 2024-08-20 NOTE — ED Notes (Signed)
 Patient transported to CT

## 2024-08-20 NOTE — ED Notes (Signed)
 Pt is unable to use her hands (pinching motion) to pull off Velcro of the shoulder immobilizer, with tab positioned by her frontal neck region  Pt lives independently and have no other family around her at this time. Family in room returns home tomorrow. EDP is aware.

## 2024-08-20 NOTE — ED Notes (Signed)
 X-ray at bedside.

## 2024-08-23 ENCOUNTER — Encounter: Payer: Medicare Other | Admitting: Nurse Practitioner

## 2024-08-24 ENCOUNTER — Encounter (HOSPITAL_COMMUNITY)

## 2024-08-24 DIAGNOSIS — S42001A Fracture of unspecified part of right clavicle, initial encounter for closed fracture: Secondary | ICD-10-CM | POA: Diagnosis not present

## 2024-08-30 DIAGNOSIS — E782 Mixed hyperlipidemia: Secondary | ICD-10-CM | POA: Diagnosis not present

## 2024-08-30 DIAGNOSIS — M5459 Other low back pain: Secondary | ICD-10-CM | POA: Diagnosis not present

## 2024-08-30 DIAGNOSIS — G4733 Obstructive sleep apnea (adult) (pediatric): Secondary | ICD-10-CM | POA: Diagnosis not present

## 2024-08-30 DIAGNOSIS — L4050 Arthropathic psoriasis, unspecified: Secondary | ICD-10-CM | POA: Diagnosis not present

## 2024-08-30 DIAGNOSIS — D751 Secondary polycythemia: Secondary | ICD-10-CM | POA: Diagnosis not present

## 2024-08-30 DIAGNOSIS — E039 Hypothyroidism, unspecified: Secondary | ICD-10-CM | POA: Diagnosis not present

## 2024-09-05 DIAGNOSIS — D751 Secondary polycythemia: Secondary | ICD-10-CM | POA: Diagnosis not present

## 2024-09-05 DIAGNOSIS — E782 Mixed hyperlipidemia: Secondary | ICD-10-CM | POA: Diagnosis not present

## 2024-09-05 DIAGNOSIS — E039 Hypothyroidism, unspecified: Secondary | ICD-10-CM | POA: Diagnosis not present

## 2024-09-05 DIAGNOSIS — L4050 Arthropathic psoriasis, unspecified: Secondary | ICD-10-CM | POA: Diagnosis not present

## 2024-09-05 DIAGNOSIS — M545 Low back pain, unspecified: Secondary | ICD-10-CM | POA: Diagnosis not present

## 2024-09-05 DIAGNOSIS — G4733 Obstructive sleep apnea (adult) (pediatric): Secondary | ICD-10-CM | POA: Diagnosis not present

## 2024-09-05 DIAGNOSIS — G720 Drug-induced myopathy: Secondary | ICD-10-CM | POA: Diagnosis not present

## 2024-09-05 DIAGNOSIS — G8929 Other chronic pain: Secondary | ICD-10-CM | POA: Diagnosis not present

## 2024-09-05 DIAGNOSIS — Z0000 Encounter for general adult medical examination without abnormal findings: Secondary | ICD-10-CM | POA: Diagnosis not present

## 2024-09-05 DIAGNOSIS — T466X5A Adverse effect of antihyperlipidemic and antiarteriosclerotic drugs, initial encounter: Secondary | ICD-10-CM | POA: Diagnosis not present

## 2024-09-06 ENCOUNTER — Ambulatory Visit (HOSPITAL_COMMUNITY)
Admission: RE | Admit: 2024-09-06 | Discharge: 2024-09-06 | Disposition: A | Discharge status: Home / Self Care | Source: Ambulatory Visit | Attending: Physician Assistant | Admitting: Physician Assistant

## 2024-09-06 DIAGNOSIS — M25561 Pain in right knee: Secondary | ICD-10-CM | POA: Diagnosis not present

## 2024-09-06 DIAGNOSIS — Z471 Aftercare following joint replacement surgery: Secondary | ICD-10-CM | POA: Diagnosis not present

## 2024-09-06 DIAGNOSIS — R531 Weakness: Secondary | ICD-10-CM | POA: Diagnosis not present

## 2024-09-06 DIAGNOSIS — Z96651 Presence of right artificial knee joint: Secondary | ICD-10-CM | POA: Diagnosis not present

## 2024-09-06 DIAGNOSIS — Z96641 Presence of right artificial hip joint: Secondary | ICD-10-CM | POA: Diagnosis not present

## 2024-09-06 MED ORDER — TECHNETIUM TC 99M MEDRONATE IV KIT
20.0000 | PACK | Freq: Once | INTRAVENOUS | Status: AC | PRN
  Administered 2024-09-06: 21.5 via INTRAVENOUS

## 2024-09-14 DIAGNOSIS — M1711 Unilateral primary osteoarthritis, right knee: Secondary | ICD-10-CM | POA: Diagnosis not present

## 2024-09-14 DIAGNOSIS — M542 Cervicalgia: Secondary | ICD-10-CM | POA: Diagnosis not present

## 2024-09-14 DIAGNOSIS — M5441 Lumbago with sciatica, right side: Secondary | ICD-10-CM | POA: Diagnosis not present

## 2024-09-14 DIAGNOSIS — M1712 Unilateral primary osteoarthritis, left knee: Secondary | ICD-10-CM | POA: Diagnosis not present

## 2024-09-14 DIAGNOSIS — M5442 Lumbago with sciatica, left side: Secondary | ICD-10-CM | POA: Diagnosis not present

## 2024-09-14 DIAGNOSIS — G894 Chronic pain syndrome: Secondary | ICD-10-CM | POA: Diagnosis not present

## 2024-09-14 DIAGNOSIS — M5030 Other cervical disc degeneration, unspecified cervical region: Secondary | ICD-10-CM | POA: Diagnosis not present

## 2024-09-14 DIAGNOSIS — Z79891 Long term (current) use of opiate analgesic: Secondary | ICD-10-CM | POA: Diagnosis not present

## 2024-09-15 DIAGNOSIS — D751 Secondary polycythemia: Secondary | ICD-10-CM | POA: Diagnosis not present

## 2024-09-15 DIAGNOSIS — Z79899 Other long term (current) drug therapy: Secondary | ICD-10-CM | POA: Diagnosis not present

## 2024-09-15 DIAGNOSIS — M8580 Other specified disorders of bone density and structure, unspecified site: Secondary | ICD-10-CM | POA: Diagnosis not present

## 2024-09-15 DIAGNOSIS — M1990 Unspecified osteoarthritis, unspecified site: Secondary | ICD-10-CM | POA: Diagnosis not present

## 2024-09-15 DIAGNOSIS — M549 Dorsalgia, unspecified: Secondary | ICD-10-CM | POA: Diagnosis not present

## 2024-09-15 DIAGNOSIS — Z23 Encounter for immunization: Secondary | ICD-10-CM | POA: Diagnosis not present

## 2024-09-15 DIAGNOSIS — M1712 Unilateral primary osteoarthritis, left knee: Secondary | ICD-10-CM | POA: Diagnosis not present

## 2024-09-15 DIAGNOSIS — L409 Psoriasis, unspecified: Secondary | ICD-10-CM | POA: Diagnosis not present

## 2024-09-15 DIAGNOSIS — M2550 Pain in unspecified joint: Secondary | ICD-10-CM | POA: Diagnosis not present

## 2024-09-15 DIAGNOSIS — L4050 Arthropathic psoriasis, unspecified: Secondary | ICD-10-CM | POA: Diagnosis not present

## 2024-09-27 DIAGNOSIS — M545 Low back pain, unspecified: Secondary | ICD-10-CM | POA: Diagnosis not present

## 2024-09-27 DIAGNOSIS — M5459 Other low back pain: Secondary | ICD-10-CM | POA: Diagnosis not present

## 2024-10-13 ENCOUNTER — Telehealth: Payer: Self-pay

## 2024-10-13 NOTE — Telephone Encounter (Signed)
 Copied from CRM #[id number]. Topic: Appointments - Appointment Cancel/Reschedule >> Oct 13, 2024  9:33 AM Diannia H wrote: Patient/patient representative is calling to cancel or reschedule an appointment. She is cancelling her appointment due to the new phone system. She likes her doctor but because of the phone system she has found a new provider.

## 2024-10-13 NOTE — Telephone Encounter (Signed)
 This information was shared with leadership

## 2024-10-17 ENCOUNTER — Ambulatory Visit: Payer: Self-pay | Admitting: Nurse Practitioner

## 2024-11-16 ENCOUNTER — Encounter (HOSPITAL_BASED_OUTPATIENT_CLINIC_OR_DEPARTMENT_OTHER): Payer: Self-pay | Admitting: Physical Therapy

## 2024-11-22 ENCOUNTER — Encounter (HOSPITAL_BASED_OUTPATIENT_CLINIC_OR_DEPARTMENT_OTHER): Payer: Self-pay

## 2024-11-22 ENCOUNTER — Ambulatory Visit (HOSPITAL_BASED_OUTPATIENT_CLINIC_OR_DEPARTMENT_OTHER): Admitting: Physical Therapy

## 2024-11-22 NOTE — Therapy (Incomplete)
 " OUTPATIENT PHYSICAL THERAPY THORACOLUMBAR EVALUATION   Patient Name: Sydney Reid MRN: [id number] DOB:1941-04-16, 84 y.o., female Today's Date: 11/22/2024  END OF SESSION:   Past Medical History:  Diagnosis Date   Complication of anesthesia    Difficult intubation    20-25 years ago   H/O total knee replacement 10/28/1999   Right   High cholesterol    History of tonsillectomy and adenoidectomy 10/27/1944   History of transcatheter aortic valve replacement (TAVR) 08/06/2021   s/p Edwards 23mm S3 vis TF approach with Dr. Wonda and Dr. Lucas   Psoriatic arthritis [facility]) 10/27/1988   Sleep apnea with use of continuous positive airway pressure (CPAP) 10/27/2002   Thyroid  disease    Past Surgical History:  Procedure Laterality Date   CARDIAC CATHETERIZATION     CATARACT EXTRACTION W/ INTRAOCULAR LENS IMPLANT Bilateral    FOOT SURGERY     fused arch in left foot   GROIN DISSECTION  08/06/2021   Procedure: QUILLIAN EXPLORATION ;  Surgeon: Sheree Penne Bruckner, MD;  Location: [facility] OR;  Service: Vascular;;   INTRAOPERATIVE TRANSTHORACIC ECHOCARDIOGRAM N/A 08/06/2021   Procedure: INTRAOPERATIVE TRANSTHORACIC ECHOCARDIOGRAM;  Surgeon: Wendel Lurena POUR, MD;  Location: [facility] OR;  Service: Cardiovascular;  Laterality: N/A;   LOWER EXTREMITY ANGIOGRAM  08/06/2021   Procedure: LEFT LOWER EXTREMITY ANGIOGRAM;  Surgeon: Wendel Lurena POUR, MD;  Location: [facility] OR;  Service: Cardiovascular;;   PATCH ANGIOPLASTY  08/06/2021   Procedure: PATCH ANGIOPLASTY OF FEMORAL ARTERY USING LEFT GREATER SAPHENOUS VEIN;  Surgeon: Sheree Penne Bruckner, MD;  Location: [facility] OR;  Service: Vascular;;   RIGHT/LEFT HEART CATH AND CORONARY ANGIOGRAPHY N/A 04/16/2021   Procedure: RIGHT/LEFT HEART CATH AND CORONARY ANGIOGRAPHY;  Surgeon: Ladona Heinz, MD;  Location: [facility] INVASIVE CV LAB;  Service: Cardiovascular;  Laterality: N/A;   SPINE SURGERY     TONSILLECTOMY     TOTAL KNEE ARTHROPLASTY Left    TRANSCATHETER AORTIC  VALVE REPLACEMENT, TRANSFEMORAL N/A 08/06/2021   Procedure: TRANSCATHETER AORTIC VALVE REPLACEMENT, TRANSFEMORAL;  Surgeon: Wendel Lurena POUR, MD;  Location: [facility] OR;  Service: Cardiovascular;  Laterality: N/A;   Patient Active Problem List   Diagnosis Date Noted   Current moderate episode of major depressive disorder without prior episode (HCC) 04/18/2024   Stage 3a chronic kidney disease (HCC) 04/18/2024   Spinal stenosis, cervical region 04/18/2024   Tinnitus of both ears 04/08/2024   Epiretinal membrane (ERM) of right eye 08/28/2021   Fuchs' corneal dystrophy 08/28/2021   Peripheral vascular disease 08/28/2021   Cataract, bilateral 08/28/2021   Unspecified atherosclerosis of native arteries of extremities, bilateral legs 08/28/2021   S/P TAVR (transcatheter aortic valve replacement) 08/06/2021   Asymptomatic bilateral carotid artery stenosis 05/09/2021   Severe aortic stenosis 04/16/2021   Polycythemia vera (HCC) 02/14/2021   Osteopenia 01/02/2021   Pain in right knee 07/04/2020   Hypothyroidism due to acquired atrophy of thyroid  05/12/2018   Dizziness 05/12/2018   Closed fracture of proximal end of left humerus 02/25/2018   Chronic pain syndrome 07/08/2017   Psoriatic arthritis, 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/08/2017   Hyperlipidemia 07/14/2016   Dermatochalasis of both upper eyelids 08/31/2014   Left foot pain 10/04/2012   Lumbosacral spondylosis without myelopathy 08/07/2011   Arthropathy of cervical spine 07/19/2011   Degenerative joint disease of cervical spine 07/19/2011   Hypothyroidism 06/20/2010   Psoriatic arthritis (HCC) 06/20/2010   OSA treated with BiPAP 06/20/2010   Sialoadenitis 03/04/2007    PCP: ***  REFERRING PROVIDER: Donaciano sprang MD  REFERRING DIAG: M84.48XA (ICD-10-CM) - Pathological fracture, other site, initial encounter for fracture   Rationale for Evaluation and Treatment: Rehabilitation  THERAPY DIAG:  No diagnosis found.  ONSET DATE:  ***  SUBJECTIVE:                                                                                                                                                                                           SUBJECTIVE STATEMENT: ***  PERTINENT HISTORY:  Pathological fracture of sacaral vertebra   PAIN:  Are you having pain? Yes: NPRS scale: *** Pain location: *** Pain description: *** Aggravating factors: *** Relieving factors: ***  PRECAUTIONS: {Therapy precautions:24002}  RED FLAGS: {PT Red Flags:29287}   WEIGHT BEARING RESTRICTIONS: No  FALLS:  Has patient fallen in last 6 months? {fallsyesno:27318}  LIVING ENVIRONMENT: Lives with: {OPRC lives with:25569::lives with their family} Lives in: {Lives in:25570} Stairs: {opstairs:27293} Has following equipment at home: {Assistive devices:23999}  OCCUPATION: ***  PLOF: {PLOF:24004}  PATIENT GOALS: ***  NEXT MD VISIT: ***  OBJECTIVE:  Note: Objective measures were completed at Evaluation unless otherwise noted.  DIAGNOSTIC FINDINGS:  ***  PATIENT SURVEYS:  {rehab surveys:24030}  COGNITION: Overall cognitive status: {cognition:24006}     SENSATION: {sensation:27233}  MUSCLE LENGTH: Hamstrings: Right *** deg; Left *** deg Debby test: Right *** deg; Left *** deg  POSTURE: {posture:25561}  PALPATION: ***  LUMBAR ROM:   AROM eval  Flexion   Extension   Right lateral flexion   Left lateral flexion   Right rotation   Left rotation    (Blank rows = not tested)  LOWER EXTREMITY ROM:     {AROM/PROM:27142}  Right eval Left eval  Hip flexion    Hip extension    Hip abduction    Hip adduction    Hip internal rotation    Hip external rotation    Knee flexion    Knee extension    Ankle dorsiflexion    Ankle plantarflexion    Ankle inversion    Ankle eversion     (Blank rows = not tested)  LOWER EXTREMITY MMT:    MMT Right eval Left eval  Hip flexion    Hip extension    Hip abduction     Hip adduction    Hip internal rotation    Hip external rotation    Knee flexion    Knee extension    Ankle dorsiflexion    Ankle plantarflexion    Ankle inversion    Ankle eversion     (Blank rows = not tested)  LUMBAR SPECIAL TESTS:  {lumbar special test:25242}  FUNCTIONAL TESTS:  {Functional tests:24029}  GAIT: Distance walked: *** Assistive device utilized: {Assistive devices:23999} Level of assistance: {Levels of assistance:24026} Comments: ***  TREATMENT Eval Self care:Posture and optometrist instruction                                                                                                                              PATIENT EDUCATION:  Education details: Discussed eval findings, rehab rationale, aquatic program progression/POC and pools in area. Patient is in agreement  Person educated: Patient Education method: Explanation Education comprehension: verbalized understanding  HOME EXERCISE PROGRAM: ***  ASSESSMENT:  CLINICAL IMPRESSION: Patient is a 84 y.o. f who was seen today for physical therapy evaluation and treatment for ***.   OBJECTIVE IMPAIRMENTS: {opptimpairments:25111}.   ACTIVITY LIMITATIONS: {activitylimitations:27494}  PARTICIPATION LIMITATIONS: {participationrestrictions:25113}  PERSONAL FACTORS: {Personal factors:25162} are also affecting patient's functional outcome.   REHAB POTENTIAL: {rehabpotential:25112}  CLINICAL DECISION MAKING: {clinical decision making:25114}  EVALUATION COMPLEXITY: {Evaluation complexity:25115}   GOALS: Goals reviewed with patient? {yes/no:20286}  SHORT TERM GOALS: Target date: ***  *** Baseline: Goal status: INITIAL  2.  *** Baseline:  Goal status: INITIAL  3.  *** Baseline:  Goal status: INITIAL  4.  *** Baseline:  Goal status: INITIAL  5.  *** Baseline:  Goal status: INITIAL  6.  *** Baseline:  Goal status: INITIAL  LONG TERM GOALS: Target date: ***  *** Baseline:   Goal status: INITIAL  2.  *** Baseline:  Goal status: INITIAL  3.  *** Baseline:  Goal status: INITIAL  4.  *** Baseline:  Goal status: INITIAL  5.  *** Baseline:  Goal status: INITIAL  6.  *** Baseline:  Goal status: INITIAL  PLAN:  PT FREQUENCY: {rehab frequency:25116}  PT DURATION: {rehab duration:25117}  PLANNED INTERVENTIONS: {rehab planned interventions:25118::97110-Therapeutic exercises,97530- Therapeutic [phone]- Neuromuscular re-education,97535- Self Rjmz,02859- Manual therapy,Patient/Family education}.  PLAN FOR NEXT SESSION: ***   Frankie Stori Royse, PT 11/22/2024, 10:32 AM  "

## 2024-11-23 ENCOUNTER — Ambulatory Visit (HOSPITAL_BASED_OUTPATIENT_CLINIC_OR_DEPARTMENT_OTHER): Admitting: Physical Therapy

## 2024-12-05 ENCOUNTER — Ambulatory Visit (HOSPITAL_COMMUNITY): Payer: Medicare Other

## 2024-12-21 ENCOUNTER — Ambulatory Visit (HOSPITAL_BASED_OUTPATIENT_CLINIC_OR_DEPARTMENT_OTHER): Admitting: Physical Therapy
